# Patient Record
Sex: Male | Born: 1945 | Race: Black or African American | Hispanic: No | Marital: Married | Smoking: Former smoker
Health system: Southern US, Community
[De-identification: ages and names within clinical notes are randomized; demographics above are authoritative.]

## PROBLEM LIST (undated history)

## (undated) DIAGNOSIS — M25562 Pain in left knee: Secondary | ICD-10-CM

## (undated) DIAGNOSIS — G2581 Restless legs syndrome: Secondary | ICD-10-CM

## (undated) DIAGNOSIS — M25512 Pain in left shoulder: Secondary | ICD-10-CM

## (undated) DIAGNOSIS — G629 Polyneuropathy, unspecified: Secondary | ICD-10-CM

## (undated) DIAGNOSIS — I4891 Unspecified atrial fibrillation: Secondary | ICD-10-CM

## (undated) DIAGNOSIS — R5383 Other fatigue: Secondary | ICD-10-CM

## (undated) DIAGNOSIS — E119 Type 2 diabetes mellitus without complications: Secondary | ICD-10-CM

## (undated) DIAGNOSIS — K219 Gastro-esophageal reflux disease without esophagitis: Secondary | ICD-10-CM

## (undated) DIAGNOSIS — I2510 Atherosclerotic heart disease of native coronary artery without angina pectoris: Secondary | ICD-10-CM

## (undated) DIAGNOSIS — I714 Abdominal aortic aneurysm, without rupture, unspecified: Secondary | ICD-10-CM

## (undated) DIAGNOSIS — F4310 Post-traumatic stress disorder, unspecified: Secondary | ICD-10-CM

## (undated) DIAGNOSIS — I739 Peripheral vascular disease, unspecified: Secondary | ICD-10-CM

## (undated) DIAGNOSIS — M1990 Unspecified osteoarthritis, unspecified site: Secondary | ICD-10-CM

## (undated) DIAGNOSIS — D491 Neoplasm of unspecified behavior of respiratory system: Secondary | ICD-10-CM

## (undated) DIAGNOSIS — G4733 Obstructive sleep apnea (adult) (pediatric): Secondary | ICD-10-CM

## (undated) DIAGNOSIS — E7800 Pure hypercholesterolemia, unspecified: Secondary | ICD-10-CM

## (undated) DIAGNOSIS — Z9911 Dependence on respirator [ventilator] status: Secondary | ICD-10-CM

## (undated) DIAGNOSIS — Z87442 Personal history of urinary calculi: Secondary | ICD-10-CM

## (undated) DIAGNOSIS — I771 Stricture of artery: Secondary | ICD-10-CM

## (undated) DIAGNOSIS — I10 Essential (primary) hypertension: Secondary | ICD-10-CM

## (undated) DIAGNOSIS — J45909 Unspecified asthma, uncomplicated: Secondary | ICD-10-CM

## (undated) DIAGNOSIS — J449 Chronic obstructive pulmonary disease, unspecified: Secondary | ICD-10-CM

## (undated) DIAGNOSIS — F17200 Nicotine dependence, unspecified, uncomplicated: Secondary | ICD-10-CM

## (undated) DIAGNOSIS — K08409 Partial loss of teeth, unspecified cause, unspecified class: Secondary | ICD-10-CM

## (undated) HISTORY — DX: Obstructive sleep apnea (adult) (pediatric): G47.33

## (undated) HISTORY — DX: Stricture of artery: I77.1

## (undated) HISTORY — DX: Chronic obstructive pulmonary disease, unspecified: J44.9

## (undated) HISTORY — DX: Unspecified atrial fibrillation: I48.91

## (undated) HISTORY — DX: Abdominal aortic aneurysm, without rupture: I71.4

## (undated) HISTORY — DX: Nicotine dependence, unspecified, uncomplicated: F17.200

## (undated) HISTORY — DX: Neoplasm of unspecified behavior of respiratory system: D49.1

## (undated) HISTORY — DX: Pain in left shoulder: M25.512

## (undated) HISTORY — DX: Type 2 diabetes mellitus without complications: E11.9

## (undated) HISTORY — DX: Abdominal aortic aneurysm, without rupture, unspecified: I71.40

## (undated) HISTORY — DX: Other fatigue: R53.83

## (undated) HISTORY — DX: Pain in left knee: M25.562

## (undated) HISTORY — DX: Unspecified osteoarthritis, unspecified site: M19.90

## (undated) HISTORY — DX: Dependence on respirator (ventilator) status: Z99.11

## (undated) HISTORY — DX: Post-traumatic stress disorder, unspecified: F43.10

## (undated) HISTORY — DX: Polyneuropathy, unspecified: G62.9

## (undated) HISTORY — DX: Atherosclerotic heart disease of native coronary artery without angina pectoris: I25.10

## (undated) HISTORY — DX: Gastro-esophageal reflux disease without esophagitis: K21.9

## (undated) HISTORY — PX: CORONARY ANGIOPLASTY: SHX604

## (undated) HISTORY — DX: Pure hypercholesterolemia, unspecified: E78.00

## (undated) HISTORY — DX: Partial loss of teeth, unspecified cause, unspecified class: K08.409

## (undated) HISTORY — DX: Restless legs syndrome: G25.81

---

## 1998-09-02 ENCOUNTER — Emergency Department (HOSPITAL_COMMUNITY): Admission: EM | Admit: 1998-09-02 | Discharge: 1998-09-02 | Payer: Self-pay | Admitting: Emergency Medicine

## 2000-11-24 ENCOUNTER — Ambulatory Visit (HOSPITAL_COMMUNITY): Admission: RE | Admit: 2000-11-24 | Discharge: 2000-11-24 | Payer: Self-pay | Admitting: Chiropractic Medicine

## 2000-11-24 ENCOUNTER — Encounter: Payer: Self-pay | Admitting: Chiropractic Medicine

## 2001-04-27 ENCOUNTER — Encounter: Payer: Self-pay | Admitting: *Deleted

## 2001-04-27 ENCOUNTER — Ambulatory Visit (HOSPITAL_COMMUNITY): Admission: RE | Admit: 2001-04-27 | Discharge: 2001-04-27 | Payer: Self-pay | Admitting: *Deleted

## 2004-04-24 ENCOUNTER — Emergency Department (HOSPITAL_COMMUNITY): Admission: EM | Admit: 2004-04-24 | Discharge: 2004-04-24 | Payer: Self-pay | Admitting: Emergency Medicine

## 2004-05-11 ENCOUNTER — Emergency Department (HOSPITAL_COMMUNITY): Admission: EM | Admit: 2004-05-11 | Discharge: 2004-05-11 | Payer: Self-pay | Admitting: Emergency Medicine

## 2004-08-23 HISTORY — PX: OTHER SURGICAL HISTORY: SHX169

## 2005-03-29 ENCOUNTER — Emergency Department (HOSPITAL_COMMUNITY): Admission: EM | Admit: 2005-03-29 | Discharge: 2005-03-29 | Payer: Self-pay | Admitting: Family Medicine

## 2005-07-15 ENCOUNTER — Observation Stay (HOSPITAL_COMMUNITY): Admission: EM | Admit: 2005-07-15 | Discharge: 2005-07-17 | Payer: Self-pay | Admitting: Family Medicine

## 2005-07-16 ENCOUNTER — Encounter (INDEPENDENT_AMBULATORY_CARE_PROVIDER_SITE_OTHER): Payer: Self-pay | Admitting: Interventional Cardiology

## 2005-07-19 ENCOUNTER — Observation Stay (HOSPITAL_COMMUNITY): Admission: EM | Admit: 2005-07-19 | Discharge: 2005-07-20 | Payer: Self-pay | Admitting: Emergency Medicine

## 2005-08-03 ENCOUNTER — Emergency Department (HOSPITAL_COMMUNITY): Admission: EM | Admit: 2005-08-03 | Discharge: 2005-08-03 | Payer: Self-pay | Admitting: Emergency Medicine

## 2005-12-14 ENCOUNTER — Encounter: Admission: RE | Admit: 2005-12-14 | Discharge: 2005-12-14 | Payer: Self-pay | Admitting: Family Medicine

## 2006-04-06 ENCOUNTER — Observation Stay (HOSPITAL_COMMUNITY): Admission: EM | Admit: 2006-04-06 | Discharge: 2006-04-07 | Payer: Self-pay | Admitting: Emergency Medicine

## 2006-07-11 ENCOUNTER — Inpatient Hospital Stay (HOSPITAL_BASED_OUTPATIENT_CLINIC_OR_DEPARTMENT_OTHER): Admission: RE | Admit: 2006-07-11 | Discharge: 2006-07-11 | Payer: Self-pay | Admitting: Interventional Cardiology

## 2007-01-26 ENCOUNTER — Emergency Department (HOSPITAL_COMMUNITY): Admission: EM | Admit: 2007-01-26 | Discharge: 2007-01-27 | Payer: Self-pay | Admitting: Emergency Medicine

## 2007-07-14 ENCOUNTER — Inpatient Hospital Stay (HOSPITAL_COMMUNITY): Admission: EM | Admit: 2007-07-14 | Discharge: 2007-07-19 | Payer: Self-pay | Admitting: Emergency Medicine

## 2007-11-04 ENCOUNTER — Emergency Department (HOSPITAL_COMMUNITY): Admission: EM | Admit: 2007-11-04 | Discharge: 2007-11-04 | Payer: Self-pay | Admitting: Emergency Medicine

## 2008-01-21 ENCOUNTER — Ambulatory Visit: Payer: Self-pay | Admitting: *Deleted

## 2008-01-22 ENCOUNTER — Inpatient Hospital Stay (HOSPITAL_COMMUNITY): Admission: EM | Admit: 2008-01-22 | Discharge: 2008-01-22 | Payer: Self-pay | Admitting: Emergency Medicine

## 2008-12-03 ENCOUNTER — Emergency Department (HOSPITAL_COMMUNITY): Admission: EM | Admit: 2008-12-03 | Discharge: 2008-12-03 | Payer: Self-pay | Admitting: Family Medicine

## 2009-03-06 ENCOUNTER — Ambulatory Visit: Payer: Self-pay | Admitting: Cardiology

## 2009-03-06 ENCOUNTER — Inpatient Hospital Stay (HOSPITAL_COMMUNITY): Admission: EM | Admit: 2009-03-06 | Discharge: 2009-03-07 | Payer: Self-pay | Admitting: Emergency Medicine

## 2009-03-11 ENCOUNTER — Emergency Department (HOSPITAL_COMMUNITY): Admission: EM | Admit: 2009-03-11 | Discharge: 2009-03-11 | Payer: Self-pay | Admitting: Emergency Medicine

## 2010-11-29 LAB — DIFFERENTIAL
Basophils Absolute: 0 10*3/uL (ref 0.0–0.1)
Basophils Absolute: 0 10*3/uL (ref 0.0–0.1)
Basophils Absolute: 0 10*3/uL (ref 0.0–0.1)
Basophils Relative: 0 % (ref 0–1)
Eosinophils Absolute: 0 10*3/uL (ref 0.0–0.7)
Eosinophils Absolute: 0.1 10*3/uL (ref 0.0–0.7)
Eosinophils Relative: 0 % (ref 0–5)
Eosinophils Relative: 1 % (ref 0–5)
Lymphocytes Relative: 15 % (ref 12–46)
Lymphocytes Relative: 42 % (ref 12–46)
Monocytes Absolute: 0.1 10*3/uL (ref 0.1–1.0)
Monocytes Absolute: 0.4 10*3/uL (ref 0.1–1.0)
Monocytes Absolute: 0.5 10*3/uL (ref 0.1–1.0)
Monocytes Relative: 1 % — ABNORMAL LOW (ref 3–12)
Monocytes Relative: 7 % (ref 3–12)
Monocytes Relative: 9 % (ref 3–12)
Neutro Abs: 3.2 10*3/uL (ref 1.7–7.7)
Neutro Abs: 5.3 10*3/uL (ref 1.7–7.7)
Neutro Abs: 2.6 10*3/uL (ref 1.7–7.7)
Neutrophils Relative %: 50 % (ref 43–77)

## 2010-11-29 LAB — COMPREHENSIVE METABOLIC PANEL
ALT: 25 U/L (ref 0–53)
Albumin: 3.7 g/dL (ref 3.5–5.2)
Alkaline Phosphatase: 63 U/L (ref 39–117)
Calcium: 9.1 mg/dL (ref 8.4–10.5)
Chloride: 108 mEq/L (ref 96–112)
Creatinine, Ser: 0.99 mg/dL (ref 0.4–1.5)
GFR calc non Af Amer: >60 mL/min (ref >60)
Glucose, Bld: 198 mg/dL — ABNORMAL HIGH (ref 70–99)
Sodium: 138 mEq/L (ref 135–145)
Total Bilirubin: 0.6 mg/dL (ref 0.3–1.2)

## 2010-11-29 LAB — BASIC METABOLIC PANEL
CO2: 27 mEq/L (ref 19–32)
Calcium: 8.7 mg/dL (ref 8.4–10.5)
Calcium: 9.2 mg/dL (ref 8.4–10.5)
Chloride: 108 mEq/L (ref 96–112)
Creatinine, Ser: 0.97 mg/dL (ref 0.4–1.5)
GFR calc Af Amer: >60 mL/min (ref >60)
Glucose, Bld: 161 mg/dL — ABNORMAL HIGH (ref 70–99)
Glucose, Bld: 111 mg/dL — ABNORMAL HIGH (ref 70–99)
Potassium: 3.3 mEq/L — ABNORMAL LOW (ref 3.5–5.1)
Sodium: 141 mEq/L (ref 135–145)

## 2010-11-29 LAB — CBC
HCT: 41 % (ref 39.0–52.0)
HCT: 40.9 % (ref 39.0–52.0)
Hemoglobin: 13.8 g/dL (ref 13.0–17.0)
MCHC: 33.6 g/dL (ref 30.0–36.0)
MCHC: 33.6 g/dL (ref 30.0–36.0)
MCV: 90.1 fL (ref 78.0–100.0)
RBC: 4.57 MIL/uL (ref 4.22–5.81)
RBC: 4.54 MIL/uL (ref 4.22–5.81)
RDW: 14.6 % (ref 11.5–15.5)
RDW: 14.4 % (ref 11.5–15.5)
WBC: 6.3 10*3/uL (ref 4.0–10.5)

## 2010-11-29 LAB — D-DIMER, QUANTITATIVE: D-Dimer, Quant: <0.22 ug/mL-FEU (ref 0.00–0.48)

## 2010-11-29 LAB — PROTIME-INR
INR: 1 (ref 0.00–1.49)
Prothrombin Time: 13.7 seconds (ref 11.6–15.2)

## 2010-11-29 LAB — CK TOTAL AND CKMB (NOT AT ARMC)
CK, MB: 1.4 ng/mL (ref 0.3–4.0)
CK, MB: 1.9 ng/mL (ref 0.3–4.0)
Relative Index: 0.9 (ref 0.0–2.5)
Relative Index: 1.2 (ref 0.0–2.5)
Relative Index: 1 (ref 0.0–2.5)
Total CK: 162 U/L (ref 7–232)
Total CK: 199 U/L (ref 7–232)

## 2010-11-29 LAB — TROPONIN I
Troponin I: 0.01 ng/mL (ref 0.00–0.06)
Troponin I: 0.01 ng/mL (ref 0.00–0.06)

## 2010-11-29 LAB — POCT CARDIAC MARKERS
CKMB, poc: <1 ng/mL — ABNORMAL LOW (ref 1.0–8.0)
CKMB, poc: <1 ng/mL — ABNORMAL LOW (ref 1.0–8.0)
Myoglobin, poc: 37.3 ng/mL (ref 12–200)
Myoglobin, poc: 39.4 ng/mL (ref 12–200)
Troponin i, poc: <0.05 ng/mL (ref 0.00–0.09)
Troponin i, poc: <0.05 ng/mL (ref 0.00–0.09)

## 2010-11-29 LAB — MAGNESIUM: Magnesium: 2 mg/dL (ref 1.5–2.5)
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
## 2011-01-05 NOTE — Discharge Summary (Signed)
[name], [name]               ACCOUNT NO.:  [id number]   MEDICAL RECORD NO.:  [id number]          PATIENT TYPE:  INP   LOCATION:  3735                         FACILITY:  [facility]   PHYSICIAN:  Jake Bathe, MD      DATE OF BIRTH:  17-May-1946   DATE OF ADMISSION:  03/06/2009  DATE OF DISCHARGE:  03/07/2009                               DISCHARGE SUMMARY   FINAL DIAGNOSES:  1. Chest pain - coronary artery disease with previously placed right      coronary artery stents widely patent demonstrated by cardiac      catheterization.  He did have ostial left main stenosis which was      demonstrated on prior cardiac catheterization which was relieved      moderately by intravenous nitroglycerin.  These findings were      discussed with Dr. Verdis Prime.  Intracoronary vascular ultrasound      was not felt to be necessary at this time due to adequate images      demonstrating patent ostium.  2. Diabetes mellitus - resume metformin 48 hours.  3. Gastroesophageal reflux disease.  4. Asthma.  5. Posttraumatic stress disorder.   BRIEF HOSPITAL COURSE:  A 65 year old male with hyperlipidemia, coronary  artery disease status post right coronary artery drug-eluting stent  Cypher 3.0 x 8 as well as 2.5 x 28 in 2006 by Dr. Lance Muss with  subsequent cardiac catheterization in 2007 demonstrating patent stents  who has had 2 nuclear stress tests then, one at the [facility], one at Dr.  Hoyle Barr office, both reassuring per the patient, who is here with  epigastric substernal chest pain which has been occurring over the past  few weeks with occasional radiation to his neck.  Please see history and  physical for further details.  He has had no change with nitroglycerin.  His cardiac biomarkers were normal overnight.  His EKG showed T-wave  inversion in lead III and flattening in aVF, otherwise unremarkable.  Hemoglobin 13.  Potassium was 3.3 on morning of discharge and potassium  supplementation was  administered.  Creatinine 0.9.  BNP less than 30.  D-  dimer less than 0.22.  Chest x-ray was within normal limits personally  viewed.   PHYSICAL EXAMINATION:  VITAL SIGNS:  Blood pressure 126/88, sating well  on room air.  CARDIOVASCULAR:  Regular rate and rhythm with no murmurs, rubs, or  gallops.  LUNGS:  Clear to auscultation bilaterally.  ABDOMEN:  Nontender.  Positive bowel sounds.  Extremities:  No clubbing, cyanosis, or edema.   DISCHARGE MEDICATIONS:  1. Aspirin 81 mg a day.  2. Plavix 75 mg a day.  3. Simvastatin 80 mg a day.  4. Omeprazole 20 mg a day.  5. Claritin 10 mg once a day.  6. Multivitamin once a day.  7. Piroxicam 20 mg once a day.  8. Albuterol MDI puffs as needed.  9. Mirtazapine 15 mg at bedtime.  10.Nitroglycerin 0.4 mg sublingual as needed.  11.Clotrimazole to feet as needed.  12.Metformin 1000 mg twice a day, resume in 2 days.  13.New  medication is Imdur 30 mg once a day.  Prescription has been      given.   FOLLOWUP:  He has followup with Dr. Lance Muss on March 21, 2009,  at 10:00 a.m.  Postcatheterization instructions have been administered  with no heavy lifting for 5 days and no driving for 3 days.  He knows to  increase his activity slowly.  If he does experience any fevers, chills,  or other worrisome symptoms such as bleeding, he knows to contact us  immediately or call 911.  Once ambulatory postcatheterization and  stable, he should be able to be discharged home safely.      Jake Bathe, MD  Electronically Signed     MCS/MEDQ  D:  03/07/2009  T:  03/07/2009  Job:  [id number]   cc:   Corky Crafts, MD

## 2011-01-05 NOTE — Cardiovascular Report (Signed)
NAMECOLTEN, [name]               ACCOUNT NO.:  [id number]   MEDICAL RECORD NO.:  [id number]          PATIENT TYPE:  INP   LOCATION:  3735                         FACILITY:  [facility]   PHYSICIAN:  Jake Bathe, MD      DATE OF BIRTH:  1946-04-24   DATE OF PROCEDURE:  03/06/2009  DATE OF DISCHARGE:  03/07/2009                            CARDIAC CATHETERIZATION   PROCEDURES:  1. Left heart catheterization.  2. Selective coronary angiography.  3. Left ventriculogram.   INDICATIONS:  A 65 year old male with coronary artery disease status  post Cypher stent in 2006 to the right coronary artery here with  symptoms consistent with unstable angina, progressive chest pain, and  shortness of breath.  However, he does have some atypical features to  his history, please see HPI for further details.  He has had 2 prior  nuclear stress tests postcatheterization, the last he believes in  September which showed no evidence of ischemia.  He is ruled out by  cardiac enzymes overnight.  ECG shows T-wave inversion in lead III and  flattening in aVF, otherwise unremarkable.   Prior cardiac catheterization demonstrated tapering of the left main  ostium, but otherwise good blowback without any ventricularization.   PROCEDURE DETAILS:  Details informed consent was obtained.  Risk of  stroke, heart attack, death, renal impairment, and arterial damage were  explained to the patient at length.  Visualization of the femoral head  was obtained via fluoroscopy.  Lidocaine 1% was used for local  anesthetic.  Using the modified Seldinger technique, a 6-French sheath  was placed into the right femoral artery.  Judkins left #4 catheter was  used to selectively cannulate the left main artery.  A Judkins right #4  catheter was used to selectively cannulate the right coronary artery.  Angled pigtail was used to selectively enter the left ventricle.  Left  ventriculogram in the RAO position utilizing 30 mL of contrast  was  obtained.  After the initial shot of the left main artery showing  tapering, 200 mcg of IC nitroglycerin was administered.  Following  procedure, ACT was drawn, sheaths were pulled, manual compression held.  The patient tolerated the procedure well without any difficulty.   FINDINGS:  1. Left main artery - the initial shot showed significant tapering of      the ostium of the left main artery with very minimal blowback of      contrast and also mild ventricularization as well as dampening of      the catheter.  At that point, intracoronary nitroglycerin was      administered.  The subsequent shots did show improvement of the      left main ostium.  There is likely stenosis up to 30% in the left      main ostium.  Otherwise, it branches into the left anterior      descending artery and circumflex artery.  2. Left anterior descending artery - there are minor irregularities      throughout this division with 1 large diagonal branch.  Otherwise,      no  flow-limiting coronary artery disease present.  3. Circumflex artery - minor irregularities throughout this vessel, 2      large obtuse marginal branches.  No angiographically significant      coronary artery disease.  4. Right coronary artery - previously placed Cypher drug-eluting      stents are widely patent.  Dominant vessel giving rise to PDA.  No      acute abnormalities.  In the mid section of the stents, the acute      marginal branch in that territory is jailed.  No significant change      from prior cardiac catheterization.   Left ventriculogram:  Ejection fraction is 55%.  No wall motion  abnormalities noted.  No mitral regurgitation.   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  I did not investigate that on this exam.   IMPRESSION:  1. Ostial left main stenosis up to 30%.  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  Improved with intracoronary       nitroglycerin.  This was seen on prior cardiac catheterization.      May be slightly worse than prior cardiac catheterization in 2007.      These findings were discussed with Dr. Katrinka Blazing and after visualizing      several different views the left main ostium did appear to be      patent with adequate contrast blowback into the aorta.  No      intracoronary vascular ultrasound was performed.  Prior nuclear      stress tests showed no evidence of any significant ischemia.  2. Previously placed Cypher stents are widely patent in the right      coronary artery.  3. Normal left ventricular ejection fraction of 55% with no wall      motion abnormalities.  No mitral regurgitation.  No aortic      stenosis.  4. 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  One could consider abdominal ultrasound for      further monitoring.   PLAN:  With his symptoms and catheter-induced vasospasm demonstrated, I  will start isosorbide mononitrate 30 mg once a day will be new to his  drug regimen.  Continue with current medications including simvastatin  80 mg for hyperlipidemia.  In regards to his diabetes, please hold  metformin for 48 hours, then resume.  I have scheduled him a followup  with Dr. Lance Muss.      Jake Bathe, MD  Electronically Signed     MCS/MEDQ  D:  03/07/2009  T:  03/07/2009  Job:  [id number]   cc:   [facility]

## 2011-01-05 NOTE — Discharge Summary (Signed)
[name], [name]               ACCOUNT NO.:  [id number]   MEDICAL RECORD NO.:  [id number]          PATIENT TYPE:  INP   LOCATION:  3735                         FACILITY:  [facility]   PHYSICIAN:  Corky Crafts, MDDATE OF BIRTH:  1945-09-28   DATE OF ADMISSION:  07/14/2007  DATE OF DISCHARGE:  07/19/2007                               DISCHARGE SUMMARY   DISCHARGE DIAGNOSES:  1. Upper respiratory infection, probable pneumonia.  2. Diabetes mellitus.  3. Known coronary artery disease.  4. History of asthma.  5. Allergy to ERYTHROMYCIN.  6. Long-term medication use.   HOSPITAL COURSE:  [name] is a 65 year old male patient with a known  history of coronary artery disease who received stenting in the past to  the right coronary artery.  He came in on the day of admission  complaining of chest tightness and shortness of breath as well as  wheezing.  Initially he says that the pain was similar to his episodes  of angina in the past.  However, later during his hospitalization he did  not feel that it was similar at all.  He had subsequent chest x-rays  during this time period that did not show any specific infiltrate.  Because of his history of asthma and smoking he required hospitalization  for acute management.  He was treated with Avelox, steroid taper, and  nebulizers.  By July 19, 2007, the patient was felt to be ready for  discharge to home.   DISCHARGE INSTRUCTIONS:  1. No smoking.  2. Activity as tolerated.  3. Follow up with Tillman Sers, nurse practitioner for Dr. Eldridge Dace,      August 03, 2007 at 9:20 a.m. for lung recheck.   MEDICATIONS:  1. Metformin 2000 mg daily.  2. Ventolin p.r.n.  3. Advair discus daily.  4. Albuterol p.r.n.  5. Plavix 75 mg a day.  6. Baby aspirin daily.  7. Claritin 10 mg daily.  8. Omeprazole 20 mg daily.  9. Zoloft 100 mg a day.  10.Simvastatin 80 mg a day.  11.Vardenafil 20 mg daily p.r.n.  12.Neurontin 300 mg q.h.s.   His new medications are:  1. Avelox 400 mg one tablet daily for five additional days.  2. He is also on a prednisone taper and for the rest of his taper once      he goes home, he is to take two 10-mg tablets for 2 days, then one      tablet a day for 2 days and then stop.   Of note, the patient's wife has also been hospitalized for the same  reason and will be going home today as well.      Guy Franco, P.A.      Corky Crafts, MD  Electronically Signed    LB/MEDQ  D:  07/19/2007  T:  07/19/2007  Job:  [id number]

## 2011-01-05 NOTE — H&P (Signed)
[name], [name]               ACCOUNT NO.:  [id number]   MEDICAL RECORD NO.:  [id number]          PATIENT TYPE:  INP   LOCATION:  1825                         FACILITY:  [facility]   PHYSICIAN:  Unice Cobble, MD     DATE OF BIRTH:  1946-06-11   DATE OF ADMISSION:  01/21/2008  DATE OF DISCHARGE:                              HISTORY & PHYSICAL   PRIMARY CARDIOLOGIST:  Corky Crafts, M.D.   CHIEF COMPLAINT:  Chest pain.   HISTORY OF PRESENT ILLNESS:  This is a 65 year old African American male  with a history of asthma, diabetes, hyperlipidemia, and coronary artery  disease, status post PCI to the RCA who presents with chest pain.  The  patient has had chest pain off and on since Friday.  He tells me that  the pain lasts for 1-2 hours and then goes away.  It is 7/10 and  substernal in nature, radiating to his right shoulder and neck,  associated with shortness of breath.  He denies palpitations,  presyncope, diaphoresis, nausea, and vomiting.  It is not similar to the  asthma symptoms which brought him in earlier this year, but is similar  to chest pain pre-PCI with the exception of a lack of diaphoresis.  He  has not tried anything for it.  He also incidentally notes that he has  had a lot of burping over the weekend.   PAST MEDICAL HISTORY:  1. Coronary artery disease status, post PCI to RCA.  Last      catheterization was November 2007 which showed widely patent stent      with no significant coronary artery disease.  Ejection fraction was      55%.  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  2. Asthma.  3. Diabetes mellitus.  4. Hyperlipidemia.  5. PTSD   ALLERGIES:  ERYTHROMYCIN.   MEDICATIONS:  1. Mirtazapine 15 mg at bedtime.  2. Nitroglycerin sublingual p.r.n.  3. Metformin 1000 mg b.i.d.  4. Albuterol p.r.n.  5. Plavix 75 mg daily.  6. Loratadine at 10 mg daily.  7. Omeprazole 20 mg daily.  8. Sertraline 100 mg daily.  9. Simvastatin 80 mg daily.  10.Ammonium Lactate for feet.   SOCIAL HISTORY:  He lives in [place] with his wife.  He is disabled  from his diabetes and PTSD.  He smokes 5 to 6 cigars a week.  He drinks  gin 2 to 3 times per week.  Occasional marijuana.   FAMILY HISTORY:  His mother had coronary artery disease.   REVIEW OF SYSTEMS:  All other systems reviewed and found to be negative  except as mentioned in the HPI.   PHYSICAL EXAMINATION:  VITAL SIGNS:  Temperature is 98.4 with a pulse of  52, respiratory rate is 16, blood pressure 132/83, oxygen saturation 98%  on 2 liters.  GENERAL:  This is a thin African American male in no acute distress.  HEENT:  Normocephalic atraumatic, PERRLA, EOMI, MMM, good dentition.  Oropharynx without erythema or exudates.  NECK:  Supple without lymphadenopathy, thyromegaly, bruits or jugular  venous distention.  HEART:  Regular rate and rhythm with a normal S1 and S2.  No murmurs,  gallops or rubs.  Normal PMI.  Pulses 2+ and equal bilaterally without  bruits.  LUNGS:  Some mild wheezes throughout, but otherwise clear.  SKIN:  No rashes or lesions.  ABDOMEN:  Soft and nontender with normal bowel sounds.  No rebound,  guarding.  No hepatosplenomegaly.  EXTREMITIES:  No cyanosis, clubbing or edema.  MUSCULOSKELETAL:  No joint deformity, effusions or spine or CVA  tenderness.  NEUROLOGICALLY:  He is alert and oriented x3 with cranial nerves II-XII  grossly intact and strength is 5 out of 5 in all extremities and axial  groups.  Normal sensation throughout.  Normal cerebellar function.   LABORATORY DATA:  X-ray of the chest shows no acute cardiopulmonary  disease.  EKG shows sinus bradycardia with rate of 49.  He has  nonspecific T-wave changes.  His labs are unremarkable except for a  glucose of 155.  His CK-MB and troponin are nondetectable.   ASSESSMENT/PLAN:  This is 65 year old African American male with a  history of coronary artery disease and asthma who presents with  atypical  chest pain.  The pain is in the setting of diabetes, however, and could  be an anginal variant.  He will need admission to rule out myocardial  infarction and either stress or catheterization tomorrow at the  attending's discretion.  I will also try a GI cocktail tonight as  nitroglycerin has had no effect in the emergency department.  Of note,  the patient was told to stop his aspirin by [facility].  This has  been reimplemented and explained to the patient.  Smoking cessation has  been encouraged.      Unice Cobble, MD  Electronically Signed     ACJ/MEDQ  D:  01/21/2008  T:  01/22/2008  Job:  [id number]

## 2011-01-05 NOTE — H&P (Signed)
[name], [name]               ACCOUNT NO.:  [id number]   MEDICAL RECORD NO.:  [id number]          PATIENT TYPE:  INP   LOCATION:  3735                         FACILITY:  [facility]   PHYSICIAN:  Vesta Mixer, M.D. DATE OF BIRTH:  05-Feb-1946   DATE OF ADMISSION:  07/14/2007  DATE OF DISCHARGE:                              HISTORY & PHYSICAL   [name] is a 65 year old black gentleman with a history of asthma,  coronary artery disease and diabetes mellitus.  He is admitted to the  hospital with worsening chest tightness and dyspnea.   The patient has a long history of asthma.  He has had a bad cold over  the past week or so.  He has had lots of coughing.  Early this morning  he noted marked worsening of his chest tightness and presented to the  emergency room.  He was noted to have significant dyspnea as well as  wheezing.  He states that these pains are somewhat similar to his  episodes of angina before he had a stent.  The angina pain was somewhat  more severe but he does think it is similar.   CURRENT MEDICATIONS:  1. Metformin 1000 mg p.o. b.i.d.  2. Plavix 75 mg a day.  3. Omeprazole 20 mg q.h.s.  4. Simvastatin 80 mg a day.  5. Sertraline 100 mg a day.  6. Aspirin 81 mg a day.  7. Claritin 10 mg a day.   ALLERGIES:  He is allergic to ERYTHROMYCIN.   PAST MEDICAL HISTORY:  1. History of coronary artery disease.  He is status post right      coronary artery stenting.  2. History of asthma.  3. Diabetes mellitus.   SOCIAL HISTORY:  The patient smokes 4-5 cigars in a week.  He drinks  alcohol occasionally.   FAMILY HISTORY:  His mother had coronary artery disease.   REVIEW OF SYSTEMS:  He has had a recent cold and cough symptoms.   EXAM:  He is a middle-aged black gentleman in mild respiratory distress.  His blood pressure is 117/73 with a heart rate of 76.  HEENT:  2+ carotids he has no bruits, no JVD, and no thyromegaly.  LUNGS:  Tight bilateral wheezing.  HEART:  Regular rate, S1-S2.  ABDOMEN:  Good bowel sounds, it is nontender.  EXTREMITIES:  No clubbing, cyanosis or edema.  NEUROLOGIC:  Nonfocal.   EKG reveals normal sinus rhythm.  It has no ST or T-wave changes.   LABORATORY DATA:  Unremarkable.  His cardiac enzymes are negative.  His  hemoglobin is 15.6 with a hematocrit of 47.5%.   His chest x-ray reveals clear lungs.   IMPRESSION AND PLAN:  1. Dyspnea.  This is most likely due to asthma and bronchitis.  We      will give him nebulizers.  We will start him on some antibiotics      and give him Tussionex.  I suspect that this will get better in      several days.  We may need to add prednisone.  At this point I do  not really think that he has pneumonia.  2. History of coronary artery disease.  It is very unlikely that this      represents an acute coronary syndrome.  His cardiac enzymes are      negative despite having shortness of breath and chest tightness for      a week.  We will get cardiac enzymes and check his EKG in the      morning.  3. Diabetes mellitus.  These appear to be fairly stable.  We will CBGs      and place him on a sliding scale as needed.           ______________________________  Vesta Mixer, M.D.     PJN/MEDQ  D:  07/14/2007  T:  07/15/2007  Job:  [id number]   cc:   Corky Crafts, MD  Jethro Bastos, M.D.  [facility]

## 2011-01-08 NOTE — Cardiovascular Report (Signed)
[name], [name]               ACCOUNT NO.:  [id number]   MEDICAL RECORD NO.:  [id number]          PATIENT TYPE:  INP   LOCATION:  6525                         FACILITY:  [facility]   PHYSICIAN:  Corky Crafts, MDDATE OF BIRTH:  July 22, 1946   DATE OF PROCEDURE:  07/16/2005  DATE OF DISCHARGE:                              CARDIAC CATHETERIZATION   REFERRING PHYSICIAN:  Theone Stanley, M.D.   PRIMARY CARDIOLOGIST:  Armanda Magic, M.D.   PROCEDURES PERFORMED:  1.  Left heart catheterization.  2.  Coronary angiogram.  3.  Percutaneous coronary intervention of right coronary artery.   OPERATOR:  Corky Crafts, M.D.   INDICATIONS:  Unstable angina.   DESCRIPTION OF PROCEDURE:  1.  Right femoral artery access:  The skin was infiltrated with 1%      lidocaine.  Arterial access was obtained using the modified Seldinger      technique.  2.  Left coronary artery angiography:  A catheter was advanced to the      ascending aorta and positioned in the vessel origin under fluoroscopic      guidance.  Digital angiography was performed in multiple projections      using hand injection of contrast.  3.  Right coronary artery angiography:  The catheter was advanced to the      ascending aorta and positioned in the vessel origin under fluoroscopic      guidance.  Digital angiography was performed in multiple projections      using hand injection of contrast.  4.  PCI of right coronary artery:  See details below.  5.  Left heart catheterization:  A pigtail catheter was advanced to the      ascending aorta.  The catheter was advanced across the aortic valve.      Pressure was recorded in the aorta and left ventricle.  Ventriculography      was not performed because of the amount of contrast administered during      the diagnostic procedure and intervention.  The catheter was gradually      withdrawn into the aorta under continuous pressure monitoring, and the      aortic pressure was  recorded.  6.  Arterial hemostasis was obtained by deployment of Angio-Seal.   FINDINGS:  1.  The left main coronary was widely patent.  There were minor luminal      irregularities but no hemodynamically significant stenosis.  2.  The circumflex is a large, codominant vessel with luminal      irregularities.  There is a large branching obtuse marginal #1 with      luminal irregularities.  3.  The left anterior descending artery was a large vessel which showed      minor luminal irregularities in the proximal and midportion.  More      distally there was diffuse mild to moderate atherosclerosis noted.      There is a small first diagonal, which arose from the left anterior      descending, with luminal irregularities.  There is a medium-sized second      diagonal  with luminal irregularities.  There is a small third diagonal      as well with luminal irregularities.  4.  The right coronary artery was a large, codominant vessel.  There was a      long area of atherosclerosis with narrowing up to 95%.  This lesion      began at the origin of a large RV marginal branch.   PERCUTANEOUS INTERVENTION:  The right coronary artery was engaged with a JR4  guide.  An Office manager was used to cross the lesion.  The distal  portion of the lesion was predilated with a 2.0 x 20 Maverick balloon  inflated to 10 atmospheres for 23 seconds.  A 2.5 x 28 mm Cypher stent was  then deployed at 14 atmospheres for 25 seconds.  After multiple projections  were taken, a stenosis at the proximal edge of the stent was noted in the  mid-right coronary artery.  This did not resolve with intracoronary  nitroglycerin.  A 3.0 x 8 mm Cypher stent was then deployed in overlapping  fashion at 14 atmospheres for 22 seconds with a good angiographic result.  The overlapping stent area was post-dilated using the stent balloon at 12  atmospheres for eight seconds.  There was an excellent angiographic  appearance.   TIMI-3 flow was present in both the right coronary artery as  well as the large RV marginal branch.  There was a 0% residual stenosis in  the mid-right coronary artery.  No significant stenosis was noted at the  origin of the large RV marginal branch despite the 3.0 x 8 mm Cypher being  deployed across the origin of this vessel.   HEMODYNAMICS:  1.  The left ventricular end-diastolic pressure was 17 mmHg.  2.  The left ventricular pressure was 159/10.  3.  The aortic pressure on pullback was 159/92 with a mean aortic pressure      of 118.   IMPRESSION:  1.  Significant coronary artery disease of the right coronary artery.  Mild      luminal irregularities in the left system.  2.  Successful drug-eluting stent placement in the mid-right coronary artery      proximally.  A 3.0 x 8 mm Cypher stent was placed, deployed at 14      atmospheres.  Overlapping the distal edge of this stent, a 2.5 x 28 mm      Cypher stent was deployed at 14 atmospheres with TIMI-3 flow and no      residual stenosis.  3.  An Angio-Seal was deployed in his right groin because of significant      back pain during the procedure.  4.  No evidence of an aortic valve gradient.  5.  No apparent complications.   RECOMMENDATIONS:  1.  The patient should continue aspirin 325 mg p.o. daily and Plavix 75 mg      p.o. daily for at least a year.  Other secondary prevention with lipid-      lowering therapy and smoking cessation is also recommended.  The patient      will not tolerate a beta blocker because of his slow heart rates at      baseline.  We will also try to start an ACE inhibitor.  2.  The patient will follow up with Dr. Mayford Knife in the cardiology office.           ______________________________  Corky Crafts, MD     JSV/MEDQ  D:  07/16/2005  T:  07/16/2005  Job:  [id number]

## 2011-01-08 NOTE — Consult Note (Signed)
[name], [name]               ACCOUNT NO.:  [id number]   MEDICAL RECORD NO.:  [id number]          PATIENT TYPE:  EMS   LOCATION:  MAJO                         FACILITY:  [facility]   PHYSICIAN:  Corky Crafts, MDDATE OF BIRTH:  [birth year]   DATE OF CONSULTATION:  08/03/2005  DATE OF DISCHARGE:  08/03/2005                                   CONSULTATION   REFERRING PHYSICIAN:  Trudi Ida. Denton Lank, M.D.   REASON FOR CONSULTATION:  Chest pain.   HISTORY OF PRESENT ILLNESS:  Mr. [name] is a 65 year old gentleman who was  admitted in late November with unstable angina. At that time, he underwent  cardiac catheterization and had 2 stents placed in his right coronary  artery. He had an uneventful 2 day hospital stay and was discharged the next  day. He returned to the hospital 5 days later with complaints of sharp left-  sided chest pain. The pain was worse with inspiration. He was observed  overnight and ruled out for myocardial infarction. His EKG was normal at  that time. Since that time, I have seen him in the office on 1 occasion. He  continued to have a nagging left-sided chest pain, related mostly to  inspiration. He has been exerting himself normally. He has walked a fair  bit, going up and down hills without any problems.   Yesterday, he had more of the nagging left-sided chest pain. He states that  he also had some indigestion and took some Pepcid. After taking the Pepcid,  he began to belch and the belching relieved his pain somewhat. He did feel  some sharp pains in his left arm as well, which lasted for only a few  seconds. Currently, he is pain free. His only other complaint is that he has  been quite constipated since being discharged from the hospital. He was  going to try magnesium citrate laxative. He feels that his problems are more  related to constipation than any type of heart pain. He also notes that this  pain that he has had over the last few days does not feel  like what he had  prior to his stent implantation, nor does it feel like the pain he had  during the cath procedure while the angioplasty was being done.   PAST MEDICAL HISTORY:  1.  Coronary artery disease status post stent implantation to the RCA.  2.  Diabetes.  3.  Hyperlipidemia.  4.  Posttraumatic stress disorder. He is being followed at the [facility].   ALLERGIES:  ERYTHROMYCIN.   CURRENT MEDICATIONS:  Include:  1.  Enteric coated aspirin 325 mg p.o. daily.  2.  Metformin 1 gram b.i.d.  3.  Plavix 75 mg p.o. daily.  4.  Zetia 10 mg p.o. daily.  5.  Claritin 10 mg q.h.s.  6.  Lisinopril 5 mg once a day.  7.  Elavil.  No beta blocker was used because of bradycardia.   SOCIAL HISTORY:  The patient lives with his wife. He occasionally smokes  cigars. He does not drink alcohol heavily, only occasionally. He does not  use any illegal drugs. He is retired from [employer]. He works as a Designer, multimedia and works Engineering geologist as well.   FAMILY HISTORY:  Father died of an MI at age 64. Mother had an MI at an  older age.   REVIEW OF SYSTEMS:  No recent fevers or chills. No weight loss. Chest pain  as described above. No associated shortness of breath. No associated nausea.  No diaphoresis.  No focal weakness. All other systems negative.   PHYSICAL EXAMINATION:  VITAL SIGNS:  Blood pressure 131/85, pulse 58.  GENERAL:  The patient is awake and alert. No acute distress.  HEENT:  Head normocephalic and atraumatic.  Eyes:  Extraocular muscles  intact. Mouth, oropharynx clear.  NECK:  Supple. No JVD. No carotid bruits.  CARDIOVASCULAR:  Bradycardiac, S1 and S2.  LUNGS:  Clear to auscultation bilaterally.  ABDOMEN:  Soft, nontender, and nondistended.  EXTREMITIES:  No edema. Palpable peripheral pulses.  NEUROLOGIC:  No focal deficits.  CHEST WALL:  Mild pain to palpation of the left side of the chest.   LABORATORY DATA:  Hematocrit of 49, potassium 4, creatinine of 1.2.  Troponin  less than 0.05, myoglobin was 54.9. CK-MB less than 1. The second  troponin was also less than 0.05.   ECG shows sinus bradycardia, no pathologic Q waves, early repolarization  noted, no pathologic ST-T wave changes.   MEDICAL DECISION MAKING:  1.  CARDIAC:  I had a long discussion with the patient and his wife. His      symptoms are atypical, however, I did explain to them that this could      represent cardiac pain, although I think it is fairly unlikely. I      offered to observe him overnight and perform a cardiac catheterization      in the morning to make sure that the recently placed stent is okay. He      decided not to stay in the hospital. He also feels that this is not his      heart and is more likely gastrointestinal-related pain. He is going to      go home and try a laxative along with Pepcid to hopefully relieve these      symptoms.  2.  Would continue metformin for his diabetes.  3.  Continue his Zetia and Zocor for hyperlipidemia.  4.  If he has any further pain that will not go away, he will call the      office or return to the emergency room, depending on the time of day.  5.  He also assures me that he is taking his aspirin and Plavix properly. I      did explain to him that that is what will keep his stent from clotting      off.  6.  I will see the patient back in the office. If he continues to have pain,      I would plan for a diagnostic catheterization in the future.           ______________________________  Corky Crafts, MD     JSV/MEDQ  D:  08/03/2005  T:  08/04/2005  Job:  [id number]

## 2011-01-08 NOTE — Discharge Summary (Signed)
[name], [name]               ACCOUNT NO.:  [id number]   MEDICAL RECORD NO.:  [id number]          PATIENT TYPE:  INP   LOCATION:  3729                         FACILITY:  [facility]   PHYSICIAN:  Corky Crafts, MDDATE OF BIRTH:  10-19-1945   DATE OF ADMISSION:  01/22/2008  DATE OF DISCHARGE:  01/22/2008                               DISCHARGE SUMMARY   DISCHARGE DIAGNOSES:  1. Chest pain.  2. Known coronary artery disease.  3. History of percutaneous coronary intervention to the right coronary      artery.  4. Diabetes mellitus.  5. Hyperlipidemia.  6. Asthma.   HOSPITAL COURSE:  [name] is a 65 year old male patient who came  into the hospital early morning on January 22, 2008.  He has known coronary  artery disease in the past.  He has had a percutaneous intervention to  the right coronary artery cath in November 2007 showed widely patent  stent with no other significant disease.  EF 55%.  There was a  documented abdominal aortic aneurysm at 3.8 cm.  The patient remained in  the hospital over the next several hours.   LABORATORY DATA:  Laboratory studies showed that he ruled out for  myocardial infarction.  Total cholesterol was 102, HDL 42, LDL of 60,  triglycerides 49, sodium 140, potassium 3.5, BUN 12, and creatinine  1.05.  Hemoglobin 14.1, hematocrit 41.5, white count 5.7, and platelets  150.   Dr. Eldridge Dace feels it is possible that the patient can have Cardiolite  performed as an outpatient.  We deal with the patient go home.  He is to  follow up on February 12, 2008 at 2:45 p.m. with Dr. Eldridge Dace.  He is to  remain on low-sodium, heart-healthy diabetic diet.  Increase activity  slowly.   DISCHARGE MEDICATIONS:  He is to maintain the following medications.  1. Remeron 15 mg q.h.s. p.r.n.  2. Metformin 1000 mg twice daily.  3. Albuterol p.r.n.  4. Plavix 75 mg daily.  5. Claritin 10 mg daily.  6. Omeprazole 20 mg daily.  7. Zoloft 100 mg daily.  8. Simvastatin  80 mg daily.  9. Sublingual nitroglycerin p.r.n. chest pain.  10.Baby aspirin 81 mg daily.   The office will call for the abdominal ultrasound appointment to repeat  this to make sure his abdominal aortic aneurysm has not grown in size.      Guy Franco, P.A.       Corky Crafts, MD  Electronically Signed    LB/MEDQ  D:  02/14/2008  T:  02/15/2008  Job:  [id number]

## 2011-01-08 NOTE — H&P (Signed)
[name], [name]               ACCOUNT NO.:  [id number]   MEDICAL RECORD NO.:  [id number]          PATIENT TYPE:  INP   LOCATION:  1824                         FACILITY:  [facility]   PHYSICIAN:  Corky Crafts, MDDATE OF BIRTH:  28-Sep-1945   DATE OF ADMISSION:  07/19/2005  DATE OF DISCHARGE:                                HISTORY & PHYSICAL   CHIEF COMPLAINT:  Chest pain.   HISTORY OF PRESENT ILLNESS:  [name] is a 65 year old male patient  admitted July 14, 2005, by [facility] with complaint of 2-week  history of chest pain.  Subsequent cardiac enzymes during that admission  were negative.  He underwent a left heart catheterization with subsequent  percutaneous coronary intervention and Cypher stent implantation to the RCA.  There was noted to be diffuse residual disease described as moderate in the  distal LAD, otherwise luminal irregularities throughout the rest of the  coronary system.  His LVEF was documented as normal.   Today the patient called Dr. Hoyle Barr office, spoke with his nurse,  complaining of prolonged waxing and waning chest pain that began Sunday  evening, has not really resolved.  There were no associated symptoms, no  radiation of the pain.  On exam, Dr. Eldridge Dace was able to reproduce the pain  with palpation of the left anterior chest wall.  Point-of-care enzymes are  pending.  His initial EKG is negative without acute changes.   REVIEW OF SYSTEMS:  The patient has had no nausea or vomiting, diarrhea,  cough, fevers, chills, no abdominal pain, no dark or bloody stools, no lower  extremity swelling, no palpitations, no dizziness, no syncope or near  syncope.   SOCIAL HISTORY:  The patient is married.  His wife accompanies him on  today's visit.  He smokes cigars occasionally and has used marijuana in the  past, not recently.  Has social alcohol.  Does not use illegal drugs.  He is  retired from [employer], works as a Scientist, physiological and  works Engineering geologist as  well.   FAMILY MEDICAL HISTORY:  Father died at age of 36 from MI.  Mother has a  history of MI, age unknown.  He has a brother with hypertension and  diabetes.   PAST MEDICAL HISTORY:  1.  Chest pain as described with recent percutaneous coronary intervention      and Cypher stent implantation to the RCA.  2.  Diabetes mellitus on metformin.  3.  Dyslipidemia with a recent addition of Zetia to Zocor with most recent      HDL cholesterol 43 and LDL 110.  4.  Posttraumatic stress disorder followed by the [facility].   ALLERGIES:  ERYTHROMYCIN.   CURRENT MEDICATIONS:  1.  Enteric-coated aspirin 325 mg daily.  The patient inadvertently has been      taking 81 mg daily since discharge.  2.  loratadine mg at bedtime p.r.n.  3.  Metformin 1000 mg twice daily.  The patient was instructed not to take      until July 18, 2005, post catheterization.  4.  Mirtazapine 15 mg  at hour of sleep.  5.  Vitorin 10/80.  6.  Plavix 75 mg daily.  7.  Lisinopril 5 mg daily which is a new medication as of last admission.   PHYSICAL EXAMINATION:  GENERAL:  Pleasant, well-developed male, currently  complaining of atypical left anterior chest wall pain.  The patient is  concerned this may be coronary etiology.  VITAL SIGNS:  Temperature 98, blood pressure 121/79, pulse bradycardic at  53, respirations 20.  HEENT:  Head is normocephalic.  Sclerae are not injected.  NECK:  Supple without adenopathy.  NEUROLOGIC:  Alert and oriented x3,  moving all extremities x4.  No focal  neurological deficits.  CHEST: Bilateral lung sounds clear to auscultation.  Effort is not labored.  He is currently on 2 liters nasal cannula, saturating 98%.  Left anterior  chest wall is slightly tender to palpation.  CARDIAC:  Heart sounds S1 and S2.  No rubs, murmurs, or gallops. No JVD,  carotids 2+ bilaterally without bruits.  Maintaining sinus rhythm on the  bedside telemetry.  ABDOMEN:  Soft,  nontender, nondistended without hepatosplenomegaly, masses,  or bruits.  EXTREMITIES: Symmetrical.  No clubbing, cyanosis, or edema.   LABORATORY DATA:  Pending at this time.  Point-of-care markers I-STAT with  creatinine, PT, PTT, D-dimer, diagnostics.   Chest x-ray is pending.   EKG has been done and shows sinus rhythm with nonspecific ST changes.  No  acute ischemic changes.   IMPRESSION:  1.  Chest pain in patient with recent percutaneous coronary intervention and      stent to the right coronary artery.  Rule out stent occlusion.  This is      atypical type pain at the present time.  2.  Adult-onset diabetes mellitus.  3.  Dyslipidemia.  4.  Posttraumatic stress disorder.  5.  Sinus bradycardia.   PLAN:  1.  The patient will be admitted to the telemetry unit.  2.  We will follow his point-of-care enzymes as well as monitor serial      cardiac panel.  3.  If enzymes are positive, the patient will require cardiac      catheterization to clarify if there is stent occlusion.  If enzymes      become positive, I have discussed with Dr. Eldridge Dace, and we will start      Lovenox subcutaneously q.12 h. as well as IV nitroglycerin.  Otherwise,      will continue Plavix, aspirin, and ACE inhibitor as previously and      use p.r.n. sublingual nitroglycerin since her index shows suspicion of      this chest pain as cardiac etiology is low.  4.  Continue home medications otherwise including metformin.  Check      Glucometer before meals or food and hour of sleep.      Allison L. Rennis Harding, N.P.    ______________________________  Corky Crafts, MD    ALE/MEDQ  D:  07/19/2005  T:  07/19/2005  Job:  [id number]

## 2011-01-08 NOTE — Discharge Summary (Signed)
[name], [name]               ACCOUNT NO.:  [id number]   MEDICAL RECORD NO.:  [id number]          PATIENT TYPE:  INP   LOCATION:  3704                         FACILITY:  [facility]   PHYSICIAN:  Theone Stanley, MD   DATE OF BIRTH:  July 18, 1946   DATE OF ADMISSION:  07/14/2005  DATE OF DISCHARGE:  07/17/2005                                 DISCHARGE SUMMARY   ADMISSION DIAGNOSES:  1.  Atypical chest pain.  2.  Diabetes.  3.  Hyperlipidemia.  4.  Question post traumatic stress disorder.   DISCHARGE DIAGNOSES:  1.  Atypical chest pain.  2.  Coronary disease on catheterization, status post PTCA.  3.  Diabetes.  4.  Hyperlipidemia.  5.  Question post traumatic stress disorder.   CONSULTATIONS:  1.  Dr. Reyes Ivan for [facility].  2.  Dr. Eldridge Dace performed a cardiac catheterization.   PROCEDURES/DIAGNOSTIC TESTS:  1.  The patient had a cardiac catheterization performed on July 16, 2005.  Findings:  Left main coronary was slightly patent.  Circumflex      large with some luminal irregularities.  Left anterior descending artery      with large vessel showed minor luminal irregularities.  RCA large      codominant vessel.  Long area of atherosclerosis with an area of 95%.  2.  The patient had a stent placed in the right coronary artery.   LABORATORY DATA:  White count of 5.2, hemoglobin 13.8, hematocrit at 40,  platelets at 142.  Sodium 139, potassium 3.9, chloride 110, CO2 25, glucose  at 96, BUN at 8, creatinine 1.1.  LDL 110.   HOSPITAL COURSE:  [name] is a very pleasant 65 year old gentleman who  presented to the hospital with intermittent mild to moderate left  parasternal chest pain.  It did not appear related to activity; however,  because he had persistent pain for the last three days, he felt it was  necessary to come to the hospital.  On arrival to the hospital his cardiac  enzymes were negative and his EKG did not show any acute changes; however,  because of his multiple risk factors, it was felt that it would be best to  admit the patient to rule out MI.  Again because of his multiple risk  factors, it was felt that cardiology should be consulted to determine  whether a stress test should be done versus cardiac catheterization.  After  consultation cardiology took the patient to the cath lab on November 24.  Please see results above.  Post cath the patient did quite well.  He was  eating.  He was mobile and it was felt he could be discharged the next day.   DISCHARGE MEDICATIONS:  1.  Enteric-coated aspirin 325 one p.o. daily.  2.  Metformin 1000 mg twice a day.  He is instructed not to take it until      November 26.  3.  __________ 50 mg q.h.s.  4.  Simvastatin 20 mg daily.  5.  Zetia 10 mg daily.  6.  Plavix 75  mg daily.  7.  Amitriptyline 25 mg q.h.s.  8.  Lisinopril 5 mg daily.  9.  Loratadine 10 mg daily.   FOLLOW UP:  The patient was to follow up with Dr. Eldridge Dace on December 8 at  10 a.m., phone [phone].  The patient is to follow up with his primary care  physician in 2-3 weeks.      Theone Stanley, MD  Electronically Signed     AEJ/MEDQ  D:  07/17/2005  T:  07/17/2005  Job:  [id number]   cc:   Armanda Magic, M.D.  Fax: [phone]   Garen Grams, Dr.  [facility]
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
## 2011-01-08 NOTE — H&P (Signed)
[name], [name]               ACCOUNT NO.:  [id number]   MEDICAL RECORD NO.:  [id number]          PATIENT TYPE:  EMS   LOCATION:  MAJO                         FACILITY:  [facility]   PHYSICIAN:  Cassell Clement, M.D. DATE OF BIRTH:  1946-03-22   DATE OF ADMISSION:  04/06/2006  DATE OF DISCHARGE:                                HISTORY & PHYSICAL   CHIEF COMPLAINT:  Chest pain.   HISTORY:  This is a 65 year old African American male admitted with left  chest pain.  The patient does have known coronary artery disease.  In  November 2006, he presented with chest pain and had two stents placed in his  right coronary artery. He was seen in December 2006 in the emergency room  with recurrent pain and was hospitalized overnight for observation and ruled  out and declined cardiac catheterization at that time.  He has not having  post stent catheterization.  Recently, he has had chest pain off and on for  the past 4-5 days, left lateral pain, it is nonexertional, it does radiate  to the left arm.  There has been no relief with sublingual nitroglycerin in  the emergency room.  There has been no nausea or vomiting, no diaphoresis or  dyspnea.   FAMILY HISTORY:  Reveals his parents both died of heart problems.   SOCIAL HISTORY:  Reveals that he lives with his wife.  He does not use  alcohol or illegal drugs.  He is retired from [employer].  He does have a  history of post traumatic stress syndrome.   PAST MEDICAL HISTORY:  Reveals he does have diabetes and is on metformin.  This was prescribed to be taken two a day but he only takes one a day, 500  mg.  He does have a history of asthma and is on a Ventolin inhaler.   REVIEW OF SYSTEMS:  Reveals that he has not been having any abdominal pain,  change in bowel habits, hematochezia or melena.  He has had no dysuria.  The  remainder of his review of systems is negative except as noted in the  present illness.   PHYSICAL EXAMINATION:  VITAL  SIGNS:  Blood pressure is 110/80, pulse 47 regular, respirations are  normal.  SKIN:  Warm and dry.  NECK:  Jugular venous pressure normal.  Carotids normal.  CHEST: Clear.  HEART:  Reveals no murmur, gallop, rub, or click.  ABDOMEN:  Soft and nontender.  EXTREMITIES:  Show no phlebitis or edema.   PRESENT MEDICATIONS:  Include generic Remeron 15 mg h.s., Vytorin 10/80 one  at bedtime, nitroglycerin p.r.n., metformin 500 mg daily, Ventolin inhaler  p.r.n., Plavix 75 mg daily, Ecotrin 325 mg daily, and Advair Discus p.r.n.   His electrocardiogram in the emergency room shows sinus bradycardia, slight  ST elevation in leads 1, 2, AVF, V2 through V6 suggestive of benign early  repolarization.  There is no significant change since the previous EKG of  December 2006. His chest x-ray was normal.  His cardiac enzymes are pending.  CMET is normal. CBC normal.   IMPRESSION:  1. Chest pain, rule out MI.  2. Past history of coronary artery disease with a history of having had      two stents to his right coronary artery in November 2006.  3. History of post-traumatic stress syndrome.  4. Diabetes.  5. Hypercholesterolemia.  6. Asthma.   DISPOSITION:  We are admitting to Dr. Eldridge Dace, telemetry, he will get  serial enzymes.  We will place him on IV nitroglycerin and IV heparin.  We  will hold his Glucophage.  Will hold his breakfast Thursday until seen by  Dr. Eldridge Dace who may want to consider stress Cardiolite versus cath versus  outpatient observation.           ______________________________  Cassell Clement, M.D.     TB/MEDQ  D:  04/06/2006  T:  04/06/2006  Job:  [id number]   cc:   Corky Crafts, MD

## 2011-01-08 NOTE — Cardiovascular Report (Signed)
NAMEJOVONTA, [name]               ACCOUNT NO.:  [id number]   MEDICAL RECORD NO.:  [id number]          PATIENT TYPE:  OIB   LOCATION:  NA                           FACILITY:  [facility]   PHYSICIAN:  Corky Crafts, MDDATE OF BIRTH:  [birth year]   DATE OF PROCEDURE:  07/11/2006  DATE OF DISCHARGE:                            CARDIAC CATHETERIZATION   PROCEDURES PERFORMED:  1. Coronary angiogram.  2. Left ventriculogram.  3. Abdominal aortogram.  4. Left heart catheterization.   OPERATOR:  Corky Crafts, MD   INDICATIONS:  Coronary artery disease and chest pain.   PROCEDURE:  After the risks and benefits of cardiac catheterization were  explained to the patient and informed consent was obtained, the patient  was brought to the catheterization lab.  He was prepped and draped in  the usual sterile fashion.  His right groin was infiltrated with 1%  lidocaine.  A 4-French arterial sheath was placed into the right femoral  artery using the modified Seldinger technique.  Left coronary artery  angiography was performed using a JL-4.0 catheter.  The catheter was  advanced to the vessel ostium under fluoroscopic guidance.  Digital  angiography was performed in multiple projections using hand injection  of contrast.  Right coronary artery angiography was then performed using  a JR-4.0 catheter.  The catheter was advanced to the vessel ostium under  fluoroscopic guidance.  Digital angiography was performed in multiple  projections using hand injection of contrast.  Left ventriculogram was  then performed; a pigtail catheter was advanced to the ascending aorta  and across the aortic valve under fluoroscopic guidance.  The third  degree RAO projection was used with a power injection of contrast in the  left ventricle.  Under continuous hemodynamic pressure monitoring, a  pullback was performed across the aortic valve.  Hemodynamic pressures  were recorded.  Catheter was then withdrawn to  the level of the  abdominal aorta.  An AP power injection of contrast was performed to  evaluate the renal arteries and infrarenal aorta.  The sheath was  removed using manual compression.   FINDINGS:  The left main has a bend in the proximal portion.  In some  views there appears to be a mild ostial left main stenosis.  There was  no pressure damping with the catheter.  There was good blow-back of  contrast.  The left main was widely patent and was unchanged from last  year's study.  The left anterior descending was widely patent.  There  were minor luminal irregularities.  The first diagonal was a medium-  sized vessel with minor irregularities.  The second diagonal was a small  vessel.  The circumflex was codominant with minor irregularities.  There  was a large branching OM-1 vessel.  The right coronary artery was a  medium sized, codominant vessel.  The stents  in the mid RCA are widely  patent.  The left ventricular function appears normal.  The ejection  fraction was  55%.  The abdominal aorta shows no renal artery stenosis.  There was a small infrarenal abdominal aortic aneurysm measuring  3.8 cm.  There are mild irregularities in the common iliac artery with mild  ectasia.   HEMODYNAMIC RESULTS:  Aortic pressure of 130/79, with a mean aortic  pressure of 101 mmHg.  The left ventricular pressure 120/5, with an  LVEDP of 13 mmHg.   IMPRESSIONS:  1. No hemodynamically significant coronary artery disease; widely      patent right coronary artery stent.  2. Normal left ventricular function, with an estimated ejection      fraction of 55%.  3. Small abdominal aortic aneurysm measuring 3.8 cm.   RECOMMENDATIONS:  Continue aspirin and Plavix, along with other  aggressive medical therapy.  The patient should continue on lipid  lowering therapy and continue to abstain from smoking.      Corky Crafts, MD  Electronically Signed     JSV/MEDQ  D:  07/11/2006  T:   07/11/2006  Job:  [id number]

## 2011-05-19 LAB — POCT CARDIAC MARKERS
CKMB, poc: <1 — ABNORMAL LOW
Myoglobin, poc: 36.9
Operator id: 277751
Troponin i, poc: <0.05

## 2011-05-19 LAB — POCT I-STAT, CHEM 8
BUN: 14
Calcium, Ion: 1.11 — ABNORMAL LOW
Chloride: 106
Creatinine, Ser: 1.1
Glucose, Bld: 155 — ABNORMAL HIGH
HCT: 45
Hemoglobin: 15.3
Potassium: 3.8
Sodium: 140
TCO2: 23

## 2011-05-19 LAB — DIFFERENTIAL
Eosinophils Absolute: 0.1
Eosinophils Relative: 2
Lymphocytes Relative: 45
Lymphs Abs: 2.8
Monocytes Relative: 8

## 2011-05-19 LAB — PROTIME-INR
INR: 0.9
Prothrombin Time: 12.6

## 2011-05-19 LAB — CBC
HCT: 42.7
MCV: 89.1
Platelets: 161
RBC: 4.8
WBC: 6.2

## 2011-05-20 LAB — CBC
HCT: 41.5
Hemoglobin: 14.1
MCHC: 34
MCV: 89
Platelets: 150
RDW: 14.1

## 2011-05-20 LAB — LIPID PANEL
Cholesterol: 102
HDL: 32 — ABNORMAL LOW
LDL Cholesterol: 60
Total CHOL/HDL Ratio: 3.2

## 2011-05-20 LAB — CARDIAC PANEL(CRET KIN+CKTOT+MB+TROPI)
CK, MB: 1.3
Total CK: 112
Troponin I: <0.01

## 2011-05-20 LAB — BASIC METABOLIC PANEL
BUN: 12
Calcium: 8.6
Creatinine, Ser: 1.05
GFR calc non Af Amer: >60
Glucose, Bld: 89

## 2011-05-20 LAB — APTT: aPTT: 26

## 2011-06-01 LAB — BASIC METABOLIC PANEL
BUN: 11
BUN: 11
CO2: 28
CO2: 28
CO2: 30
Calcium: 9.1
Chloride: 104
Chloride: 105
Creatinine, Ser: 0.95
Creatinine, Ser: 1.06
GFR calc Af Amer: >60
Glucose, Bld: 111 — ABNORMAL HIGH
Potassium: 3.5

## 2011-06-01 LAB — I-STAT 8, (EC8 V) (CONVERTED LAB)
Acid-base deficit: 1
Bicarbonate: 23
Chloride: 107
HCT: 52
Operator id: 272551
TCO2: 24
pCO2, Ven: 35 — ABNORMAL LOW
pH, Ven: 7.426 — ABNORMAL HIGH

## 2011-06-01 LAB — DIFFERENTIAL
Basophils Absolute: 0
Basophils Absolute: 0
Basophils Absolute: 0
Basophils Relative: 0
Basophils Relative: 0
Basophils Relative: 0
Basophils Relative: 0
Eosinophils Absolute: 0 — ABNORMAL LOW
Eosinophils Absolute: 0 — ABNORMAL LOW
Eosinophils Absolute: 0 — ABNORMAL LOW
Eosinophils Relative: 1
Eosinophils Relative: 1
Lymphs Abs: 1.2
Lymphs Abs: 2.7
Monocytes Relative: 11
Monocytes Relative: 8
Monocytes Relative: 8
Neutro Abs: 3.4
Neutrophils Relative %: 43
Neutrophils Relative %: 52
Neutrophils Relative %: 72

## 2011-06-01 LAB — POCT I-STAT CREATININE
Creatinine, Ser: 1
Operator id: 272551

## 2011-06-01 LAB — CBC
HCT: 42.2
HCT: 47.5
MCHC: 32.7
MCHC: 32.8
MCHC: 32.9
MCHC: 33
MCV: 89.2
MCV: 89.5
MCV: 89.9
Platelets: 150
Platelets: 153
Platelets: 169
RBC: 4.7
RBC: 4.86
RDW: 14.5
RDW: 15.1
WBC: 6.2
WBC: 7.2

## 2011-06-01 LAB — CARDIAC PANEL(CRET KIN+CKTOT+MB+TROPI)
Relative Index: 0.9
Relative Index: 1.1
Total CK: 275 — ABNORMAL HIGH
Troponin I: 0.01
Troponin I: 0.01

## 2011-06-01 LAB — POCT CARDIAC MARKERS
Myoglobin, poc: 61.3
Operator id: 272551

## 2011-06-01 LAB — TROPONIN I: Troponin I: 0.03

## 2011-06-01 LAB — B-NATRIURETIC PEPTIDE (CONVERTED LAB): Pro B Natriuretic peptide (BNP): <30

## 2011-06-01 LAB — CK TOTAL AND CKMB (NOT AT ARMC): Total CK: 116

## 2011-06-10 LAB — I-STAT 8, (EC8 V) (CONVERTED LAB)
Acid-base deficit: 1
Chloride: 112
HCT: 49
Potassium: 4.2
pH, Ven: 7.412 — ABNORMAL HIGH

## 2011-06-10 LAB — DIFFERENTIAL
Basophils Absolute: 0
Eosinophils Relative: 2
Lymphocytes Relative: 43
Monocytes Absolute: 0.6

## 2011-06-10 LAB — POCT CARDIAC MARKERS
Myoglobin, poc: 48.6
Troponin i, poc: <0.05

## 2011-06-10 LAB — POCT I-STAT CREATININE: Operator id: 272551

## 2011-06-10 LAB — CBC
MCV: 89.9
RBC: 5.07
WBC: 6.6

## 2014-06-30 ENCOUNTER — Encounter: Payer: Self-pay | Admitting: *Deleted

## 2015-01-22 ENCOUNTER — Encounter: Payer: Self-pay | Admitting: Podiatry

## 2015-01-22 ENCOUNTER — Ambulatory Visit (INDEPENDENT_AMBULATORY_CARE_PROVIDER_SITE_OTHER): Payer: Medicare Other | Admitting: Podiatry

## 2015-01-22 VITALS — BP 152/98 | HR 57 | Temp 97.6°F | Resp 14

## 2015-01-22 DIAGNOSIS — L608 Other nail disorders: Secondary | ICD-10-CM

## 2015-01-22 DIAGNOSIS — E119 Type 2 diabetes mellitus without complications: Secondary | ICD-10-CM | POA: Diagnosis not present

## 2015-01-22 DIAGNOSIS — L609 Nail disorder, unspecified: Secondary | ICD-10-CM | POA: Diagnosis not present

## 2015-01-22 NOTE — Progress Notes (Signed)
   Subjective:    Patient ID: [name], male    DOB: [birth year], 69 y.o.   MRN: [id number]  HPI N-none L- B/L toenails D-3 mos. O-gradual C- none A- putting on socks T-tried to cut but ended up cutting toe , (diabetic now)  Patient presents today for a B/L toenail trim toe.    Review of Systems  HENT:       Ringing in ears   Patient denies any history of skin ulceration or claudication    Objective:   Physical Exam  Orientated 3  Vascular: DP and PT pulses 2/4 bilaterally Capillary reflex immediate bilaterally  Neurological: Ankle reflex equal and reactive bilaterally Vibratory sensation intact bilaterally Sensation to 10 g monofilament wire intact 5/5 bilaterally  Dermatological: Texture and turgor within normal limits bilaterally No open skin lesions bilaterally The toenails are incurvated, elongated and normal trophic Patient noticed mole on lateral right foot increasing in size over time. A well-defined molelike lesion on the lateral right heel noted sharply circumscribed with regular borders. Multiple pigmented lesions right and left feet noted  Musculoskeletal: HAV deformities bilaterally     Assessment & Plan:   Assessment: Satisfactory neurovascular status Diabetic without complications Incurvated toenails 6-10 Pigmented lesion right heel that needs further evaluation by dermatologist  Plan: Reviewed the results of patient's examination today I advised patient to contact dermatologist and recommended [facility] dermatology to evaluate pigmented lesion on right heel Debrided toenails 6-10 without any bleeding  I recommended that patient have nail debridement at pedicurist without soaking in the whirlpool or manipulation the cuticles. Reappoint when necessary or at 1 year intervals

## 2015-01-22 NOTE — Patient Instructions (Addendum)
Have dermatologist evaluate mole on right heel area immediately as he said is increasing in size  Diabetes and Foot Care Diabetes may cause you to have problems because of poor blood supply (circulation) to your feet and legs. This may cause the skin on your feet to become thinner, break easier, and heal more slowly. Your skin may become dry, and the skin may peel and crack. You may also have nerve damage in your legs and feet causing decreased feeling in them. You may not notice minor injuries to your feet that could lead to infections or more serious problems. Taking care of your feet is one of the most important things you can do for yourself.  HOME CARE INSTRUCTIONS  Wear shoes at all times, even in the house. Do not go barefoot. Bare feet are easily injured.  Check your feet daily for blisters, cuts, and redness. If you cannot see the bottom of your feet, use a mirror or ask someone for help.  Wash your feet with warm water (do not use hot water) and mild soap. Then pat your feet and the areas between your toes until they are completely dry. Do not soak your feet as this can dry your skin.  Apply a moisturizing lotion or petroleum jelly (that does not contain alcohol and is unscented) to the skin on your feet and to dry, brittle toenails. Do not apply lotion between your toes.  Trim your toenails straight across. Do not dig under them or around the cuticle. File the edges of your nails with an emery board or nail file.  Do not cut corns or calluses or try to remove them with medicine.  Wear clean socks or stockings every day. Make sure they are not too tight. Do not wear knee-high stockings since they may decrease blood flow to your legs.  Wear shoes that fit properly and have enough cushioning. To break in new shoes, wear them for just a few hours a day. This prevents you from injuring your feet. Always look in your shoes before you put them on to be sure there are no objects inside.  Do  not cross your legs. This may decrease the blood flow to your feet.  If you find a minor scrape, cut, or break in the skin on your feet, keep it and the skin around it clean and dry. These areas may be cleansed with mild soap and water. Do not cleanse the area with peroxide, alcohol, or iodine.  When you remove an adhesive bandage, be sure not to damage the skin around it.  If you have a wound, look at it several times a day to make sure it is healing.  Do not use heating pads or hot water bottles. They may burn your skin. If you have lost feeling in your feet or legs, you may not know it is happening until it is too late.  Make sure your health care provider performs a complete foot exam at least annually or more often if you have foot problems. Report any cuts, sores, or bruises to your health care provider immediately. SEEK MEDICAL CARE IF:   You have an injury that is not healing.  You have cuts or breaks in the skin.  You have an ingrown nail.  You notice redness on your legs or feet.  You feel burning or tingling in your legs or feet.  You have pain or cramps in your legs and feet.  Your legs or feet are numb.  Your feet always feel cold. SEEK IMMEDIATE MEDICAL CARE IF:   There is increasing redness, swelling, or pain in or around a wound.  There is a red line that goes up your leg.  Pus is coming from a wound.  You develop a fever or as directed by your health care provider.  You notice a bad smell coming from an ulcer or wound. Document Released: 08/06/2000 Document Revised: 04/11/2013 Document Reviewed: 01/16/2013 [facility] Patient Information 2015 [place], [place]. This information is not intended to replace advice given to you by your health care provider. Make sure you discuss any questions you have with your health care provider.

## 2016-07-02 ENCOUNTER — Emergency Department (HOSPITAL_COMMUNITY): Payer: Non-veteran care

## 2016-07-02 ENCOUNTER — Encounter (HOSPITAL_COMMUNITY): Payer: Self-pay | Admitting: *Deleted

## 2016-07-02 ENCOUNTER — Inpatient Hospital Stay (HOSPITAL_COMMUNITY)
Admission: EM | Admit: 2016-07-02 | Discharge: 2016-07-06 | DRG: 287 | Disposition: A | Discharge status: Home / Self Care | Payer: Non-veteran care | Attending: Internal Medicine | Admitting: Internal Medicine

## 2016-07-02 DIAGNOSIS — Z888 Allergy status to other drugs, medicaments and biological substances status: Secondary | ICD-10-CM

## 2016-07-02 DIAGNOSIS — R079 Chest pain, unspecified: Secondary | ICD-10-CM | POA: Diagnosis present

## 2016-07-02 DIAGNOSIS — R001 Bradycardia, unspecified: Secondary | ICD-10-CM | POA: Diagnosis present

## 2016-07-02 DIAGNOSIS — J45909 Unspecified asthma, uncomplicated: Secondary | ICD-10-CM | POA: Diagnosis present

## 2016-07-02 DIAGNOSIS — R310 Gross hematuria: Secondary | ICD-10-CM | POA: Diagnosis not present

## 2016-07-02 DIAGNOSIS — E119 Type 2 diabetes mellitus without complications: Secondary | ICD-10-CM | POA: Diagnosis present

## 2016-07-02 DIAGNOSIS — Z87442 Personal history of urinary calculi: Secondary | ICD-10-CM

## 2016-07-02 DIAGNOSIS — I10 Essential (primary) hypertension: Secondary | ICD-10-CM | POA: Diagnosis present

## 2016-07-02 DIAGNOSIS — Z955 Presence of coronary angioplasty implant and graft: Secondary | ICD-10-CM

## 2016-07-02 DIAGNOSIS — IMO0002 Reserved for concepts with insufficient information to code with codable children: Secondary | ICD-10-CM | POA: Diagnosis present

## 2016-07-02 DIAGNOSIS — E876 Hypokalemia: Secondary | ICD-10-CM | POA: Diagnosis present

## 2016-07-02 DIAGNOSIS — E1122 Type 2 diabetes mellitus with diabetic chronic kidney disease: Secondary | ICD-10-CM | POA: Diagnosis present

## 2016-07-02 DIAGNOSIS — Z79899 Other long term (current) drug therapy: Secondary | ICD-10-CM

## 2016-07-02 DIAGNOSIS — Z7982 Long term (current) use of aspirin: Secondary | ICD-10-CM

## 2016-07-02 DIAGNOSIS — I25118 Atherosclerotic heart disease of native coronary artery with other forms of angina pectoris: Secondary | ICD-10-CM | POA: Diagnosis not present

## 2016-07-02 DIAGNOSIS — I129 Hypertensive chronic kidney disease with stage 1 through stage 4 chronic kidney disease, or unspecified chronic kidney disease: Secondary | ICD-10-CM | POA: Diagnosis present

## 2016-07-02 DIAGNOSIS — I200 Unstable angina: Secondary | ICD-10-CM | POA: Diagnosis present

## 2016-07-02 DIAGNOSIS — E1151 Type 2 diabetes mellitus with diabetic peripheral angiopathy without gangrene: Secondary | ICD-10-CM | POA: Diagnosis present

## 2016-07-02 DIAGNOSIS — Z7984 Long term (current) use of oral hypoglycemic drugs: Secondary | ICD-10-CM

## 2016-07-02 DIAGNOSIS — Z7902 Long term (current) use of antithrombotics/antiplatelets: Secondary | ICD-10-CM

## 2016-07-02 DIAGNOSIS — F17200 Nicotine dependence, unspecified, uncomplicated: Secondary | ICD-10-CM | POA: Diagnosis present

## 2016-07-02 DIAGNOSIS — S301XXA Contusion of abdominal wall, initial encounter: Secondary | ICD-10-CM

## 2016-07-02 HISTORY — DX: Personal history of urinary calculi: Z87.442

## 2016-07-02 HISTORY — DX: Unspecified asthma, uncomplicated: J45.909

## 2016-07-02 HISTORY — DX: Atherosclerotic heart disease of native coronary artery without angina pectoris: I25.10

## 2016-07-02 HISTORY — DX: Peripheral vascular disease, unspecified: I73.9

## 2016-07-02 HISTORY — DX: Essential (primary) hypertension: I10

## 2016-07-02 LAB — URINALYSIS, ROUTINE W REFLEX MICROSCOPIC
BILIRUBIN URINE: NEGATIVE
Glucose, UA: NEGATIVE mg/dL
Ketones, ur: NEGATIVE mg/dL
Nitrite: NEGATIVE
Protein, ur: NEGATIVE mg/dL
Specific Gravity, Urine: 1.006 (ref 1.005–1.030)
pH: 5.5 (ref 5.0–8.0)

## 2016-07-02 LAB — BASIC METABOLIC PANEL
ANION GAP: 10 (ref 5–15)
BUN: 9 mg/dL (ref 6–20)
CALCIUM: 9.2 mg/dL (ref 8.9–10.3)
CO2: 22 mmol/L (ref 22–32)
CREATININE: 1.11 mg/dL (ref 0.61–1.24)
Chloride: 108 mmol/L (ref 101–111)
Glucose, Bld: 157 mg/dL — ABNORMAL HIGH (ref 65–99)
Potassium: 3.4 mmol/L — ABNORMAL LOW (ref 3.5–5.1)
Sodium: 140 mmol/L (ref 135–145)

## 2016-07-02 LAB — CBC
HCT: 44.8 % (ref 39.0–52.0)
HEMOGLOBIN: 15 g/dL (ref 13.0–17.0)
MCH: 30.2 pg (ref 26.0–34.0)
MCHC: 33.5 g/dL (ref 30.0–36.0)
MCV: 90.3 fL (ref 78.0–100.0)
PLATELETS: 167 10*3/uL (ref 150–400)
RBC: 4.96 MIL/uL (ref 4.22–5.81)
RDW: 14.1 % (ref 11.5–15.5)
WBC: 7.1 10*3/uL (ref 4.0–10.5)

## 2016-07-02 LAB — URINE MICROSCOPIC-ADD ON

## 2016-07-02 LAB — TROPONIN I

## 2016-07-02 MED ORDER — NITROGLYCERIN 0.4 MG SL SUBL
0.4000 mg | SUBLINGUAL_TABLET | SUBLINGUAL | Status: DC | PRN
  Administered 2016-07-03 (×2): 0.4 mg via SUBLINGUAL
  Filled 2016-07-02: qty 1

## 2016-07-02 NOTE — ED Triage Notes (Signed)
The pt is c/o lt chest pain with lt shoulder and arm pain  He is also c/o blood in his urine  He is scheduled for a c-t tomorrow at the [facility] hospital

## 2016-07-02 NOTE — ED Provider Notes (Signed)
[facility] DEPT Provider Note   CSN: [id number] Arrival date & time: 07/02/16  2055  By signing my name below, I, Irene Pap, attest that this documentation has been prepared under the direction and in the presence of Leo Grosser, MD. Electronically Signed: Irene Pap, ED Scribe. 07/02/16. 11:22 PM.  History   Chief Complaint Chief Complaint  Patient presents with  . Chest Pain  . Hematuria   The history is provided by the patient. No language interpreter was used.   HPI Comments: Beni Turrell is a 70 y.o. male with a hx of DM and heart stent placement who presents to the Emergency Department complaining of gradually worsening, intermittent, sharp, central chest pain that radiates to the left shoulder and left arm onset 4 days ago. He notes that his chest pain "feels like I have to burp." Pt says that his symptoms first began with the shoulder pain. He notes associated nausea, SOB, lightheadedness, and fatigue. Pt says that he has been having the chest pain symptoms intermittently throughout the years since his stent placement in 2006, but the arm pain is new to this week. Pt reports that he is also having hematuria. He had 2-3 episodes of bilateral flank pain in the past 3 days that has since resolved. He is scheduled to have a CT scan performed tomorrow at the [facility] for an abdominal aortic aneurysm. Pt says that he does not know the exact size of the aneurysm, but says that it has not changed much in the past 3-4 years. He also notes that there is a clot present. Pt is on Plavix. He denies leg swelling or abdominal pain. Cardiologist: Dr. Lilia Argue  Past Medical History:  Diagnosis Date  . Diabetes mellitus without complication ([place])     There are no active problems to display for this patient.  Past Surgical History:  Procedure Laterality Date  . heart stents  2006    Home Medications    Prior to Admission medications   Medication Sig Start Date End Date  Taking? Authorizing Provider  albuterol (PROVENTIL HFA;VENTOLIN HFA) 108 (90 BASE) MCG/ACT inhaler Inhale into the lungs every 6 (six) hours as needed for wheezing or shortness of breath. Inhale 2 puffs every 6 hours as needed for breathing    Historical Provider, MD  aspirin 81 MG chewable tablet Chew by mouth daily.    Historical Provider, MD  buPROPion (ZYBAN) 150 MG 12 hr tablet Take 150 mg by mouth daily. 12hr    Historical Provider, MD  clopidogrel (PLAVIX) 75 MG tablet Take 75 mg by mouth daily.    Historical Provider, MD  doxazosin (CARDURA) 1 MG tablet Take 1 mg by mouth daily.    Historical Provider, MD  gabapentin (NEURONTIN) 300 MG capsule Take 300 mg by mouth 2 (two) times daily.    Historical Provider, MD  isosorbide mononitrate (IMDUR) 30 MG 24 hr tablet Take 30 mg by mouth daily.    Historical Provider, MD  LANCETS ULTRA FINE MISC by Does not apply route. Use one lancet ,monolet on the skin as directed    Historical Provider, MD  meclizine (ANTIVERT) 25 MG tablet Take 25 mg by mouth daily. One tablet by mouth every 8 hrs. As needed    Historical Provider, MD  metFORMIN (GLUCOPHAGE) 850 MG tablet Take 850 mg by mouth daily with breakfast.    Historical Provider, MD  nitroGLYCERIN (NITROSTAT) 0.4 MG SL tablet Place 0.4 mg under the tongue every 5 (five) minutes as needed  for chest pain.    Historical Provider, MD  pravastatin (PRAVACHOL) 80 MG tablet Take 80 mg by mouth daily. Take one half tablet every day at bedtime    Historical Provider, MD  rOPINIRole (REQUIP) 1 MG tablet Take 1 mg by mouth daily.    Historical Provider, MD  sildenafil (VIAGRA) 100 MG tablet Take 100 mg by mouth daily as needed for erectile dysfunction.    Historical Provider, MD   Family History No family history on file.  Social History Social History  Substance Use Topics  . Smoking status: Former Research scientist (life sciences)  . Smokeless tobacco: Never Used  . Alcohol use No   Allergies   Erythromycin and  Simvastatin  Review of Systems Review of Systems  Constitutional: Positive for fatigue.  Respiratory: Positive for shortness of breath.   Cardiovascular: Positive for chest pain. Negative for leg swelling.  Gastrointestinal: Positive for nausea. Negative for abdominal pain.  Genitourinary: Positive for hematuria.  Neurological: Positive for light-headedness.  All other systems reviewed and are negative.  Physical Exam Updated Vital Signs BP 105/75   Pulse (!) 58   Temp 97.7 F (36.5 C)   Resp 18   Ht '6\' 2"'$  (1.88 m)   Wt 229 lb 4 oz (104 kg)   SpO2 98%   BMI 29.43 kg/m   Physical Exam  Constitutional: He is oriented to person, place, and time. He appears well-developed and well-nourished. No distress.  HENT:  Head: Normocephalic and atraumatic.  Nose: Nose normal.  Eyes: Conjunctivae are normal.  Neck: Neck supple. No tracheal deviation present.  Cardiovascular: Normal rate, regular rhythm, S1 normal, S2 normal and normal heart sounds.  Exam reveals no gallop and no friction rub.   No murmur heard. Pulmonary/Chest: Effort normal and breath sounds normal. No respiratory distress.  Abdominal: Soft. He exhibits no distension and no mass. There is no tenderness.  Neurological: He is alert and oriented to person, place, and time.  Skin: Skin is warm and dry.  Psychiatric: He has a normal mood and affect.  Vitals reviewed.  ED Treatments / Results  DIAGNOSTIC STUDIES: Oxygen Saturation is 98% on RA, normal by my interpretation.    COORDINATION OF CARE: 11:45 PM-Discussed treatment plan which includes labs, EKG, and x-ray with pt at bedside and pt agreed to plan.   Labs (all labs ordered are listed, but only abnormal results are displayed) Labs Reviewed  BASIC METABOLIC PANEL - Abnormal; Notable for the following:       Result Value   Potassium 3.4 (*)    Glucose, Bld 157 (*)    All other components within normal limits  URINALYSIS, ROUTINE W REFLEX MICROSCOPIC (NOT AT  [facility]) - Abnormal; Notable for the following:    Hgb urine dipstick LARGE (*)    Leukocytes, UA TRACE (*)    All other components within normal limits  URINE MICROSCOPIC-ADD ON - Abnormal; Notable for the following:    Squamous Epithelial / LPF 0-5 (*)    Bacteria, UA RARE (*)    All other components within normal limits  URINE CULTURE  CBC  TROPONIN I    EKG  EKG Interpretation  Date/Time:  Friday July 02 2016 21:06:33 EST Ventricular Rate:  59 PR Interval:  148 QRS Duration: 94 QT Interval:  406 QTC Calculation: 401 R Axis:   71 Text Interpretation:  Sinus bradycardia Nonspecific T wave abnormality Abnormal ECG No significant change since last tracing from 03/11/2009 Confirmed by Maleki Hippe MD, Jaryn Rosko [phone]) on  07/02/2016 11:12:41 PM       Radiology Dg Chest 2 View  Result Date: 07/02/2016 CLINICAL DATA:  Left-sided chest pain, dyspnea and cough EXAM: CHEST  2 VIEW COMPARISON:  Studies dating back through 11/04/2007 FINDINGS: Lungs are slightly hyperinflated without pneumonic consolidation. Heart is top-normal in size. The thoracic aorta is not aneurysmal. Pulmonary vasculature is normal. There is no effusion. Tiny 3 mm density overlying the right anterior second rib is unchanged relative to 2009. Findings are consistent with a benign finding. IMPRESSION: Mild pulmonary hyperinflation without pneumonic consolidation, pneumothorax or CHF. Electronically Signed   By: Ashley Royalty M.D.   On: 07/02/2016 21:42   Ct Angio Abd/pel W And/or Wo Contrast  Result Date: 07/03/2016 CLINICAL DATA:  Patient with known aortic aneurysm complaining of mid abdominal pain radiating to both flanks and upper chest. Diabetic. EXAM: CTA ABDOMEN AND PELVIS WITHOUT AND WITH CONTRAST TECHNIQUE: Multidetector CT imaging of the abdomen and pelvis was performed using the standard protocol during bolus administration of intravenous contrast. Multiplanar reconstructed images and MIPs were obtained and reviewed  to evaluate the vascular anatomy. CONTRAST:  100 cc of Isovue 370 IV COMPARISON:  MRI of the lumbar spine from 04/27/2001 report which indicated an abdominal aortic aneurysm at L3 and L4 approximately 2.8 cm transverse. FINDINGS: VASCULAR Aorta: There is an infrarenal fusiform 4.3 cm abdominal aortic aneurysm starting approximately 4.5 cm from the right renal artery. There is soft intraluminal plaque along the posterior right lateral wall of the aneurysm narrowing the lumen up to 60%. The aneurysm terminates at the aortic bifurcation. There is no evidence of dissection or rupture. Celiac: Patent without evidence of aneurysm, dissection, vasculitis or significant stenosis. There is some minimal irregular soft and calcific plaque near its origin. SMA: Patent without evidence of aneurysm, dissection, vasculitis or significant stenosis. Minimal atherosclerosis near its origin without significant stenosis. Renals: Both renal arteries are patent without evidence of aneurysm, dissection, vasculitis, fibromuscular dysplasia or significant stenosis. IMA: Patent without evidence of aneurysm, dissection, vasculitis or significant stenosis. Inflow: Patent without evidence of aneurysm, dissection, vasculitis or significant stenosis. Proximal Outflow: The left external iliac artery demonstrates focal soft tissue plaque, series 5 image 126 narrowing the lumen by 70-80%. There is atherosclerosis along both common iliac arteries and bifurcations with ectasia of the left common iliac artery up to 1.8 cm. Both common femoral arteries appear patent. Veins: No obvious venous abnormality within the limitations of this arterial phase study. Review of the MIP images confirms the above findings. NON-VASCULAR Lower chest: Cardiac chambers are normal in size. Small hiatal hernia is seen. Hepatobiliary: No space-occupying mass of the liver. No biliary dilatation. Normal appearing gallbladder. Pancreas: Normal Spleen: Normal Adrenals/Urinary  Tract: Normal Stomach/Bowel: Scattered colonic diverticulosis. No bowel obstruction or acute diverticulitis. Normal appendix. Lymphatic: No lymphadenopathy. Reproductive: Top-normal size prostate without focal mass. Other: No ascites.  No abdominal wall hernia. Musculoskeletal: No acute osseous appearing abnormalities. IMPRESSION: VASCULAR Fusiform 4.3 cm in caliber infrarenal abdominal aortic aneurysm with plaque noted along the posterior and right lateral wall narrowing the lumen by approximately 50%. No evidence of rupture or dissection. 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 NON-VASCULAR Nonacute. Electronically Signed   By: Ashley Royalty M.D.   On: 07/03/2016 01:49    Procedures Procedures (including critical care time)  Medications Ordered in ED Medications - No data to display   Initial Impression / Assessment and Plan / ED Course  I have reviewed the triage vital signs and the nursing notes.  Pertinent labs & imaging results that were available during my care of the patient were reviewed by me and considered in my medical decision making (see chart for details).  Clinical Course     71 y.o. male presents with Multiple complaints. He has had ongoing intermittent chest pain for the last few weeks but in the last 2 days he has noticed a left shoulder pain that goes into his neck and down his arm. He has waxing and waning substernal chest pressure at rest. He has a history of cardiac stenting and multiple risk factors for ACS. Unable to rule out from the emergency department so will recommend observation stay despite unchanged EKG and negative troponin from here.  Patient has secondary complaint of hematuria that started today that is accompanied by bilateral low back pain. He has a known history of abdominal aortic aneurysm prompting emergent evaluation for rupture with CT angiography. CT returns with right posterior wall plaque and no  evidence of rupture according to radiology. I consult to Dr. Donzetta Matters of vascular surgery to review the CT given the patient's new onset symptoms today. The patient will likely not require any emergent intervention currently.  Hospitalist was consulted for admission for chest pain rule out and will see the patient in the emergency department.   Final Clinical Impressions(s) / ED Diagnoses   Final diagnoses:  Chest pain with high risk for cardiac etiology  Gross hematuria   I personally performed the services described in this documentation, which was scribed in my presence. The recorded information has been reviewed and is accurate.   New Prescriptions New Prescriptions   No medications on file     Leo Grosser, MD 07/03/16 [phone]

## 2016-07-03 ENCOUNTER — Inpatient Hospital Stay (HOSPITAL_COMMUNITY): Payer: Non-veteran care

## 2016-07-03 ENCOUNTER — Emergency Department (HOSPITAL_COMMUNITY): Payer: Non-veteran care

## 2016-07-03 ENCOUNTER — Encounter (HOSPITAL_COMMUNITY): Payer: Self-pay | Admitting: Family Medicine

## 2016-07-03 DIAGNOSIS — I10 Essential (primary) hypertension: Secondary | ICD-10-CM | POA: Diagnosis not present

## 2016-07-03 DIAGNOSIS — R310 Gross hematuria: Secondary | ICD-10-CM | POA: Diagnosis present

## 2016-07-03 DIAGNOSIS — Z888 Allergy status to other drugs, medicaments and biological substances status: Secondary | ICD-10-CM | POA: Diagnosis not present

## 2016-07-03 DIAGNOSIS — IMO0002 Reserved for concepts with insufficient information to code with codable children: Secondary | ICD-10-CM | POA: Diagnosis present

## 2016-07-03 DIAGNOSIS — Z7984 Long term (current) use of oral hypoglycemic drugs: Secondary | ICD-10-CM | POA: Diagnosis not present

## 2016-07-03 DIAGNOSIS — F17200 Nicotine dependence, unspecified, uncomplicated: Secondary | ICD-10-CM | POA: Diagnosis present

## 2016-07-03 DIAGNOSIS — Z955 Presence of coronary angioplasty implant and graft: Secondary | ICD-10-CM | POA: Diagnosis not present

## 2016-07-03 DIAGNOSIS — Z7982 Long term (current) use of aspirin: Secondary | ICD-10-CM | POA: Diagnosis not present

## 2016-07-03 DIAGNOSIS — I129 Hypertensive chronic kidney disease with stage 1 through stage 4 chronic kidney disease, or unspecified chronic kidney disease: Secondary | ICD-10-CM | POA: Diagnosis present

## 2016-07-03 DIAGNOSIS — R001 Bradycardia, unspecified: Secondary | ICD-10-CM | POA: Diagnosis present

## 2016-07-03 DIAGNOSIS — I200 Unstable angina: Secondary | ICD-10-CM | POA: Diagnosis present

## 2016-07-03 DIAGNOSIS — E1159 Type 2 diabetes mellitus with other circulatory complications: Secondary | ICD-10-CM | POA: Diagnosis not present

## 2016-07-03 DIAGNOSIS — I2510 Atherosclerotic heart disease of native coronary artery without angina pectoris: Secondary | ICD-10-CM | POA: Diagnosis not present

## 2016-07-03 DIAGNOSIS — R079 Chest pain, unspecified: Secondary | ICD-10-CM | POA: Diagnosis present

## 2016-07-03 DIAGNOSIS — I208 Other forms of angina pectoris: Secondary | ICD-10-CM

## 2016-07-03 DIAGNOSIS — E1151 Type 2 diabetes mellitus with diabetic peripheral angiopathy without gangrene: Secondary | ICD-10-CM | POA: Diagnosis present

## 2016-07-03 DIAGNOSIS — E876 Hypokalemia: Secondary | ICD-10-CM | POA: Diagnosis present

## 2016-07-03 DIAGNOSIS — Z79899 Other long term (current) drug therapy: Secondary | ICD-10-CM | POA: Diagnosis not present

## 2016-07-03 DIAGNOSIS — Z7902 Long term (current) use of antithrombotics/antiplatelets: Secondary | ICD-10-CM | POA: Diagnosis not present

## 2016-07-03 DIAGNOSIS — J45909 Unspecified asthma, uncomplicated: Secondary | ICD-10-CM | POA: Diagnosis present

## 2016-07-03 DIAGNOSIS — E1122 Type 2 diabetes mellitus with diabetic chronic kidney disease: Secondary | ICD-10-CM | POA: Diagnosis present

## 2016-07-03 DIAGNOSIS — I25118 Atherosclerotic heart disease of native coronary artery with other forms of angina pectoris: Secondary | ICD-10-CM | POA: Diagnosis present

## 2016-07-03 DIAGNOSIS — E119 Type 2 diabetes mellitus without complications: Secondary | ICD-10-CM | POA: Diagnosis present

## 2016-07-03 DIAGNOSIS — Z87442 Personal history of urinary calculi: Secondary | ICD-10-CM | POA: Diagnosis not present

## 2016-07-03 LAB — TROPONIN I: Troponin I: <0.03 ng/mL (ref <0.03)

## 2016-07-03 LAB — MAGNESIUM: Magnesium: 1.9 mg/dL (ref 1.7–2.4)

## 2016-07-03 LAB — MRSA PCR SCREENING: MRSA by PCR: NEGATIVE

## 2016-07-03 LAB — GLUCOSE, CAPILLARY
GLUCOSE-CAPILLARY: 111 mg/dL — AB (ref 65–99)
GLUCOSE-CAPILLARY: 246 mg/dL — AB (ref 65–99)
GLUCOSE-CAPILLARY: 229 mg/dL — AB (ref 65–99)

## 2016-07-03 MED ORDER — ACETAMINOPHEN 325 MG PO TABS: 650.0000 mg | ORAL_TABLET | ORAL | Status: DC | PRN

## 2016-07-03 MED ORDER — CLOPIDOGREL BISULFATE 75 MG PO TABS
75.0000 mg | ORAL_TABLET | Freq: Every day | ORAL | Status: DC
  Administered 2016-07-03 – 2016-07-06 (×4): 75 mg via ORAL
  Filled 2016-07-03 (×4): qty 1

## 2016-07-03 MED ORDER — ASPIRIN EC 325 MG PO TBEC
325.0000 mg | DELAYED_RELEASE_TABLET | Freq: Once | ORAL | Status: AC
  Administered 2016-07-03: 325 mg via ORAL
  Filled 2016-07-03: qty 1

## 2016-07-03 MED ORDER — POTASSIUM CHLORIDE CRYS ER 20 MEQ PO TBCR
40.0000 meq | EXTENDED_RELEASE_TABLET | Freq: Once | ORAL | Status: AC
  Administered 2016-07-03: 40 meq via ORAL
  Filled 2016-07-03: qty 2

## 2016-07-03 MED ORDER — CEPHALEXIN 500 MG PO CAPS
500.0000 mg | ORAL_CAPSULE | Freq: Two times a day (BID) | ORAL | Status: DC
  Administered 2016-07-03 – 2016-07-06 (×7): 500 mg via ORAL
  Filled 2016-07-03 (×7): qty 1

## 2016-07-03 MED ORDER — ONDANSETRON HCL 4 MG/2ML IJ SOLN: 4.0000 mg | Freq: Four times a day (QID) | INTRAMUSCULAR | Status: DC | PRN

## 2016-07-03 MED ORDER — DOXAZOSIN MESYLATE 1 MG PO TABS
1.0000 mg | ORAL_TABLET | Freq: Every day | ORAL | Status: DC
  Administered 2016-07-03 – 2016-07-06 (×4): 1 mg via ORAL
  Filled 2016-07-03 (×5): qty 1

## 2016-07-03 MED ORDER — GABAPENTIN 300 MG PO CAPS
300.0000 mg | ORAL_CAPSULE | Freq: Two times a day (BID) | ORAL | Status: DC
  Administered 2016-07-03 – 2016-07-06 (×7): 300 mg via ORAL
  Filled 2016-07-03 (×7): qty 1

## 2016-07-03 MED ORDER — GI COCKTAIL ~~LOC~~
30.0000 mL | Freq: Four times a day (QID) | ORAL | Status: DC | PRN
  Administered 2016-07-03: 30 mL via ORAL
  Filled 2016-07-03: qty 30

## 2016-07-03 MED ORDER — IOPAMIDOL (ISOVUE-370) INJECTION 76%
INTRAVENOUS | Status: AC
  Administered 2016-07-03: 100 mL
  Filled 2016-07-03: qty 100

## 2016-07-03 MED ORDER — INFLUENZA VAC SPLIT QUAD 0.5 ML IM SUSY
0.5000 mL | PREFILLED_SYRINGE | INTRAMUSCULAR | Status: DC
  Filled 2016-07-03: qty 0.5

## 2016-07-03 MED ORDER — ENOXAPARIN SODIUM 40 MG/0.4ML ~~LOC~~ SOLN
40.0000 mg | SUBCUTANEOUS | Status: DC
  Administered 2016-07-04: 40 mg via SUBCUTANEOUS
  Filled 2016-07-03 (×2): qty 0.4

## 2016-07-03 MED ORDER — ISOSORBIDE MONONITRATE ER 30 MG PO TB24
30.0000 mg | ORAL_TABLET | Freq: Every day | ORAL | Status: DC
  Administered 2016-07-03 – 2016-07-06 (×4): 30 mg via ORAL
  Filled 2016-07-03 (×4): qty 1

## 2016-07-03 MED ORDER — SODIUM CHLORIDE 0.9 % IV SOLN
INTRAVENOUS | Status: DC
  Administered 2016-07-03 – 2016-07-04 (×2): via INTRAVENOUS

## 2016-07-03 MED ORDER — ASPIRIN 81 MG PO CHEW
81.0000 mg | CHEWABLE_TABLET | Freq: Every day | ORAL | Status: DC
  Administered 2016-07-04 – 2016-07-06 (×2): 81 mg via ORAL
  Filled 2016-07-03 (×3): qty 1

## 2016-07-03 MED ORDER — INSULIN ASPART 100 UNIT/ML ~~LOC~~ SOLN
0.0000 [IU] | Freq: Three times a day (TID) | SUBCUTANEOUS | Status: DC
  Administered 2016-07-03: 3 [IU] via SUBCUTANEOUS
  Administered 2016-07-04: 1 [IU] via SUBCUTANEOUS
  Administered 2016-07-04: 2 [IU] via SUBCUTANEOUS
  Administered 2016-07-04 – 2016-07-05 (×2): 3 [IU] via SUBCUTANEOUS

## 2016-07-03 NOTE — H&P (Signed)
History and Physical  Patient Name: [name]     LKG:[id number]    DOB: Feb 06, 1946    DOA: 07/02/2016 [facility] [place]   Patient coming from: Home     Chief Complaint: Chest pain and hematuria  HPI: [name] is a 70 y.o. male with a past medical history significant for CAD s/p PCI years ago, HTN, NIDDM, and smoking who presents with chest pain.  The patient was in his usual state of health until about the last 48 hours when he developed a new left-sided chest discomfort, radiating to the left shoulder neck and arm. This was worsened with exertion, relieved with rest, and associated with nausea.  Today, he had an episode of gross hematuria and so he came to the ER for the comminution of these 2 things.  ED course: -Afebrile, heart rate 50s, respirations and pulse oximetry normal, blood pressure 140/90 -Initial ECG showed sinus bradycardia with nonspecific repolarization abnormality and troponin was negative. -Na 140, K 3.4, Cr 1.11, WBC 7.1, Hgb 15 -CT angiogram of the abdomen was obtained because of concern for hematuria as a symptom of aortic dissection or erosion, but this showed a stable 4.3 cm ascending aortic aneurysm, without involvement of the ureters -TRH was asked to admit for observation, serial troponins and risk stratification.    The patient has had new voiding symptoms of dysuria and frequency in the last day, wtihout flank pain, fever, chills, vomiting.  He has maybe had nephroliths in the past, he is not sure.  Never had a UTI.  He smokes, has HTN, and NIDDM.  He had a stent years ago in his heart.  He is not sure of a family history of CAD.  He is followed by his PCP for his known aortic aneurysm.    He was in fact intended to have a scan today for his aorta, and referral to vascular surgery after that.      Review of Systems:  Review of Systems  Constitutional: Negative for chills, diaphoresis and fever.  Respiratory: Positive for shortness of  breath (chronic, unchanged, from asthma and smoking). Negative for cough, hemoptysis, sputum production and wheezing.   Cardiovascular: Positive for chest pain. Negative for claudication.  Gastrointestinal: Negative for abdominal pain, nausea and vomiting.  Genitourinary: Positive for dysuria, frequency and hematuria. Negative for flank pain.  Musculoskeletal: Positive for back pain.  All other systems reviewed and are negative.    Past Medical History:  Diagnosis Date  . Asthma   . Coronary artery disease   . Diabetes mellitus without complication ([place])   . History of kidney stones   . Hypertension   . Peripheral vascular disease [facility])     Past Surgical History:  Procedure Laterality Date  . heart stents  2006    Social History: Patient lives with his wife.  Patient walks unassisted.  He smokes.  He lives in [place].    Allergies  Allergen Reactions  . Erythromycin Shortness Of Breath and Swelling  . Simvastatin     CAUSE MUSCLE PAIN,.    Family history: No family history of CV disease.  Parents deceased, no medical problems.  Prior to Admission medications   Medication Sig Start Date End Date Taking? Authorizing Provider  albuterol (PROVENTIL HFA;VENTOLIN HFA) 108 (90 BASE) MCG/ACT inhaler Inhale into the lungs every 6 (six) hours as needed for wheezing or shortness of breath. Inhale 2 puffs every 6 hours as needed for breathing   Yes Historical Provider, MD  aspirin 81 MG chewable tablet Chew 81 mg by mouth daily.    Yes Historical Provider, MD  buPROPion (ZYBAN) 150 MG 12 hr tablet Take 150 mg by mouth daily. 12hr   Yes Historical Provider, MD  clopidogrel (PLAVIX) 75 MG tablet Take 75 mg by mouth daily.   Yes Historical Provider, MD  doxazosin (CARDURA) 1 MG tablet Take 1 mg by mouth daily.   Yes Historical Provider, MD  gabapentin (NEURONTIN) 300 MG capsule Take 300 mg by mouth 2 (two) times daily.   Yes Historical Provider, MD  hydroxypropyl methylcellulose /  hypromellose (ISOPTO TEARS / GONIOVISC) 2.5 % ophthalmic solution Place 1-2 drops into both eyes 3 (three) times daily as needed for dry eyes.   Yes Historical Provider, MD  isosorbide mononitrate (IMDUR) 30 MG 24 hr tablet Take 30 mg by mouth daily.   Yes Historical Provider, MD  loratadine (CLARITIN) 10 MG tablet Take 10 mg by mouth daily.   Yes Historical Provider, MD  meclizine (ANTIVERT) 25 MG tablet Take 25 mg by mouth 3 (three) times daily as needed for dizziness or nausea.    Yes Historical Provider, MD  metFORMIN (GLUCOPHAGE) 500 MG tablet Take 1,000 mg by mouth daily with breakfast.   Yes Historical Provider, MD  nitroGLYCERIN (NITROSTAT) 0.4 MG SL tablet Place 0.4 mg under the tongue every 5 (five) minutes as needed for chest pain.   Yes Historical Provider, MD  sildenafil (VIAGRA) 100 MG tablet Take 100 mg by mouth daily as needed for erectile dysfunction.   Yes Historical Provider, MD       Physical Exam: BP 174/95   Pulse (!) 51   Temp 97.7 F (36.5 C)   Resp 15   Ht '6\' 2"'$  (1.88 m)   Wt 104 kg (229 lb 4 oz)   SpO2 99%   BMI 29.43 kg/m  General appearance: Well-developed, adult male, alert and in no acute distress.   Eyes: Anicteric, conjunctiva pink, lids and lashes normal.     ENT: No nasal deformity, discharge, or epistaxis.  OP moist without lesions.   Skin: Warm and dry.   Cardiac: RRR, nl S1-S2, no murmurs appreciated.  Capillary refill is brisk.  JVP normal.  No LE edema.  Radial and DP pulses 2+ and symmetric.  No carotid bruits. Respiratory: Normal respiratory rate and rhythm.  CTAB without rales.  Scattered wheezes expiratory. GI: Abdomen soft without rigidity.  No TTP. No ascites, distension.   MSK: No deformities or effusions.   Pain not reproduced with palpation of precordium.  No pain with arm movement. Neuro: Sensorium intact and responding to questions, attention normal.  Speech is fluent.  Moves all extremities equally and with normal coordination.      Psych: Behavior appropriate.  Affect normal.  No evidence of aural or visual hallucinations or delusions.       Labs on Admission:  The metabolic panel shomild hypokalemia, otherwise normal electrolytes and renal function. The complete blood count shows no leukocytosis, anemia, thrombocytopenia. The initial troponin is negative. UA shows hematuria     Radiological Exams on Admission: Personally reviewed CXR shows no focal infiltrate.  CT abdomen report reviewed and discussed with vascular surgery: Dg Chest 2 View  Result Date: 07/02/2016 CLINICAL DATA:  Left-sided chest pain, dyspnea and cough EXAM: CHEST  2 VIEW COMPARISON:  Studies dating back through 11/04/2007 FINDINGS: Lungs are slightly hyperinflated without pneumonic consolidation. Heart is top-normal in size. The thoracic aorta is not aneurysmal. Pulmonary vasculature  is normal. There is no effusion. Tiny 3 mm density overlying the right anterior second rib is unchanged relative to 2009. Findings are consistent with a benign finding. IMPRESSION: Mild pulmonary hyperinflation without pneumonic consolidation, pneumothorax or CHF. Electronically Signed   By: Ashley Royalty M.D.   On: 07/02/2016 21:42   Ct Angio Abd/pel W And/or Wo Contrast  Result Date: 07/03/2016 CLINICAL DATA:  Patient with known aortic aneurysm complaining of mid abdominal pain radiating to both flanks and upper chest. Diabetic. EXAM: CTA ABDOMEN AND PELVIS WITHOUT AND WITH CONTRAST TECHNIQUE: Multidetector CT imaging of the abdomen and pelvis was performed using the standard protocol during bolus administration of intravenous contrast. Multiplanar reconstructed images and MIPs were obtained and reviewed to evaluate the vascular anatomy. CONTRAST:  100 cc of Isovue 370 IV COMPARISON:  MRI of the lumbar spine from 04/27/2001 report which indicated an abdominal aortic aneurysm at L3 and L4 approximately 2.8 cm transverse. FINDINGS: VASCULAR Aorta: There is an infrarenal  fusiform 4.3 cm abdominal aortic aneurysm starting approximately 4.5 cm from the right renal artery. There is soft intraluminal plaque along the posterior right lateral wall of the aneurysm narrowing the lumen up to 60%. The aneurysm terminates at the aortic bifurcation. There is no evidence of dissection or rupture. Celiac: Patent without evidence of aneurysm, dissection, vasculitis or significant stenosis. There is some minimal irregular soft and calcific plaque near its origin. SMA: Patent without evidence of aneurysm, dissection, vasculitis or significant stenosis. Minimal atherosclerosis near its origin without significant stenosis. Renals: Both renal arteries are patent without evidence of aneurysm, dissection, vasculitis, fibromuscular dysplasia or significant stenosis. IMA: Patent without evidence of aneurysm, dissection, vasculitis or significant stenosis. Inflow: Patent without evidence of aneurysm, dissection, vasculitis or significant stenosis. Proximal Outflow: The left external iliac artery demonstrates focal soft tissue plaque, series 5 image 126 narrowing the lumen by 70-80%. There is atherosclerosis along both common iliac arteries and bifurcations with ectasia of the left common iliac artery up to 1.8 cm. Both common femoral arteries appear patent. Veins: No obvious venous abnormality within the limitations of this arterial phase study. Review of the MIP images confirms the above findings. NON-VASCULAR Lower chest: Cardiac chambers are normal in size. Small hiatal hernia is seen. Hepatobiliary: No space-occupying mass of the liver. No biliary dilatation. Normal appearing gallbladder. Pancreas: Normal Spleen: Normal Adrenals/Urinary Tract: Normal Stomach/Bowel: Scattered colonic diverticulosis. No bowel obstruction or acute diverticulitis. Normal appendix. Lymphatic: No lymphadenopathy. Reproductive: Top-normal size prostate without focal mass. Other: No ascites.  No abdominal wall hernia.  Musculoskeletal: No acute osseous appearing abnormalities. IMPRESSION: VASCULAR Fusiform 4.3 cm in caliber infrarenal abdominal aortic aneurysm with plaque noted along the posterior and right lateral wall narrowing the lumen by approximately 50%. No evidence of rupture or dissection. 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 NON-VASCULAR Nonacute. Electronically Signed   By: Ashley Royalty M.D.   On: 07/03/2016 01:49    EKG: Independently reviewed. Rate 59, QTc 401, sinus rhythm, no St changes, T wave flattening in V5-6 and inferior leads, no previous for comparison.    Assessment/Plan  1. Chest pain: This is new.  The patient has HEART score of 6. Angina is typical.  Other potential causes of chest pain (PE, dissection, pancreatitis, pneumonia/effusion, pericarditis) are doubted.  We have been asked to admit the patient for observation and etiology consultation with Cardiology tomorrow.  -Aspirin 325 mg once -Serial troponins are ordered -Telemetry -Consult to  cardiology, appreciate recommendations -Smoking cessation was recommended, nursing teaching ordered   2. Hematuria:  Ddx includes infection, nephrolith, malignancy.  Given voiding symptoms, favor UTI. -Cephalexin 500 BID for 7 days -Follow culture -If negative, stop antibiotics -Repeat UA in 6 weeks, if hematuria, CT urogram recommended  3. HTN and CAD secondary prevention:  -Continue Imdur and doxazosin -Continue Plavix and baby aspirin  4. NIDDM:  -Hold metformin -SSI while in hospital -Continue gabapentin               DVT prophylaxis: Lovenox Diet: NPO after 4am for anticipated stress testing Code Status: FULL  Family Communication: Wife at bedside  Disposition Plan: Anticipate overnight observation for arrhythmia on telemetry, serial troponins and subsequent risk stratification by Cardiology.  If testing negative, home after. Consults called: Vascular  surgery Admission status: Telemetry, OBS   Medical decision making: Patient seen at 3:19 AM on 07/03/2016.  The patient was discussed with Dr. Donzetta Matters and Dr. Laneta Simmers. What exists of the patient's chart was reviewed in depth.  Clinical condition: stable, the patient will be monitored in a telemetry bed for low risk chest pain, do not recommend stepdown status, although this has been ordered per nursing protocol.      Edwin Dada [facility] Pager [phone]

## 2016-07-03 NOTE — Consult Note (Signed)
CARDIOLOGY CONSULT NOTE       Patient ID: [name] MRN: [id number] DOB/AGE: 30-Mar-1946 70 y.o.  Admit date: 07/02/2016 Referring Physician: Hartford Poli Primary Physician: No PCP Per Patient Primary Cardiologist: Irish Lack Reason for Consultation: Chest Pain  Principal Problem:   Chest pain Active Problems:   Hypokalemia   Type 2 diabetes mellitus with other circulatory complications (CODE) ([place])   Essential hypertension   Gross hematuria   HPI:  70 y.o. diabetic still smoking. On disability with PTSD served in [place] exposed to agent orange. Had PCI/stents In 2006 with Dr Irish Lack. Last seen 2009 Tends to get medical care at [facility] but not followed closely by cards. Last year Has had typical sounding angina. Exertional Especially going up hill to male box. No rest pain. Does not have nitro Still Smoking. Also has hematuria last 24 hrs. Some burning on urination Denies history of prostate issues. 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 Asthma   . Coronary artery disease   . Diabetes mellitus without complication ([place])   . History of kidney stones   . Hypertension   . Peripheral vascular disease ([place])     History reviewed. No pertinent family history.  Social History   Social History  . Marital status: Married    Spouse name: N/A  . Number of children: N/A  . Years of education: N/A   Occupational History  . Not on file.   Social History Main Topics  . Smoking status: Former Research scientist (life sciences)  . Smokeless tobacco: Never Used  . Alcohol use No  . Drug use: No  . Sexual activity: Not on file   Other Topics Concern  . Not on file   Social History Narrative  . No narrative on file    Past Surgical History:  Procedure Laterality Date  . heart stents  2006     . [START ON 07/04/2016]  aspirin  81 mg Oral Daily  . cephALEXin  500 mg Oral Q12H  . clopidogrel  75 mg Oral Daily  . doxazosin  1 mg Oral Daily  . enoxaparin (LOVENOX) injection  40 mg Subcutaneous Q24H  . gabapentin  300 mg Oral BID  . [START ON 07/04/2016] Influenza vac split quadrivalent PF  0.5 mL Intramuscular Tomorrow-1000  . insulin aspart  0-9 Units Subcutaneous Q8H  . isosorbide mononitrate  30 mg Oral Daily     Physical Exam: Blood pressure 136/74, pulse (!) 45, temperature 98.4 F (36.9 C), temperature source Oral, resp. rate 16, height '6\' 2"'$  (1.88 m), weight 101.6 kg (223 lb 14.4 oz), SpO2 97 %.    Affect appropriate Black male no distress  HEENT: normal Neck supple with no adenopathy JVP normal no bruits no thyromegaly Lungs exp wheezing and good diaphragmatic motion Heart:  S1/S2 no murmur, no rub, gallop or click PMI normal Abdomen: benighn, BS positve, no tenderness,  no bruit.  No HSM or HJR Decreased pedal pulses left foot  No edema Neuro non-focal Skin warm and dry No muscular weakness   Labs:   Lab Results  Component Value Date   WBC 7.1 07/02/2016   HGB 15.0 07/02/2016   HCT 44.8 07/02/2016   MCV 90.3 07/02/2016   PLT 167 07/02/2016    Recent Labs Lab 07/02/16 2111  NA 140  K 3.4*  CL 108  CO2 22  BUN 9  CREATININE 1.11  CALCIUM 9.2  GLUCOSE 157*   Lab Results  Component Value Date   CKTOTAL 147 03/07/2009   CKMB 1.8 03/07/2009   TROPONINI <0.03 07/03/2016    Lab Results  Component Value Date   CHOL  01/22/2008    102        ATP III CLASSIFICATION:  <200     mg/dL   Desirable  200-239  mg/dL   Borderline High  >=240    mg/dL   High   Lab Results  Component Value Date   HDL 32 (L) 01/22/2008   Lab Results  Component Value Date   LDLCALC  01/22/2008    60        Total Cholesterol/HDL:CHD Risk Coronary Heart Disease Risk Table                     Men   Women  1/2 Average Risk   3.4   3.3   Lab Results  Component Value Date   TRIG 49  01/22/2008   Lab Results  Component Value Date   CHOLHDL 3.2 01/22/2008   No results found for: LDLDIRECT    Radiology: Dg Chest 2 View  Result Date: 07/02/2016 CLINICAL DATA:  Left-sided chest pain, dyspnea and cough EXAM: CHEST  2 VIEW COMPARISON:  Studies dating back through 11/04/2007 FINDINGS: Lungs are slightly hyperinflated without pneumonic consolidation. Heart is top-normal in size. The thoracic aorta is not aneurysmal. Pulmonary vasculature is normal. There is no effusion. Tiny 3 mm density overlying the right anterior second rib is unchanged relative to 2009. Findings are consistent with a benign finding. IMPRESSION: Mild pulmonary hyperinflation without pneumonic consolidation, pneumothorax or CHF. Electronically Signed   By: Ashley Royalty M.D.   On: 07/02/2016 21:42   Ct Angio Abd/pel W And/or Wo Contrast  Result Date: 07/03/2016 CLINICAL DATA:  Patient with known aortic aneurysm complaining of mid abdominal pain radiating to both flanks and upper chest. Diabetic. EXAM: CTA ABDOMEN AND PELVIS WITHOUT AND WITH CONTRAST TECHNIQUE: Multidetector CT imaging of the abdomen and pelvis was performed using the standard protocol during bolus administration of intravenous contrast. Multiplanar reconstructed images and MIPs were obtained and reviewed to evaluate the vascular anatomy. CONTRAST:  100 cc of Isovue 370 IV COMPARISON:  MRI of the lumbar spine from 04/27/2001 report which indicated an abdominal aortic aneurysm at L3 and L4 approximately 2.8 cm transverse. FINDINGS: VASCULAR Aorta: There is an infrarenal fusiform 4.3 cm abdominal aortic aneurysm starting approximately 4.5 cm from the right renal artery. There is soft intraluminal plaque along the posterior right lateral wall of the aneurysm narrowing the lumen up to 60%. The aneurysm terminates at the aortic bifurcation. There is no evidence of dissection or rupture. Celiac: Patent without evidence of aneurysm, dissection, vasculitis or  significant stenosis. There is some minimal irregular soft and calcific plaque near its origin. SMA: Patent without evidence of aneurysm, dissection, vasculitis or significant stenosis. Minimal atherosclerosis near its origin without significant stenosis. Renals: Both renal arteries are patent without evidence of aneurysm, dissection, vasculitis, fibromuscular dysplasia or significant stenosis. IMA: Patent without evidence of aneurysm, dissection, vasculitis or significant stenosis. Inflow: Patent without evidence of aneurysm, dissection, vasculitis or significant stenosis. Proximal Outflow: The left external iliac artery demonstrates focal soft tissue plaque, series 5 image 126 narrowing the lumen by 70-80%. There is atherosclerosis along both common iliac arteries and bifurcations with ectasia  of the left common iliac artery up to 1.8 cm. Both common femoral arteries appear patent. Veins: No obvious venous abnormality within the limitations of this arterial phase study. Review of the MIP images confirms the above findings. NON-VASCULAR Lower chest: Cardiac chambers are normal in size. Small hiatal hernia is seen. Hepatobiliary: No space-occupying mass of the liver. No biliary dilatation. Normal appearing gallbladder. Pancreas: Normal Spleen: Normal Adrenals/Urinary Tract: Normal Stomach/Bowel: Scattered colonic diverticulosis. No bowel obstruction or acute diverticulitis. Normal appendix. Lymphatic: No lymphadenopathy. Reproductive: Top-normal size prostate without focal mass. Other: No ascites.  No abdominal wall hernia. Musculoskeletal: No acute osseous appearing abnormalities. IMPRESSION: VASCULAR Fusiform 4.3 cm in caliber infrarenal abdominal aortic aneurysm with plaque noted along the posterior and right lateral wall narrowing the lumen by approximately 50%. No evidence of rupture or dissection. 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 NON-VASCULAR Nonacute.  Electronically Signed   By: Ashley Royalty M.D.   On: 07/03/2016 01:49    EKG: SB rate 59 nonspecific ST/T wave changes    ASSESSMENT AND PLAN:   Chest Pain: Distant PCI/Stents 2006 will try to access cath reports not in Epic.  Fairly classic sounding angina Discussed diagnostic heart cath with patient will proceed on Monday no heparin for now given hematuria Continue ASA/plavix no need for beta blocker as HR low telemetry with high 40's to low 50's already   Hematuria. Please obtain urology consult UA PSA plan per primary service  DM: Discussed low carb diet.  Target hemoglobin A1c is 6.5 or less.  Continue current medications.  Smoking:  Counseled on cessation   Signed: Jenkins Rouge 07/03/2016, 8:22 AM

## 2016-07-03 NOTE — Consult Note (Signed)
Hospital Consult    Reason for Consult:  AAA Referring Physician:  ED MRN #:  [id number]  History of Present Illness: This is a 70 y.o. male presented to ER with chest pain with onset a few days ago. He does have a history of diabetes as well as coronary stents. He has a known abdominal aortic aneurysm for which he is followed at the [place]. He is also complaining of low back pain today and is having hematuria. CT angio was obtained and vascular consulted. Patient denies any lower extremity symptoms at this time.  Past Medical History:  Diagnosis Date  . Diabetes mellitus without complication [facility])     Past Surgical History:  Procedure Laterality Date  . heart stents  2006    Allergies  Allergen Reactions  . Erythromycin Shortness Of Breath and Swelling  . Simvastatin     CAUSE MUSCLE PAIN,.    Prior to Admission medications   Medication Sig Start Date End Date Taking? Authorizing Provider  albuterol (PROVENTIL HFA;VENTOLIN HFA) 108 (90 BASE) MCG/ACT inhaler Inhale into the lungs every 6 (six) hours as needed for wheezing or shortness of breath. Inhale 2 puffs every 6 hours as needed for breathing   Yes Historical Provider, MD  aspirin 81 MG chewable tablet Chew 81 mg by mouth daily.    Yes Historical Provider, MD  buPROPion (ZYBAN) 150 MG 12 hr tablet Take 150 mg by mouth daily. 12hr   Yes Historical Provider, MD  clopidogrel (PLAVIX) 75 MG tablet Take 75 mg by mouth daily.   Yes Historical Provider, MD  doxazosin (CARDURA) 1 MG tablet Take 1 mg by mouth daily.   Yes Historical Provider, MD  gabapentin (NEURONTIN) 300 MG capsule Take 300 mg by mouth 2 (two) times daily.   Yes Historical Provider, MD  hydroxypropyl methylcellulose / hypromellose (ISOPTO TEARS / GONIOVISC) 2.5 % ophthalmic solution Place 1-2 drops into both eyes 3 (three) times daily as needed for dry eyes.   Yes Historical Provider, MD  isosorbide mononitrate (IMDUR) 30 MG 24 hr tablet Take 30 mg by mouth daily.    Yes Historical Provider, MD  loratadine (CLARITIN) 10 MG tablet Take 10 mg by mouth daily.   Yes Historical Provider, MD  meclizine (ANTIVERT) 25 MG tablet Take 25 mg by mouth 3 (three) times daily as needed for dizziness or nausea.    Yes Historical Provider, MD  metFORMIN (GLUCOPHAGE) 500 MG tablet Take 1,000 mg by mouth daily with breakfast.   Yes Historical Provider, MD  nitroGLYCERIN (NITROSTAT) 0.4 MG SL tablet Place 0.4 mg under the tongue every 5 (five) minutes as needed for chest pain.   Yes Historical Provider, MD  sildenafil (VIAGRA) 100 MG tablet Take 100 mg by mouth daily as needed for erectile dysfunction.   Yes Historical Provider, MD    Social History   Social History  . Marital status: Married    Spouse name: N/A  . Number of children: N/A  . Years of education: N/A   Occupational History  . Not on file.   Social History Main Topics  . Smoking status: Former Research scientist (life sciences)  . Smokeless tobacco: Never Used  . Alcohol use No  . Drug use: No  . Sexual activity: Not on file   Other Topics Concern  . Not on file   Social History Narrative  . No narrative on file     No family history on file.  ROS: '[x]'$  Positive   '[ ]'$  Negative   '[ ]'$   All sytems reviewed and are negative  Cardiovascular: '[x]'$  chest pain/pressure '[]'$  palpitations '[]'$  SOB lying flat '[]'$  DOE '[]'$  pain in legs while walking '[]'$  pain in legs at rest '[]'$  pain in legs at night '[]'$  non-healing ulcers '[]'$  hx of DVT '[x]'$  swelling in legs  Pulmonary: '[]'$  productive cough '[]'$  asthma/wheezing '[]'$  home O2  Neurologic: '[]'$  weakness in '[]'$  arms '[]'$  legs '[]'$  numbness in '[]'$  arms '[]'$  legs '[]'$  hx of CVA '[]'$  mini stroke '[]'$ difficulty speaking or slurred speech '[]'$  temporary loss of vision in one eye '[]'$  dizziness  Hematologic: '[]'$  hx of cancer '[]'$  bleeding problems '[]'$  problems with blood clotting easily  Endocrine:   '[]'$  diabetes '[]'$  thyroid disease  GI '[]'$  vomiting blood '[]'$  blood in stool  GU: '[]'$  CKD/renal failure '[]'$   HD--'[]'$  M/W/F or '[]'$  T/T/S '[]'$  burning with urination '[x]'$  blood in urine  Psychiatric: '[]'$  anxiety '[]'$  depression  Musculoskeletal: '[]'$  arthritis '[x]'$  joint pain  Integumentary: '[]'$  rashes '[]'$  ulcers  Constitutional: '[]'$  fever '[]'$  chills   Physical Examination  Vitals:   07/03/16 0200 07/03/16 0215  BP: 141/90 137/86  Pulse: (!) 54 61  Resp: 17 17  Temp:     Body mass index is 29.43 kg/m.  General:  WDWN in NAD Gait: Not observed HENT: WNL, normocephalic Pulmonary: normal non-labored breathing, without Rales, rhonchi,  wheezing Cardiac: palpable femoral pulse Abdomen: soft, NT/ND, cannot palpate aaa Skin: without rashes Extremities: without ischemic changes, without Gangrene , without cellulitis; without open wounds;  Musculoskeletal: no muscle wasting or atrophy  Neurologic: A&O X 3; Appropriate Affect ; SENSATION: normal; MOTOR FUNCTION:  moving all extremities equally. Speech is fluent/normal   CBC    Component Value Date/Time   WBC 7.1 07/02/2016 2111   RBC 4.96 07/02/2016 2111   HGB 15.0 07/02/2016 2111   HCT 44.8 07/02/2016 2111   PLT 167 07/02/2016 2111   MCV 90.3 07/02/2016 2111   MCH 30.2 07/02/2016 2111   MCHC 33.5 07/02/2016 2111   RDW 14.1 07/02/2016 2111   LYMPHSABS 2.7 03/11/2009 1820   MONOABS 0.4 03/11/2009 1820   EOSABS 0.1 03/11/2009 1820   BASOSABS 0.0 03/11/2009 1820    BMET    Component Value Date/Time   NA 140 07/02/2016 2111   K 3.4 (L) 07/02/2016 2111   CL 108 07/02/2016 2111   CO2 22 07/02/2016 2111   GLUCOSE 157 (H) 07/02/2016 2111   BUN 9 07/02/2016 2111   CREATININE 1.11 07/02/2016 2111   CALCIUM 9.2 07/02/2016 2111   GFRNONAA >60 07/02/2016 2111   GFRAA >60 07/02/2016 2111    COAGS: Lab Results  Component Value Date   INR 1.0 03/06/2009   INR 1.0 01/22/2008   INR 0.9 01/21/2008     Non-Invasive Vascular Imaging:   IMPRESSION: VASCULAR  Fusiform 4.3 cm in caliber infrarenal abdominal aortic aneurysm  with plaque noted along the posterior and right lateral wall narrowing the lumen by approximately 50%. No evidence of rupture or dissection.  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   ASSESSMENT/PLAN: This is a 70 y.o. male is here with chest pain and hematuria. The AAA on CTA looks like a stable small aneurysm. No intervention at this time. [facility]  Neyla Gauntt C. Donzetta Matters, MD [facility] Office: [phone] Pager: [phone]

## 2016-07-03 NOTE — Progress Notes (Signed)
PROGRESS NOTE  [name]  IRC:[id number] DOB: October 01, 1945 DOA: 07/02/2016 PCP: No PCP Per Patient Outpatient Specialists:  Subjective: Patient denies any chest pain this morning.  Brief Narrative:  [name] is a 70 y.o. male with a past medical history significant for CAD s/p PCI years ago, HTN, NIDDM, and smoking who presents with chest pain. The patient was in his usual state of health until about the last 48 hours when he developed a new left-sided chest discomfort, radiating to the left shoulder neck and arm. This was worsened with exertion, relieved with rest, and associated with nausea. Today, he had an episode of gross hematuria and so he came to the ER for the comminution of these 2 things.  Assessment & Plan:   Principal Problem:   Chest pain Active Problems:   Hypokalemia   Type 2 diabetes mellitus with other circulatory complications (CODE) (HCC)   Essential hypertension   Gross hematuria   This is a no charge note, patient seen and examined, and data base reviewed. Patient seen earlier today by my colleague Dr. Loleta Books. Presented with what appears to be stable angina, remote stent, patient is on aspirin and Plavix. Gross hematuria, initially reported pain, but currently painless. Obtain CT stone study, based on the results we'll see if he can benefit from neurological consultation or not.   DVT prophylaxis:  Code Status: Full Code Family Communication:  Disposition Plan:  Diet: Diet NPO time specified Diet heart healthy/carb modified Room service appropriate? Yes; Fluid consistency: Thin  Consultants:   Cardiology  Procedures:   None  Antimicrobials:   None   Objective: Vitals:   07/03/16 0400 07/03/16 0405 07/03/16 0744 07/03/16 0837  BP:  (!) 179/88 136/74 (!) 154/81  Pulse:  (!) 57 (!) 45 (!) 48  Resp:  '16 16 18  '$ Temp:  97.7 F (36.5 C) 98.4 F (36.9 C) 98.3 F (36.8 C)  TempSrc:  Oral Oral Oral  SpO2:  100% 97% 97%  Weight: 101.6  kg (223 lb 14.4 oz)     Height: '6\' 2"'$  (1.88 m)       Intake/Output Summary (Last 24 hours) at 07/03/16 1056 Last data filed at 07/03/16 0800  Gross per 24 hour  Intake                0 ml  Output              300 ml  Net             -300 ml   Filed Weights   07/02/16 2108 07/03/16 0400  Weight: 104 kg (229 lb 4 oz) 101.6 kg (223 lb 14.4 oz)    Examination: General exam: Appears calm and comfortable  Respiratory system: Clear to auscultation. Respiratory effort normal. Cardiovascular system: S1 & S2 heard, RRR. No JVD, murmurs, rubs, gallops or clicks. No pedal edema. Gastrointestinal system: Abdomen is nondistended, soft and nontender. No organomegaly or masses felt. Normal bowel sounds heard. Central nervous system: Alert and oriented. No focal neurological deficits. Extremities: Symmetric 5 x 5 power. Skin: No rashes, lesions or ulcers Psychiatry: Judgement and insight appear normal. Mood & affect appropriate.   Data Reviewed: I have personally reviewed following labs and imaging studies  CBC:  Recent Labs Lab 07/02/16 2111  WBC 7.1  HGB 15.0  HCT 44.8  MCV 90.3  PLT 510   Basic Metabolic Panel:  Recent Labs Lab 07/02/16 2111 07/03/16 0448  NA 140  --   K  3.4*  --   CL 108  --   CO2 22  --   GLUCOSE 157*  --   BUN 9  --   CREATININE 1.11  --   CALCIUM 9.2  --   MG  --  1.9   GFR: Estimated Creatinine Clearance: 80 mL/min (by C-G formula based on SCr of 1.11 mg/dL). Liver Function Tests: No results for input(s): AST, ALT, ALKPHOS, BILITOT, PROT, ALBUMIN in the last 168 hours. No results for input(s): LIPASE, AMYLASE in the last 168 hours. No results for input(s): AMMONIA in the last 168 hours. Coagulation Profile: No results for input(s): INR, PROTIME in the last 168 hours. Cardiac Enzymes:  Recent Labs Lab 07/02/16 2111 07/03/16 0448 07/03/16 0710  TROPONINI <0.03 <0.03 <0.03   BNP (last 3 results) No results for input(s): PROBNP in the last  8760 hours. HbA1C: No results for input(s): HGBA1C in the last 72 hours. CBG:  Recent Labs Lab 07/03/16 0828  GLUCAP 111*   Lipid Profile: No results for input(s): CHOL, HDL, LDLCALC, TRIG, CHOLHDL, LDLDIRECT in the last 72 hours. Thyroid Function Tests: No results for input(s): TSH, T4TOTAL, FREET4, T3FREE, THYROIDAB in the last 72 hours. Anemia Panel: No results for input(s): VITAMINB12, FOLATE, FERRITIN, TIBC, IRON, RETICCTPCT in the last 72 hours. Urine analysis:    Component Value Date/Time   COLORURINE YELLOW 07/02/2016 2112   APPEARANCEUR CLEAR 07/02/2016 2112   LABSPEC 1.006 07/02/2016 2112   PHURINE 5.5 07/02/2016 2112   GLUCOSEU NEGATIVE 07/02/2016 2112   HGBUR LARGE (A) 07/02/2016 2112   BILIRUBINUR NEGATIVE 07/02/2016 2112   KETONESUR NEGATIVE 07/02/2016 2112   PROTEINUR NEGATIVE 07/02/2016 2112   NITRITE NEGATIVE 07/02/2016 2112   LEUKOCYTESUR TRACE (A) 07/02/2016 2112   Sepsis Labs: '@LABRCNTIP'$ (procalcitonin:4,lacticidven:4)  ) Recent Results (from the past 240 hour(s))  MRSA PCR Screening     Status: None   Collection Time: 07/03/16  4:15 AM  Result Value Ref Range Status   MRSA by PCR NEGATIVE NEGATIVE Final    Comment:        The GeneXpert MRSA Assay (FDA approved for NASAL specimens only), is one component of a comprehensive MRSA colonization surveillance program. It is not intended to diagnose MRSA infection nor to guide or monitor treatment for MRSA infections.      Invalid input(s): PROCALCITONIN, LACTICACIDVEN   Radiology Studies: Dg Chest 2 View  Result Date: 07/02/2016 CLINICAL DATA:  Left-sided chest pain, dyspnea and cough EXAM: CHEST  2 VIEW COMPARISON:  Studies dating back through 11/04/2007 FINDINGS: Lungs are slightly hyperinflated without pneumonic consolidation. Heart is top-normal in size. The thoracic aorta is not aneurysmal. Pulmonary vasculature is normal. There is no effusion. Tiny 3 mm density overlying the right  anterior second rib is unchanged relative to 2009. Findings are consistent with a benign finding. IMPRESSION: Mild pulmonary hyperinflation without pneumonic consolidation, pneumothorax or CHF. Electronically Signed   By: Ashley Royalty M.D.   On: 07/02/2016 21:42   Ct Angio Abd/pel W And/or Wo Contrast  Result Date: 07/03/2016 CLINICAL DATA:  Patient with known aortic aneurysm complaining of mid abdominal pain radiating to both flanks and upper chest. Diabetic. EXAM: CTA ABDOMEN AND PELVIS WITHOUT AND WITH CONTRAST TECHNIQUE: Multidetector CT imaging of the abdomen and pelvis was performed using the standard protocol during bolus administration of intravenous contrast. Multiplanar reconstructed images and MIPs were obtained and reviewed to evaluate the vascular anatomy. CONTRAST:  100 cc of Isovue 370 IV COMPARISON:  MRI of the  lumbar spine from 04/27/2001 report which indicated an abdominal aortic aneurysm at L3 and L4 approximately 2.8 cm transverse. FINDINGS: VASCULAR Aorta: There is an infrarenal fusiform 4.3 cm abdominal aortic aneurysm starting approximately 4.5 cm from the right renal artery. There is soft intraluminal plaque along the posterior right lateral wall of the aneurysm narrowing the lumen up to 60%. The aneurysm terminates at the aortic bifurcation. There is no evidence of dissection or rupture. Celiac: Patent without evidence of aneurysm, dissection, vasculitis or significant stenosis. There is some minimal irregular soft and calcific plaque near its origin. SMA: Patent without evidence of aneurysm, dissection, vasculitis or significant stenosis. Minimal atherosclerosis near its origin without significant stenosis. Renals: Both renal arteries are patent without evidence of aneurysm, dissection, vasculitis, fibromuscular dysplasia or significant stenosis. IMA: Patent without evidence of aneurysm, dissection, vasculitis or significant stenosis. Inflow: Patent without evidence of aneurysm,  dissection, vasculitis or significant stenosis. Proximal Outflow: The left external iliac artery demonstrates focal soft tissue plaque, series 5 image 126 narrowing the lumen by 70-80%. There is atherosclerosis along both common iliac arteries and bifurcations with ectasia of the left common iliac artery up to 1.8 cm. Both common femoral arteries appear patent. Veins: No obvious venous abnormality within the limitations of this arterial phase study. Review of the MIP images confirms the above findings. NON-VASCULAR Lower chest: Cardiac chambers are normal in size. Small hiatal hernia is seen. Hepatobiliary: No space-occupying mass of the liver. No biliary dilatation. Normal appearing gallbladder. Pancreas: Normal Spleen: Normal Adrenals/Urinary Tract: Normal Stomach/Bowel: Scattered colonic diverticulosis. No bowel obstruction or acute diverticulitis. Normal appendix. Lymphatic: No lymphadenopathy. Reproductive: Top-normal size prostate without focal mass. Other: No ascites.  No abdominal wall hernia. Musculoskeletal: No acute osseous appearing abnormalities. IMPRESSION: VASCULAR Fusiform 4.3 cm in caliber infrarenal abdominal aortic aneurysm with plaque noted along the posterior and right lateral wall narrowing the lumen by approximately 50%. No evidence of rupture or dissection. 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 NON-VASCULAR Nonacute. Electronically Signed   By: Ashley Royalty M.D.   On: 07/03/2016 01:49        Scheduled Meds: . [START ON 07/04/2016] aspirin  81 mg Oral Daily  . cephALEXin  500 mg Oral Q12H  . clopidogrel  75 mg Oral Daily  . doxazosin  1 mg Oral Daily  . enoxaparin (LOVENOX) injection  40 mg Subcutaneous Q24H  . gabapentin  300 mg Oral BID  . [START ON 07/04/2016] Influenza vac split quadrivalent PF  0.5 mL Intramuscular Tomorrow-1000  . insulin aspart  0-9 Units Subcutaneous Q8H  . isosorbide mononitrate  30 mg Oral Daily    Continuous Infusions:   LOS: 0 days    Time spent: 35 minutes    Starr Urias A, MD [facility] Pager [phone]  If 7PM-7AM, please contact night-coverage [email or web address] Password [email or web address] 07/03/2016, 10:56 AM

## 2016-07-03 NOTE — ED Notes (Signed)
Attempted to call report

## 2016-07-04 DIAGNOSIS — R310 Gross hematuria: Secondary | ICD-10-CM

## 2016-07-04 DIAGNOSIS — E876 Hypokalemia: Secondary | ICD-10-CM

## 2016-07-04 DIAGNOSIS — E1159 Type 2 diabetes mellitus with other circulatory complications: Secondary | ICD-10-CM

## 2016-07-04 DIAGNOSIS — I10 Essential (primary) hypertension: Secondary | ICD-10-CM

## 2016-07-04 LAB — GLUCOSE, CAPILLARY
GLUCOSE-CAPILLARY: 151 mg/dL — AB (ref 65–99)
Glucose-Capillary: 114 mg/dL — ABNORMAL HIGH (ref 65–99)
Glucose-Capillary: 133 mg/dL — ABNORMAL HIGH (ref 65–99)
Glucose-Capillary: 214 mg/dL — ABNORMAL HIGH (ref 65–99)

## 2016-07-04 LAB — CBC
HEMATOCRIT: 40.6 % (ref 39.0–52.0)
Hemoglobin: 13.1 g/dL (ref 13.0–17.0)
MCH: 29.2 pg (ref 26.0–34.0)
MCHC: 32.3 g/dL (ref 30.0–36.0)
MCV: 90.4 fL (ref 78.0–100.0)
PLATELETS: 150 10*3/uL (ref 150–400)
RBC: 4.49 MIL/uL (ref 4.22–5.81)
RDW: 14.4 % (ref 11.5–15.5)
WBC: 5.1 10*3/uL (ref 4.0–10.5)

## 2016-07-04 LAB — URINE CULTURE: CULTURE: NO GROWTH

## 2016-07-04 LAB — BASIC METABOLIC PANEL
Anion gap: 6 (ref 5–15)
BUN: 9 mg/dL (ref 6–20)
CALCIUM: 8.7 mg/dL — AB (ref 8.9–10.3)
CO2: 24 mmol/L (ref 22–32)
CREATININE: 1.01 mg/dL (ref 0.61–1.24)
Chloride: 111 mmol/L (ref 101–111)
Glucose, Bld: 127 mg/dL — ABNORMAL HIGH (ref 65–99)
Potassium: 3.6 mmol/L (ref 3.5–5.1)
Sodium: 141 mmol/L (ref 135–145)

## 2016-07-04 MED ORDER — SODIUM CHLORIDE 0.9% FLUSH
3.0000 mL | Freq: Two times a day (BID) | INTRAVENOUS | Status: DC
Start: 2016-07-04 — End: 2016-07-05
  Administered 2016-07-04: 3 mL via INTRAVENOUS

## 2016-07-04 MED ORDER — ASPIRIN 81 MG PO CHEW
81.0000 mg | CHEWABLE_TABLET | ORAL | Status: AC
  Administered 2016-07-05: 81 mg via ORAL
  Filled 2016-07-04: qty 1

## 2016-07-04 MED ORDER — SODIUM CHLORIDE 0.9 % IV SOLN: 250.0000 mL | INTRAVENOUS | Status: DC | PRN

## 2016-07-04 MED ORDER — SODIUM CHLORIDE 0.9 % WEIGHT BASED INFUSION
1.0000 mL/kg/h | INTRAVENOUS | Status: DC
Start: 2016-07-05 — End: 2016-07-05

## 2016-07-04 MED ORDER — SODIUM CHLORIDE 0.9 % WEIGHT BASED INFUSION: 3.0000 mL/kg/h | INTRAVENOUS | Status: AC

## 2016-07-04 MED ORDER — SODIUM CHLORIDE 0.9% FLUSH: 3.0000 mL | INTRAVENOUS | Status: DC | PRN

## 2016-07-04 MED ORDER — POTASSIUM CHLORIDE CRYS ER 20 MEQ PO TBCR
40.0000 meq | EXTENDED_RELEASE_TABLET | Freq: Once | ORAL | Status: AC
  Administered 2016-07-04: 40 meq via ORAL
  Filled 2016-07-04: qty 2

## 2016-07-04 NOTE — Progress Notes (Signed)
PROGRESS NOTE  [name]  EHU:[id number] DOB: 1946-06-28 DOA: 07/02/2016 PCP: No PCP Per Patient Outpatient Specialists:  Subjective: Patient denies any chest pain this morning.  Brief Narrative:  [name] is a 70 y.o. male with a past medical history significant for CAD s/p PCI years ago, HTN, NIDDM, and smoking who presents with chest pain. The patient was in his usual state of health until about the last 48 hours when he developed a new left-sided chest discomfort, radiating to the left shoulder neck and arm. This was worsened with exertion, relieved with rest, and associated with nausea. Today, he had an episode of gross hematuria and so he came to the ER for the comminution of these 2 things.  Assessment & Plan:   Principal Problem:   Chest pain Active Problems:   Hypokalemia   Type 2 diabetes mellitus with other circulatory complications (CODE) (HCC)   Essential hypertension   Gross hematuria   Chest pain: -Typical chest pain, substernal, pressure-like and resolves with rest. -His pain comes with exertion and resolve with rest, classic stable angina symptoms -The patient has HEART score of 6. -Seen by cardiology recommended heart catheter to be done on Monday.  Hematuria:  -Ddx includes infection, nephrolith, malignancy.  Given voiding symptoms, favor UTI. -Cephalexin 500 BID for 7 days -CT urogram done and showed no evidence of nephroliths or obstruction. -Patient reported that hematuria is clearing up. This is likely secondary to aspirin and Plavix.  HTN and CAD secondary prevention:  -Continue Imdur and doxazosin -Continue Plavix and baby aspirin  NIDDM:  -Hold metformin -SSI while in hospital -Continue gabapentin   DVT prophylaxis: Lovenox Code Status: Full Code Family Communication: Plan discussed with the patient in the presence of his wife at bedside Disposition Plan:  Diet: Diet NPO time specified Diet heart healthy/carb modified Room  service appropriate? Yes; Fluid consistency: Thin  Consultants:   Cardiology  Procedures:   None  Antimicrobials:   None   Objective: Vitals:   07/03/16 1624 07/03/16 2033 07/04/16 0616 07/04/16 0819  BP: 127/73 139/80 (!) 147/84   Pulse: (!) 55 (!) 44 (!) 49   Resp: (!) 24 (!) 23 (!) 24   Temp: 97.7 F (36.5 C) 97.8 F (36.6 C) 98.3 F (36.8 C) 98.2 F (36.8 C)  TempSrc: Oral Oral Oral Oral  SpO2: 97% 99% 94%   Weight:   102.2 kg (225 lb 4.8 oz)   Height:        Intake/Output Summary (Last 24 hours) at 07/04/16 0955 Last data filed at 07/04/16 0616  Gross per 24 hour  Intake           2781.5 ml  Output             2200 ml  Net            581.5 ml   Filed Weights   07/02/16 2108 07/03/16 0400 07/04/16 0616  Weight: 104 kg (229 lb 4 oz) 101.6 kg (223 lb 14.4 oz) 102.2 kg (225 lb 4.8 oz)    Examination: General exam: Appears calm and comfortable  Respiratory system: Clear to auscultation. Respiratory effort normal. Cardiovascular system: S1 & S2 heard, RRR. No JVD, murmurs, rubs, gallops or clicks. No pedal edema. Gastrointestinal system: Abdomen is nondistended, soft and nontender. No organomegaly or masses felt. Normal bowel sounds heard. Central nervous system: Alert and oriented. No focal neurological deficits. Extremities: Symmetric 5 x 5 power. Skin: No rashes, lesions or ulcers Psychiatry: Judgement and  insight appear normal. Mood & affect appropriate.   Data Reviewed: I have personally reviewed following labs and imaging studies  CBC:  Recent Labs Lab 07/02/16 2111 07/04/16 0349  WBC 7.1 5.1  HGB 15.0 13.1  HCT 44.8 40.6  MCV 90.3 90.4  PLT 167 161   Basic Metabolic Panel:  Recent Labs Lab 07/02/16 2111 07/03/16 0448 07/04/16 0349  NA 140  --  141  K 3.4*  --  3.6  CL 108  --  111  CO2 22  --  24  GLUCOSE 157*  --  127*  BUN 9  --  9  CREATININE 1.11  --  1.01  CALCIUM 9.2  --  8.7*  MG  --  1.9  --    GFR: Estimated  Creatinine Clearance: 88.1 mL/min (by C-G formula based on SCr of 1.01 mg/dL). Liver Function Tests: No results for input(s): AST, ALT, ALKPHOS, BILITOT, PROT, ALBUMIN in the last 168 hours. No results for input(s): LIPASE, AMYLASE in the last 168 hours. No results for input(s): AMMONIA in the last 168 hours. Coagulation Profile: No results for input(s): INR, PROTIME in the last 168 hours. Cardiac Enzymes:  Recent Labs Lab 07/02/16 2111 07/03/16 0448 07/03/16 0710  TROPONINI <0.03 <0.03 <0.03   BNP (last 3 results) No results for input(s): PROBNP in the last 8760 hours. HbA1C: No results for input(s): HGBA1C in the last 72 hours. CBG:  Recent Labs Lab 07/03/16 0828 07/03/16 1109 07/03/16 1631 07/04/16 0013 07/04/16 0731  GLUCAP 111* 246* 229* 114* 214*   Lipid Profile: No results for input(s): CHOL, HDL, LDLCALC, TRIG, CHOLHDL, LDLDIRECT in the last 72 hours. Thyroid Function Tests: No results for input(s): TSH, T4TOTAL, FREET4, T3FREE, THYROIDAB in the last 72 hours. Anemia Panel: No results for input(s): VITAMINB12, FOLATE, FERRITIN, TIBC, IRON, RETICCTPCT in the last 72 hours. Urine analysis:    Component Value Date/Time   COLORURINE YELLOW 07/02/2016 2112   APPEARANCEUR CLEAR 07/02/2016 2112   LABSPEC 1.006 07/02/2016 2112   PHURINE 5.5 07/02/2016 2112   GLUCOSEU NEGATIVE 07/02/2016 2112   HGBUR LARGE (A) 07/02/2016 2112   BILIRUBINUR NEGATIVE 07/02/2016 2112   KETONESUR NEGATIVE 07/02/2016 2112   PROTEINUR NEGATIVE 07/02/2016 2112   NITRITE NEGATIVE 07/02/2016 2112   LEUKOCYTESUR TRACE (A) 07/02/2016 2112   Sepsis Labs: '@LABRCNTIP'$ (procalcitonin:4,lacticidven:4)  ) Recent Results (from the past 240 hour(s))  Urine culture     Status: None   Collection Time: 07/02/16  9:12 PM  Result Value Ref Range Status   Specimen Description URINE, RANDOM  Final   Special Requests NONE  Final   Culture NO GROWTH  Final   Report Status 07/04/2016 FINAL  Final    MRSA PCR Screening     Status: None   Collection Time: 07/03/16  4:15 AM  Result Value Ref Range Status   MRSA by PCR NEGATIVE NEGATIVE Final    Comment:        The GeneXpert MRSA Assay (FDA approved for NASAL specimens only), is one component of a comprehensive MRSA colonization surveillance program. It is not intended to diagnose MRSA infection nor to guide or monitor treatment for MRSA infections.      Invalid input(s): PROCALCITONIN, LACTICACIDVEN   Radiology Studies: Dg Chest 2 View  Result Date: 07/02/2016 CLINICAL DATA:  Left-sided chest pain, dyspnea and cough EXAM: CHEST  2 VIEW COMPARISON:  Studies dating back through 11/04/2007 FINDINGS: Lungs are slightly hyperinflated without pneumonic consolidation. Heart is top-normal in size. The  thoracic aorta is not aneurysmal. Pulmonary vasculature is normal. There is no effusion. Tiny 3 mm density overlying the right anterior second rib is unchanged relative to 2009. Findings are consistent with a benign finding. IMPRESSION: Mild pulmonary hyperinflation without pneumonic consolidation, pneumothorax or CHF. Electronically Signed   By: Ashley Royalty M.D.   On: 07/02/2016 21:42   Ct Renal Stone Study  Result Date: 07/04/2016 CLINICAL DATA:  Chest and shoulder pain. History of renal stones. Hematuria. EXAM: CT ABDOMEN AND PELVIS WITHOUT CONTRAST TECHNIQUE: Multidetector CT imaging of the abdomen and pelvis was performed following the standard protocol without IV contrast. COMPARISON:  07/03/2016 CT FINDINGS: Lower chest: Normal visualized cardiac chamber size. No pericardial effusion. There is coronary arteriosclerosis. Small hiatal hernia is present. Left lower lobe pulmonary scarring. Atelectasis in the right lower lobe. Hepatobiliary: The unenhanced liver, gallbladder and biliary tree are unremarkable. No space-occupying mass is noted. Pancreas: Normal Spleen: Normal Adrenals/Urinary Tract: The adrenal glands are unremarkable. No  nephrolithiasis nor obstructive uropathy. No hydroureteronephrosis. The urinary bladder is free of stones. Stomach/Bowel: There is normal bowel rotation. No acute inflammation or bowel obstruction. Vascular/Lymphatic: There is aortic atherosclerosis. Fusiform slightly tortuous infrarenal abdominal aortic aneurysm is unchanged measuring 4.3 cm transverse. No leak. No adenopathy. Reproductive: Top-normal size prostate Other: No ascites or abdominal wall hernia. Musculoskeletal: No acute osseous abnormality. IMPRESSION: No nephrolithiasis nor obstructive uropathy. No acute bowel inflammation. 4.3 cm transverse infrarenal abdominal aortic aneurysm. Electronically Signed   By: Ashley Royalty M.D.   On: 07/04/2016 00:23   Ct Angio Abd/pel W And/or Wo Contrast  Result Date: 07/03/2016 CLINICAL DATA:  Patient with known aortic aneurysm complaining of mid abdominal pain radiating to both flanks and upper chest. Diabetic. EXAM: CTA ABDOMEN AND PELVIS WITHOUT AND WITH CONTRAST TECHNIQUE: Multidetector CT imaging of the abdomen and pelvis was performed using the standard protocol during bolus administration of intravenous contrast. Multiplanar reconstructed images and MIPs were obtained and reviewed to evaluate the vascular anatomy. CONTRAST:  100 cc of Isovue 370 IV COMPARISON:  MRI of the lumbar spine from 04/27/2001 report which indicated an abdominal aortic aneurysm at L3 and L4 approximately 2.8 cm transverse. FINDINGS: VASCULAR Aorta: There is an infrarenal fusiform 4.3 cm abdominal aortic aneurysm starting approximately 4.5 cm from the right renal artery. There is soft intraluminal plaque along the posterior right lateral wall of the aneurysm narrowing the lumen up to 60%. The aneurysm terminates at the aortic bifurcation. There is no evidence of dissection or rupture. Celiac: Patent without evidence of aneurysm, dissection, vasculitis or significant stenosis. There is some minimal irregular soft and calcific plaque  near its origin. SMA: Patent without evidence of aneurysm, dissection, vasculitis or significant stenosis. Minimal atherosclerosis near its origin without significant stenosis. Renals: Both renal arteries are patent without evidence of aneurysm, dissection, vasculitis, fibromuscular dysplasia or significant stenosis. IMA: Patent without evidence of aneurysm, dissection, vasculitis or significant stenosis. Inflow: Patent without evidence of aneurysm, dissection, vasculitis or significant stenosis. Proximal Outflow: The left external iliac artery demonstrates focal soft tissue plaque, series 5 image 126 narrowing the lumen by 70-80%. There is atherosclerosis along both common iliac arteries and bifurcations with ectasia of the left common iliac artery up to 1.8 cm. Both common femoral arteries appear patent. Veins: No obvious venous abnormality within the limitations of this arterial phase study. Review of the MIP images confirms the above findings. NON-VASCULAR Lower chest: Cardiac chambers are normal in size. Small hiatal hernia is seen. Hepatobiliary: No space-occupying  mass of the liver. No biliary dilatation. Normal appearing gallbladder. Pancreas: Normal Spleen: Normal Adrenals/Urinary Tract: Normal Stomach/Bowel: Scattered colonic diverticulosis. No bowel obstruction or acute diverticulitis. Normal appendix. Lymphatic: No lymphadenopathy. Reproductive: Top-normal size prostate without focal mass. Other: No ascites.  No abdominal wall hernia. Musculoskeletal: No acute osseous appearing abnormalities. IMPRESSION: VASCULAR Fusiform 4.3 cm in caliber infrarenal abdominal aortic aneurysm with plaque noted along the posterior and right lateral wall narrowing the lumen by approximately 50%. No evidence of rupture or dissection. 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 NON-VASCULAR Nonacute. Electronically Signed   By: Ashley Royalty M.D.   On: 07/03/2016 01:49         Scheduled Meds: . aspirin  81 mg Oral Daily  . cephALEXin  500 mg Oral Q12H  . clopidogrel  75 mg Oral Daily  . doxazosin  1 mg Oral Daily  . enoxaparin (LOVENOX) injection  40 mg Subcutaneous Q24H  . gabapentin  300 mg Oral BID  . Influenza vac split quadrivalent PF  0.5 mL Intramuscular Tomorrow-1000  . insulin aspart  0-9 Units Subcutaneous Q8H  . isosorbide mononitrate  30 mg Oral Daily   Continuous Infusions: . sodium chloride 75 mL/hr at 07/04/16 0148     LOS: 1 day    Time spent: 35 minutes    Itzayanna Kaster A, MD [facility] Pager [phone]  If 7PM-7AM, please contact night-coverage [email or web address] Password [email or web address] 07/04/2016, 9:55 AM

## 2016-07-04 NOTE — Progress Notes (Signed)
Patient ID: Tayt Moyers, male   DOB: 03-Jul-1946, 70 y.o.   MRN: [id number]   Patient Name: Dorean Daniello Date of Encounter: 07/04/2016  Primary Cardiologist: [facility] Problem List     Principal Problem:   Chest pain Active Problems:   Hypokalemia   Type 2 diabetes mellitus with other circulatory complications (CODE) (HCC)   Essential hypertension   Gross hematuria     Subjective   No chest pain no hematuria  Inpatient Medications    Scheduled Meds: . aspirin  81 mg Oral Daily  . cephALEXin  500 mg Oral Q12H  . clopidogrel  75 mg Oral Daily  . doxazosin  1 mg Oral Daily  . enoxaparin (LOVENOX) injection  40 mg Subcutaneous Q24H  . gabapentin  300 mg Oral BID  . Influenza vac split quadrivalent PF  0.5 mL Intramuscular Tomorrow-1000  . insulin aspart  0-9 Units Subcutaneous Q8H  . isosorbide mononitrate  30 mg Oral Daily   Continuous Infusions: . sodium chloride 75 mL/hr at 07/04/16 0148   PRN Meds: acetaminophen, gi cocktail, nitroGLYCERIN, ondansetron (ZOFRAN) IV   Vital Signs    Vitals:   07/03/16 1624 07/03/16 2033 07/04/16 0616 07/04/16 0819  BP: 127/73 139/80 (!) 147/84   Pulse: (!) 55 (!) 44 (!) 49   Resp: (!) 24 (!) 23 (!) 24   Temp: 97.7 F (36.5 C) 97.8 F (36.6 C) 98.3 F (36.8 C) 98.2 F (36.8 C)  TempSrc: Oral Oral Oral Oral  SpO2: 97% 99% 94%   Weight:   102.2 kg (225 lb 4.8 oz)   Height:        Intake/Output Summary (Last 24 hours) at 07/04/16 0841 Last data filed at 07/04/16 0616  Gross per 24 hour  Intake           2781.5 ml  Output             2200 ml  Net            581.5 ml   Filed Weights   07/02/16 2108 07/03/16 0400 07/04/16 0616  Weight: 104 kg (229 lb 4 oz) 101.6 kg (223 lb 14.4 oz) 102.2 kg (225 lb 4.8 oz)    Physical Exam    GEN: Well nourished, well developed, in no acute distress.  HEENT: Grossly normal.  Neck: Supple, no JVD, carotid bruits, or masses. Cardiac: RRR, no murmurs, rubs, or gallops. No  clubbing, cyanosis, edema.  Radials/DP/PT 2+ and equal bilaterally.  Respiratory:  Respirations regular and unlabored, clear to auscultation bilaterally. GI: Soft, nontender, nondistended, BS + x 4. MS: no deformity or atrophy. Skin: warm and dry, no rash. Neuro:  Strength and sensation are intact. Psych: AAOx3.  Normal affect.  Labs    CBC  Recent Labs  07/02/16 2111 07/04/16 0349  WBC 7.1 5.1  HGB 15.0 13.1  HCT 44.8 40.6  MCV 90.3 90.4  PLT 167 175   Basic Metabolic Panel  Recent Labs  07/02/16 2111 07/03/16 0448 07/04/16 0349  NA 140  --  141  K 3.4*  --  3.6  CL 108  --  111  CO2 22  --  24  GLUCOSE 157*  --  127*  BUN 9  --  9  CREATININE 1.11  --  1.01  CALCIUM 9.2  --  8.7*  MG  --  1.9  --    Liver Function Tests No results for input(s): AST, ALT, ALKPHOS, BILITOT, PROT, ALBUMIN in the  last 72 hours. No results for input(s): LIPASE, AMYLASE in the last 72 hours. Cardiac Enzymes  Recent Labs  07/02/16 2111 07/03/16 0448 07/03/16 0710  TROPONINI <0.03 <0.03 <0.03   BNP Invalid input(s): POCBNP D-Dimer No results for input(s): DDIMER in the last 72 hours. Hemoglobin A1C No results for input(s): HGBA1C in the last 72 hours. Fasting Lipid Panel No results for input(s): CHOL, HDL, LDLCALC, TRIG, CHOLHDL, LDLDIRECT in the last 72 hours. Thyroid Function Tests No results for input(s): TSH, T4TOTAL, T3FREE, THYROIDAB in the last 72 hours.  Invalid input(s): FREET3  Telemetry    NSR  - Personally Reviewed  ECG    NSR no acute ST changes  - Personally Reviewed  Radiology    Dg Chest 2 View  Result Date: 07/02/2016 CLINICAL DATA:  Left-sided chest pain, dyspnea and cough EXAM: CHEST  2 VIEW COMPARISON:  Studies dating back through 11/04/2007 FINDINGS: Lungs are slightly hyperinflated without pneumonic consolidation. Heart is top-normal in size. The thoracic aorta is not aneurysmal. Pulmonary vasculature is normal. There is no effusion. Tiny 3  mm density overlying the right anterior second rib is unchanged relative to 2009. Findings are consistent with a benign finding. IMPRESSION: Mild pulmonary hyperinflation without pneumonic consolidation, pneumothorax or CHF. Electronically Signed   By: Ashley Royalty M.D.   On: 07/02/2016 21:42   Ct Renal Stone Study  Result Date: 07/04/2016 CLINICAL DATA:  Chest and shoulder pain. History of renal stones. Hematuria. EXAM: CT ABDOMEN AND PELVIS WITHOUT CONTRAST TECHNIQUE: Multidetector CT imaging of the abdomen and pelvis was performed following the standard protocol without IV contrast. COMPARISON:  07/03/2016 CT FINDINGS: Lower chest: Normal visualized cardiac chamber size. No pericardial effusion. There is coronary arteriosclerosis. Small hiatal hernia is present. Left lower lobe pulmonary scarring. Atelectasis in the right lower lobe. Hepatobiliary: The unenhanced liver, gallbladder and biliary tree are unremarkable. No space-occupying mass is noted. Pancreas: Normal Spleen: Normal Adrenals/Urinary Tract: The adrenal glands are unremarkable. No nephrolithiasis nor obstructive uropathy. No hydroureteronephrosis. The urinary bladder is free of stones. Stomach/Bowel: There is normal bowel rotation. No acute inflammation or bowel obstruction. Vascular/Lymphatic: There is aortic atherosclerosis. Fusiform slightly tortuous infrarenal abdominal aortic aneurysm is unchanged measuring 4.3 cm transverse. No leak. No adenopathy. Reproductive: Top-normal size prostate Other: No ascites or abdominal wall hernia. Musculoskeletal: No acute osseous abnormality. IMPRESSION: No nephrolithiasis nor obstructive uropathy. No acute bowel inflammation. 4.3 cm transverse infrarenal abdominal aortic aneurysm. Electronically Signed   By: Ashley Royalty M.D.   On: 07/04/2016 00:23   Ct Angio Abd/pel W And/or Wo Contrast  Result Date: 07/03/2016 CLINICAL DATA:  Patient with known aortic aneurysm complaining of mid abdominal pain  radiating to both flanks and upper chest. Diabetic. EXAM: CTA ABDOMEN AND PELVIS WITHOUT AND WITH CONTRAST TECHNIQUE: Multidetector CT imaging of the abdomen and pelvis was performed using the standard protocol during bolus administration of intravenous contrast. Multiplanar reconstructed images and MIPs were obtained and reviewed to evaluate the vascular anatomy. CONTRAST:  100 cc of Isovue 370 IV COMPARISON:  MRI of the lumbar spine from 04/27/2001 report which indicated an abdominal aortic aneurysm at L3 and L4 approximately 2.8 cm transverse. FINDINGS: VASCULAR Aorta: There is an infrarenal fusiform 4.3 cm abdominal aortic aneurysm starting approximately 4.5 cm from the right renal artery. There is soft intraluminal plaque along the posterior right lateral wall of the aneurysm narrowing the lumen up to 60%. The aneurysm terminates at the aortic bifurcation. There is no evidence of dissection or  rupture. Celiac: Patent without evidence of aneurysm, dissection, vasculitis or significant stenosis. There is some minimal irregular soft and calcific plaque near its origin. SMA: Patent without evidence of aneurysm, dissection, vasculitis or significant stenosis. Minimal atherosclerosis near its origin without significant stenosis. Renals: Both renal arteries are patent without evidence of aneurysm, dissection, vasculitis, fibromuscular dysplasia or significant stenosis. IMA: Patent without evidence of aneurysm, dissection, vasculitis or significant stenosis. Inflow: Patent without evidence of aneurysm, dissection, vasculitis or significant stenosis. Proximal Outflow: The left external iliac artery demonstrates focal soft tissue plaque, series 5 image 126 narrowing the lumen by 70-80%. There is atherosclerosis along both common iliac arteries and bifurcations with ectasia of the left common iliac artery up to 1.8 cm. Both common femoral arteries appear patent. Veins: No obvious venous abnormality within the limitations  of this arterial phase study. Review of the MIP images confirms the above findings. NON-VASCULAR Lower chest: Cardiac chambers are normal in size. Small hiatal hernia is seen. Hepatobiliary: No space-occupying mass of the liver. No biliary dilatation. Normal appearing gallbladder. Pancreas: Normal Spleen: Normal Adrenals/Urinary Tract: Normal Stomach/Bowel: Scattered colonic diverticulosis. No bowel obstruction or acute diverticulitis. Normal appendix. Lymphatic: No lymphadenopathy. Reproductive: Top-normal size prostate without focal mass. Other: No ascites.  No abdominal wall hernia. Musculoskeletal: No acute osseous appearing abnormalities. IMPRESSION: VASCULAR Fusiform 4.3 cm in caliber infrarenal abdominal aortic aneurysm with plaque noted along the posterior and right lateral wall narrowing the lumen by approximately 50%. No evidence of rupture or dissection. 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 NON-VASCULAR Nonacute. Electronically Signed   By: Ashley Royalty M.D.   On: 07/03/2016 01:49   Cardiac Studies     Patient Profile     70 y.o. DM PTSD stents in 2006 Dr Irish Lack admited with typical sounding angina For cath in am  Assessment & Plan    Chest Pain: typical sounding angina r/o no acute ECG changes In am would see if Genene Churn can pull up old cath films Conitnue ASA/Plavix and nitrates No beta Blocker as he is bradycardic. Orders done on board in cath lab  Hematuria: resolved CT no stone consider outpatient urology f/u Likely ok to  Proceed with intervention / heparin if needed  Signed, Jenkins Rouge, MD  07/04/2016, 8:41 AM

## 2016-07-05 ENCOUNTER — Encounter (HOSPITAL_COMMUNITY)
Admission: EM | Disposition: A | Discharge status: Home / Self Care | Payer: Self-pay | Source: Home / Self Care | Attending: Internal Medicine

## 2016-07-05 ENCOUNTER — Encounter (HOSPITAL_COMMUNITY): Payer: Self-pay | Admitting: Cardiovascular Disease

## 2016-07-05 DIAGNOSIS — R079 Chest pain, unspecified: Secondary | ICD-10-CM

## 2016-07-05 HISTORY — PX: CARDIAC CATHETERIZATION: SHX172

## 2016-07-05 LAB — BASIC METABOLIC PANEL
ANION GAP: 2 — AB (ref 5–15)
BUN: 6 mg/dL (ref 6–20)
CALCIUM: 8.8 mg/dL — AB (ref 8.9–10.3)
CHLORIDE: 113 mmol/L — AB (ref 101–111)
CO2: 27 mmol/L (ref 22–32)
CREATININE: 1.02 mg/dL (ref 0.61–1.24)
GFR calc non Af Amer: >60 mL/min (ref >60)
GLUCOSE: 151 mg/dL — AB (ref 65–99)
Potassium: 3.9 mmol/L (ref 3.5–5.1)
Sodium: 142 mmol/L (ref 135–145)

## 2016-07-05 LAB — GLUCOSE, CAPILLARY
GLUCOSE-CAPILLARY: 131 mg/dL — AB (ref 65–99)
GLUCOSE-CAPILLARY: 115 mg/dL — AB (ref 65–99)
Glucose-Capillary: 159 mg/dL — ABNORMAL HIGH (ref 65–99)

## 2016-07-05 LAB — PROTIME-INR
INR: 0.98
PROTHROMBIN TIME: 12.9 s (ref 11.4–15.2)

## 2016-07-05 SURGERY — LEFT HEART CATH AND CORONARY ANGIOGRAPHY
Anesthesia: LOCAL

## 2016-07-05 MED ORDER — HEPARIN (PORCINE) IN NACL 2-0.9 UNIT/ML-% IJ SOLN
INTRAMUSCULAR | Status: DC | PRN
  Administered 2016-07-05: 1000 mL

## 2016-07-05 MED ORDER — FENTANYL CITRATE (PF) 100 MCG/2ML IJ SOLN
INTRAMUSCULAR | Status: DC | PRN
  Administered 2016-07-05: 25 ug via INTRAVENOUS

## 2016-07-05 MED ORDER — HEPARIN SODIUM (PORCINE) 1000 UNIT/ML IJ SOLN
INTRAMUSCULAR | Status: DC | PRN
  Administered 2016-07-05: 5000 [IU] via INTRAVENOUS

## 2016-07-05 MED ORDER — SODIUM CHLORIDE 0.9% FLUSH: 3.0000 mL | INTRAVENOUS | Status: DC | PRN

## 2016-07-05 MED ORDER — IOPAMIDOL (ISOVUE-370) INJECTION 76%
INTRAVENOUS | Status: DC | PRN
  Administered 2016-07-05: 75 mL via INTRA_ARTERIAL

## 2016-07-05 MED ORDER — VERAPAMIL HCL 2.5 MG/ML IV SOLN
INTRAVENOUS | Status: AC
  Filled 2016-07-05: qty 2

## 2016-07-05 MED ORDER — FENTANYL CITRATE (PF) 100 MCG/2ML IJ SOLN
INTRAMUSCULAR | Status: AC
  Filled 2016-07-05: qty 2

## 2016-07-05 MED ORDER — LIDOCAINE HCL (PF) 1 % IJ SOLN
INTRAMUSCULAR | Status: AC
  Filled 2016-07-05: qty 30

## 2016-07-05 MED ORDER — HEPARIN (PORCINE) IN NACL 2-0.9 UNIT/ML-% IJ SOLN
INTRAMUSCULAR | Status: DC | PRN
  Administered 2016-07-05: 10 mL via INTRA_ARTERIAL

## 2016-07-05 MED ORDER — SODIUM CHLORIDE 0.9 % WEIGHT BASED INFUSION
3.0000 mL/kg/h | INTRAVENOUS | Status: AC
  Administered 2016-07-05: 3 mL/kg/h via INTRAVENOUS

## 2016-07-05 MED ORDER — HEPARIN (PORCINE) IN NACL 2-0.9 UNIT/ML-% IJ SOLN
INTRAMUSCULAR | Status: AC
Start: 2016-07-05 — End: 2016-07-05
  Filled 2016-07-05: qty 1000

## 2016-07-05 MED ORDER — HEPARIN SODIUM (PORCINE) 1000 UNIT/ML IJ SOLN
INTRAMUSCULAR | Status: AC
  Filled 2016-07-05: qty 1

## 2016-07-05 MED ORDER — SODIUM CHLORIDE 0.9 % IV SOLN: 250.0000 mL | INTRAVENOUS | Status: DC | PRN

## 2016-07-05 MED ORDER — MIDAZOLAM HCL 2 MG/2ML IJ SOLN
INTRAMUSCULAR | Status: DC | PRN
  Administered 2016-07-05: 2 mg via INTRAVENOUS

## 2016-07-05 MED ORDER — IOPAMIDOL (ISOVUE-370) INJECTION 76%
INTRAVENOUS | Status: AC
  Filled 2016-07-05: qty 100

## 2016-07-05 MED ORDER — SODIUM CHLORIDE 0.9% FLUSH: 3.0000 mL | Freq: Two times a day (BID) | INTRAVENOUS | Status: DC

## 2016-07-05 MED ORDER — LIDOCAINE HCL (PF) 1 % IJ SOLN
INTRAMUSCULAR | Status: DC | PRN
  Administered 2016-07-05: 2 mL via SUBCUTANEOUS

## 2016-07-05 MED ORDER — MIDAZOLAM HCL 2 MG/2ML IJ SOLN
INTRAMUSCULAR | Status: AC
  Filled 2016-07-05: qty 2

## 2016-07-05 SURGICAL SUPPLY — 10 items

## 2016-07-05 NOTE — Progress Notes (Signed)
PROGRESS NOTE  [name]  JFH:[id number] DOB: [birth year] DOA: 07/02/2016 PCP: No PCP Per Patient Outpatient Specialists:  Subjective: Denies any chest pain this morning, In the afternoon. Patient also wants to eat his breakfast before he goes to the Cath Lab, "they messed up my breakfast for the past 2 days"  Brief Narrative:  [name] is a 70 y.o. male with a past medical history significant for CAD s/p PCI years ago, HTN, NIDDM, and smoking who presents with chest pain. The patient was in his usual state of health until about the last 48 hours when he developed a new left-sided chest discomfort, radiating to the left shoulder neck and arm. This was worsened with exertion, relieved with rest, and associated with nausea. Today, he had an episode of gross hematuria and so he came to the ER for the comminution of these 2 things.  Assessment & Plan:   Principal Problem:   Chest pain with high risk for cardiac etiology Active Problems:   Hypokalemia   Type 2 diabetes mellitus with other circulatory complications (CODE) (HCC)   Essential hypertension   Gross hematuria   Chest pain: -Typical chest pain, substernal, pressure-like and resolves with rest. -His pain comes with exertion and resolve with rest, classic stable angina symptoms -The patient has HEART score of 6. -Cardiac cath later today.  Hematuria:  -Ddx includes infection, nephrolith, malignancy. Given voiding symptoms, favor UTI. -Cephalexin 500 BID for 7 days -CT urogram done and showed no evidence of nephroliths or obstruction. -Hematuria resolved, continue aspirin and Plavix.  HTN and CAD secondary prevention:  -Continue Imdur and doxazosin -Continue Plavix and baby aspirin  NIDDM:  -Hold metformin -SSI while in hospital -Continue gabapentin   DVT prophylaxis: Lovenox Code Status: Full Code Family Communication: Plan discussed with the patient in the presence of his wife at bedside Disposition  Plan:  Diet: Diet NPO time specified  Consultants:   Cardiology  Procedures:   None  Antimicrobials:   None   Objective: Vitals:   07/04/16 1626 07/04/16 2050 07/05/16 0606 07/05/16 0951  BP:  133/81 (!) 148/85 (!) 139/97  Pulse:  (!) 49 (!) 42 (!) 57  Resp:  '19 19 14  '$ Temp: 98.1 F (36.7 C) 97.6 F (36.4 C) 97.8 F (36.6 C)   TempSrc: Oral Oral Oral   SpO2:  99% 98% 97%  Weight:   102.6 kg (226 lb 3.2 oz)   Height:        Intake/Output Summary (Last 24 hours) at 07/05/16 1122 Last data filed at 07/05/16 0930  Gross per 24 hour  Intake          2710.77 ml  Output             2750 ml  Net           -39.23 ml   Filed Weights   07/04/16 0616 07/04/16 1151 07/05/16 0606  Weight: 102.2 kg (225 lb 4.8 oz) 102.2 kg (225 lb 5 oz) 102.6 kg (226 lb 3.2 oz)    Examination: General exam: Appears calm and comfortable  Respiratory system: Clear to auscultation. Respiratory effort normal. Cardiovascular system: S1 & S2 heard, RRR. No JVD, murmurs, rubs, gallops or clicks. No pedal edema. Gastrointestinal system: Abdomen is nondistended, soft and nontender. No organomegaly or masses felt. Normal bowel sounds heard. Central nervous system: Alert and oriented. No focal neurological deficits. Extremities: Symmetric 5 x 5 power. Skin: No rashes, lesions or ulcers Psychiatry: Judgement and insight appear normal.  Mood & affect appropriate.   Data Reviewed: I have personally reviewed following labs and imaging studies  CBC:  Recent Labs Lab 07/02/16 2111 07/04/16 0349  WBC 7.1 5.1  HGB 15.0 13.1  HCT 44.8 40.6  MCV 90.3 90.4  PLT 167 767   Basic Metabolic Panel:  Recent Labs Lab 07/02/16 2111 07/03/16 0448 07/04/16 0349 07/05/16 0450  NA 140  --  141 142  K 3.4*  --  3.6 3.9  CL 108  --  111 113*  CO2 22  --  24 27  GLUCOSE 157*  --  127* 151*  BUN 9  --  9 6  CREATININE 1.11  --  1.01 1.02  CALCIUM 9.2  --  8.7* 8.8*  MG  --  1.9  --   --     GFR: Estimated Creatinine Clearance: 87.4 mL/min (by C-G formula based on SCr of 1.02 mg/dL). Liver Function Tests: No results for input(s): AST, ALT, ALKPHOS, BILITOT, PROT, ALBUMIN in the last 168 hours. No results for input(s): LIPASE, AMYLASE in the last 168 hours. No results for input(s): AMMONIA in the last 168 hours. Coagulation Profile:  Recent Labs Lab 07/05/16 0450  INR 0.98   Cardiac Enzymes:  Recent Labs Lab 07/02/16 2111 07/03/16 0448 07/03/16 0710  TROPONINI <0.03 <0.03 <0.03   BNP (last 3 results) No results for input(s): PROBNP in the last 8760 hours. HbA1C: No results for input(s): HGBA1C in the last 72 hours. CBG:  Recent Labs Lab 07/04/16 0013 07/04/16 0731 07/04/16 1621 07/04/16 2334 07/05/16 0735  GLUCAP 114* 214* 151* 133* 131*   Lipid Profile: No results for input(s): CHOL, HDL, LDLCALC, TRIG, CHOLHDL, LDLDIRECT in the last 72 hours. Thyroid Function Tests: No results for input(s): TSH, T4TOTAL, FREET4, T3FREE, THYROIDAB in the last 72 hours. Anemia Panel: No results for input(s): VITAMINB12, FOLATE, FERRITIN, TIBC, IRON, RETICCTPCT in the last 72 hours. Urine analysis:    Component Value Date/Time   COLORURINE YELLOW 07/02/2016 2112   APPEARANCEUR CLEAR 07/02/2016 2112   LABSPEC 1.006 07/02/2016 2112   PHURINE 5.5 07/02/2016 2112   GLUCOSEU NEGATIVE 07/02/2016 2112   HGBUR LARGE (A) 07/02/2016 2112   BILIRUBINUR NEGATIVE 07/02/2016 2112   KETONESUR NEGATIVE 07/02/2016 2112   PROTEINUR NEGATIVE 07/02/2016 2112   NITRITE NEGATIVE 07/02/2016 2112   LEUKOCYTESUR TRACE (A) 07/02/2016 2112   Sepsis Labs: '@LABRCNTIP'$ (procalcitonin:4,lacticidven:4)  ) Recent Results (from the past 240 hour(s))  Urine culture     Status: None   Collection Time: 07/02/16  9:12 PM  Result Value Ref Range Status   Specimen Description URINE, RANDOM  Final   Special Requests NONE  Final   Culture NO GROWTH  Final   Report Status 07/04/2016 FINAL   Final  MRSA PCR Screening     Status: None   Collection Time: 07/03/16  4:15 AM  Result Value Ref Range Status   MRSA by PCR NEGATIVE NEGATIVE Final    Comment:        The GeneXpert MRSA Assay (FDA approved for NASAL specimens only), is one component of a comprehensive MRSA colonization surveillance program. It is not intended to diagnose MRSA infection nor to guide or monitor treatment for MRSA infections.      Invalid input(s): PROCALCITONIN, LACTICACIDVEN   Radiology Studies: Ct Renal Stone Study  Result Date: 07/04/2016 CLINICAL DATA:  Chest and shoulder pain. History of renal stones. Hematuria. EXAM: CT ABDOMEN AND PELVIS WITHOUT CONTRAST TECHNIQUE: Multidetector CT imaging of the abdomen  and pelvis was performed following the standard protocol without IV contrast. COMPARISON:  07/03/2016 CT FINDINGS: Lower chest: Normal visualized cardiac chamber size. No pericardial effusion. There is coronary arteriosclerosis. Small hiatal hernia is present. Left lower lobe pulmonary scarring. Atelectasis in the right lower lobe. Hepatobiliary: The unenhanced liver, gallbladder and biliary tree are unremarkable. No space-occupying mass is noted. Pancreas: Normal Spleen: Normal Adrenals/Urinary Tract: The adrenal glands are unremarkable. No nephrolithiasis nor obstructive uropathy. No hydroureteronephrosis. The urinary bladder is free of stones. Stomach/Bowel: There is normal bowel rotation. No acute inflammation or bowel obstruction. Vascular/Lymphatic: There is aortic atherosclerosis. Fusiform slightly tortuous infrarenal abdominal aortic aneurysm is unchanged measuring 4.3 cm transverse. No leak. No adenopathy. Reproductive: Top-normal size prostate Other: No ascites or abdominal wall hernia. Musculoskeletal: No acute osseous abnormality. IMPRESSION: No nephrolithiasis nor obstructive uropathy. No acute bowel inflammation. 4.3 cm transverse infrarenal abdominal aortic aneurysm. Electronically  Signed   By: Ashley Royalty M.D.   On: 07/04/2016 00:23        Scheduled Meds: . aspirin  81 mg Oral Daily  . cephALEXin  500 mg Oral Q12H  . clopidogrel  75 mg Oral Daily  . doxazosin  1 mg Oral Daily  . enoxaparin (LOVENOX) injection  40 mg Subcutaneous Q24H  . gabapentin  300 mg Oral BID  . Influenza vac split quadrivalent PF  0.5 mL Intramuscular Tomorrow-1000  . insulin aspart  0-9 Units Subcutaneous Q8H  . isosorbide mononitrate  30 mg Oral Daily  . sodium chloride flush  3 mL Intravenous Q12H   Continuous Infusions: . sodium chloride Stopped (07/05/16 0553)  . sodium chloride 1 mL/kg/hr (07/05/16 0700)     LOS: 2 days    Time spent: 35 minutes    Shella Lahman A, MD [facility] Pager [phone]  If 7PM-7AM, please contact night-coverage [email or web address] Password [email or web address] 07/05/2016, 11:22 AM

## 2016-07-05 NOTE — H&P (View-Only) (Signed)
     SUBJECTIVE: No chest pain or dyspnea.   Tele: sinus brady, rate 45 bpm  BP (!) 148/85 (BP Location: Right Arm)   Pulse (!) 42   Temp 97.8 F (36.6 C) (Oral)   Resp 19   Ht '6\' 2"'$  (1.88 m)   Wt 226 lb 3.2 oz (102.6 kg)   SpO2 98%   BMI 29.04 kg/m   Intake/Output Summary (Last 24 hours) at 07/05/16 4193 Last data filed at 07/05/16 0600  Gross per 24 hour  Intake          2590.77 ml  Output             2550 ml  Net            40.77 ml    PHYSICAL EXAM General: Well developed, well nourished, in no acute distress. Alert and oriented x 3.  Psych:  Good affect, responds appropriately Neck: No JVD. No masses noted.  Lungs: Clear bilaterally with no wheezes or rhonci noted.  Heart: Loletha Grayer, regular rhythm with no murmurs noted. Abdomen: Bowel sounds are present. Soft, non-tender.  Extremities: No lower extremity edema.   LABS: Basic Metabolic Panel:  Recent Labs  07/03/16 0448 07/04/16 0349 07/05/16 0450  NA  --  141 142  K  --  3.6 3.9  CL  --  111 113*  CO2  --  24 27  GLUCOSE  --  127* 151*  BUN  --  9 6  CREATININE  --  1.01 1.02  CALCIUM  --  8.7* 8.8*  MG 1.9  --   --    CBC:  Recent Labs  07/02/16 2111 07/04/16 0349  WBC 7.1 5.1  HGB 15.0 13.1  HCT 44.8 40.6  MCV 90.3 90.4  PLT 167 150   Cardiac Enzymes:  Recent Labs  07/02/16 2111 07/03/16 0448 07/03/16 0710  TROPONINI <0.03 <0.03 <0.03    Current Meds: . aspirin  81 mg Oral Daily  . cephALEXin  500 mg Oral Q12H  . clopidogrel  75 mg Oral Daily  . doxazosin  1 mg Oral Daily  . enoxaparin (LOVENOX) injection  40 mg Subcutaneous Q24H  . gabapentin  300 mg Oral BID  . Influenza vac split quadrivalent PF  0.5 mL Intramuscular Tomorrow-1000  . insulin aspart  0-9 Units Subcutaneous Q8H  . isosorbide mononitrate  30 mg Oral Daily  . sodium chloride flush  3 mL Intravenous Q12H     ASSESSMENT AND PLAN: 70 yo male with history of DM, HTN, tobacco abuse and CAD admitted with unstable  angina.   1. CAD/unstable angina: Troponin negative. Classic symptoms in pt with known CAD and prior stenting in 2006. Cardiac cath with possible PCI later today. Cath this afternoon as pt demanded to have breakfast this am.   2. Hematuria: Resolved. CT scan with no evidence of stones. He has a history of kidney stones and had flank pain and dysuria last week before he had hematuria. This has all resolved. Urine is clear today.     Lauree Chandler  11/13/20178:23 AM

## 2016-07-05 NOTE — Care Management Note (Addendum)
Case Management Note  Patient Details  Name: [name] MRN: [id number] Date of Birth: [birth year]  Subjective/Objective:  Pt presented for Chest Pain. Plan for cardiac cath with possible PCI later today 07-05-16. Pt is from home with wife. Plan to return home once stable. Pt listed as having VA Benefits. Pt has Medicare as well. Pt did not have Medicare Card at the time of visit. Pt's wife iw aware to bring if pt has to stay post PCI. If pt to d/c post cath today plan will be to contact Billing with Insurance Coverage.                   Action/Plan: CM did call the [facility] and left voicemail with April Alexander to make her aware that pt is hospitalized. Pt's PCP is Dr. Ivor Costa @ the [facility]. CM will continue to monitor for additional needs.   Expected Discharge Date:                  Expected Discharge Plan:  Home/Self Care  In-House Referral:  NA  Discharge planning Services  CM Consult  Post Acute Care Choice:    Choice offered to:     DME Arranged:    DME Agency:     HH Arranged:    HH Agency:     Status of Service:  In process, will continue to follow  If discussed at Long Length of Stay Meetings, dates discussed:    Additional Comments: 1046 07-06-16 Jacqlyn Krauss, RN,BSN [phone]  Plan will be to d/c home. New Rx will be for Keflex. CM did call the [place] and for 10 days medication will be $9.00. Pt is aware. No further needs from CM at this time.  Bethena Roys, RN 07/05/2016, 12:19 PM

## 2016-07-05 NOTE — Progress Notes (Signed)
Pt back from cath lab resting in bed with 14cc of air in TR band. Removed 3cc of air and his right radial site started bleeding. 3 cc of air added back to band. Pt's site stopped bleeding. Np on call made aware. No new orders received. V/S stable. Will cont to monitor pt.

## 2016-07-05 NOTE — Progress Notes (Signed)
     SUBJECTIVE: No chest pain or dyspnea.   Tele: sinus brady, rate 45 bpm  BP (!) 148/85 (BP Location: Right Arm)   Pulse (!) 42   Temp 97.8 F (36.6 C) (Oral)   Resp 19   Ht '6\' 2"'$  (1.88 m)   Wt 226 lb 3.2 oz (102.6 kg)   SpO2 98%   BMI 29.04 kg/m   Intake/Output Summary (Last 24 hours) at 07/05/16 5284 Last data filed at 07/05/16 0600  Gross per 24 hour  Intake          2590.77 ml  Output             2550 ml  Net            40.77 ml    PHYSICAL EXAM General: Well developed, well nourished, in no acute distress. Alert and oriented x 3.  Psych:  Good affect, responds appropriately Neck: No JVD. No masses noted.  Lungs: Clear bilaterally with no wheezes or rhonci noted.  Heart: Loletha Grayer, regular rhythm with no murmurs noted. Abdomen: Bowel sounds are present. Soft, non-tender.  Extremities: No lower extremity edema.   LABS: Basic Metabolic Panel:  Recent Labs  07/03/16 0448 07/04/16 0349 07/05/16 0450  NA  --  141 142  K  --  3.6 3.9  CL  --  111 113*  CO2  --  24 27  GLUCOSE  --  127* 151*  BUN  --  9 6  CREATININE  --  1.01 1.02  CALCIUM  --  8.7* 8.8*  MG 1.9  --   --    CBC:  Recent Labs  07/02/16 2111 07/04/16 0349  WBC 7.1 5.1  HGB 15.0 13.1  HCT 44.8 40.6  MCV 90.3 90.4  PLT 167 150   Cardiac Enzymes:  Recent Labs  07/02/16 2111 07/03/16 0448 07/03/16 0710  TROPONINI <0.03 <0.03 <0.03    Current Meds: . aspirin  81 mg Oral Daily  . cephALEXin  500 mg Oral Q12H  . clopidogrel  75 mg Oral Daily  . doxazosin  1 mg Oral Daily  . enoxaparin (LOVENOX) injection  40 mg Subcutaneous Q24H  . gabapentin  300 mg Oral BID  . Influenza vac split quadrivalent PF  0.5 mL Intramuscular Tomorrow-1000  . insulin aspart  0-9 Units Subcutaneous Q8H  . isosorbide mononitrate  30 mg Oral Daily  . sodium chloride flush  3 mL Intravenous Q12H     ASSESSMENT AND PLAN: 70 yo male with history of DM, HTN, tobacco abuse and CAD admitted with unstable  angina.   1. CAD/unstable angina: Troponin negative. Classic symptoms in pt with known CAD and prior stenting in 2006. Cardiac cath with possible PCI later today. Cath this afternoon as pt demanded to have breakfast this am.   2. Hematuria: Resolved. CT scan with no evidence of stones. He has a history of kidney stones and had flank pain and dysuria last week before he had hematuria. This has all resolved. Urine is clear today.     [name]  11/13/20178:23 AM

## 2016-07-05 NOTE — Interval H&P Note (Signed)
Cath Lab Visit (complete for each Cath Lab visit)  Clinical Evaluation Leading to the Procedure:   ACS: Yes.    Non-ACS:    Anginal Classification: CCS IV  Anti-ischemic medical therapy: Minimal Therapy (1 class of medications)  Non-Invasive Test Results: No non-invasive testing performed  Prior CABG: No previous CABG      History and Physical Interval Note:  07/05/2016 1:41 PM  [name]  has presented today for surgery, with the diagnosis of unstable angina  The various methods of treatment have been discussed with the patient and family. After consideration of risks, benefits and other options for treatment, the patient has consented to  Procedure(s): Left Heart Cath and Coronary Angiography (N/A) as a surgical intervention .  The patient's history has been reviewed, patient examined, no change in status, stable for surgery.  I have reviewed the patient's chart and labs.  Questions were answered to the patient's satisfaction.     Sherren Mocha

## 2016-07-06 DIAGNOSIS — I2510 Atherosclerotic heart disease of native coronary artery without angina pectoris: Secondary | ICD-10-CM

## 2016-07-06 DIAGNOSIS — R001 Bradycardia, unspecified: Secondary | ICD-10-CM

## 2016-07-06 LAB — GLUCOSE, CAPILLARY
GLUCOSE-CAPILLARY: 204 mg/dL — AB (ref 65–99)
GLUCOSE-CAPILLARY: 124 mg/dL — AB (ref 65–99)

## 2016-07-06 LAB — TSH: TSH: 1.786 u[IU]/mL (ref 0.350–4.500)

## 2016-07-06 MED ORDER — CEPHALEXIN 500 MG PO CAPS: 500.0000 mg | ORAL_CAPSULE | Freq: Two times a day (BID) | ORAL | 0 refills | Status: DC

## 2016-07-06 MED FILL — CEPHALEXIN 500 MG CAPSULE: 500 | 5 days supply | Qty: 10 | Fill #0

## 2016-07-06 NOTE — Progress Notes (Signed)
     SUBJECTIVE: No chest pain or dyspnea.   Tele: sinus brady  BP 133/80 (BP Location: Left Arm)   Pulse (!) 43   Temp 97.9 F (36.6 C) (Oral)   Resp 18   Ht '6\' 2"'$  (1.88 m)   Wt 224 lb 8 oz (101.8 kg)   SpO2 97%   BMI 28.82 kg/m   Intake/Output Summary (Last 24 hours) at 07/06/16 7408 Last data filed at 07/06/16 1448  Gross per 24 hour  Intake              960 ml  Output             2700 ml  Net            -1740 ml    PHYSICAL EXAM General: Well developed, well nourished, in no acute distress. Alert and oriented x 3.  Psych:  Good affect, responds appropriately Neck: No JVD. No masses noted.  Lungs: Clear bilaterally with no wheezes or rhonci noted.  Heart: Regular, brady with no murmurs noted. Abdomen: Bowel sounds are present. Soft, non-tender.  Extremities: No lower extremity edema.   LABS: Basic Metabolic Panel:  Recent Labs  07/04/16 0349 07/05/16 0450  NA 141 142  K 3.6 3.9  CL 111 113*  CO2 24 27  GLUCOSE 127* 151*  BUN 9 6  CREATININE 1.01 1.02  CALCIUM 8.7* 8.8*   CBC:  Recent Labs  07/04/16 0349  WBC 5.1  HGB 13.1  HCT 40.6  MCV 90.4  PLT 150   Current Meds: . aspirin  81 mg Oral Daily  . cephALEXin  500 mg Oral Q12H  . clopidogrel  75 mg Oral Daily  . doxazosin  1 mg Oral Daily  . enoxaparin (LOVENOX) injection  40 mg Subcutaneous Q24H  . gabapentin  300 mg Oral BID  . Influenza vac split quadrivalent PF  0.5 mL Intramuscular Tomorrow-1000  . insulin aspart  0-9 Units Subcutaneous Q8H  . isosorbide mononitrate  30 mg Oral Daily  . sodium chloride flush  3 mL Intravenous Q12H    ASSESSMENT AND PLAN: 70 yo male with history of DM, HTN, tobacco abuse and CAD admitted with chest pain.   1. CAD: Cardiac cath 07/05/16 with stable CAD. Patent RCA stent with mild restenosis. Mild plaque in the LAD and Circumflex. Plan to continue medical management of CAD. Will continue ASA, Plavix. He should not use Viagra if using Imdur.  No beta  blocker given bradycardia. He has not tolerated statins.  OK to d/c from cardiac perspective. I will arrange f/u in our office with Dr. Irish Lack.   2. Hematuria: Resolved. CT scan with no evidence of stones. He has a history of kidney stones and had flank pain and dysuria last week before he had hematuria. This has all resolved. Urine is clear today.      Lauree Chandler  11/14/20178:33 AM

## 2016-07-06 NOTE — Discharge Summary (Signed)
Physician Discharge Summary  [name] GQQ:[id number] DOB: 1946/04/13 DOA: 07/02/2016  PCP: No PCP Per Patient  Admit date: 07/02/2016 Discharge date: 07/06/2016  Admitted From: Home Disposition: Home  Recommendations for Outpatient Follow-up:  1. Follow up with PCP in 1-2 weeks 2. Please obtain BMP/CBC in one week 3. Please follow up on the following pending results:  Home Health: NA Equipment/Devices:NA  Discharge Condition: Stable CODE STATUS: Full Code Diet recommendation: Diet Heart Room service appropriate? Yes; Fluid consistency: Thin Diet - low sodium heart healthy  Brief/Interim Summary: Grant Stubbsis a 70 y.o.malewith a past medical history significant for CAD s/p PCI years ago, HTN, NIDDM, and smokingwho presents with chest pain. The patient was in his usual state of health until about the last 48 hours when he developed a new left-sided chest discomfort, radiating to the left shoulder neck and arm. This was worsened with exertion, relieved with rest, and associated with nausea. Today, he had an episode of gross hematuria and so he came to the ER for the comminution of these 2 things.  Discharge Diagnoses:  Principal Problem:   Chest pain with high risk for cardiac etiology Active Problems:   Hypokalemia   Type 2 diabetes mellitus with other circulatory complications (CODE) (HCC)   Essential hypertension   Gross hematuria   Bradycardia   Chest pain: -Typical chest pain, substernal, pressure-like and resolves with rest. -His pain comes with exertion and resolve with rest, classic stable angina symptoms -The patient has HEART score of 6. -Cardiac cath done 07/05/16 and showed non-obstructive coronaries.  -Patient counseled, not use Viagra while he is on Imdur.  Bradycardia, sinus -Heart rate is in the 50s to 60s when he is awake, went down to 38 at night. -After walking his heart rate was around 60, I wonder contributing to his SOB and chest  pain -He is not on any AV nodal blocking medications, follow-up with primary cardiologist.  Hematuria: -Ddx includes infection, nephrolith, malignancy.Given voiding symptoms, favor UTI. -Cephalexin 500 BID for 7 days -CT urogram done and showed no evidence of nephroliths or obstruction. -Hematuria resolved, continue aspirin and Plavix.  HTN and CAD secondary prevention: -Continue Imdur and doxazosin -Continue Plavix and baby aspirin  NIDDM: -Hold metformin -SSI while in hospital -Continue gabapentin   Discharge Instructions  Discharge Instructions    Diet - low sodium heart healthy    Complete by:  As directed    Increase activity slowly    Complete by:  As directed        Medication List    TAKE these medications   albuterol 108 (90 Base) MCG/ACT inhaler Commonly known as:  PROVENTIL HFA;VENTOLIN HFA Inhale into the lungs every 6 (six) hours as needed for wheezing or shortness of breath. Inhale 2 puffs every 6 hours as needed for breathing   aspirin 81 MG chewable tablet Chew 81 mg by mouth daily.   buPROPion 150 MG 12 hr tablet Commonly known as:  ZYBAN Take 150 mg by mouth daily. 12hr   cephALEXin 500 MG capsule Commonly known as:  KEFLEX Take 1 capsule (500 mg total) by mouth every 12 (twelve) hours.   clopidogrel 75 MG tablet Commonly known as:  PLAVIX Take 75 mg by mouth daily.   doxazosin 1 MG tablet Commonly known as:  CARDURA Take 1 mg by mouth daily.   gabapentin 300 MG capsule Commonly known as:  NEURONTIN Take 300 mg by mouth 2 (two) times daily.   hydroxypropyl methylcellulose / hypromellose 2.5 %  ophthalmic solution Commonly known as:  ISOPTO TEARS / GONIOVISC Place 1-2 drops into both eyes 3 (three) times daily as needed for dry eyes.   isosorbide mononitrate 30 MG 24 hr tablet Commonly known as:  IMDUR Take 30 mg by mouth daily.   loratadine 10 MG tablet Commonly known as:  CLARITIN Take 10 mg by mouth daily.   meclizine  25 MG tablet Commonly known as:  ANTIVERT Take 25 mg by mouth 3 (three) times daily as needed for dizziness or nausea.   metFORMIN 500 MG tablet Commonly known as:  GLUCOPHAGE Take 1,000 mg by mouth daily with breakfast.   nitroGLYCERIN 0.4 MG SL tablet Commonly known as:  NITROSTAT Place 0.4 mg under the tongue every 5 (five) minutes as needed for chest pain.   sildenafil 100 MG tablet Commonly known as:  VIAGRA Take 100 mg by mouth daily as needed for erectile dysfunction.       Allergies  Allergen Reactions  . Erythromycin Shortness Of Breath and Swelling  . Simvastatin     CAUSE MUSCLE PAIN,.    Consultations: Treatment Team:  Rounding Lbcardiology, MD   Procedures (Echo, Carotid, EGD, Colonoscopy, ERCP)   Left Heart Cath and Coronary Angiography 07/05/2016 done by [facility]  Conclusion   1. Mild nonobstructive coronary artery disease affecting the left main, LAD, and left circumflex 2. Continued patency of the stented segment in the mid right coronary artery with mild to moderate diffuse narrowing in a relatively small RCA. 3. Preserved LV systolic function with normal LVEDP  Recommendations: Would continue with medical therapy     Radiological studies: Dg Chest 2 View  Result Date: 07/02/2016 CLINICAL DATA:  Left-sided chest pain, dyspnea and cough EXAM: CHEST  2 VIEW COMPARISON:  Studies dating back through 11/04/2007 FINDINGS: Lungs are slightly hyperinflated without pneumonic consolidation. Heart is top-normal in size. The thoracic aorta is not aneurysmal. Pulmonary vasculature is normal. There is no effusion. Tiny 3 mm density overlying the right anterior second rib is unchanged relative to 2009. Findings are consistent with a benign finding. IMPRESSION: Mild pulmonary hyperinflation without pneumonic consolidation, pneumothorax or CHF. Electronically Signed   By: Ashley Royalty M.D.   On: 07/02/2016 21:42   Ct Renal Stone Study  Result Date:  07/04/2016 CLINICAL DATA:  Chest and shoulder pain. History of renal stones. Hematuria. EXAM: CT ABDOMEN AND PELVIS WITHOUT CONTRAST TECHNIQUE: Multidetector CT imaging of the abdomen and pelvis was performed following the standard protocol without IV contrast. COMPARISON:  07/03/2016 CT FINDINGS: Lower chest: Normal visualized cardiac chamber size. No pericardial effusion. There is coronary arteriosclerosis. Small hiatal hernia is present. Left lower lobe pulmonary scarring. Atelectasis in the right lower lobe. Hepatobiliary: The unenhanced liver, gallbladder and biliary tree are unremarkable. No space-occupying mass is noted. Pancreas: Normal Spleen: Normal Adrenals/Urinary Tract: The adrenal glands are unremarkable. No nephrolithiasis nor obstructive uropathy. No hydroureteronephrosis. The urinary bladder is free of stones. Stomach/Bowel: There is normal bowel rotation. No acute inflammation or bowel obstruction. Vascular/Lymphatic: There is aortic atherosclerosis. Fusiform slightly tortuous infrarenal abdominal aortic aneurysm is unchanged measuring 4.3 cm transverse. No leak. No adenopathy. Reproductive: Top-normal size prostate Other: No ascites or abdominal wall hernia. Musculoskeletal: No acute osseous abnormality. IMPRESSION: No nephrolithiasis nor obstructive uropathy. No acute bowel inflammation. 4.3 cm transverse infrarenal abdominal aortic aneurysm. Electronically Signed   By: Ashley Royalty M.D.   On: 07/04/2016 00:23   Ct Angio Abd/pel W And/or Wo Contrast  Result Date: 07/03/2016 CLINICAL DATA:  Patient with known aortic aneurysm complaining of mid abdominal pain radiating to both flanks and upper chest. Diabetic. EXAM: CTA ABDOMEN AND PELVIS WITHOUT AND WITH CONTRAST TECHNIQUE: Multidetector CT imaging of the abdomen and pelvis was performed using the standard protocol during bolus administration of intravenous contrast. Multiplanar reconstructed images and MIPs were obtained and reviewed to  evaluate the vascular anatomy. CONTRAST:  100 cc of Isovue 370 IV COMPARISON:  MRI of the lumbar spine from 04/27/2001 report which indicated an abdominal aortic aneurysm at L3 and L4 approximately 2.8 cm transverse. FINDINGS: VASCULAR Aorta: There is an infrarenal fusiform 4.3 cm abdominal aortic aneurysm starting approximately 4.5 cm from the right renal artery. There is soft intraluminal plaque along the posterior right lateral wall of the aneurysm narrowing the lumen up to 60%. The aneurysm terminates at the aortic bifurcation. There is no evidence of dissection or rupture. Celiac: Patent without evidence of aneurysm, dissection, vasculitis or significant stenosis. There is some minimal irregular soft and calcific plaque near its origin. SMA: Patent without evidence of aneurysm, dissection, vasculitis or significant stenosis. Minimal atherosclerosis near its origin without significant stenosis. Renals: Both renal arteries are patent without evidence of aneurysm, dissection, vasculitis, fibromuscular dysplasia or significant stenosis. IMA: Patent without evidence of aneurysm, dissection, vasculitis or significant stenosis. Inflow: Patent without evidence of aneurysm, dissection, vasculitis or significant stenosis. Proximal Outflow: The left external iliac artery demonstrates focal soft tissue plaque, series 5 image 126 narrowing the lumen by 70-80%. There is atherosclerosis along both common iliac arteries and bifurcations with ectasia of the left common iliac artery up to 1.8 cm. Both common femoral arteries appear patent. Veins: No obvious venous abnormality within the limitations of this arterial phase study. Review of the MIP images confirms the above findings. NON-VASCULAR Lower chest: Cardiac chambers are normal in size. Small hiatal hernia is seen. Hepatobiliary: No space-occupying mass of the liver. No biliary dilatation. Normal appearing gallbladder. Pancreas: Normal Spleen: Normal Adrenals/Urinary  Tract: Normal Stomach/Bowel: Scattered colonic diverticulosis. No bowel obstruction or acute diverticulitis. Normal appendix. Lymphatic: No lymphadenopathy. Reproductive: Top-normal size prostate without focal mass. Other: No ascites.  No abdominal wall hernia. Musculoskeletal: No acute osseous appearing abnormalities. IMPRESSION: VASCULAR Fusiform 4.3 cm in caliber infrarenal abdominal aortic aneurysm with plaque noted along the posterior and right lateral wall narrowing the lumen by approximately 50%. No evidence of rupture or dissection. 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 NON-VASCULAR Nonacute. Electronically Signed   By: Ashley Royalty M.D.   On: 07/03/2016 01:49    Subjective:  Discharge Exam: Vitals:   07/05/16 2115 07/05/16 2133 07/05/16 2329 07/06/16 0500  BP: (!) 160/93 (!) 163/90 133/85 133/80  Pulse: (!) 45 (!) 43  (!) 43  Resp: '18 17  18  '$ Temp: 97.5 F (36.4 C)   97.9 F (36.6 C)  TempSrc: Oral   Oral  SpO2: 99% 100%  97%  Weight:    101.8 kg (224 lb 8 oz)  Height:       General: Pt is alert, awake, not in acute distress Cardiovascular: RRR, S1/S2 +, no rubs, no gallops Respiratory: CTA bilaterally, no wheezing, no rhonchi Abdominal: Soft, NT, ND, bowel sounds + Extremities: no edema, no cyanosis   The results of significant diagnostics from this hospitalization (including imaging, microbiology, ancillary and laboratory) are listed below for reference.    Microbiology: Recent Results (from the past 240 hour(s))  Urine culture     Status:  None   Collection Time: 07/02/16  9:12 PM  Result Value Ref Range Status   Specimen Description URINE, RANDOM  Final   Special Requests NONE  Final   Culture NO GROWTH  Final   Report Status 07/04/2016 FINAL  Final  MRSA PCR Screening     Status: None   Collection Time: 07/03/16  4:15 AM  Result Value Ref Range Status   MRSA by PCR NEGATIVE NEGATIVE Final    Comment:         The GeneXpert MRSA Assay (FDA approved for NASAL specimens only), is one component of a comprehensive MRSA colonization surveillance program. It is not intended to diagnose MRSA infection nor to guide or monitor treatment for MRSA infections.      Labs: BNP (last 3 results) No results for input(s): BNP in the last 8760 hours. Basic Metabolic Panel:  Recent Labs Lab 07/02/16 2111 07/03/16 0448 07/04/16 0349 07/05/16 0450  NA 140  --  141 142  K 3.4*  --  3.6 3.9  CL 108  --  111 113*  CO2 22  --  24 27  GLUCOSE 157*  --  127* 151*  BUN 9  --  9 6  CREATININE 1.11  --  1.01 1.02  CALCIUM 9.2  --  8.7* 8.8*  MG  --  1.9  --   --    Liver Function Tests: No results for input(s): AST, ALT, ALKPHOS, BILITOT, PROT, ALBUMIN in the last 168 hours. No results for input(s): LIPASE, AMYLASE in the last 168 hours. No results for input(s): AMMONIA in the last 168 hours. CBC:  Recent Labs Lab 07/02/16 2111 07/04/16 0349  WBC 7.1 5.1  HGB 15.0 13.1  HCT 44.8 40.6  MCV 90.3 90.4  PLT 167 150   Cardiac Enzymes:  Recent Labs Lab 07/02/16 2111 07/03/16 0448 07/03/16 0710  TROPONINI <0.03 <0.03 <0.03   BNP: Invalid input(s): POCBNP CBG:  Recent Labs Lab 07/05/16 0735 07/05/16 1504 07/05/16 1629 07/05/16 2327 07/06/16 0741  GLUCAP 131* 115* 159* 204* 124*   D-Dimer No results for input(s): DDIMER in the last 72 hours. Hgb A1c No results for input(s): HGBA1C in the last 72 hours. Lipid Profile No results for input(s): CHOL, HDL, LDLCALC, TRIG, CHOLHDL, LDLDIRECT in the last 72 hours. Thyroid function studies No results for input(s): TSH, T4TOTAL, T3FREE, THYROIDAB in the last 72 hours.  Invalid input(s): FREET3 Anemia work up No results for input(s): VITAMINB12, FOLATE, FERRITIN, TIBC, IRON, RETICCTPCT in the last 72 hours. Urinalysis    Component Value Date/Time   COLORURINE YELLOW 07/02/2016 2112   APPEARANCEUR CLEAR 07/02/2016 2112   LABSPEC 1.006  07/02/2016 2112   PHURINE 5.5 07/02/2016 2112   GLUCOSEU NEGATIVE 07/02/2016 2112   HGBUR LARGE (A) 07/02/2016 2112   BILIRUBINUR NEGATIVE 07/02/2016 2112   KETONESUR NEGATIVE 07/02/2016 2112   PROTEINUR NEGATIVE 07/02/2016 2112   NITRITE NEGATIVE 07/02/2016 2112   LEUKOCYTESUR TRACE (A) 07/02/2016 2112   Sepsis Labs Invalid input(s): PROCALCITONIN,  WBC,  LACTICIDVEN Microbiology Recent Results (from the past 240 hour(s))  Urine culture     Status: None   Collection Time: 07/02/16  9:12 PM  Result Value Ref Range Status   Specimen Description URINE, RANDOM  Final   Special Requests NONE  Final   Culture NO GROWTH  Final   Report Status 07/04/2016 FINAL  Final  MRSA PCR Screening     Status: None   Collection Time: 07/03/16  4:15 AM  Result Value Ref Range Status   MRSA by PCR NEGATIVE NEGATIVE Final    Comment:        The GeneXpert MRSA Assay (FDA approved for NASAL specimens only), is one component of a comprehensive MRSA colonization surveillance program. It is not intended to diagnose MRSA infection nor to guide or monitor treatment for MRSA infections.      Time coordinating discharge: Over 30 minutes  SIGNED:   Birdie Hopes, MD  [facility] 07/06/2016, 9:32 AM Pager   If 7PM-7AM, please contact night-coverage [email or web address] Password [email or web address]

## 2016-07-09 ENCOUNTER — Encounter: Payer: Self-pay | Admitting: Interventional Cardiology

## 2016-07-11 NOTE — Progress Notes (Signed)
Cardiology Office Note   Date:  07/12/2016   ID:  Worth Kober, DOB 31-Dec-1945, MRN [id number]  PCP:  No PCP Per Patient    Chief Complaint  Patient presents with  . Hospitalization Follow-up     Wt Readings from Last 3 Encounters:  07/12/16 105.1 kg (231 lb 12.8 oz)  07/06/16 101.8 kg (224 lb 8 oz)       History of Present Illness: [name] is a 70 y.o. male  with a past medical history significant for CAD s/p PCI to the RCA in 2006, HTN, NIDDM, and smokingwho presents with chest pain. In 11/17, he developed a new left-sided chest discomfort, radiating to the left shoulder neck and arm. This was worsened with exertion, relieved with rest, and associated with nausea.  He had an episode of gross hematuria and came to the hospital.  He had a cath showing a patent stent and no further obstructive CAD.   He was treated for UTI with ABx, which was thought to be the cause of the hematuria. Aspirin and Pavix were resumed after negative CT urogram.  No  Further chest pain since being discharged form the hospital.  He has sensations at time but does not think they are related to his heart.   Knee pain limits his walking.  Left ankle swelling since starting a BP medicine, but this resolved.     Past Medical History:  Diagnosis Date  . Asthma   . Coronary artery disease   . Diabetes mellitus without complication ([place])   . History of kidney stones   . Hypertension   . Peripheral vascular disease [facility])     Past Surgical History:  Procedure Laterality Date  . CARDIAC CATHETERIZATION N/A 07/05/2016   Procedure: Left Heart Cath and Coronary Angiography;  Surgeon: Sherren Mocha, MD;  Location: [facility];  Service: Cardiovascular;  Laterality: N/A;  . heart stents  2006     Current Outpatient Prescriptions  Medication Sig Dispense Refill  . albuterol (PROVENTIL HFA;VENTOLIN HFA) 108 (90 BASE) MCG/ACT inhaler Inhale into the lungs every 6 (six) hours as needed  for wheezing or shortness of breath. Inhale 2 puffs every 6 hours as needed for breathing    . aspirin 81 MG chewable tablet Chew 81 mg by mouth daily.     Marland Kitchen buPROPion (ZYBAN) 150 MG 12 hr tablet Take 150 mg by mouth daily. 12hr    . clopidogrel (PLAVIX) 75 MG tablet Take 75 mg by mouth daily.    Marland Kitchen doxazosin (CARDURA) 1 MG tablet Take 1 mg by mouth daily.    Marland Kitchen gabapentin (NEURONTIN) 300 MG capsule Take 300 mg by mouth 2 (two) times daily.    . hydroxypropyl methylcellulose / hypromellose (ISOPTO TEARS / GONIOVISC) 2.5 % ophthalmic solution Place 1-2 drops into both eyes 3 (three) times daily as needed for dry eyes.    . isosorbide mononitrate (IMDUR) 30 MG 24 hr tablet Take 30 mg by mouth daily.    Marland Kitchen loratadine (CLARITIN) 10 MG tablet Take 10 mg by mouth daily.    . metFORMIN (GLUCOPHAGE) 500 MG tablet Take 1,000 mg by mouth daily with breakfast.    . meclizine (ANTIVERT) 25 MG tablet Take 25 mg by mouth 3 (three) times daily as needed for dizziness or nausea.     . nitroGLYCERIN (NITROSTAT) 0.4 MG SL tablet Place 0.4 mg under the tongue every 5 (five) minutes as needed for chest pain.    . sildenafil (  VIAGRA) 100 MG tablet Take 100 mg by mouth daily as needed for erectile dysfunction.     No current facility-administered medications for this visit.     Allergies:   Erythromycin and Simvastatin    Social History:  The patient  reports that he has quit smoking. He has never used smokeless tobacco. He reports that he does not drink alcohol or use drugs.   Family History:  The patient's family history is not on file.    ROS:  Please see the history of present illness.   Otherwise, review of systems are positive for knee pain.   All other systems are reviewed and negative.    PHYSICAL EXAM: VS:  BP 130/78 (BP Location: Right Arm, Patient Position: Sitting, Cuff Size: Large)   Pulse (!) 55   Ht '6\' 2"'$  (1.88 m)   Wt 105.1 kg (231 lb 12.8 oz)   SpO2 97%   BMI 29.76 kg/m  , BMI Body mass  index is 29.76 kg/m. GEN: Well nourished, well developed, in no acute distress  HEENT: normal  Neck: no JVD, carotid bruits, or masses Cardiac: RRR; no murmurs, rubs, or gallops,no edema  Respiratory:  clear to auscultation bilaterally, normal work of breathing GI: soft, nontender, nondistended, + BS MS: no deformity or atrophy ; right wrist scar - 2+ right radial pulse Skin: warm and dry, no rash Neuro:  Strength and sensation are intact Psych: euthymic mood, full affect    Recent Labs: 07/03/2016: Magnesium 1.9 07/04/2016: Hemoglobin 13.1; Platelets 150 07/05/2016: BUN 6; Creatinine, Ser 1.02; Potassium 3.9; Sodium 142 07/06/2016: TSH 1.786   Lipid Panel    Component Value Date/Time   CHOL  01/22/2008 0410    102        ATP III CLASSIFICATION:  <200     mg/dL   Desirable  200-239  mg/dL   Borderline High  >=240    mg/dL   High   TRIG 49 01/22/2008 0410   HDL 32 (L) 01/22/2008 0410   CHOLHDL 3.2 01/22/2008 0410   VLDL 10 01/22/2008 0410   LDLCALC  01/22/2008 0410    60        Total Cholesterol/HDL:CHD Risk Coronary Heart Disease Risk Table                     Men   Women  1/2 Average Risk   3.4   3.3     Other studies Reviewed: Additional studies/ records that were reviewed today with results demonstrating: Cath results reviewed.   ASSESSMENT AND PLAN:  1. CAD: Stop aspirin at this point. RCA cypher stent in 2006.  Recent cath showed patent stent and only other mild to moderate disease. Eats fish, no other meat for past 34 years.  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  Continue aggressive secondary prevention.  Increase exercise. 2. HTN: Controlled.   3. Diabetes: No checking often. Compliant with meds.  Followed by PMD.    4. We stressed the importance of increased exercise.   Current medicines are reviewed at length with the patient today.  The patient concerns regarding his medicines were addressed.  The following changes have been made:  No change  Labs/  tests ordered today include:  No orders of the defined types were placed in this encounter.   Recommend 150 minutes/week of aerobic exercise Low fat, low carb, high fiber diet recommended  Disposition:   FU in October 2018   Signed, Larae Grooms, MD  07/12/2016  [facility] [address], [place], [place]  [address] Phone: [phone]; Fax: [phone]

## 2016-07-12 ENCOUNTER — Encounter: Payer: Self-pay | Admitting: Interventional Cardiology

## 2016-07-12 ENCOUNTER — Ambulatory Visit (INDEPENDENT_AMBULATORY_CARE_PROVIDER_SITE_OTHER): Payer: Medicare Other | Admitting: Interventional Cardiology

## 2016-07-12 VITALS — BP 130/78 | HR 55 | Ht 74.0 in | Wt 231.8 lb

## 2016-07-12 DIAGNOSIS — I2510 Atherosclerotic heart disease of native coronary artery without angina pectoris: Secondary | ICD-10-CM | POA: Diagnosis not present

## 2016-07-12 DIAGNOSIS — E1159 Type 2 diabetes mellitus with other circulatory complications: Secondary | ICD-10-CM

## 2016-07-12 DIAGNOSIS — I10 Essential (primary) hypertension: Secondary | ICD-10-CM

## 2016-07-12 NOTE — Patient Instructions (Addendum)
Medication Instructions:  Stop taking Aspirin. All other medications remain the same.  Labwork: None  Testing/Procedures: None  Follow-Up: Your physician wants you to follow-up in: 9 months. You will receive a reminder letter in the mail two months in advance. If you don't receive a letter, please call our office to schedule the follow-up appointment.     If you need a refill on your cardiac medications before your next appointment, please call your pharmacy.

## 2017-04-04 ENCOUNTER — Ambulatory Visit (INDEPENDENT_AMBULATORY_CARE_PROVIDER_SITE_OTHER): Payer: Medicare Other | Admitting: Podiatry

## 2017-04-04 DIAGNOSIS — E0842 Diabetes mellitus due to underlying condition with diabetic polyneuropathy: Secondary | ICD-10-CM

## 2017-04-04 DIAGNOSIS — B351 Tinea unguium: Secondary | ICD-10-CM | POA: Diagnosis not present

## 2017-04-04 DIAGNOSIS — M79676 Pain in unspecified toe(s): Secondary | ICD-10-CM

## 2017-04-04 NOTE — Progress Notes (Signed)
   SUBJECTIVE Patient with a history of diabetes mellitus presents to office today complaining of elongated, thickened nails. Pain while ambulating in shoes. Patient is unable to trim their own nails.   OBJECTIVE General Patient is awake, alert, and oriented x 3 and in no acute distress. Derm Skin is dry and supple bilateral. Negative open lesions or macerations. Remaining integument unremarkable. Nails are tender, long, thickened and dystrophic with subungual debris, consistent with onychomycosis, 1-5 bilateral. No signs of infection noted. Vasc  DP and PT pedal pulses palpable bilaterally. Temperature gradient within normal limits.  Neuro Epicritic and protective threshold sensation diminished bilaterally.  Musculoskeletal Exam No symptomatic pedal deformities noted bilateral. Muscular strength within normal limits.  ASSESSMENT 1. Diabetes Mellitus w/ peripheral neuropathy 2. Onychomycosis of nail due to dermatophyte bilateral 3. Pain in foot bilateral  PLAN OF CARE 1. Patient evaluated today. 2. Instructed to maintain good pedal hygiene and foot care. Stressed importance of controlling blood sugar.  3. Mechanical debridement of nails 1-5 bilaterally performed using a nail nipper. Filed with dremel without incident.  4. Return to clinic in 3 mos.     Clayten Allcock M. Cammie Faulstich, DPM [facility]  Dr. Bart Ashford M. Trentyn Boisclair, DPM    [address]                                        [place], [place] [address]                Office [phone]  Fax [phone]       

## 2017-06-02 ENCOUNTER — Encounter: Payer: Self-pay | Admitting: Interventional Cardiology

## 2017-06-02 ENCOUNTER — Ambulatory Visit (INDEPENDENT_AMBULATORY_CARE_PROVIDER_SITE_OTHER): Payer: Medicare Other | Admitting: Interventional Cardiology

## 2017-06-02 VITALS — BP 112/82 | HR 58 | Ht 74.0 in

## 2017-06-02 DIAGNOSIS — I10 Essential (primary) hypertension: Secondary | ICD-10-CM | POA: Diagnosis not present

## 2017-06-02 DIAGNOSIS — E1159 Type 2 diabetes mellitus with other circulatory complications: Secondary | ICD-10-CM

## 2017-06-02 DIAGNOSIS — Z720 Tobacco use: Secondary | ICD-10-CM

## 2017-06-02 DIAGNOSIS — I2510 Atherosclerotic heart disease of native coronary artery without angina pectoris: Secondary | ICD-10-CM

## 2017-06-02 NOTE — Patient Instructions (Signed)

## 2017-06-02 NOTE — Progress Notes (Addendum)
Cardiology Office Note   Date:  06/02/2017   ID:  [name], DOB Aug 25, 1945, MRN [id number]  PCP:  Patient, No Pcp Per    No chief complaint on file.  CAD  Wt Readings from Last 3 Encounters:  07/12/16 231 lb 12.8 oz (105.1 kg)  07/06/16 224 lb 8 oz (101.8 kg)       History of Present Illness: [name] is a 71 y.o. male  with a past medical history significant for CAD s/p PCI to the RCA in 2006, HTN, NIDDM, and smokingwho presents with chest pain. In 11/17, he developed a new left-sided chest discomfort, radiating to the left shoulder neck and arm. This was worsened with exertion, relieved with rest, and associated with nausea.  He had an episode of gross hematuria and came to the hospital.  He had a cath showing a patent stent and no further obstructive CAD.   He was treated for UTI with ABx, which was thought to be the cause of the hematuria. Aspirin and Pavix were resumed after negative CT urogram.  Since last visit, he has done well.  He felt sluggish at times when checking the mail in the heat.  He has not been doing any regular exercise.   Denies : Chest pain. Dizziness. Leg edema. Nitroglycerin use. Orthopnea. Palpitations. Paroxysmal nocturnal dyspnea. Shortness of breath. Syncope.   Occasional constipation.    Walking limited by knee pain.       Past Medical History:  Diagnosis Date  . Asthma   . Coronary artery disease   . Diabetes mellitus without complication ([place])   . History of kidney stones   . Hypertension   . Peripheral vascular disease [facility])     Past Surgical History:  Procedure Laterality Date  . CARDIAC CATHETERIZATION N/A 07/05/2016   Procedure: Left Heart Cath and Coronary Angiography;  Surgeon: Sherren Mocha, MD;  Location: [place] [facility];  Service: Cardiovascular;  Laterality: N/A;  . CORONARY ANGIOPLASTY    . heart stents  2006     Current Outpatient Prescriptions  Medication Sig Dispense Refill  . albuterol  (PROVENTIL HFA;VENTOLIN HFA) 108 (90 BASE) MCG/ACT inhaler Inhale into the lungs every 6 (six) hours as needed for wheezing or shortness of breath. Inhale 2 puffs every 6 hours as needed for breathing    . buPROPion (ZYBAN) 150 MG 12 hr tablet Take 150 mg by mouth daily. 12hr    . clopidogrel (PLAVIX) 75 MG tablet Take 75 mg by mouth daily.    Marland Kitchen doxazosin (CARDURA) 1 MG tablet Take 1 mg by mouth daily.    . finasteride (PROSCAR) 5 MG tablet Take 5 mg by mouth daily.    Marland Kitchen gabapentin (NEURONTIN) 300 MG capsule Take 300 mg by mouth 2 (two) times daily.    . hydroxypropyl methylcellulose / hypromellose (ISOPTO TEARS / GONIOVISC) 2.5 % ophthalmic solution Place 1-2 drops into both eyes 3 (three) times daily as needed for dry eyes.    . isosorbide mononitrate (IMDUR) 30 MG 24 hr tablet Take 30 mg by mouth daily.    Marland Kitchen loratadine (CLARITIN) 10 MG tablet Take 10 mg by mouth daily.    . meclizine (ANTIVERT) 25 MG tablet Take 25 mg by mouth 3 (three) times daily as needed for dizziness or nausea.     . metFORMIN (GLUCOPHAGE) 500 MG tablet Take 1,000 mg by mouth daily with breakfast.    . nitroGLYCERIN (NITROSTAT) 0.4 MG SL tablet Place 0.4 mg under  the tongue every 5 (five) minutes as needed for chest pain.    . sildenafil (VIAGRA) 100 MG tablet Take 100 mg by mouth daily as needed for erectile dysfunction.     No current facility-administered medications for this visit.     Allergies:   Erythromycin and Simvastatin    Social History:  The patient  reports that he has quit smoking. He has never used smokeless tobacco. He reports that he does not drink alcohol or use drugs.   Family History:  The patient's family history includes CAD in his brother and mother.    ROS:  Please see the history of present illness.   Otherwise, review of systems are positive for *knee pain.   All other systems are reviewed and negative.    PHYSICAL EXAM: VS:  BP 112/82   Pulse (!) 58   Ht 6\' 2"  (1.88 m)   SpO2 96%  ,  BMI There is no height or weight on file to calculate BMI. GEN: Well nourished, well developed, in no acute distress  HEENT: normal  Neck: no JVD, carotid bruits, or masses Cardiac: RRR; no murmurs, rubs, or gallops,no edema  Respiratory:  clear to auscultation bilaterally, normal work of breathing GI: soft, nontender, nondistended, + BS MS: no deformity or atrophy  Skin: warm and dry, no rash Neuro:  Strength and sensation are intact Psych: euthymic mood, full affect   EKG:   The ekg ordered today demonstrates normal ECG, no ST changes   Recent Labs: 07/03/2016: Magnesium 1.9 07/04/2016: Hemoglobin 13.1; Platelets 150 07/05/2016: BUN 6; Creatinine, Ser 1.02; Potassium 3.9; Sodium 142 07/06/2016: TSH 1.786   Lipid Panel    Component Value Date/Time   CHOL  01/22/2008 0410    102        ATP III CLASSIFICATION:  <200     mg/dL   Desirable  200-239  mg/dL   Borderline High  >=240    mg/dL   High   TRIG 49 01/22/2008 0410   HDL 32 (L) 01/22/2008 0410   CHOLHDL 3.2 01/22/2008 0410   VLDL 10 01/22/2008 0410   LDLCALC  01/22/2008 0410    60        Total Cholesterol/HDL:CHD Risk Coronary Heart Disease Risk Table                     Men   Women  1/2 Average Risk   3.4   3.3     Other studies Reviewed: Additional studies/ records that were reviewed today with results demonstrating: 2017 cath showed patent stent, and mild disease elswhere.   ASSESSMENT AND PLAN:  1. CAD:  No angina.  COntinue aggressive secondary prevention.  Lipids checked at the [place]. 2. Tobacco abuse: He has cut back.  He tried gum and the patch but did not tolerate.  He does not want to try Chantix.  Stil using buproprion. 3. DM: Continue aggressive control.  Managed by PMD.  Well controlled per the patient.   4. HTN: Well controlled.  COntinue current meds.    Current medicines are reviewed at length with the patient today.  The patient concerns regarding his medicines were addressed.  The following  changes have been made:  No change  Labs/ tests ordered today include:  No orders of the defined types were placed in this encounter.   Recommend 150 minutes/week of aerobic exercise Low fat, low carb, high fiber diet recommended  Disposition:   FU in 1 year  Signed, Larae Grooms, MD  06/02/2017 3:21 PM    [facility] [place], [place], [place]  [address] Phone: [phone]; Fax: [phone]

## 2017-07-04 ENCOUNTER — Ambulatory Visit (INDEPENDENT_AMBULATORY_CARE_PROVIDER_SITE_OTHER): Payer: Medicare Other | Admitting: Podiatry

## 2017-07-04 ENCOUNTER — Encounter: Payer: Self-pay | Admitting: Podiatry

## 2017-07-04 DIAGNOSIS — M79676 Pain in unspecified toe(s): Secondary | ICD-10-CM | POA: Diagnosis not present

## 2017-07-04 DIAGNOSIS — B351 Tinea unguium: Secondary | ICD-10-CM | POA: Diagnosis not present

## 2017-07-04 DIAGNOSIS — E1142 Type 2 diabetes mellitus with diabetic polyneuropathy: Secondary | ICD-10-CM

## 2017-07-04 DIAGNOSIS — D689 Coagulation defect, unspecified: Secondary | ICD-10-CM

## 2017-07-04 NOTE — Progress Notes (Signed)
Patient ID: [name], male   DOB: Jul 13, 1946, 71 y.o.   MRN: [id number]   Subjective: Patient presents today complaining of elongated thickened toenails for cough walking wearing shoes and request toenail debridement. Patient for the past several years has been going to [place] for podiatric care, however, at this time podiatrist not available at [place] in [facility]  Patient is diabetic with a history of burning, stinging numbness in his feet on and off weightbearing Patient continues to smoke  Patient on anticoagulant therapy, Plavix  Objective: Orientated 3 DP pulses 0/4 bilaterally Pulses 2/4 bilaterally Capillary reflex within normal limits bilaterally Sensation to 10 g monofilament wire intact 8/8 bilaterally Vibratory sensation reactive bilaterally Ankle reflexes reactive bilaterally No skin lesions bilaterally Toenails elongated, incurvated, discolored and tender direct palpation 6-10 Atrophic skin with absent hair growth bilaterally HAV bilaterally Hammertoe second bilaterally Thickening within plantar fascial band left Manual motor testing dorsi flexion, plantar flexion 5/5 bilaterally  Assessment: 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

## 2017-07-04 NOTE — Patient Instructions (Signed)

## 2017-10-03 ENCOUNTER — Encounter: Payer: Self-pay | Admitting: Podiatry

## 2017-10-03 ENCOUNTER — Ambulatory Visit (INDEPENDENT_AMBULATORY_CARE_PROVIDER_SITE_OTHER): Payer: Medicare Other | Admitting: Podiatry

## 2017-10-03 DIAGNOSIS — B351 Tinea unguium: Secondary | ICD-10-CM

## 2017-10-03 DIAGNOSIS — M79676 Pain in unspecified toe(s): Secondary | ICD-10-CM

## 2017-10-03 DIAGNOSIS — E0842 Diabetes mellitus due to underlying condition with diabetic polyneuropathy: Secondary | ICD-10-CM

## 2017-10-04 NOTE — Progress Notes (Signed)
   SUBJECTIVE Patient with a history of diabetes mellitus presents to office today complaining of elongated, thickened nails. Pain while ambulating in shoes. Patient is unable to trim their own nails.   Past Medical History:  Diagnosis Date  . Asthma   . Coronary artery disease   . Diabetes mellitus without complication ([place])   . History of kidney stones   . Hypertension   . Peripheral vascular disease ([place])     OBJECTIVE General Patient is awake, alert, and oriented x 3 and in no acute distress. Derm Skin is dry and supple bilateral. Negative open lesions or macerations. Remaining integument unremarkable. Nails are tender, long, thickened and dystrophic with subungual debris, consistent with onychomycosis, 1-5 bilateral. No signs of infection noted. Vasc  DP and PT pedal pulses palpable bilaterally. Temperature gradient within normal limits.  Neuro Epicritic and protective threshold sensation diminished bilaterally.  Musculoskeletal Exam No symptomatic pedal deformities noted bilateral. Muscular strength within normal limits.  ASSESSMENT 1. Diabetes Mellitus w/ peripheral neuropathy 2. Onychomycosis of nail due to dermatophyte bilateral 3. Pain in foot bilateral  PLAN OF CARE 1. Patient evaluated today. 2. Instructed to maintain good pedal hygiene and foot care. Stressed importance of controlling blood sugar.  3. Mechanical debridement of nails 1-5 bilaterally performed using a nail nipper. Filed with dremel without incident.  4. Return to clinic in 3 mos.     Edrick Kins, DPM [facility]  Dr. Edrick Kins, [address]                                        [place], [place] [address]                Office [phone]  Fax [phone]

## 2018-01-02 ENCOUNTER — Ambulatory Visit (INDEPENDENT_AMBULATORY_CARE_PROVIDER_SITE_OTHER): Payer: Medicare Other | Admitting: Podiatry

## 2018-01-02 DIAGNOSIS — B351 Tinea unguium: Secondary | ICD-10-CM

## 2018-01-02 DIAGNOSIS — M79676 Pain in unspecified toe(s): Secondary | ICD-10-CM | POA: Diagnosis not present

## 2018-01-02 DIAGNOSIS — E0842 Diabetes mellitus due to underlying condition with diabetic polyneuropathy: Secondary | ICD-10-CM

## 2018-01-04 NOTE — Progress Notes (Signed)
   SUBJECTIVE Patient with a history of diabetes mellitus presents to office today complaining of elongated, thickened nails that cause pain while ambulating in shoes. He is unable to trim his own nails. Patient is here for further evaluation and treatment.   Past Medical History:  Diagnosis Date  . Asthma   . Coronary artery disease   . Diabetes mellitus without complication ([place])   . History of kidney stones   . Hypertension   . Peripheral vascular disease ([place])     OBJECTIVE General Patient is awake, alert, and oriented x 3 and in no acute distress. Derm Skin is dry and supple bilateral. Negative open lesions or macerations. Remaining integument unremarkable. Nails are tender, long, thickened and dystrophic with subungual debris, consistent with onychomycosis, 1-5 bilateral. No signs of infection noted. Vasc  DP and PT pedal pulses palpable bilaterally. Temperature gradient within normal limits.  Neuro Epicritic and protective threshold sensation diminished bilaterally.  Musculoskeletal Exam No symptomatic pedal deformities noted bilateral. Muscular strength within normal limits.  ASSESSMENT 1. Diabetes Mellitus w/ peripheral neuropathy 2. Onychomycosis of nail due to dermatophyte bilateral 3. Pain in foot bilateral  PLAN OF CARE 1. Patient evaluated today. 2. Instructed to maintain good pedal hygiene and foot care. Stressed importance of controlling blood sugar.  3. Mechanical debridement of nails 1-5 bilaterally performed using a nail nipper. Filed with dremel without incident.  4. Return to clinic in 3 mos.     Edrick Kins, DPM [facility]  Dr. Edrick Kins, [address]                                        [place], [place] [address]                Office [phone]  Fax [phone]

## 2018-04-03 ENCOUNTER — Encounter: Payer: Medicare Other | Admitting: Podiatry

## 2018-04-10 NOTE — Progress Notes (Signed)
This encounter was created in error - please disregard.

## 2018-05-01 ENCOUNTER — Ambulatory Visit (INDEPENDENT_AMBULATORY_CARE_PROVIDER_SITE_OTHER): Payer: Medicare Other | Admitting: Podiatry

## 2018-05-01 DIAGNOSIS — E0842 Diabetes mellitus due to underlying condition with diabetic polyneuropathy: Secondary | ICD-10-CM

## 2018-05-01 DIAGNOSIS — M79676 Pain in unspecified toe(s): Secondary | ICD-10-CM

## 2018-05-01 DIAGNOSIS — B351 Tinea unguium: Secondary | ICD-10-CM

## 2018-05-03 NOTE — Progress Notes (Signed)
   SUBJECTIVE Patient with a history of diabetes mellitus presents to office today complaining of elongated, thickened nails that cause pain while ambulating in shoes. He is unable to trim his own nails. Patient is here for further evaluation and treatment.   Past Medical History:  Diagnosis Date  . Asthma   . Coronary artery disease   . Diabetes mellitus without complication ([place])   . History of kidney stones   . Hypertension   . Peripheral vascular disease ([place])     OBJECTIVE General Patient is awake, alert, and oriented x 3 and in no acute distress. Derm Skin is dry and supple bilateral. Negative open lesions or macerations. Remaining integument unremarkable. Nails are tender, long, thickened and dystrophic with subungual debris, consistent with onychomycosis, 1-5 bilateral. No signs of infection noted. Vasc  DP and PT pedal pulses palpable bilaterally. Temperature gradient within normal limits.  Neuro Epicritic and protective threshold sensation diminished bilaterally.  Musculoskeletal Exam No symptomatic pedal deformities noted bilateral. Muscular strength within normal limits.  ASSESSMENT 1. Diabetes Mellitus w/ peripheral neuropathy 2. Onychomycosis of nail due to dermatophyte bilateral 3. Pain in foot bilateral  PLAN OF CARE 1. Patient evaluated today. 2. Instructed to maintain good pedal hygiene and foot care. Stressed importance of controlling blood sugar.  3. Mechanical debridement of nails 1-5 bilaterally performed using a nail nipper. Filed with dremel without incident.  4. Return to clinic in 3 mos.     Edrick Kins, DPM [facility]  Dr. Edrick Kins, [address]                                        [place], [place] [address]                Office [phone]  Fax [phone]

## 2018-07-31 ENCOUNTER — Ambulatory Visit (INDEPENDENT_AMBULATORY_CARE_PROVIDER_SITE_OTHER): Payer: Medicare Other | Admitting: Podiatry

## 2018-07-31 ENCOUNTER — Encounter: Payer: Self-pay | Admitting: Podiatry

## 2018-07-31 DIAGNOSIS — M79676 Pain in unspecified toe(s): Secondary | ICD-10-CM

## 2018-07-31 DIAGNOSIS — B351 Tinea unguium: Secondary | ICD-10-CM

## 2018-07-31 DIAGNOSIS — E0842 Diabetes mellitus due to underlying condition with diabetic polyneuropathy: Secondary | ICD-10-CM

## 2018-08-01 NOTE — Progress Notes (Signed)
   SUBJECTIVE Patient with a history of diabetes mellitus presents to office today complaining of elongated, thickened nails that cause pain while ambulating in shoes. He is unable to trim his own nails. Patient is here for further evaluation and treatment.   Past Medical History:  Diagnosis Date  . Asthma   . Coronary artery disease   . Diabetes mellitus without complication ([place])   . History of kidney stones   . Hypertension   . Peripheral vascular disease ([place])     OBJECTIVE General Patient is awake, alert, and oriented x 3 and in no acute distress. Derm Skin is dry and supple bilateral. Negative open lesions or macerations. Remaining integument unremarkable. Nails are tender, long, thickened and dystrophic with subungual debris, consistent with onychomycosis, 1-5 bilateral. No signs of infection noted. Vasc  DP and PT pedal pulses palpable bilaterally. Temperature gradient within normal limits.  Neuro Epicritic and protective threshold sensation diminished bilaterally.  Musculoskeletal Exam No symptomatic pedal deformities noted bilateral. Muscular strength within normal limits.  ASSESSMENT 1. Diabetes Mellitus w/ peripheral neuropathy 2. Onychomycosis of nail due to dermatophyte bilateral 3. Pain in foot bilateral  PLAN OF CARE 1. Patient evaluated today. 2. Instructed to maintain good pedal hygiene and foot care. Stressed importance of controlling blood sugar.  3. Mechanical debridement of nails 1-5 bilaterally performed using a nail nipper. Filed with dremel without incident.  4. Return to clinic in 3 mos.     Edrick Kins, DPM [facility]  Dr. Edrick Kins, [address]                                        [place], [place] [address]                Office [phone]  Fax [phone]

## 2018-10-26 ENCOUNTER — Ambulatory Visit (INDEPENDENT_AMBULATORY_CARE_PROVIDER_SITE_OTHER): Payer: Medicare Other | Admitting: Podiatry

## 2018-10-26 DIAGNOSIS — M79676 Pain in unspecified toe(s): Secondary | ICD-10-CM | POA: Diagnosis not present

## 2018-10-26 DIAGNOSIS — E1142 Type 2 diabetes mellitus with diabetic polyneuropathy: Secondary | ICD-10-CM

## 2018-10-26 DIAGNOSIS — B351 Tinea unguium: Secondary | ICD-10-CM

## 2018-10-26 NOTE — Patient Instructions (Signed)
Diabetes Mellitus and Foot Care Foot care is an important part of your health, especially when you have diabetes. Diabetes may cause you to have problems because of poor blood flow (circulation) to your feet and legs, which can cause your skin to:  Become thinner and drier.  Break more easily.  Heal more slowly.  Peel and crack. You may also have nerve damage (neuropathy) in your legs and feet, causing decreased feeling in them. This means that you may not notice minor injuries to your feet that could lead to more serious problems. Noticing and addressing any potential problems early is the best way to prevent future foot problems. How to care for your feet Foot hygiene  Wash your feet daily with warm water and mild soap. Do not use hot water. Then, pat your feet and the areas between your toes until they are completely dry. Do not soak your feet as this can dry your skin.  Trim your toenails straight across. Do not dig under them or around the cuticle. File the edges of your nails with an emery board or nail file.  Apply a moisturizing lotion or petroleum jelly to the skin on your feet and to dry, brittle toenails. Use lotion that does not contain alcohol and is unscented. Do not apply lotion between your toes. Shoes and socks  Wear clean socks or stockings every day. Make sure they are not too tight. Do not wear knee-high stockings since they may decrease blood flow to your legs.  Wear shoes that fit properly and have enough cushioning. Always look in your shoes before you put them on to be sure there are no objects inside.  To break in new shoes, wear them for just a few hours a day. This prevents injuries on your feet. Wounds, scrapes, corns, and calluses  Check your feet daily for blisters, cuts, bruises, sores, and redness. If you cannot see the bottom of your feet, use a mirror or ask someone for help.  Do not cut corns or calluses or try to remove them with medicine.  If you  find a minor scrape, cut, or break in the skin on your feet, keep it and the skin around it clean and dry. You may clean these areas with mild soap and water. Do not clean the area with peroxide, alcohol, or iodine.  If you have a wound, scrape, corn, or callus on your foot, look at it several times a day to make sure it is healing and not infected. Check for: ? Redness, swelling, or pain. ? Fluid or blood. ? Warmth. ? Pus or a bad smell. General instructions  Do not cross your legs. This may decrease blood flow to your feet.  Do not use heating pads or hot water bottles on your feet. They may burn your skin. If you have lost feeling in your feet or legs, you may not know this is happening until it is too late.  Protect your feet from hot and cold by wearing shoes, such as at the beach or on hot pavement.  Schedule a complete foot exam at least once a year (annually) or more often if you have foot problems. If you have foot problems, report any cuts, sores, or bruises to your health care provider immediately. Contact a health care provider if:  You have a medical condition that increases your risk of infection and you have any cuts, sores, or bruises on your feet.  You have an injury that is not   healing.  You have redness on your legs or feet.  You feel burning or tingling in your legs or feet.  You have pain or cramps in your legs and feet.  Your legs or feet are numb.  Your feet always feel cold.  You have pain around a toenail. Get help right away if:  You have a wound, scrape, corn, or callus on your foot and: ? You have pain, swelling, or redness that gets worse. ? You have fluid or blood coming from the wound, scrape, corn, or callus. ? Your wound, scrape, corn, or callus feels warm to the touch. ? You have pus or a bad smell coming from the wound, scrape, corn, or callus. ? You have a fever. ? You have a red line going up your leg. Summary  Check your feet every day  for cuts, sores, red spots, swelling, and blisters.  Moisturize feet and legs daily.  Wear shoes that fit properly and have enough cushioning.  If you have foot problems, report any cuts, sores, or bruises to your health care provider immediately.  Schedule a complete foot exam at least once a year (annually) or more often if you have foot problems. This information is not intended to replace advice given to you by your health care provider. Make sure you discuss any questions you have with your health care provider. Document Released: 08/06/2000 Document Revised: 09/21/2017 Document Reviewed: 09/10/2016 Elsevier Interactive Patient Education  2019 [employer]  Onychomycosis/Fungal Toenails  WHAT IS IT? An infection that lies within the keratin of your nail plate that is caused by a fungus.  WHY ME? Fungal infections affect all ages, sexes, races, and creeds.  There may be many factors that predispose you to a fungal infection such as age, coexisting medical conditions such as diabetes, or an autoimmune disease; stress, medications, fatigue, genetics, etc.  Bottom line: fungus thrives in a warm, moist environment and your shoes offer such a location.  IS IT CONTAGIOUS? Theoretically, yes.  You do not want to share shoes, nail clippers or files with someone who has fungal toenails.  Walking around barefoot in the same room or sleeping in the same bed is unlikely to transfer the organism.  It is important to realize, however, that fungus can spread easily from one nail to the next on the same foot.  HOW DO WE TREAT THIS?  There are several ways to treat this condition.  Treatment may depend on many factors such as age, medications, pregnancy, liver and kidney conditions, etc.  It is best to ask your doctor which options are available to you.  1. No treatment.   Unlike many other medical concerns, you can live with this condition.  However for many people this can be a painful condition and  may lead to ingrown toenails or a bacterial infection.  It is recommended that you keep the nails cut short to help reduce the amount of fungal nail. 2. Topical treatment.  These range from herbal remedies to prescription strength nail lacquers.  About 40-50% effective, topicals require twice daily application for approximately 9 to 12 months or until an entirely new nail has grown out.  The most effective topicals are medical grade medications available through physicians offices. 3. Oral antifungal medications.  With an 80-90% cure rate, the most common oral medication requires 3 to 4 months of therapy and stays in your system for a year as the new nail grows out.  Oral antifungal medications do require   blood work to make sure it is a safe drug for you.  A liver function panel will be performed prior to starting the medication and after the first month of treatment.  It is important to have the blood work performed to avoid any harmful side effects.  In general, this medication safe but blood work is required. 4. Laser Therapy.  This treatment is performed by applying a specialized laser to the affected nail plate.  This therapy is noninvasive, fast, and non-painful.  It is not covered by insurance and is therefore, out of pocket.  The results have been very good with a 80-95% cure rate.  The [facility] is the only practice in the area to offer this therapy. 5. Permanent Nail Avulsion.  Removing the entire nail so that a new nail will not grow back. 

## 2018-11-05 ENCOUNTER — Encounter: Payer: Self-pay | Admitting: Podiatry

## 2018-11-05 NOTE — Progress Notes (Signed)
Subjective: [name] presents today with history of neuropathy with cc of painful, mycotic toenails.  Pain is aggravated when wearing enclosed shoe gear and relieved with periodic professional debridement.  Patient has peripheral neuropathy managed with gabapentin.  He is also on blood thinner, Plavix.  [place] is where his PCP is located.   Current Outpatient Medications:  .  acetaminophen (TYLENOL) 325 MG tablet, Take 650 mg by mouth every 6 (six) hours as needed., Disp: , Rfl:  .  albuterol (PROVENTIL HFA;VENTOLIN HFA) 108 (90 BASE) MCG/ACT inhaler, Inhale into the lungs every 6 (six) hours as needed for wheezing or shortness of breath. Inhale 2 puffs every 6 hours as needed for breathing, Disp: , Rfl:  .  buPROPion (ZYBAN) 150 MG 12 hr tablet, Take 150 mg by mouth daily. 12hr, Disp: , Rfl:  .  cholecalciferol (VITAMIN D) 1000 units tablet, Take 2,000 Units by mouth daily., Disp: , Rfl:  .  clopidogrel (PLAVIX) 75 MG tablet, Take 75 mg by mouth daily., Disp: , Rfl:  .  diclofenac sodium (VOLTAREN) 1 % GEL, Apply 4 g topically 3 (three) times daily as needed., Disp: , Rfl:  .  doxazosin (CARDURA) 1 MG tablet, Take 1 mg by mouth daily., Disp: , Rfl:  .  finasteride (PROSCAR) 5 MG tablet, Take 5 mg by mouth daily., Disp: , Rfl:  .  gabapentin (NEURONTIN) 300 MG capsule, Take 300 mg by mouth 2 (two) times daily., Disp: , Rfl:  .  hydroxypropyl methylcellulose / hypromellose (ISOPTO TEARS / GONIOVISC) 2.5 % ophthalmic solution, Place 1-2 drops into both eyes 3 (three) times daily as needed for dry eyes., Disp: , Rfl:  .  isosorbide mononitrate (IMDUR) 30 MG 24 hr tablet, Take 30 mg by mouth daily., Disp: , Rfl:  .  lisinopril (PRINIVIL,ZESTRIL) 10 MG tablet, , Disp: , Rfl:  .  loratadine (CLARITIN) 10 MG tablet, Take 10 mg by mouth daily., Disp: , Rfl:  .  Lysine 1000 MG TABS, Take 1,000 mg by mouth 2 (two) times daily., Disp: , Rfl:  .  meclizine (ANTIVERT) 25 MG tablet, Take  25 mg by mouth 3 (three) times daily as needed for dizziness or nausea. , Disp: , Rfl:  .  metFORMIN (GLUCOPHAGE-XR) 500 MG 24 hr tablet, , Disp: , Rfl:  .  nitroGLYCERIN (NITROSTAT) 0.4 MG SL tablet, Place 0.4 mg under the tongue every 5 (five) minutes as needed for chest pain., Disp: , Rfl:  .  Olodaterol HCl 2.5 MCG/ACT AERS, Inhale 2 puffs into the lungs daily., Disp: , Rfl:  .  OVER THE COUNTER MEDICATION, , Disp: , Rfl:  .  petrolatum-hydrophilic-aloe vera (ALOE VESTA) ointment, Apply 1 application topically 3 (three) times daily., Disp: , Rfl:  .  rosuvastatin (CRESTOR) 20 MG tablet, Take 20 mg by mouth daily., Disp: , Rfl:  .  sildenafil (VIAGRA) 100 MG tablet, Take 100 mg by mouth daily as needed for erectile dysfunction., Disp: , Rfl:  .  tamsulosin (FLOMAX) 0.4 MG CAPS capsule, Take 0.4 mg by mouth daily., Disp: , Rfl:   Allergies  Allergen Reactions  . Erythromycin Shortness Of Breath and Swelling  . Simvastatin     CAUSE MUSCLE PAIN,.    Objective:  Vascular Examination: Capillary refill time immediate x 10 digits.  Dorsalis pedis and Posterior tibial pulses palpable b/l.  Digital hair x 10 digits was sparse.  Skin temperature gradient WNL b/l.  Dermatological Examination: Skin with normal turgor, texture and  tone b/l.  Toenails 1-5 b/l discolored, thick, dystrophic with subungual debris and pain with palpation to nailbeds due to thickness of nails.  Musculoskeletal: Muscle strength 5/5 to all muscle groups b/l.  Neurological: Sensation with 10 gram monofilament is absent b/l.  Vibratory sensation absent b/l.  Assessment: 1. Painful onychomycosis toenails 1-5 b/l 2. NIDDM with neuropathy  Plan: 1. Toenails 1-5 b/l were debrided in length and girth without iatrogenic bleeding. 2. Patient to continue soft, supportive shoe gear 3. Patient to report any pedal injuries to medical professional  4. Follow up 3 months.  5. Patient/POA to call should there be a  concern in the interim.

## 2019-02-01 ENCOUNTER — Ambulatory Visit: Payer: Medicare Other | Admitting: Podiatry

## 2019-02-14 ENCOUNTER — Encounter: Payer: Self-pay | Admitting: Podiatry

## 2019-02-14 ENCOUNTER — Ambulatory Visit: Payer: Medicare Other | Admitting: Podiatry

## 2019-02-14 ENCOUNTER — Other Ambulatory Visit: Payer: Self-pay

## 2019-02-14 ENCOUNTER — Ambulatory Visit (INDEPENDENT_AMBULATORY_CARE_PROVIDER_SITE_OTHER): Payer: Medicare Other | Admitting: Podiatry

## 2019-02-14 DIAGNOSIS — M79676 Pain in unspecified toe(s): Secondary | ICD-10-CM

## 2019-02-14 DIAGNOSIS — B351 Tinea unguium: Secondary | ICD-10-CM | POA: Diagnosis not present

## 2019-02-14 NOTE — Patient Instructions (Signed)
Diabetes Mellitus and Foot Care  Foot care is an important part of your health, especially when you have diabetes. Diabetes may cause you to have problems because of poor blood flow (circulation) to your feet and legs, which can cause your skin to:   Become thinner and drier.   Break more easily.   Heal more slowly.   Peel and crack.  You may also have nerve damage (neuropathy) in your legs and feet, causing decreased feeling in them. This means that you may not notice minor injuries to your feet that could lead to more serious problems. Noticing and addressing any potential problems early is the best way to prevent future foot problems.  How to care for your feet  Foot hygiene   Wash your feet daily with warm water and mild soap. Do not use hot water. Then, pat your feet and the areas between your toes until they are completely dry. Do not soak your feet as this can dry your skin.   Trim your toenails straight across. Do not dig under them or around the cuticle. File the edges of your nails with an emery board or nail file.   Apply a moisturizing lotion or petroleum jelly to the skin on your feet and to dry, brittle toenails. Use lotion that does not contain alcohol and is unscented. Do not apply lotion between your toes.  Shoes and socks   Wear clean socks or stockings every day. Make sure they are not too tight. Do not wear knee-high stockings since they may decrease blood flow to your legs.   Wear shoes that fit properly and have enough cushioning. Always look in your shoes before you put them on to be sure there are no objects inside.   To break in new shoes, wear them for just a few hours a day. This prevents injuries on your feet.  Wounds, scrapes, corns, and calluses   Check your feet daily for blisters, cuts, bruises, sores, and redness. If you cannot see the bottom of your feet, use a mirror or ask someone for help.   Do not cut corns or calluses or try to remove them with medicine.   If you  find a minor scrape, cut, or break in the skin on your feet, keep it and the skin around it clean and dry. You may clean these areas with mild soap and water. Do not clean the area with peroxide, alcohol, or iodine.   If you have a wound, scrape, corn, or callus on your foot, look at it several times a day to make sure it is healing and not infected. Check for:  ? Redness, swelling, or pain.  ? Fluid or blood.  ? Warmth.  ? Pus or a bad smell.  General instructions   Do not cross your legs. This may decrease blood flow to your feet.   Do not use heating pads or hot water bottles on your feet. They may burn your skin. If you have lost feeling in your feet or legs, you may not know this is happening until it is too late.   Protect your feet from hot and cold by wearing shoes, such as at the beach or on hot pavement.   Schedule a complete foot exam at least once a year (annually) or more often if you have foot problems. If you have foot problems, report any cuts, sores, or bruises to your health care provider immediately.  Contact a health care provider if:     You have a medical condition that increases your risk of infection and you have any cuts, sores, or bruises on your feet.   You have an injury that is not healing.   You have redness on your legs or feet.   You feel burning or tingling in your legs or feet.   You have pain or cramps in your legs and feet.   Your legs or feet are numb.   Your feet always feel cold.   You have pain around a toenail.  Get help right away if:   You have a wound, scrape, corn, or callus on your foot and:  ? You have pain, swelling, or redness that gets worse.  ? You have fluid or blood coming from the wound, scrape, corn, or callus.  ? Your wound, scrape, corn, or callus feels warm to the touch.  ? You have pus or a bad smell coming from the wound, scrape, corn, or callus.  ? You have a fever.  ? You have a red line going up your leg.  Summary   Check your feet every day  for cuts, sores, red spots, swelling, and blisters.   Moisturize feet and legs daily.   Wear shoes that fit properly and have enough cushioning.   If you have foot problems, report any cuts, sores, or bruises to your health care provider immediately.   Schedule a complete foot exam at least once a year (annually) or more often if you have foot problems.  This information is not intended to replace advice given to you by your health care provider. Make sure you discuss any questions you have with your health care provider.  Document Released: 08/06/2000 Document Revised: 09/21/2017 Document Reviewed: 09/10/2016  Elsevier Interactive Patient Education  2019 [employer]

## 2019-02-22 NOTE — Progress Notes (Signed)
Subjective:  [name] presents to clinic today with cc of  painful, thick, discolored, elongated toenails 1-5 b/l that become tender and cannot cut because of thickness.  Pain is aggravated when wearing enclosed shoe gear.  [place] is where his PCP is located. He relates PCP's name is L. Tegen.    Current Outpatient Medications:  .  acetaminophen (TYLENOL) 325 MG tablet, Take 650 mg by mouth every 6 (six) hours as needed., Disp: , Rfl:  .  albuterol (PROVENTIL HFA;VENTOLIN HFA) 108 (90 BASE) MCG/ACT inhaler, Inhale into the lungs every 6 (six) hours as needed for wheezing or shortness of breath. Inhale 2 puffs every 6 hours as needed for breathing, Disp: , Rfl:  .  buPROPion (ZYBAN) 150 MG 12 hr tablet, Take 150 mg by mouth daily. 12hr, Disp: , Rfl:  .  cholecalciferol (VITAMIN D) 1000 units tablet, Take 2,000 Units by mouth daily., Disp: , Rfl:  .  clopidogrel (PLAVIX) 75 MG tablet, Take 75 mg by mouth daily., Disp: , Rfl:  .  diclofenac sodium (VOLTAREN) 1 % GEL, Apply 4 g topically 3 (three) times daily as needed., Disp: , Rfl:  .  doxazosin (CARDURA) 1 MG tablet, Take 1 mg by mouth daily., Disp: , Rfl:  .  finasteride (PROSCAR) 5 MG tablet, Take 5 mg by mouth daily., Disp: , Rfl:  .  gabapentin (NEURONTIN) 300 MG capsule, Take 300 mg by mouth 2 (two) times daily., Disp: , Rfl:  .  hydroxypropyl methylcellulose / hypromellose (ISOPTO TEARS / GONIOVISC) 2.5 % ophthalmic solution, Place 1-2 drops into both eyes 3 (three) times daily as needed for dry eyes., Disp: , Rfl:  .  isosorbide mononitrate (IMDUR) 30 MG 24 hr tablet, Take 30 mg by mouth daily., Disp: , Rfl:  .  lisinopril (PRINIVIL,ZESTRIL) 10 MG tablet, , Disp: , Rfl:  .  loratadine (CLARITIN) 10 MG tablet, Take 10 mg by mouth daily., Disp: , Rfl:  .  Lysine 1000 MG TABS, Take 1,000 mg by mouth 2 (two) times daily., Disp: , Rfl:  .  meclizine (ANTIVERT) 25 MG tablet, Take 25 mg by mouth 3 (three) times daily as needed  for dizziness or nausea. , Disp: , Rfl:  .  metFORMIN (GLUCOPHAGE-XR) 500 MG 24 hr tablet, , Disp: , Rfl:  .  nitroGLYCERIN (NITROSTAT) 0.4 MG SL tablet, Place 0.4 mg under the tongue every 5 (five) minutes as needed for chest pain., Disp: , Rfl:  .  Olodaterol HCl 2.5 MCG/ACT AERS, Inhale 2 puffs into the lungs daily., Disp: , Rfl:  .  OVER THE COUNTER MEDICATION, , Disp: , Rfl:  .  petrolatum-hydrophilic-aloe vera (ALOE VESTA) ointment, Apply 1 application topically 3 (three) times daily., Disp: , Rfl:  .  rosuvastatin (CRESTOR) 20 MG tablet, Take 20 mg by mouth daily., Disp: , Rfl:  .  sildenafil (VIAGRA) 100 MG tablet, Take 100 mg by mouth daily as needed for erectile dysfunction., Disp: , Rfl:  .  tamsulosin (FLOMAX) 0.4 MG CAPS capsule, Take 0.4 mg by mouth daily., Disp: , Rfl:    Allergies  Allergen Reactions  . Erythromycin Shortness Of Breath and Swelling  . Simvastatin     CAUSE MUSCLE PAIN,.     Objective: Physical Examination:  Vascular Examination: Capillary refill time immediate x 10 digits.  Palpable DP/PT pulses b/l.  Digital hair sparse b/l.  No edema noted b/l.  Skin temperature gradient WNL b/l.  Dermatological Examination: Skin with normal turgor, texture  and tone b/l.  No open wounds b/l.  No interdigital macerations noted b/l.  Elongated, thick, discolored brittle toenails with subungual debris and pain on dorsal palpation of nailbeds 1-5 b/l.  Musculoskeletal Examination: Muscle strength 5/5 to all muscle groups b/l  No pain, crepitus or joint discomfort with active/passive ROM.  Neurological Examination: Sensation absent b/l with 10 gram monofilament.  Vibratory sensation absent b/l.  Assessment: Mycotic nail infection with pain 1-5 b/l NIDDM with neuropathy  Plan: 1. Toenails 1-5 b/l were debrided in length and girth without iatrogenic laceration. 2.  Continue soft, supportive shoe gear daily. 3.  Report any pedal injuries to medical  professional. 4.  Follow up 3 months. 5.  Patient/POA to call should there be a question/concern in the interim.

## 2019-05-16 ENCOUNTER — Other Ambulatory Visit: Payer: Self-pay

## 2019-05-16 ENCOUNTER — Encounter: Payer: Self-pay | Admitting: Podiatry

## 2019-05-16 ENCOUNTER — Ambulatory Visit (INDEPENDENT_AMBULATORY_CARE_PROVIDER_SITE_OTHER): Payer: Medicare Other | Admitting: Podiatry

## 2019-05-16 DIAGNOSIS — M79676 Pain in unspecified toe(s): Secondary | ICD-10-CM

## 2019-05-16 DIAGNOSIS — B351 Tinea unguium: Secondary | ICD-10-CM | POA: Diagnosis not present

## 2019-05-16 DIAGNOSIS — E1142 Type 2 diabetes mellitus with diabetic polyneuropathy: Secondary | ICD-10-CM

## 2019-05-16 NOTE — Patient Instructions (Signed)

## 2019-05-22 NOTE — Progress Notes (Signed)
Subjective: [name] is seen today for follow up painful, elongated, thickened toenails 1-5 b/l feet that he cannot cut. Pain interferes with daily activities. Aggravating factor includes wearing enclosed shoe gear and relieved with periodic debridement.  He voices no new pedal concerns on today's visit.  Current Outpatient Medications on File Prior to Visit  Medication Sig  . acetaminophen (TYLENOL) 325 MG tablet Take 650 mg by mouth every 6 (six) hours as needed.  Marland Kitchen albuterol (PROVENTIL HFA;VENTOLIN HFA) 108 (90 BASE) MCG/ACT inhaler Inhale into the lungs every 6 (six) hours as needed for wheezing or shortness of breath. Inhale 2 puffs every 6 hours as needed for breathing  . buPROPion (ZYBAN) 150 MG 12 hr tablet Take 150 mg by mouth daily. 12hr  . cholecalciferol (VITAMIN D) 1000 units tablet Take 2,000 Units by mouth daily.  . clopidogrel (PLAVIX) 75 MG tablet Take 75 mg by mouth daily.  . diclofenac sodium (VOLTAREN) 1 % GEL Apply 4 g topically 3 (three) times daily as needed.  . doxazosin (CARDURA) 1 MG tablet Take 1 mg by mouth daily.  . finasteride (PROSCAR) 5 MG tablet Take 5 mg by mouth daily.  Marland Kitchen gabapentin (NEURONTIN) 300 MG capsule Take 300 mg by mouth 2 (two) times daily.  . hydroxypropyl methylcellulose / hypromellose (ISOPTO TEARS / GONIOVISC) 2.5 % ophthalmic solution Place 1-2 drops into both eyes 3 (three) times daily as needed for dry eyes.  . isosorbide mononitrate (IMDUR) 30 MG 24 hr tablet Take 30 mg by mouth daily.  Marland Kitchen lisinopril (PRINIVIL,ZESTRIL) 10 MG tablet   . loratadine (CLARITIN) 10 MG tablet Take 10 mg by mouth daily.  Marland Kitchen Lysine 1000 MG TABS Take 1,000 mg by mouth 2 (two) times daily.  . meclizine (ANTIVERT) 25 MG tablet Take 25 mg by mouth 3 (three) times daily as needed for dizziness or nausea.   . metFORMIN (GLUCOPHAGE-XR) 500 MG 24 hr tablet   . nitroGLYCERIN (NITROSTAT) 0.4 MG SL tablet Place 0.4 mg under the tongue every 5 (five) minutes as needed for  chest pain.  . Olodaterol HCl 2.5 MCG/ACT AERS Inhale 2 puffs into the lungs daily.  Marland Kitchen OVER THE COUNTER MEDICATION   . petrolatum-hydrophilic-aloe vera (ALOE VESTA) ointment Apply 1 application topically 3 (three) times daily.  . rosuvastatin (CRESTOR) 20 MG tablet Take 20 mg by mouth daily.  . sildenafil (VIAGRA) 100 MG tablet Take 100 mg by mouth daily as needed for erectile dysfunction.  . tamsulosin (FLOMAX) 0.4 MG CAPS capsule Take 0.4 mg by mouth daily.   No current facility-administered medications on file prior to visit.      Allergies  Allergen Reactions  . Erythromycin Shortness Of Breath and Swelling  . Simvastatin     CAUSE MUSCLE PAIN,.    Objective:  Vascular Examination: Capillary refill time immediate x 10 digits.  Dorsalis pedis present b/l.  Posterior tibial pulses present b/l.  Digital hair sparse b/l.  Skin temperature gradient WNL b/l.   Dermatological Examination: Skin with normal turgor, texture and tone b/l  Toenails 1-5 b/l discolored, thick, dystrophic with subungual debris and pain with palpation to nailbeds due to thickness of nails.  Musculoskeletal: Muscle strength 5/5 to all LE muscle groups  No pain, crepitus or joint limitation noted with ROM.   Neurological Examination: Protective sensation diminished b/l with 10 gram monofilament bilaterally.  Vibratory sensation diminished bilaterally.   Assessment: Painful onychomycosis toenails 1-5 b/l  NIDDM with neuropathy  Plan: 1. Toenails 1-5 b/l were  debrided in length and girth without iatrogenic bleeding. 2. Patient to continue soft, supportive shoe gear 3. Patient to report any pedal injuries to medical professional immediately. 4. Follow up 3 months.  5. Patient/POA to call should there be a concern in the interim.

## 2019-08-15 ENCOUNTER — Ambulatory Visit (INDEPENDENT_AMBULATORY_CARE_PROVIDER_SITE_OTHER): Payer: Medicare Other | Admitting: Podiatry

## 2019-08-15 ENCOUNTER — Other Ambulatory Visit: Payer: Self-pay

## 2019-08-15 ENCOUNTER — Encounter: Payer: Self-pay | Admitting: Podiatry

## 2019-08-15 DIAGNOSIS — E1142 Type 2 diabetes mellitus with diabetic polyneuropathy: Secondary | ICD-10-CM

## 2019-08-15 DIAGNOSIS — B351 Tinea unguium: Secondary | ICD-10-CM

## 2019-08-15 DIAGNOSIS — M79676 Pain in unspecified toe(s): Secondary | ICD-10-CM | POA: Diagnosis not present

## 2019-08-15 NOTE — Patient Instructions (Signed)

## 2019-08-20 NOTE — Progress Notes (Signed)
Subjective: [name] is a 73 y.o. y.o. male with h/o diabetes who presents today for preventative diabetic foot care. Patient has painful, elongated mycotic toenails which interfere with daily activities. Pain is aggravated when wearing enclosed shoe gear and relieved with periodic professional debridement.  [facility] is patient's PCP.   Medications reviewed in chart.  Allergies  Allergen Reactions  . Erythromycin Shortness Of Breath and Swelling  . Simvastatin     CAUSE MUSCLE PAIN,.    Objective: There were no vitals filed for this visit.  Vascular Examination: Capillary refill time immediate b/l.  Dorsalis pedis and posterior tibial pulses palpable b/l.  Digital hair sparse b/l.  Skin temperature gradient WNL b/l.  Dermatological Examination: Skin with normal turgor, texture and tone b/l.  Toenails 1-5 b/l discolored, thick, dystrophic with subungual debris and pain with palpation to nailbeds due to thickness of nails.  Musculoskeletal: Muscle strength 5/5 to all LE muscle groups b/l.  Neurological: Sensation diminished b/l with 10 gram monofilament.  Vibratory sensation diminished b/l.  Assessment: 1. Painful onychomycosis toenails 1-5 b/l 2    NIDDM with neuropathy  Plan: 1. Continue diabetic foot care principles. Literature dispensed on today. 2. Toenails 1-5 b/l were debrided in length and girth without iatrogenic bleeding. 3. Patient to continue soft, supportive shoe gear daily. 4. Patient to report any pedal injuries to medical professional immediately. 5. Follow up 3 months.  6. Patient/POA to call should there be a concern in the interim.

## 2019-11-14 ENCOUNTER — Ambulatory Visit (INDEPENDENT_AMBULATORY_CARE_PROVIDER_SITE_OTHER): Payer: Medicare PPO | Admitting: Podiatry

## 2019-11-14 ENCOUNTER — Encounter: Payer: Self-pay | Admitting: Podiatry

## 2019-11-14 ENCOUNTER — Other Ambulatory Visit: Payer: Self-pay

## 2019-11-14 DIAGNOSIS — M79676 Pain in unspecified toe(s): Secondary | ICD-10-CM

## 2019-11-14 DIAGNOSIS — M2011 Hallux valgus (acquired), right foot: Secondary | ICD-10-CM

## 2019-11-14 DIAGNOSIS — B351 Tinea unguium: Secondary | ICD-10-CM | POA: Diagnosis not present

## 2019-11-14 DIAGNOSIS — M2012 Hallux valgus (acquired), left foot: Secondary | ICD-10-CM

## 2019-11-14 DIAGNOSIS — E119 Type 2 diabetes mellitus without complications: Secondary | ICD-10-CM

## 2019-11-14 DIAGNOSIS — E1142 Type 2 diabetes mellitus with diabetic polyneuropathy: Secondary | ICD-10-CM | POA: Diagnosis not present

## 2019-11-14 NOTE — Patient Instructions (Signed)
Diabetes Mellitus and Foot Care Foot care is an important part of your health, especially when you have diabetes. Diabetes may cause you to have problems because of poor blood flow (circulation) to your feet and legs, which can cause your skin to:  Become thinner and drier.  Break more easily.  Heal more slowly.  Peel and crack. You may also have nerve damage (neuropathy) in your legs and feet, causing decreased feeling in them. This means that you may not notice minor injuries to your feet that could lead to more serious problems. Noticing and addressing any potential problems early is the best way to prevent future foot problems. How to care for your feet Foot hygiene  Wash your feet daily with warm water and mild soap. Do not use hot water. Then, pat your feet and the areas between your toes until they are completely dry. Do not soak your feet as this can dry your skin.  Trim your toenails straight across. Do not dig under them or around the cuticle. File the edges of your nails with an emery board or nail file.  Apply a moisturizing lotion or petroleum jelly to the skin on your feet and to dry, brittle toenails. Use lotion that does not contain alcohol and is unscented. Do not apply lotion between your toes. Shoes and socks  Wear clean socks or stockings every day. Make sure they are not too tight. Do not wear knee-high stockings since they may decrease blood flow to your legs.  Wear shoes that fit properly and have enough cushioning. Always look in your shoes before you put them on to be sure there are no objects inside.  To break in new shoes, wear them for just a few hours a day. This prevents injuries on your feet. Wounds, scrapes, corns, and calluses  Check your feet daily for blisters, cuts, bruises, sores, and redness. If you cannot see the bottom of your feet, use a mirror or ask someone for help.  Do not cut corns or calluses or try to remove them with medicine.  If you  find a minor scrape, cut, or break in the skin on your feet, keep it and the skin around it clean and dry. You may clean these areas with mild soap and water. Do not clean the area with peroxide, alcohol, or iodine.  If you have a wound, scrape, corn, or callus on your foot, look at it several times a day to make sure it is healing and not infected. Check for: ? Redness, swelling, or pain. ? Fluid or blood. ? Warmth. ? Pus or a bad smell. General instructions  Do not cross your legs. This may decrease blood flow to your feet.  Do not use heating pads or hot water bottles on your feet. They may burn your skin. If you have lost feeling in your feet or legs, you may not know this is happening until it is too late.  Protect your feet from hot and cold by wearing shoes, such as at the beach or on hot pavement.  Schedule a complete foot exam at least once a year (annually) or more often if you have foot problems. If you have foot problems, report any cuts, sores, or bruises to your health care provider immediately. Contact a health care provider if:  You have a medical condition that increases your risk of infection and you have any cuts, sores, or bruises on your feet.  You have an injury that is not   healing.  You have redness on your legs or feet.  You feel burning or tingling in your legs or feet.  You have pain or cramps in your legs and feet.  Your legs or feet are numb.  Your feet always feel cold.  You have pain around a toenail. Get help right away if:  You have a wound, scrape, corn, or callus on your foot and: ? You have pain, swelling, or redness that gets worse. ? You have fluid or blood coming from the wound, scrape, corn, or callus. ? Your wound, scrape, corn, or callus feels warm to the touch. ? You have pus or a bad smell coming from the wound, scrape, corn, or callus. ? You have a fever. ? You have a red line going up your leg. Summary  Check your feet every day  for cuts, sores, red spots, swelling, and blisters.  Moisturize feet and legs daily.  Wear shoes that fit properly and have enough cushioning.  If you have foot problems, report any cuts, sores, or bruises to your health care provider immediately.  Schedule a complete foot exam at least once a year (annually) or more often if you have foot problems. This information is not intended to replace advice given to you by your health care provider. Make sure you discuss any questions you have with your health care provider. Document Revised: 05/02/2019 Document Reviewed: 09/10/2016 Elsevier Patient Education  2020 [employer]  

## 2019-11-19 NOTE — Progress Notes (Signed)
Subjective: [name] presents today for preventative diabetic foot care, for diabetic foot evaluation and painful mycotic nails b/l that are difficult to trim. Pain interferes with ambulation. Aggravating factors include wearing enclosed shoe gear. Pain is relieved with periodic professional debridement.  Past Medical History:  Diagnosis Date  . Asthma   . Coronary artery disease   . Diabetes mellitus without complication ([place])   . History of kidney stones   . Hypertension   . Peripheral vascular disease [facility])     Patient Active Problem List   Diagnosis Date Noted  . Tobacco abuse 06/02/2017  . CAD (coronary artery disease) 07/12/2016  . Bradycardia 07/06/2016  . Chest pain with high risk for cardiac etiology 07/03/2016  . Hypokalemia 07/03/2016  . DM (diabetes mellitus) ([place]) 07/03/2016  . Essential hypertension 07/03/2016  . Gross hematuria 07/03/2016    Past Surgical History:  Procedure Laterality Date  . CARDIAC CATHETERIZATION N/A 07/05/2016   Procedure: Left Heart Cath and Coronary Angiography;  Surgeon: Sherren Mocha, MD;  Location: [facility];  Service: Cardiovascular;  Laterality: N/A;  . CORONARY ANGIOPLASTY    . heart stents  2006    Current Outpatient Medications on File Prior to Visit  Medication Sig Dispense Refill  . acetaminophen (TYLENOL) 325 MG tablet Take 650 mg by mouth every 6 (six) hours as needed.    Marland Kitchen albuterol (PROVENTIL HFA;VENTOLIN HFA) 108 (90 BASE) MCG/ACT inhaler Inhale into the lungs every 6 (six) hours as needed for wheezing or shortness of breath. Inhale 2 puffs every 6 hours as needed for breathing    . buPROPion (ZYBAN) 150 MG 12 hr tablet Take 150 mg by mouth daily. 12hr    . cholecalciferol (VITAMIN D) 1000 units tablet Take 2,000 Units by mouth daily.    . clopidogrel (PLAVIX) 75 MG tablet Take 75 mg by mouth daily.    . diclofenac sodium (VOLTAREN) 1 % GEL Apply 4 g topically 3 (three) times daily as needed.    . doxazosin  (CARDURA) 1 MG tablet Take 1 mg by mouth daily.    . finasteride (PROSCAR) 5 MG tablet Take 5 mg by mouth daily.    Marland Kitchen gabapentin (NEURONTIN) 300 MG capsule Take 300 mg by mouth 2 (two) times daily.    . hydroxypropyl methylcellulose / hypromellose (ISOPTO TEARS / GONIOVISC) 2.5 % ophthalmic solution Place 1-2 drops into both eyes 3 (three) times daily as needed for dry eyes.    . isosorbide mononitrate (IMDUR) 30 MG 24 hr tablet Take 30 mg by mouth daily.    Marland Kitchen lisinopril (PRINIVIL,ZESTRIL) 10 MG tablet     . loratadine (CLARITIN) 10 MG tablet Take 10 mg by mouth daily.    Marland Kitchen Lysine 1000 MG TABS Take 1,000 mg by mouth 2 (two) times daily.    . meclizine (ANTIVERT) 25 MG tablet Take 25 mg by mouth 3 (three) times daily as needed for dizziness or nausea.     . metFORMIN (GLUCOPHAGE-XR) 500 MG 24 hr tablet     . nitroGLYCERIN (NITROSTAT) 0.4 MG SL tablet Place 0.4 mg under the tongue every 5 (five) minutes as needed for chest pain.    . Olodaterol HCl 2.5 MCG/ACT AERS Inhale 2 puffs into the lungs daily.    Marland Kitchen OVER THE COUNTER MEDICATION     . petrolatum-hydrophilic-aloe vera (ALOE VESTA) ointment Apply 1 application topically 3 (three) times daily.    . rosuvastatin (CRESTOR) 20 MG tablet Take 20 mg by mouth daily.    Marland Kitchen  sildenafil (VIAGRA) 100 MG tablet Take 100 mg by mouth daily as needed for erectile dysfunction.    . tamsulosin (FLOMAX) 0.4 MG CAPS capsule Take 0.4 mg by mouth daily.     No current facility-administered medications on file prior to visit.     Allergies  Allergen Reactions  . Erythromycin Shortness Of Breath and Swelling  . Simvastatin     CAUSE MUSCLE PAIN,.    Social History   Occupational History  . Not on file  Tobacco Use  . Smoking status: Former Research scientist (life sciences)  . Smokeless tobacco: Never Used  Substance and Sexual Activity  . Alcohol use: No    Alcohol/week: 0.0 standard drinks  . Drug use: No  . Sexual activity: Not on file    Family History  Problem Relation  Age of Onset  . CAD Mother   . CAD Brother     There is no immunization history for the selected administration types on file for this patient.   Objective: There were no vitals filed for this visit.  [name] is a/an 74 y.o. male  in NAD. AAO X 3.  Vascular Examination: Capillary refill time to digits immediate b/l. Palpable DP pulses b/l. Palpable PT pulses b/l. Pedal hair sparse b/l. Skin temperature gradient within normal limits b/l.  Dermatological Examination: Pedal skin with normal turgor, texture and tone bilaterally. No open wounds bilaterally. No interdigital macerations bilaterally. Toenails 1-5 b/l elongated, dystrophic, thickened, crumbly with subungual debris and tenderness to dorsal palpation.  Musculoskeletal Examination: Normal muscle strength 5/5 to all lower extremity muscle groups bilaterally, no pain crepitus or joint limitation noted with ROM b/l and bunion deformity noted b/l  Neurological Examination: Protective sensation diminished with 10g monofilament b/l.  Assessment: 1. Pain due to onychomycosis of toenail   2. Hallux valgus, acquired, bilateral   3. Diabetic peripheral neuropathy associated with type 2 diabetes mellitus ([place])   4. Encounter for diabetic foot exam ([place])     Plan: -Diabetic foot examination performed on today's visit. -Continue diabetic foot care principles. Literature dispensed on today.  -Toenails 1-5 b/l were debrided in length and girth with sterile nail nippers and dremel without iatrogenic bleeding.  -Patient to continue soft, supportive shoe gear daily. -Patient to report any pedal injuries to medical professional immediately. -Patient/POA to call should there be question/concern in the interim.  Return in about 10 weeks (around 01/23/2020) for diabetic nail trim.

## 2019-12-12 NOTE — Progress Notes (Signed)
Cardiology Office Note   Date:  12/13/2019   ID:  [name], DOB 1945-10-25, MRN [id number]  PCP:  [place]    No chief complaint on file.  CAD  Wt Readings from Last 3 Encounters:  12/13/19 217 lb 6.4 oz (98.6 kg)  07/12/16 231 lb 12.8 oz (105.1 kg)  07/06/16 224 lb 8 oz (101.8 kg)       History of Present Illness: [name] is a 74 y.o. male  with a past medical history significant for CAD s/p PCI to the RCA in 2006, HTN, NIDDM, and smokingwho presents with chest pain. In 11/17,he developed a new left-sided chest discomfort, radiating to the left shoulder neck and arm. This was worsened with exertion, relieved with rest, and associated with nausea. He had an episode of gross hematuria and came to the hospital. He had a cath showing a patent stent and no further obstructive CAD.   He was treated for UTI with ABx, which was thought to be the cause of the hematuria. Aspirin and Pavix were resumed after negative CT urogram.    Denies : Chest pain. Dizziness. Leg edema. Nitroglycerin use. Orthopnea. Palpitations. Paroxysmal nocturnal dyspnea.  Syncope.   He had the first COVID vaccine.    He was diagnosed with left sided lung cancer in 09/2018.  He was offered an operation followed by chemo but he declined.    He has chronic SHOB from asthma, COPD and OSA- using CPAP.       Past Medical History:  Diagnosis Date  . Asthma   . Coronary artery disease   . Diabetes mellitus without complication ([place])   . History of kidney stones   . Hypertension   . Peripheral vascular disease [facility])     Past Surgical History:  Procedure Laterality Date  . CARDIAC CATHETERIZATION N/A 07/05/2016   Procedure: Left Heart Cath and Coronary Angiography;  Surgeon: Sherren Mocha, MD;  Location: [place] [facility];  Service: Cardiovascular;  Laterality: N/A;  . CORONARY ANGIOPLASTY    . heart stents  2006     Current Outpatient Medications  Medication Sig Dispense  Refill  . acetaminophen (TYLENOL) 325 MG tablet Take 650 mg by mouth every 6 (six) hours as needed.    Marland Kitchen albuterol (PROVENTIL HFA;VENTOLIN HFA) 108 (90 BASE) MCG/ACT inhaler Inhale into the lungs every 6 (six) hours as needed for wheezing or shortness of breath. Inhale 2 puffs every 6 hours as needed for breathing    . buPROPion (ZYBAN) 150 MG 12 hr tablet Take 150 mg by mouth daily. 12hr    . cholecalciferol (VITAMIN D) 1000 units tablet Take 2,000 Units by mouth daily.    . clopidogrel (PLAVIX) 75 MG tablet Take 75 mg by mouth daily.    . diclofenac sodium (VOLTAREN) 1 % GEL Apply 4 g topically 3 (three) times daily as needed.    . doxazosin (CARDURA) 1 MG tablet Take 1 mg by mouth daily.    . finasteride (PROSCAR) 5 MG tablet Take 5 mg by mouth daily.    Marland Kitchen gabapentin (NEURONTIN) 300 MG capsule Take 300 mg by mouth 2 (two) times daily.    . hydroxypropyl methylcellulose / hypromellose (ISOPTO TEARS / GONIOVISC) 2.5 % ophthalmic solution Place 1-2 drops into both eyes 3 (three) times daily as needed for dry eyes.    . isosorbide mononitrate (IMDUR) 30 MG 24 hr tablet Take 30 mg by mouth daily.    Marland Kitchen lisinopril (PRINIVIL,ZESTRIL) 10  MG tablet     . loratadine (CLARITIN) 10 MG tablet Take 10 mg by mouth daily.    Marland Kitchen Lysine 1000 MG TABS Take 1,000 mg by mouth 2 (two) times daily.    . meclizine (ANTIVERT) 25 MG tablet Take 25 mg by mouth 3 (three) times daily as needed for dizziness or nausea.     . metFORMIN (GLUCOPHAGE-XR) 500 MG 24 hr tablet     . nitroGLYCERIN (NITROSTAT) 0.4 MG SL tablet Place 0.4 mg under the tongue every 5 (five) minutes as needed for chest pain.    . Olodaterol HCl 2.5 MCG/ACT AERS Inhale 2 puffs into the lungs daily.    Marland Kitchen OVER THE COUNTER MEDICATION     . petrolatum-hydrophilic-aloe vera (ALOE VESTA) ointment Apply 1 application topically 3 (three) times daily.    . rosuvastatin (CRESTOR) 20 MG tablet Take 20 mg by mouth daily.    . sildenafil (VIAGRA) 100 MG tablet Take  100 mg by mouth daily as needed for erectile dysfunction.    . tamsulosin (FLOMAX) 0.4 MG CAPS capsule Take 0.4 mg by mouth daily.     No current facility-administered medications for this visit.    Allergies:   Erythromycin and Simvastatin    Social History:  The patient  reports that he has been smoking. He has never used smokeless tobacco. He reports that he does not drink alcohol or use drugs.   Family History:  The patient's family history includes CAD in his brother and mother.    ROS:  Please see the history of present illness.   Otherwise, review of systems are positive for chronic SHOB.   All other systems are reviewed and negative.    PHYSICAL EXAM: VS:  BP 108/78   Pulse 76   Ht 6\' 2"  (1.88 m)   Wt 217 lb 6.4 oz (98.6 kg)   SpO2 97%   BMI 27.91 kg/m  , BMI Body mass index is 27.91 kg/m. GEN: Well nourished, well developed, in no acute distress  HEENT: normal  Neck: no JVD, carotid bruits, or masses Cardiac: RRR; no murmurs, rubs, or gallops,no edema  Respiratory:  clear to auscultation bilaterally, normal work of breathing GI: soft, nontender, nondistended, + BS MS: no deformity or atrophy  Skin: warm and dry, no rash Neuro:  Strength and sensation are intact Psych: euthymic mood, full affect   EKG:   The ekg ordered today demonstrates NSR, no ST changes   Recent Labs: No results found for requested labs within last 8760 hours.   Lipid Panel    Component Value Date/Time   CHOL  01/22/2008 0410    102        ATP III CLASSIFICATION:  <200     mg/dL   Desirable  200-239  mg/dL   Borderline High  >=240    mg/dL   High   TRIG 49 01/22/2008 0410   HDL 32 (L) 01/22/2008 0410   CHOLHDL 3.2 01/22/2008 0410   VLDL 10 01/22/2008 0410   LDLCALC  01/22/2008 0410    60        Total Cholesterol/HDL:CHD Risk Coronary Heart Disease Risk Table                     Men   Women  1/2 Average Risk   3.4   3.3     Other studies Reviewed: Additional studies/  records that were reviewed today with results demonstrating: no old lab records available.  ASSESSMENT AND PLAN:  1. CAD: No angina.  COntinue aggressive secondary prevention.  He did not tolerate a statin, so now is off.  Lipids checked at the [place].  He does take the Plavix.   2. Tobacco abuse: Still smokes occasionally, 1/day approximately. 3. DM: per his report, A1C was 8.  4. HTN: The current medical regimen is effective;  continue present plan and medications. 5. Lung CA: He has declined treatment.  He is too concerned about the side effects.  I have no information about the malignancy that he has.  His wife wants him to take treatment.     Current medicines are reviewed at length with the patient today.  The patient concerns regarding his medicines were addressed.  The following changes have been made:  No change  Labs/ tests ordered today include:  No orders of the defined types were placed in this encounter.   Recommend 150 minutes/week of aerobic exercise Low fat, low carb, high fiber diet recommended  Disposition:   FU in 1 year [place] office number of Dr. Barbarann Ehlers   Signed, Larae Grooms, MD  12/13/2019 2:03 PM    [address], [place], [place]  [address] Phone: [phone]; Fax: [phone]

## 2019-12-13 ENCOUNTER — Ambulatory Visit: Payer: Medicare PPO | Admitting: Interventional Cardiology

## 2019-12-13 ENCOUNTER — Encounter: Payer: Self-pay | Admitting: Interventional Cardiology

## 2019-12-13 ENCOUNTER — Other Ambulatory Visit: Payer: Self-pay

## 2019-12-13 VITALS — BP 108/78 | HR 76 | Ht 74.0 in | Wt 217.4 lb

## 2019-12-13 DIAGNOSIS — Z720 Tobacco use: Secondary | ICD-10-CM | POA: Diagnosis not present

## 2019-12-13 DIAGNOSIS — E1159 Type 2 diabetes mellitus with other circulatory complications: Secondary | ICD-10-CM | POA: Diagnosis not present

## 2019-12-13 DIAGNOSIS — I25118 Atherosclerotic heart disease of native coronary artery with other forms of angina pectoris: Secondary | ICD-10-CM

## 2019-12-13 DIAGNOSIS — I10 Essential (primary) hypertension: Secondary | ICD-10-CM | POA: Diagnosis not present

## 2019-12-13 NOTE — Patient Instructions (Signed)
Medication Instructions:  Your physician recommends that you continue on your current medications as directed. Please refer to the Current Medication list given to you today.  *If you need a refill on your cardiac medications before your next appointment, please call your pharmacy*   Lab Work: None ordered  If you have labs (blood work) drawn today and your tests are completely normal, you will receive your results only by: Marland Kitchen MyChart Message (if you have MyChart) OR . A paper copy in the mail If you have any lab test that is abnormal or we need to change your treatment, we will call you to review the results.   Testing/Procedures: None ordered   Follow-Up: At [facility], you and your health needs are our priority.  As part of our continuing mission to provide you with exceptional heart care, we have created designated Provider Care Teams.  These Care Teams include your primary Cardiologist (physician) and Advanced Practice Providers (APPs -  Physician Assistants and Nurse Practitioners) who all work together to provide you with the care you need, when you need it.  We recommend signing up for the patient portal called "MyChart".  Sign up information is provided on this After Visit Summary.  MyChart is used to connect with patients for Virtual Visits (Telemedicine).  Patients are able to view lab/test results, encounter notes, upcoming appointments, etc.  Non-urgent messages can be sent to your provider as well.   To learn more about what you can do with MyChart, go to [email or web address].    Your next appointment:   12 month(s)  The format for your next appointment:   In Person  Provider:   You may see Larae Grooms, MD or one of the following Advanced Practice Providers on your designated Care Team:    Melina Copa, PA-C  Ermalinda Barrios, PA-C    Other Instructions  High-Fiber Diet Fiber, also called dietary fiber, is a type of carbohydrate that is found in fruits,  vegetables, whole grains, and beans. A high-fiber diet can have many health benefits. Your health care provider may recommend a high-fiber diet to help:  Prevent constipation. Fiber can make your bowel movements more regular.  Lower your cholesterol.  Relieve the following conditions: ? Swelling of veins in the anus (hemorrhoids). ? Swelling and irritation (inflammation) of specific areas of the digestive tract (uncomplicated diverticulosis). ? A problem of the large intestine (colon) that sometimes causes pain and diarrhea (irritable bowel syndrome, IBS).  Prevent overeating as part of a weight-loss plan.  Prevent heart disease, type 2 diabetes, and certain cancers. What is my plan? The recommended daily fiber intake in grams (g) includes:  38 g for men age 50 or younger.  30 g for men over age 23.  95 g for women age 68 or younger.  21 g for women over age 43. You can get the recommended daily intake of dietary fiber by:  Eating a variety of fruits, vegetables, grains, and beans.  Taking a fiber supplement, if it is not possible to get enough fiber through your diet. What do I need to know about a high-fiber diet?  It is better to get fiber through food sources rather than from fiber supplements. There is not a lot of research about how effective supplements are.  Always check the fiber content on the nutrition facts label of any prepackaged food. Look for foods that contain 5 g of fiber or more per serving.  Talk with a diet and nutrition specialist (  dietitian) if you have questions about specific foods that are recommended or not recommended for your medical condition, especially if those foods are not listed below.  Gradually increase how much fiber you consume. If you increase your intake of dietary fiber too quickly, you may have bloating, cramping, or gas.  Drink plenty of water. Water helps you to digest fiber. What are tips for following this plan?  Eat a wide  variety of high-fiber foods.  Make sure that half of the grains that you eat each day are whole grains.  Eat breads and cereals that are made with whole-grain flour instead of refined flour or white flour.  Eat brown rice, bulgur wheat, or millet instead of white rice.  Start the day with a breakfast that is high in fiber, such as a cereal that contains 5 g of fiber or more per serving.  Use beans in place of meat in soups, salads, and pasta dishes.  Eat high-fiber snacks, such as berries, raw vegetables, nuts, and popcorn.  Choose whole fruits and vegetables instead of processed forms like juice or sauce. What foods can I eat?  Fruits Berries. Pears. Apples. Oranges. Avocado. Prunes and raisins. Dried figs. Vegetables Sweet potatoes. Spinach. Kale. Artichokes. Cabbage. Broccoli. Cauliflower. Green peas. Carrots. Squash. Grains Whole-grain breads. Multigrain cereal. Oats and oatmeal. Brown rice. Barley. Bulgur wheat. [place]. Quinoa. Bran muffins. Popcorn. Rye wafer crackers. Meats and other proteins Navy, kidney, and pinto beans. Soybeans. Split peas. Lentils. Nuts and seeds. Dairy Fiber-fortified yogurt. Beverages Fiber-fortified soy milk. Fiber-fortified orange juice. Other foods Fiber bars. The items listed above may not be a complete list of recommended foods and beverages. Contact a dietitian for more options. What foods are not recommended? Fruits Fruit juice. Cooked, strained fruit. Vegetables Fried potatoes. Canned vegetables. Well-cooked vegetables. Grains White bread. Pasta made with refined flour. White rice. Meats and other proteins Fatty cuts of meat. Fried chicken or fried fish. Dairy Milk. Yogurt. Cream cheese. Sour cream. Fats and oils Butters. Beverages Soft drinks. Other foods Cakes and pastries. The items listed above may not be a complete list of foods and beverages to avoid. Contact a dietitian for more information. Summary  Fiber is a type of  carbohydrate. It is found in fruits, vegetables, whole grains, and beans.  There are many health benefits of eating a high-fiber diet, such as preventing constipation, lowering blood cholesterol, helping with weight loss, and reducing your risk of heart disease, diabetes, and certain cancers.  Gradually increase your intake of fiber. Increasing too fast can result in cramping, bloating, and gas. Drink plenty of water while you increase your fiber.  The best sources of fiber include whole fruits and vegetables, whole grains, nuts, seeds, and beans. This information is not intended to replace advice given to you by your health care provider. Make sure you discuss any questions you have with your health care provider. Document Revised: 06/13/2017 Document Reviewed: 06/13/2017 Elsevier Patient Education  2020 [employer].

## 2019-12-17 ENCOUNTER — Emergency Department (HOSPITAL_COMMUNITY)
Admission: EM | Admit: 2019-12-17 | Discharge: 2019-12-18 | Discharge status: Against Medical Advice | Payer: No Typology Code available for payment source | Attending: Emergency Medicine | Admitting: Emergency Medicine

## 2019-12-17 ENCOUNTER — Other Ambulatory Visit: Payer: Self-pay

## 2019-12-17 DIAGNOSIS — M1612 Unilateral primary osteoarthritis, left hip: Secondary | ICD-10-CM | POA: Diagnosis not present

## 2019-12-17 DIAGNOSIS — Z5321 Procedure and treatment not carried out due to patient leaving prior to being seen by health care provider: Secondary | ICD-10-CM | POA: Insufficient documentation

## 2019-12-17 DIAGNOSIS — R200 Anesthesia of skin: Secondary | ICD-10-CM | POA: Insufficient documentation

## 2019-12-17 DIAGNOSIS — M1712 Unilateral primary osteoarthritis, left knee: Secondary | ICD-10-CM | POA: Diagnosis not present

## 2019-12-17 LAB — COMPREHENSIVE METABOLIC PANEL
ALT: 12 U/L (ref 0–44)
AST: 19 U/L (ref 15–41)
Albumin: 3.7 g/dL (ref 3.5–5.0)
Alkaline Phosphatase: 55 U/L (ref 38–126)
Anion gap: 8 (ref 5–15)
BUN: 8 mg/dL (ref 8–23)
CO2: 24 mmol/L (ref 22–32)
Calcium: 9.3 mg/dL (ref 8.9–10.3)
Chloride: 108 mmol/L (ref 98–111)
Creatinine, Ser: 1 mg/dL (ref 0.61–1.24)
GFR calc Af Amer: >60 mL/min (ref >60)
GFR calc non Af Amer: >60 mL/min (ref >60)
Glucose, Bld: 128 mg/dL — ABNORMAL HIGH (ref 70–99)
Potassium: 4.2 mmol/L (ref 3.5–5.1)
Sodium: 140 mmol/L (ref 135–145)
Total Bilirubin: 0.8 mg/dL (ref 0.3–1.2)
Total Protein: 6.8 g/dL (ref 6.5–8.1)

## 2019-12-17 LAB — PROTIME-INR
INR: 1 (ref 0.8–1.2)
Prothrombin Time: 13 seconds (ref 11.4–15.2)

## 2019-12-17 LAB — I-STAT CHEM 8, ED
BUN: 10 mg/dL (ref 8–23)
Calcium, Ion: 1.2 mmol/L (ref 1.15–1.40)
Chloride: 108 mmol/L (ref 98–111)
Creatinine, Ser: 1 mg/dL (ref 0.61–1.24)
Glucose, Bld: 118 mg/dL — ABNORMAL HIGH (ref 70–99)
HCT: 44 % (ref 39.0–52.0)
Hemoglobin: 15 g/dL (ref 13.0–17.0)
Potassium: 4.2 mmol/L (ref 3.5–5.1)
Sodium: 141 mmol/L (ref 135–145)
TCO2: 29 mmol/L (ref 22–32)

## 2019-12-17 LAB — POCT I-STAT EG7
Acid-Base Excess: 1 mmol/L (ref 0.0–2.0)
Bicarbonate: 27.3 mmol/L (ref 20.0–28.0)
Calcium, Ion: 1.23 mmol/L (ref 1.15–1.40)
HCT: 44 % (ref 39.0–52.0)
Hemoglobin: 15 g/dL (ref 13.0–17.0)
O2 Saturation: 100 %
Potassium: 4.2 mmol/L (ref 3.5–5.1)
Sodium: 141 mmol/L (ref 135–145)
TCO2: 29 mmol/L (ref 22–32)
pCO2, Ven: 47.6 mmHg (ref 44.0–60.0)
pH, Ven: 7.366 (ref 7.250–7.430)
pO2, Ven: 182 mmHg — ABNORMAL HIGH (ref 32.0–45.0)

## 2019-12-17 LAB — DIFFERENTIAL
Abs Immature Granulocytes: 0.02 10*3/uL (ref 0.00–0.07)
Basophils Absolute: 0 10*3/uL (ref 0.0–0.1)
Basophils Relative: 0 %
Eosinophils Absolute: 0.1 10*3/uL (ref 0.0–0.5)
Eosinophils Relative: 1 %
Immature Granulocytes: 0 %
Lymphocytes Relative: 41 %
Lymphs Abs: 2.8 10*3/uL (ref 0.7–4.0)
Monocytes Absolute: 0.6 10*3/uL (ref 0.1–1.0)
Monocytes Relative: 9 %
Neutro Abs: 3.3 10*3/uL (ref 1.7–7.7)
Neutrophils Relative %: 49 %

## 2019-12-17 LAB — CBC
HCT: 44.7 % (ref 39.0–52.0)
Hemoglobin: 13.8 g/dL (ref 13.0–17.0)
MCH: 28.4 pg (ref 26.0–34.0)
MCHC: 30.9 g/dL (ref 30.0–36.0)
MCV: 92 fL (ref 80.0–100.0)
Platelets: 152 10*3/uL (ref 150–400)
RBC: 4.86 MIL/uL (ref 4.22–5.81)
RDW: 13.1 % (ref 11.5–15.5)
WBC: 6.8 10*3/uL (ref 4.0–10.5)
nRBC: 0 % (ref 0.0–0.2)

## 2019-12-17 LAB — CBG MONITORING, ED
Glucose-Capillary: 124 mg/dL — ABNORMAL HIGH (ref 70–99)
Glucose-Capillary: 110 mg/dL — ABNORMAL HIGH (ref 70–99)

## 2019-12-17 LAB — APTT: aPTT: 27 seconds (ref 24–36)

## 2019-12-17 MED ORDER — SODIUM CHLORIDE 0.9% FLUSH: 3.0000 mL | Freq: Once | INTRAVENOUS | Status: DC

## 2019-12-17 NOTE — ED Triage Notes (Addendum)
Pt here for evaluation of L leg numbness x 2 days. Pt sts he has chronic pain in his L knee and then it progressed to numbness. Denies new injury. No other neuro deficits in triage. C/o pain from L hip to L knee.

## 2019-12-18 ENCOUNTER — Encounter (HOSPITAL_COMMUNITY): Payer: Self-pay | Admitting: Emergency Medicine

## 2019-12-18 ENCOUNTER — Emergency Department (HOSPITAL_COMMUNITY)
Admission: EM | Admit: 2019-12-18 | Discharge: 2019-12-18 | Disposition: A | Discharge status: Home / Self Care | Payer: No Typology Code available for payment source | Source: Home / Self Care | Attending: Emergency Medicine | Admitting: Emergency Medicine

## 2019-12-18 ENCOUNTER — Emergency Department (HOSPITAL_COMMUNITY): Payer: No Typology Code available for payment source

## 2019-12-18 ENCOUNTER — Other Ambulatory Visit: Payer: Self-pay

## 2019-12-18 DIAGNOSIS — M1712 Unilateral primary osteoarthritis, left knee: Secondary | ICD-10-CM

## 2019-12-18 DIAGNOSIS — M160 Bilateral primary osteoarthritis of hip: Secondary | ICD-10-CM

## 2019-12-18 DIAGNOSIS — M1612 Unilateral primary osteoarthritis, left hip: Secondary | ICD-10-CM | POA: Diagnosis not present

## 2019-12-18 LAB — CBG MONITORING, ED: Glucose-Capillary: 123 mg/dL — ABNORMAL HIGH (ref 70–99)

## 2019-12-18 MED ORDER — HYDROCODONE-ACETAMINOPHEN 5-325 MG PO TABS
1.0000 | ORAL_TABLET | Freq: Once | ORAL | Status: AC
  Administered 2019-12-18: 1 via ORAL
  Filled 2019-12-18: qty 1

## 2019-12-18 MED ORDER — HYDROCODONE-ACETAMINOPHEN 5-325 MG PO TABS: 1.0000 | ORAL_TABLET | ORAL | 0 refills | Status: DC | PRN

## 2019-12-18 NOTE — ED Triage Notes (Signed)
Patient complaining of left leg pain. Patient states he did not injure it. Patient states that it started hurting two days ago.

## 2019-12-18 NOTE — ED Notes (Signed)
Pt advised against leaving. Pt left waiting room

## 2019-12-18 NOTE — Discharge Instructions (Addendum)
Use heat on the sore areas 3 or 4 times a day.  Try taking ibuprofen or acetaminophen for pain.  Use the narcotic pain reliever, if needed.  Call your [facility], for an appointment to be seen by an orthopedic doctor for ongoing care on the arthritis condition.

## 2019-12-18 NOTE — ED Provider Notes (Signed)
[facility] DEPT Provider Note   CSN: [id number] Arrival date & time: 12/18/19  1559     History Chief Complaint  Patient presents with  . Leg Pain    [name] is a 74 y.o. male.  HPI Patient has sensation of left leg numbness for 2 days and describes chronic left neck pain.  He also has pain in his left hip.  No recent trauma.  No ongoing back pain.  He denies fever, chills, cough, shortness of breath, weakness or dizziness.  There are no other known modifying factors.    Past Medical History:  Diagnosis Date  . Asthma   . Coronary artery disease   . Diabetes mellitus without complication ([place])   . History of kidney stones   . Hypertension   . Peripheral vascular disease [facility])     Patient Active Problem List   Diagnosis Date Noted  . Tobacco abuse 06/02/2017  . CAD (coronary artery disease) 07/12/2016  . Bradycardia 07/06/2016  . Chest pain with high risk for cardiac etiology 07/03/2016  . Hypokalemia 07/03/2016  . DM (diabetes mellitus) ([place]) 07/03/2016  . Essential hypertension 07/03/2016  . Gross hematuria 07/03/2016    Past Surgical History:  Procedure Laterality Date  . CARDIAC CATHETERIZATION N/A 07/05/2016   Procedure: Left Heart Cath and Coronary Angiography;  Surgeon: Sherren Mocha, MD;  Location: [facility];  Service: Cardiovascular;  Laterality: N/A;  . CORONARY ANGIOPLASTY    . heart stents  2006       Family History  Problem Relation Age of Onset  . CAD Mother   . CAD Brother     Social History   Tobacco Use  . Smoking status: Light Tobacco Smoker  . Smokeless tobacco: Never Used  Substance Use Topics  . Alcohol use: No    Alcohol/week: 0.0 standard drinks  . Drug use: No    Home Medications Prior to Admission medications   Medication Sig Start Date End Date Taking? Authorizing Provider  acetaminophen (TYLENOL) 325 MG tablet Take 650 mg by mouth every 6 (six) hours as needed.    [provider]  albuterol (PROVENTIL HFA;VENTOLIN HFA) 108 (90 BASE) MCG/ACT inhaler Inhale into the lungs every 6 (six) hours as needed for wheezing or shortness of breath. Inhale 2 puffs every 6 hours as needed for breathing    [provider]  buPROPion (ZYBAN) 150 MG 12 hr tablet Take 150 mg by mouth daily. 12hr    [provider]  cholecalciferol (VITAMIN D) 1000 units tablet Take 2,000 Units by mouth daily.    [provider]  clopidogrel (PLAVIX) 75 MG tablet Take 75 mg by mouth daily.    [provider]  diclofenac sodium (VOLTAREN) 1 % GEL Apply 4 g topically 3 (three) times daily as needed.    [provider]  doxazosin (CARDURA) 1 MG tablet Take 1 mg by mouth daily.    [provider]  finasteride (PROSCAR) 5 MG tablet Take 5 mg by mouth daily.    [provider]  gabapentin (NEURONTIN) 300 MG capsule Take 300 mg by mouth 2 (two) times daily.    [provider]  HYDROcodone-acetaminophen (NORCO/VICODIN) 5-325 MG tablet Take 1 tablet by mouth every 4 (four) hours as needed for moderate pain. 12/18/19   Daleen Bo, MD  hydroxypropyl methylcellulose / hypromellose (ISOPTO TEARS / GONIOVISC) 2.5 % ophthalmic solution Place 1-2 drops into both eyes 3 (three) times daily as needed  for dry eyes.    [provider]  isosorbide mononitrate (IMDUR) 30 MG 24 hr tablet Take 30 mg by mouth daily.    [provider]  lisinopril (PRINIVIL,ZESTRIL) 10 MG tablet  09/27/18   [provider]  loratadine (CLARITIN) 10 MG tablet Take 10 mg by mouth daily.    [provider]  Lysine 1000 MG TABS Take 1,000 mg by mouth 2 (two) times daily.    [provider]  meclizine (ANTIVERT) 25 MG tablet Take 25 mg by mouth 3 (three) times daily as needed for dizziness or nausea.     [provider]  metFORMIN (GLUCOPHAGE-XR) 500 MG 24 hr tablet  10/17/18   [provider]  nitroGLYCERIN  (NITROSTAT) 0.4 MG SL tablet Place 0.4 mg under the tongue every 5 (five) minutes as needed for chest pain.    [provider]  Olodaterol HCl 2.5 MCG/ACT AERS Inhale 2 puffs into the lungs daily.    [provider]  OVER THE COUNTER MEDICATION     [provider]  petrolatum-hydrophilic-aloe vera (ALOE VESTA) ointment Apply 1 application topically 3 (three) times daily.    [provider]  rosuvastatin (CRESTOR) 20 MG tablet Take 20 mg by mouth daily.    [provider]  sildenafil (VIAGRA) 100 MG tablet Take 100 mg by mouth daily as needed for erectile dysfunction.    [provider]  tamsulosin (FLOMAX) 0.4 MG CAPS capsule Take 0.4 mg by mouth daily.    [provider]    Allergies    Erythromycin and Simvastatin  Review of Systems   Review of Systems  All other systems reviewed and are negative.   Physical Exam Updated Vital Signs BP 119/76 (BP Location: Left Arm)   Pulse 65   Temp 97.8 F (36.6 C) (Oral)   Resp 16   Ht 6\' 2"  (1.88 m)   Wt 97.1 kg   SpO2 95%   BMI 27.48 kg/m   Physical Exam Vitals and nursing note reviewed.  Constitutional:      Appearance: He is well-developed.  HENT:     Head: Normocephalic and atraumatic.     Right Ear: External ear normal.     Left Ear: External ear normal.  Eyes:     Conjunctiva/sclera: Conjunctivae normal.     Pupils: Pupils are equal, round, and reactive to light.  Neck:     Trachea: Phonation normal.  Cardiovascular:     Rate and Rhythm: Normal rate.  Pulmonary:     Effort: Pulmonary effort is normal.  Abdominal:     General: There is no distension.  Musculoskeletal:     Cervical back: Normal range of motion and neck supple.     Comments: He guards against movement of the left hip secondary to pain.  There is mild tenderness of the upper left leg near the hip.  Left knee is tender without deformity or swelling.  Normal range of motion of the left knee.    Skin:    General: Skin is warm and dry.  Neurological:     Mental Status: He is alert and oriented to person, place, and time.     Cranial Nerves: No cranial nerve deficit.     Sensory: No sensory deficit.     Motor: No abnormal muscle tone.     Coordination: Coordination normal.  Psychiatric:        Mood and Affect: Mood normal.  Behavior: Behavior normal.        Thought Content: Thought content normal.        Judgment: Judgment normal.     ED Results / Procedures / Treatments   Labs (all labs ordered are listed, but only abnormal results are displayed) Labs Reviewed - No data to display  EKG None  Radiology DG Knee Complete 4 Views Left  Result Date: 12/18/2019 CLINICAL DATA:  Left hip and knee pain. Pain for years, worse over the last 3 days when walking up stairs. EXAM: LEFT KNEE - COMPLETE 4+ VIEW COMPARISON:  None. FINDINGS: No evidence of fracture, dislocation, or joint effusion. Mild tricompartmental peripheral spurring. Degenerative subchondral cystic change in the patella. No erosion, bony destruction, or evidence of focal lesion. Soft tissues are unremarkable. IMPRESSION: Mild tricompartmental osteoarthritis. Electronically Signed   By: Keith Rake M.D.   On: 12/18/2019 18:26   DG Hip Unilat With Pelvis 2-3 Views Left  Result Date: 12/18/2019 CLINICAL DATA:  Left hip and knee pain. Pain for years, worse over the last 3 days when walking up stairs. EXAM: DG HIP (WITH OR WITHOUT PELVIS) 2-3V LEFT COMPARISON:  None. FINDINGS: The cortical margins of the bony pelvis are intact. No fracture. Pubic symphysis and sacroiliac joints are congruent. Moderate bilateral sacroiliac joint degenerative change. Moderate bilateral hip osteoarthritis, left greater than right with joint space narrowing and peripheral spurring. No evidence of avascular necrosis, erosion, or focal bone lesion. Soft tissues are unremarkable. IMPRESSION: Osteoarthritis of both hips, left greater than  right. Degenerative change of both sacroiliac joints. Electronically Signed   By: Keith Rake M.D.   On: 12/18/2019 18:26    Procedures Procedures (including critical care time)  Medications Ordered in ED Medications - No data to display  ED Course  I have reviewed the triage vital signs and the nursing notes.  Pertinent labs & imaging results that were available during my care of the patient were reviewed by me and considered in my medical decision making (see chart for details).    MDM Rules/Calculators/A&P                       Patient Vitals for the past 24 hrs:  BP Temp Temp src Pulse Resp SpO2 Height Weight  12/18/19 1607 -- -- -- -- -- -- 6\' 2"  (1.88 m) 97.1 kg  12/18/19 1606 119/76 97.8 F (36.6 C) Oral 65 16 95 % -- --    8:06 PM Reevaluation with update and discussion. After initial assessment and treatment, an updated evaluation reveals no change in status, findings discussed and questions answered. Daleen Bo   Medical Decision Making:  This patient is presenting for evaluation of musculoskeletal pain, which Face-to-face evaluation   History:   Physical exam:  Medical screening examination/treatment/procedure(s) were conducted as a shared visit with non-physician practitioner(s) and myself.  I personally evaluated the patient during the encounter require a range of treatment options, and he is a complaint that involves a moderate risk of morbidity and mortality. The differential diagnoses include spinal myelopathy, lumbar radiculopathy, arthritis. I decided  to review old records, and in summary healthy elderly man with history of diabetes and hypertension.  He has been walking with a cane secondary to leg pain.. I did not additional historical information from anybody. Clinical Laboratory Tests Ordered, included none. Radiologic Tests Ordered, included pelvis, left hip, left knee. I independently Visualized: Radiographic images, which show mild  degenerative changes consistent with source of  pain  Critical Interventions-patient stable without change in status, after clinical observation and treatment  After These Interventions, the Patient was reevaluated and was found stable for discharge.  Suspect pain related to arthritis, without evidence for lumbar radiculopathy or myelopathy  CRITICAL CARE-no Performed by: Daleen Bo  Nursing Notes Reviewed/ Care Coordinated Applicable Imaging Reviewed Interpretation of Laboratory Data incorporated into ED treatment  The patient appears reasonably screened and/or stabilized for discharge and I doubt any other medical condition or other [facility] requiring further screening, evaluation, or treatment in the ED at this time prior to discharge.  Plan: Home Medications-continue usual medicatio; Home Treatments-heat to affected area; return here if the recommended treatment, does not improve the symptoms; Recommended follow up-PCP or orthopedic follow-up as needed   Final Clinical Impression(s) / ED Diagnoses Final diagnoses:  Osteoarthritis of both hips, unspecified osteoarthritis type  Osteoarthritis of left knee, unspecified osteoarthritis type    Rx / DC Orders ED Discharge Orders         Ordered    HYDROcodone-acetaminophen (NORCO/VICODIN) 5-325 MG tablet  Every 4 hours PRN     12/18/19 2005           Daleen Bo, MD 12/19/19 1319

## 2020-01-14 ENCOUNTER — Inpatient Hospital Stay (HOSPITAL_COMMUNITY)
Admission: EM | Admit: 2020-01-14 | Discharge: 2020-01-22 | DRG: 269 | Disposition: A | Discharge status: Skilled Nursing Facility | Payer: No Typology Code available for payment source | Attending: Vascular Surgery | Admitting: Vascular Surgery

## 2020-01-14 ENCOUNTER — Encounter (HOSPITAL_COMMUNITY): Payer: Self-pay

## 2020-01-14 ENCOUNTER — Other Ambulatory Visit (HOSPITAL_COMMUNITY): Payer: Self-pay | Admitting: Family Medicine

## 2020-01-14 ENCOUNTER — Emergency Department (HOSPITAL_COMMUNITY): Payer: No Typology Code available for payment source

## 2020-01-14 ENCOUNTER — Ambulatory Visit (HOSPITAL_COMMUNITY)
Admission: EM | Admit: 2020-01-14 | Discharge: 2020-01-14 | Disposition: A | Discharge status: Home / Self Care | Payer: Medicare PPO

## 2020-01-14 ENCOUNTER — Other Ambulatory Visit: Payer: Self-pay

## 2020-01-14 ENCOUNTER — Ambulatory Visit (HOSPITAL_COMMUNITY)
Admission: RE | Admit: 2020-01-14 | Discharge: 2020-01-14 | Disposition: A | Discharge status: Home / Self Care | Payer: Medicare PPO | Source: Ambulatory Visit | Attending: Family Medicine | Admitting: Family Medicine

## 2020-01-14 ENCOUNTER — Encounter (HOSPITAL_COMMUNITY): Payer: Self-pay | Admitting: *Deleted

## 2020-01-14 DIAGNOSIS — Z8679 Personal history of other diseases of the circulatory system: Secondary | ICD-10-CM

## 2020-01-14 DIAGNOSIS — I2510 Atherosclerotic heart disease of native coronary artery without angina pectoris: Secondary | ICD-10-CM | POA: Diagnosis present

## 2020-01-14 DIAGNOSIS — M79605 Pain in left leg: Secondary | ICD-10-CM | POA: Diagnosis not present

## 2020-01-14 DIAGNOSIS — I959 Hypotension, unspecified: Secondary | ICD-10-CM | POA: Diagnosis not present

## 2020-01-14 DIAGNOSIS — M7989 Other specified soft tissue disorders: Secondary | ICD-10-CM | POA: Diagnosis not present

## 2020-01-14 DIAGNOSIS — Z87442 Personal history of urinary calculi: Secondary | ICD-10-CM

## 2020-01-14 DIAGNOSIS — Z888 Allergy status to other drugs, medicaments and biological substances status: Secondary | ICD-10-CM

## 2020-01-14 DIAGNOSIS — F17200 Nicotine dependence, unspecified, uncomplicated: Secondary | ICD-10-CM | POA: Diagnosis present

## 2020-01-14 DIAGNOSIS — I745 Embolism and thrombosis of iliac artery: Secondary | ICD-10-CM | POA: Diagnosis present

## 2020-01-14 DIAGNOSIS — E1151 Type 2 diabetes mellitus with diabetic peripheral angiopathy without gangrene: Secondary | ICD-10-CM | POA: Diagnosis present

## 2020-01-14 DIAGNOSIS — M79672 Pain in left foot: Secondary | ICD-10-CM

## 2020-01-14 DIAGNOSIS — E119 Type 2 diabetes mellitus without complications: Secondary | ICD-10-CM

## 2020-01-14 DIAGNOSIS — I714 Abdominal aortic aneurysm, without rupture, unspecified: Secondary | ICD-10-CM | POA: Diagnosis present

## 2020-01-14 DIAGNOSIS — Z01810 Encounter for preprocedural cardiovascular examination: Secondary | ICD-10-CM

## 2020-01-14 DIAGNOSIS — Z8249 Family history of ischemic heart disease and other diseases of the circulatory system: Secondary | ICD-10-CM

## 2020-01-14 DIAGNOSIS — D649 Anemia, unspecified: Secondary | ICD-10-CM | POA: Diagnosis not present

## 2020-01-14 DIAGNOSIS — E876 Hypokalemia: Secondary | ICD-10-CM | POA: Diagnosis not present

## 2020-01-14 DIAGNOSIS — Z79899 Other long term (current) drug therapy: Secondary | ICD-10-CM

## 2020-01-14 DIAGNOSIS — Z881 Allergy status to other antibiotic agents status: Secondary | ICD-10-CM

## 2020-01-14 DIAGNOSIS — Z7902 Long term (current) use of antithrombotics/antiplatelets: Secondary | ICD-10-CM

## 2020-01-14 DIAGNOSIS — R1032 Left lower quadrant pain: Secondary | ICD-10-CM | POA: Diagnosis not present

## 2020-01-14 DIAGNOSIS — I998 Other disorder of circulatory system: Secondary | ICD-10-CM | POA: Diagnosis present

## 2020-01-14 DIAGNOSIS — J449 Chronic obstructive pulmonary disease, unspecified: Secondary | ICD-10-CM | POA: Diagnosis present

## 2020-01-14 DIAGNOSIS — Z955 Presence of coronary angioplasty implant and graft: Secondary | ICD-10-CM

## 2020-01-14 DIAGNOSIS — Z20822 Contact with and (suspected) exposure to covid-19: Secondary | ICD-10-CM | POA: Diagnosis present

## 2020-01-14 DIAGNOSIS — I739 Peripheral vascular disease, unspecified: Secondary | ICD-10-CM

## 2020-01-14 DIAGNOSIS — I480 Paroxysmal atrial fibrillation: Secondary | ICD-10-CM | POA: Diagnosis not present

## 2020-01-14 DIAGNOSIS — G4733 Obstructive sleep apnea (adult) (pediatric): Secondary | ICD-10-CM | POA: Diagnosis present

## 2020-01-14 DIAGNOSIS — Z7984 Long term (current) use of oral hypoglycemic drugs: Secondary | ICD-10-CM

## 2020-01-14 DIAGNOSIS — I10 Essential (primary) hypertension: Secondary | ICD-10-CM | POA: Diagnosis present

## 2020-01-14 DIAGNOSIS — M1990 Unspecified osteoarthritis, unspecified site: Secondary | ICD-10-CM | POA: Diagnosis present

## 2020-01-14 LAB — CBC
HCT: 45 % (ref 39.0–52.0)
Hemoglobin: 14.2 g/dL (ref 13.0–17.0)
MCH: 28.3 pg (ref 26.0–34.0)
MCHC: 31.6 g/dL (ref 30.0–36.0)
MCV: 89.8 fL (ref 80.0–100.0)
Platelets: 187 10*3/uL (ref 150–400)
RBC: 5.01 MIL/uL (ref 4.22–5.81)
RDW: 12.4 % (ref 11.5–15.5)
WBC: 7.8 10*3/uL (ref 4.0–10.5)
nRBC: 0 % (ref 0.0–0.2)

## 2020-01-14 LAB — I-STAT CHEM 8, ED
BUN: 18 mg/dL (ref 8–23)
Calcium, Ion: 1.29 mmol/L (ref 1.15–1.40)
Chloride: 101 mmol/L (ref 98–111)
Creatinine, Ser: 1.2 mg/dL (ref 0.61–1.24)
Glucose, Bld: 142 mg/dL — ABNORMAL HIGH (ref 70–99)
HCT: 45 % (ref 39.0–52.0)
Hemoglobin: 15.3 g/dL (ref 13.0–17.0)
Potassium: 3.9 mmol/L (ref 3.5–5.1)
Sodium: 137 mmol/L (ref 135–145)
TCO2: 29 mmol/L (ref 22–32)

## 2020-01-14 LAB — BASIC METABOLIC PANEL
Anion gap: 11 (ref 5–15)
BUN: 19 mg/dL (ref 8–23)
CO2: 25 mmol/L (ref 22–32)
Calcium: 9.9 mg/dL (ref 8.9–10.3)
Chloride: 100 mmol/L (ref 98–111)
Creatinine, Ser: 1.09 mg/dL (ref 0.61–1.24)
GFR calc Af Amer: >60 mL/min (ref >60)
GFR calc non Af Amer: >60 mL/min (ref >60)
Glucose, Bld: 145 mg/dL — ABNORMAL HIGH (ref 70–99)
Potassium: 4.3 mmol/L (ref 3.5–5.1)
Sodium: 136 mmol/L (ref 135–145)

## 2020-01-14 LAB — PROTIME-INR
INR: 1 (ref 0.8–1.2)
Prothrombin Time: 12.7 seconds (ref 11.4–15.2)

## 2020-01-14 MED ORDER — HEPARIN (PORCINE) 25000 UT/250ML-% IV SOLN
1600.0000 [IU]/h | INTRAVENOUS | Status: DC
  Administered 2020-01-14: 1600 [IU]/h via INTRAVENOUS
  Filled 2020-01-14: qty 250

## 2020-01-14 MED ORDER — HEPARIN BOLUS VIA INFUSION
5500.0000 [IU] | Freq: Once | INTRAVENOUS | Status: AC
  Administered 2020-01-14: 5500 [IU] via INTRAVENOUS
  Filled 2020-01-14: qty 5500

## 2020-01-14 MED ORDER — IOHEXOL 350 MG/ML SOLN
80.0000 mL | Freq: Once | INTRAVENOUS | Status: AC | PRN
  Administered 2020-01-14: 80 mL via INTRAVENOUS

## 2020-01-14 MED ORDER — MORPHINE SULFATE (PF) 4 MG/ML IV SOLN
4.0000 mg | Freq: Once | INTRAVENOUS | Status: AC
  Administered 2020-01-14: 4 mg via INTRAVENOUS
  Filled 2020-01-14: qty 1

## 2020-01-14 MED ORDER — ALBUTEROL SULFATE HFA 108 (90 BASE) MCG/ACT IN AERS
2.0000 | INHALATION_SPRAY | Freq: Once | RESPIRATORY_TRACT | Status: AC
  Administered 2020-01-15: 2 via RESPIRATORY_TRACT
  Filled 2020-01-14: qty 6.7

## 2020-01-14 NOTE — ED Provider Notes (Signed)
[place]   [id number] 01/14/20 Arrival Time: 1423  [id number] PAIN  SUBJECTIVE: History from: patient. [name] is a 74 y.o. male complains of  Left foot pain that began about 2 months ago, but has worsened since this morning.  Denies a precipitating event or specific injury.  Localizes the pain to the ankle of the L leg. Describes the pain as constant and achy in character. Has made no attempts to treat at home. Symptoms are made worse with walking. Noticed that his foot was cold earlier today, and is experiencing numbness and tingling in the toes of the L foot.  Denies fever, chills, erythema, ecchymosis, effusion, weakness, numbness and tingling, saddle paresthesias, loss of bowel or bladder function.      ROS: As per HPI.  All other pertinent ROS negative.     Past Medical History:  Diagnosis Date  . Asthma   . Coronary artery disease   . Diabetes mellitus without complication ([place])   . History of kidney stones   . Hypertension   . Peripheral vascular disease [facility])    Past Surgical History:  Procedure Laterality Date  . CARDIAC CATHETERIZATION N/A 07/05/2016   Procedure: Left Heart Cath and Coronary Angiography;  Surgeon: Sherren Mocha, MD;  Location: [facility];  Service: Cardiovascular;  Laterality: N/A;  . CORONARY ANGIOPLASTY    . heart stents  2006   Allergies  Allergen Reactions  . Erythromycin Shortness Of Breath and Swelling  . Simvastatin     CAUSE MUSCLE PAIN,.   No current facility-administered medications on file prior to encounter.   Current Outpatient Medications on File Prior to Encounter  Medication Sig Dispense Refill  . acetaminophen (TYLENOL) 325 MG tablet Take 650 mg by mouth every 6 (six) hours as needed.    Marland Kitchen albuterol (PROVENTIL HFA;VENTOLIN HFA) 108 (90 BASE) MCG/ACT inhaler Inhale into the lungs every 6 (six) hours as needed for wheezing or shortness of breath. Inhale 2 puffs every 6 hours as needed for breathing    .  buPROPion (ZYBAN) 150 MG 12 hr tablet Take 150 mg by mouth daily. 12hr    . clopidogrel (PLAVIX) 75 MG tablet Take 75 mg by mouth daily.    Marland Kitchen doxazosin (CARDURA) 1 MG tablet Take 1 mg by mouth daily.    . finasteride (PROSCAR) 5 MG tablet Take 5 mg by mouth daily.    Marland Kitchen gabapentin (NEURONTIN) 300 MG capsule Take 300 mg by mouth 2 (two) times daily.    . isosorbide mononitrate (IMDUR) 30 MG 24 hr tablet Take 30 mg by mouth daily.    Marland Kitchen lisinopril (PRINIVIL,ZESTRIL) 10 MG tablet     . metFORMIN (GLUCOPHAGE-XR) 500 MG 24 hr tablet     . cholecalciferol (VITAMIN D) 1000 units tablet Take 2,000 Units by mouth daily.    . diclofenac sodium (VOLTAREN) 1 % GEL Apply 4 g topically 3 (three) times daily as needed.    Marland Kitchen HYDROcodone-acetaminophen (NORCO/VICODIN) 5-325 MG tablet Take 1 tablet by mouth every 4 (four) hours as needed for moderate pain. 20 tablet 0  . hydroxypropyl methylcellulose / hypromellose (ISOPTO TEARS / GONIOVISC) 2.5 % ophthalmic solution Place 1-2 drops into both eyes 3 (three) times daily as needed for dry eyes.    Marland Kitchen loratadine (CLARITIN) 10 MG tablet Take 10 mg by mouth daily.    Marland Kitchen Lysine 1000 MG TABS Take 1,000 mg by mouth 2 (two) times daily.    . meclizine (ANTIVERT) 25 MG tablet  Take 25 mg by mouth 3 (three) times daily as needed for dizziness or nausea.     . nitroGLYCERIN (NITROSTAT) 0.4 MG SL tablet Place 0.4 mg under the tongue every 5 (five) minutes as needed for chest pain.    . Olodaterol HCl 2.5 MCG/ACT AERS Inhale 2 puffs into the lungs daily.    Marland Kitchen OVER THE COUNTER MEDICATION     . petrolatum-hydrophilic-aloe vera (ALOE VESTA) ointment Apply 1 application topically 3 (three) times daily.    . rosuvastatin (CRESTOR) 20 MG tablet Take 20 mg by mouth daily.    . sildenafil (VIAGRA) 100 MG tablet Take 100 mg by mouth daily as needed for erectile dysfunction.    . tamsulosin (FLOMAX) 0.4 MG CAPS capsule Take 0.4 mg by mouth daily.     Social History   Socioeconomic History   . Marital status: Married    Spouse name: Not on file  . Number of children: Not on file  . Years of education: Not on file  . Highest education level: Not on file  Occupational History  . Not on file  Tobacco Use  . Smoking status: Light Tobacco Smoker  . Smokeless tobacco: Never Used  Substance and Sexual Activity  . Alcohol use: No    Alcohol/week: 0.0 standard drinks  . Drug use: No  . Sexual activity: Not on file  Other Topics Concern  . Not on file  Social History Narrative  . Not on file   Social Determinants of Health   Financial Resource Strain:   . Difficulty of Paying Living Expenses:   Food Insecurity:   . Worried About Charity fundraiser in the Last Year:   . Arboriculturist in the Last Year:   Transportation Needs:   . Film/video editor (Medical):   Marland Kitchen Lack of Transportation (Non-Medical):   Physical Activity:   . Days of Exercise per Week:   . Minutes of Exercise per Session:   Stress:   . Feeling of Stress :   Social Connections:   . Frequency of Communication with Friends and Family:   . Frequency of Social Gatherings with Friends and Family:   . Attends Religious Services:   . Active Member of Clubs or Organizations:   . Attends Archivist Meetings:   Marland Kitchen Marital Status:   Intimate Partner Violence:   . Fear of Current or Ex-Partner:   . Emotionally Abused:   Marland Kitchen Physically Abused:   . Sexually Abused:    Family History  Problem Relation Age of Onset  . CAD Mother   . CAD Brother     OBJECTIVE:  Vitals:   01/14/20 1611  BP: 115/73  Pulse: 71  Resp: 20  Temp: (!) 97.4 F (36.3 C)  TempSrc: Oral  SpO2: 100%    General appearance: ALERT; in no acute distress.  Head: NCAT Lungs: Normal respiratory effort CV: Pedal pulse 2+ to the R, L pedal pulse not palpable Cap refill < 2 seconds to R foot, L foot cap refill >3 sec, L foot and toes are cold to touch Musculoskeletal:  Inspection: Skin warm, dry, clear and intact  without obvious erythema, effusion, or ecchymosis.  Palpation: Nontender to palpation ROM: FROM active and passive Skin: warm and dry Neurologic: Ambulates without difficulty; Sensation intact  Psychological: alert and cooperative; normal mood and affect  DIAGNOSTIC STUDIES:  VAS [place] LOWER EXTREMITY VENOUS (DVT)  Result Date: 01/14/2020  Lower Venous DVTStudy Indications: Pain.  Limitations:  Guarding secondary to pain. Comparison Study: No prior study Performing Technologist: Abram Sander RVS Supporting Technologist: Maudry Mayhew MHA, RDMS, RVT, RDCS  Examination Guidelines: A complete evaluation includes B-mode imaging, spectral Doppler, color Doppler, and power Doppler as needed of all accessible portions of each vessel. Bilateral testing is considered an integral part of a complete examination. Limited examinations for reoccurring indications may be performed as noted. The reflux portion of the exam is performed with the patient in reverse Trendelenburg.  +-----+---------------+---------+-----------+----------+--------------+ RIGHTCompressibilityPhasicitySpontaneityPropertiesThrombus Aging +-----+---------------+---------+-----------+----------+--------------+ CFV  Full           Yes      Yes                                 +-----+---------------+---------+-----------+----------+--------------+   +---------+---------------+---------+-----------+----------+--------------+ LEFT     CompressibilityPhasicitySpontaneityPropertiesThrombus Aging +---------+---------------+---------+-----------+----------+--------------+ CFV      Full           Yes      Yes                                 +---------+---------------+---------+-----------+----------+--------------+ SFJ      Full                                                        +---------+---------------+---------+-----------+----------+--------------+ FV Prox  Full                                                         +---------+---------------+---------+-----------+----------+--------------+ FV Mid   Full                                                        +---------+---------------+---------+-----------+----------+--------------+ FV DistalFull                                                        +---------+---------------+---------+-----------+----------+--------------+ PFV      Full                                                        +---------+---------------+---------+-----------+----------+--------------+ POP      Full           Yes      Yes                                 +---------+---------------+---------+-----------+----------+--------------+ PTV      Full                                                        +---------+---------------+---------+-----------+----------+--------------+  Left Technical Findings: Not visualized segments include Peroneal veins.   Summary: RIGHT: - No evidence of common femoral vein obstruction.  LEFT: - There is no evidence of deep vein thrombosis in the lower extremity. However, portions of this examination were limited- see technologist comments above.  - No cystic structure found in the popliteal fossa.  Incidental finding: the posterior tibial artery appears to be occluded. Given this finding, limited proximal interrogation was performed, and the left common femoral artery appears to have a dampened monophasic waveform. Right common femoral artery exhibits a triphasic waveform. 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  *See table(s) above for measurements and observations. Electronically signed by Ruta Hinds MD on 01/14/2020 at 6:30:18 PM.    Final      ASSESSMENT & PLAN:  1. Left foot pain   2. Pain and swelling of left lower extremity    No orders of the defined types were placed in this encounter.  Spoke with vascular after the studies were completed, they notified this NP with an emergent result  finding of severely impaired arterial flow to the L femoral artery as above. Instructed [place] tech that pt needs to be seen in the ER for further evaluation and treatment.  Reviewed expectations re: course of current medical issues. Questions answered. Outlined signs and symptoms indicating need for more acute intervention. Patient verbalized understanding. After Visit Summary given.       Faustino Congress, NP 01/14/20 1911

## 2020-01-14 NOTE — ED Triage Notes (Signed)
Left root pain 3-4 month which was worsened today and pt has been evaluated for this previously and dx with arthritis.  Pt states that the pain was markedly increased today and he went to [facility] where he had a venous doppler that showed occlusions (please see report in chart) and pt was brought here for further treatment. Pt reports that his left leg has been cold and the pain is severe.

## 2020-01-14 NOTE — Progress Notes (Signed)
ANTICOAGULATION CONSULT NOTE - Initial Consult  Pharmacy Consult for heparin Indication: arterial clot  Allergies  Allergen Reactions  . Erythromycin Shortness Of Breath and Swelling  . Simvastatin     CAUSE MUSCLE PAIN,.    Patient Measurements: Weight: 97.1 kg (214 lb) Heparin Dosing Weight: 97.1kg  Vital Signs: Temp: 97.9 F (36.6 C) (05/24 2118) Temp Source: Oral (05/24 2118) BP: 133/83 (05/24 2118) Pulse Rate: 85 (05/24 2118)  Labs: No results for input(s): HGB, HCT, PLT, APTT, LABPROT, INR, HEPARINUNFRC, HEPRLOWMOCWT, CREATININE, CKTOTAL, CKMB, TROPONINIHS in the last 72 hours.  CrCl cannot be calculated (Patient's most recent lab result is older than the maximum 21 days allowed.).   Medical History: Past Medical History:  Diagnosis Date  . Asthma   . Coronary artery disease   . Diabetes mellitus without complication ([place])   . History of kidney stones   . Hypertension   . Peripheral vascular disease ([facility])     Medications:  Infusions:  . heparin      Assessment: 89 yomo presented to the ED with foot pain and possible arterial clot. To start IV heparin. Baseline CBC is pending but has been normal in the recent past. He is not on anticoagulation PTA.   Goal of Therapy:  Heparin level 0.3-0.7 units/ml Monitor platelets by anticoagulation protocol: Yes   Plan:  Heparin bolus 5500 units IV x 1 Heparin gtt 1600 units/hr Check an 8 hr heparin level Daily heparin level and CBC  Teagen Bucio, Rande Lawman 01/14/2020,10:15 PM

## 2020-01-14 NOTE — ED Provider Notes (Signed)
I assumed care of this patient.  Please see previous provider note for further details of Hx, PE.  Briefly patient is a 74 y.o. male who presented left leg pain notable for decreased arterial flow on duplex. Vascular consulted and saw in the ED, request CT angio. Call Dr. Oneida Alar, Vasc Sx after CT.         Fatima Blank, MD 01/15/20 [phone]

## 2020-01-14 NOTE — ED Notes (Signed)
Patient going to vascular lab for ultrasound at 4:30 pm. Pt transported via wheelchair

## 2020-01-14 NOTE — ED Triage Notes (Signed)
Pt c/o ongoing left foot pain/swelling for approx 2 months. Also c/o radiating pain to medial aspect lower leg and lateral/posterior aspect upper leg. Also c/o 'throbbing to left toe"States that he had foot xrays completed.  Reports tylenol, gabapentin not helping pain.   Left ankle/foot edematous.

## 2020-01-14 NOTE — Discharge Instructions (Signed)
Go to the ER for further evaluation and treatment.

## 2020-01-14 NOTE — H&P (Signed)
Referring Physician: [place]  Patient name: Drakkar Medeiros MRN: [id number] DOB: 06/15/1946 Sex: male  REASON FOR CONSULT: Left leg ischemia  HPI: [name] is a 74 y.o. male, with a 31-month history of worsening pain in his left foot.  The pain had become bad enough that at nighttime he would wake up with aching in the foot.  Today the pain returned in his foot and did not go away so he came to the emergency room.  He has a several year history of a cramping and tight sensation in his left calf when he walks.  He was previously seen and evaluated by Dr. 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  The patient was scheduled for follow-up at the [facility] for this.  The patient does not know if he had any other further work-up or evaluation at the [place] since that time.  He has no abdominal or back pain today.  He does not know if he has any history of popliteal aneurysms.  He has had several family members who have had leg amputations.  He is able to move the foot but he is only able to bear weight minimally on the left foot secondary to pain.  He has been seen by several doctors over the last few months and told that this was probably arthritis.  Other medical problems include asthma, coronary artery disease with prior coronary stenting, diabetes, hypertension.  All of these have been stable.  He is a former smoker but only quit smoking a few weeks ago.  He is on Plavix and a statin.  Past Medical History:  Diagnosis Date  . Asthma   . Coronary artery disease   . Diabetes mellitus without complication ([place])   . History of kidney stones   . Hypertension   . Peripheral vascular disease [facility])    Past Surgical History:  Procedure Laterality Date  . CARDIAC CATHETERIZATION N/A 07/05/2016   Procedure: Left Heart Cath and Coronary Angiography;  Surgeon: Sherren Mocha, MD;  Location: [facility];  Service:  Cardiovascular;  Laterality: N/A;  . CORONARY ANGIOPLASTY    . heart stents  2006    Family History  Problem Relation Age of Onset  . CAD Mother   . CAD Brother     SOCIAL HISTORY: Social History   Socioeconomic History  . Marital status: Married    Spouse name: Not on file  . Number of children: Not on file  . Years of education: Not on file  . Highest education level: Not on file  Occupational History  . Not on file  Tobacco Use  . Smoking status: Light Tobacco Smoker  . Smokeless tobacco: Never Used  Substance and Sexual Activity  . Alcohol use: No    Alcohol/week: 0.0 standard drinks  . Drug use: No  . Sexual activity: Not on file  Other Topics Concern  . Not on file  Social History Narrative  . Not on file   Social Determinants of Health   Financial Resource Strain:   . Difficulty of Paying Living Expenses:   Food Insecurity:   . Worried About Charity fundraiser in the Last Year:   . Arboriculturist in the Last Year:   Transportation Needs:   . Film/video editor (Medical):   Marland Kitchen Lack of Transportation (Non-Medical):   Physical Activity:   .  Days of Exercise per Week:   . Minutes of Exercise per Session:   Stress:   . Feeling of Stress :   Social Connections:   . Frequency of Communication with Friends and Family:   . Frequency of Social Gatherings with Friends and Family:   . Attends Religious Services:   . Active Member of Clubs or Organizations:   . Attends Archivist Meetings:   Marland Kitchen Marital Status:   Intimate Partner Violence:   . Fear of Current or Ex-Partner:   . Emotionally Abused:   Marland Kitchen Physically Abused:   . Sexually Abused:     Allergies  Allergen Reactions  . Erythromycin Shortness Of Breath and Swelling  . Simvastatin     CAUSE MUSCLE PAIN,.    Current Facility-Administered Medications  Medication Dose Route Frequency Provider Last Rate Last Admin  . albuterol (VENTOLIN HFA) 108 (90 Base) MCG/ACT inhaler 2 puff  2 puff  Inhalation Once Drenda Freeze, MD      . heparin ADULT infusion 100 units/mL (25000 units/258mL sodium chloride 0.45%)  1,600 Units/hr Intravenous Continuous Rumbarger, Valeda Malm, RPH 16 mL/hr at 01/14/20 2238 1,600 Units/hr at 01/14/20 2238   Current Outpatient Medications  Medication Sig Dispense Refill  . acetaminophen (TYLENOL) 325 MG tablet Take 650 mg by mouth every 6 (six) hours as needed.    Marland Kitchen albuterol (PROVENTIL HFA;VENTOLIN HFA) 108 (90 BASE) MCG/ACT inhaler Inhale into the lungs every 6 (six) hours as needed for wheezing or shortness of breath. Inhale 2 puffs every 6 hours as needed for breathing    . buPROPion (ZYBAN) 150 MG 12 hr tablet Take 150 mg by mouth daily. 12hr    . cholecalciferol (VITAMIN D) 1000 units tablet Take 2,000 Units by mouth daily.    . clopidogrel (PLAVIX) 75 MG tablet Take 75 mg by mouth daily.    . diclofenac sodium (VOLTAREN) 1 % GEL Apply 4 g topically 3 (three) times daily as needed.    . doxazosin (CARDURA) 1 MG tablet Take 1 mg by mouth daily.    . finasteride (PROSCAR) 5 MG tablet Take 5 mg by mouth daily.    Marland Kitchen gabapentin (NEURONTIN) 300 MG capsule Take 300 mg by mouth 2 (two) times daily.    Marland Kitchen HYDROcodone-acetaminophen (NORCO/VICODIN) 5-325 MG tablet Take 1 tablet by mouth every 4 (four) hours as needed for moderate pain. 20 tablet 0  . hydroxypropyl methylcellulose / hypromellose (ISOPTO TEARS / GONIOVISC) 2.5 % ophthalmic solution Place 1-2 drops into both eyes 3 (three) times daily as needed for dry eyes.    . isosorbide mononitrate (IMDUR) 30 MG 24 hr tablet Take 30 mg by mouth daily.    Marland Kitchen lisinopril (PRINIVIL,ZESTRIL) 10 MG tablet     . loratadine (CLARITIN) 10 MG tablet Take 10 mg by mouth daily.    Marland Kitchen Lysine 1000 MG TABS Take 1,000 mg by mouth 2 (two) times daily.    . meclizine (ANTIVERT) 25 MG tablet Take 25 mg by mouth 3 (three) times daily as needed for dizziness or nausea.     . metFORMIN (GLUCOPHAGE-XR) 500 MG 24 hr tablet     .  nitroGLYCERIN (NITROSTAT) 0.4 MG SL tablet Place 0.4 mg under the tongue every 5 (five) minutes as needed for chest pain.    . Olodaterol HCl 2.5 MCG/ACT AERS Inhale 2 puffs into the lungs daily.    Marland Kitchen OVER THE COUNTER MEDICATION     . petrolatum-hydrophilic-aloe vera (ALOE VESTA) ointment Apply  1 application topically 3 (three) times daily.    . rosuvastatin (CRESTOR) 20 MG tablet Take 20 mg by mouth daily.    . sildenafil (VIAGRA) 100 MG tablet Take 100 mg by mouth daily as needed for erectile dysfunction.    . tamsulosin (FLOMAX) 0.4 MG CAPS capsule Take 0.4 mg by mouth daily.      ROS:   General:  No weight loss, Fever, chills  HEENT: No recent headaches, no nasal bleeding, no visual changes, no sore throat  Neurologic: No dizziness, blackouts, seizures. No recent symptoms of stroke or mini- stroke. No recent episodes of slurred speech, or temporary blindness.  Cardiac: No recent episodes of chest pain/pressure, no shortness of breath at rest.  No shortness of breath with exertion.  Denies history of atrial fibrillation or irregular heartbeat  Vascular: + history of rest pain in feet.  + history of claudication.  No history of non-healing ulcer, No history of DVT   Pulmonary: No home oxygen, no productive cough, no hemoptysis,  No asthma or wheezing  Musculoskeletal:  [ ]  Arthritis, [X]  Low back pain,  [X]  Joint pain  Hematologic:No history of hypercoagulable state.  No history of easy bleeding.  No history of anemia  Gastrointestinal: No hematochezia or melena,  No gastroesophageal reflux, no trouble swallowing   Urinary: [ ]  chronic Kidney disease, [ ]  on HD - [ ]  MWF or [ ]  TTHS, [ ]  Burning with urination, [ ]  Frequent urination, [ ]  Difficulty urinating;   Skin: No rashes  Psychological: No history of anxiety,  No history of depression   Physical Examination  Vitals:   01/14/20 2120 01/14/20 2215 01/14/20 2245 01/14/20 2315  BP:  (!) 152/92 133/81 (!) 130/94  Pulse:   74 71 69  Resp:  (!) 22 18 (!) 22  Temp:      TempSrc:      SpO2:  99% 98% 99%  Weight: 97.1 kg     Height: 6\' 2"  (1.88 m)       Body mass index is 27.48 kg/m.  General:  Alert and oriented, no acute distress HEENT: Normal Neck: No JVD Cardiac: Slightly irregular rhythm with PVCs noted on monitor no obvious atrial fibrillation Abdomen: Soft, non-tender, non-distended, pulsatile mass in the epigastrium consistent with 4.7 cm abdominal aortic aneurysm nontender Skin: No rash Extremity Pulses:  2+ radial, brachial, 2+ right absent left femoral pulse, 3+ right popliteal absent left popliteal pulse, absent dorsalis pedis, posterior tibial pulses bilaterally, patient does have brisk Doppler flow posterior tibial and dorsalis pedis in the right foot there is no Doppler flow in the left foot the left leg is cool from about the knee level into the foot Musculoskeletal: No deformity left ankle is edematous compared to the right edema  Neurologic: Upper and lower extremity motor 5/5 and symmetric.  He is able to move the left ankle and toes.  He states that there is some change in sensation in the left foot.  He says occasionally it feels like the foot starts to freeze up.  DATA:  CTA abdomen pelvis 2017 4.7 cm AAA with >80% left external iliac stenosis no iliac aneurysms.  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  Difficult to know if this is a thrombosed popliteal aneurysm or progression of disease with known left external iliac stenosis since 2017.   Also has history of 4.7 cm AAA since 20`7 which could act as embolic source in addition to PAD risk factors of smoking and diabetes.   PLAN:  CTA with runoff to further clarify above.  Pt is at very high risk of limb loss and was informed of this.  Continue heparin for now.   Ruta Hinds, MD [facility] Office: [phone] Pager: [phone]

## 2020-01-14 NOTE — Progress Notes (Signed)
Incidental finding of left lower extremity arterial occlusion found during lower extremity venous duplex exam. Patient reports increasing pain with ambulation and cool left lower extremity when compared to right for about a month, with it being worse since Friday. Unable to perform ABI due to significant LLE pain and movement. Called report to NP at Urgent Care, and given instructions to bring pt to ED for evaluation.  01/14/2020 5:24 PM Kelby Aline., MHA, RVT, RDCS, RDMS

## 2020-01-14 NOTE — ED Notes (Signed)
Pt transported to CT ?

## 2020-01-14 NOTE — ED Provider Notes (Signed)
[facility] EMERGENCY DEPARTMENT Provider Note   CSN: [id number] Arrival date & time: 01/14/20  1728     History Chief Complaint  Patient presents with  . Circulatory Problem    [name] is a 74 y.o. male history of CAD with stents on Plavix, hypertension, here presenting with left leg pain. Patient has been having left leg pain for the last several months He states that over the last week or so it got significantly worse.  He states that he is on gabapentin with no relief.  He states that his left foot feels cold now.  He went to urgent care and had a DVT study that showed no DVT.  However he was noted to have occluded left posterior tibial artery.  A limited vascular study was performed and he only has left monophasic femoral artery pulse and was concern for possible left iliac occlusion.  Patient was sent to the ER for vascular evaluation.  The history is provided by the patient.       Past Medical History:  Diagnosis Date  . Asthma   . Coronary artery disease   . Diabetes mellitus without complication ([place])   . History of kidney stones   . Hypertension   . Peripheral vascular disease [facility])     Patient Active Problem List   Diagnosis Date Noted  . Tobacco abuse 06/02/2017  . CAD (coronary artery disease) 07/12/2016  . Bradycardia 07/06/2016  . Chest pain with high risk for cardiac etiology 07/03/2016  . Hypokalemia 07/03/2016  . DM (diabetes mellitus) ([place]) 07/03/2016  . Essential hypertension 07/03/2016  . Gross hematuria 07/03/2016    Past Surgical History:  Procedure Laterality Date  . CARDIAC CATHETERIZATION N/A 07/05/2016   Procedure: Left Heart Cath and Coronary Angiography;  Surgeon: Sherren Mocha, MD;  Location: [facility];  Service: Cardiovascular;  Laterality: N/A;  . CORONARY ANGIOPLASTY    . heart stents  2006       Family History  Problem Relation Age of Onset  . CAD Mother   . CAD Brother     Social History    Tobacco Use  . Smoking status: Light Tobacco Smoker  . Smokeless tobacco: Never Used  Substance Use Topics  . Alcohol use: No    Alcohol/week: 0.0 standard drinks  . Drug use: No    Home Medications Prior to Admission medications   Medication Sig Start Date End Date Taking? Authorizing Provider  acetaminophen (TYLENOL) 325 MG tablet Take 650 mg by mouth every 6 (six) hours as needed.    [provider]  albuterol (PROVENTIL HFA;VENTOLIN HFA) 108 (90 BASE) MCG/ACT inhaler Inhale into the lungs every 6 (six) hours as needed for wheezing or shortness of breath. Inhale 2 puffs every 6 hours as needed for breathing    [provider]  buPROPion (ZYBAN) 150 MG 12 hr tablet Take 150 mg by mouth daily. 12hr    [provider]  cholecalciferol (VITAMIN D) 1000 units tablet Take 2,000 Units by mouth daily.    [provider]  clopidogrel (PLAVIX) 75 MG tablet Take 75 mg by mouth daily.    [provider]  diclofenac sodium (VOLTAREN) 1 % GEL Apply 4 g topically 3 (three) times daily as needed.    [provider]  doxazosin (CARDURA) 1 MG tablet Take 1 mg by mouth daily.    [provider]  finasteride (PROSCAR) 5 MG tablet Take 5 mg by mouth daily.  [provider]  gabapentin (NEURONTIN) 300 MG capsule Take 300 mg by mouth 2 (two) times daily.    [provider]  HYDROcodone-acetaminophen (NORCO/VICODIN) 5-325 MG tablet Take 1 tablet by mouth every 4 (four) hours as needed for moderate pain. 12/18/19   Daleen Bo, MD  hydroxypropyl methylcellulose / hypromellose (ISOPTO TEARS / GONIOVISC) 2.5 % ophthalmic solution Place 1-2 drops into both eyes 3 (three) times daily as needed for dry eyes.    [provider]  isosorbide mononitrate (IMDUR) 30 MG 24 hr tablet Take 30 mg by mouth daily.    [provider]  lisinopril (PRINIVIL,ZESTRIL) 10 MG tablet  09/27/18   [provider]  loratadine  (CLARITIN) 10 MG tablet Take 10 mg by mouth daily.    [provider]  Lysine 1000 MG TABS Take 1,000 mg by mouth 2 (two) times daily.    [provider]  meclizine (ANTIVERT) 25 MG tablet Take 25 mg by mouth 3 (three) times daily as needed for dizziness or nausea.     [provider]  metFORMIN (GLUCOPHAGE-XR) 500 MG 24 hr tablet  10/17/18   [provider]  nitroGLYCERIN (NITROSTAT) 0.4 MG SL tablet Place 0.4 mg under the tongue every 5 (five) minutes as needed for chest pain.    [provider]  Olodaterol HCl 2.5 MCG/ACT AERS Inhale 2 puffs into the lungs daily.    [provider]  OVER THE COUNTER MEDICATION     [provider]  petrolatum-hydrophilic-aloe vera (ALOE VESTA) ointment Apply 1 application topically 3 (three) times daily.    [provider]  rosuvastatin (CRESTOR) 20 MG tablet Take 20 mg by mouth daily.    [provider]  sildenafil (VIAGRA) 100 MG tablet Take 100 mg by mouth daily as needed for erectile dysfunction.    [provider]  tamsulosin (FLOMAX) 0.4 MG CAPS capsule Take 0.4 mg by mouth daily.    [provider]    Allergies    Erythromycin and Simvastatin  Review of Systems   Review of Systems  Musculoskeletal:       L leg pain   All other systems reviewed and are negative.   Physical Exam Updated Vital Signs BP (!) 152/92   Pulse 74   Temp 97.9 F (36.6 C) (Oral)   Resp (!) 22   Ht 6\' 2"  (1.88 m)   Wt 97.1 kg   SpO2 99%   BMI 27.48 kg/m   Physical Exam Vitals and nursing note reviewed.  HENT:     Head: Normocephalic.     Nose: Nose normal.     Mouth/Throat:     Mouth: Mucous membranes are moist.  Eyes:     Extraocular Movements: Extraocular movements intact.     Pupils: Pupils are equal, round, and reactive to light.  Cardiovascular:     Rate and Rhythm: Normal rate.     Pulses: Normal pulses.  Pulmonary:     Effort: Pulmonary effort is  normal.  Abdominal:     General: Abdomen is flat.  Musculoskeletal:     Cervical back: Normal range of motion.     Comments: Diminished left femoral pulse.  I was unable to palpate left posterior tibial pulse and the entire left calf is cold.  I was unable to Doppler the PT or DP pulses.  No capillary refill in the left foot.  Right leg has normal pulses and normal capillary refill.  Neurological:  General: No focal deficit present.     Mental Status: He is alert.  Psychiatric:        Mood and Affect: Mood normal.     ED Results / Procedures / Treatments   Labs (all labs ordered are listed, but only abnormal results are displayed) Labs Reviewed  CBC  BASIC METABOLIC PANEL  PROTIME-INR  HEPARIN LEVEL (UNFRACTIONATED)  CBC  I-STAT CHEM 8, ED    EKG None  Radiology VAS [place] LOWER EXTREMITY VENOUS (DVT)  Result Date: 01/14/2020  Lower Venous DVTStudy Indications: Pain.  Limitations: Guarding secondary to pain. Comparison Study: No prior study Performing Technologist: Abram Sander RVS Supporting Technologist: Maudry Mayhew MHA, RDMS, RVT, RDCS  Examination Guidelines: A complete evaluation includes B-mode imaging, spectral Doppler, color Doppler, and power Doppler as needed of all accessible portions of each vessel. Bilateral testing is considered an integral part of a complete examination. Limited examinations for reoccurring indications may be performed as noted. The reflux portion of the exam is performed with the patient in reverse Trendelenburg.  +-----+---------------+---------+-----------+----------+--------------+ RIGHTCompressibilityPhasicitySpontaneityPropertiesThrombus Aging +-----+---------------+---------+-----------+----------+--------------+ CFV  Full           Yes      Yes                                 +-----+---------------+---------+-----------+----------+--------------+   +---------+---------------+---------+-----------+----------+--------------+  LEFT     CompressibilityPhasicitySpontaneityPropertiesThrombus Aging +---------+---------------+---------+-----------+----------+--------------+ CFV      Full           Yes      Yes                                 +---------+---------------+---------+-----------+----------+--------------+ SFJ      Full                                                        +---------+---------------+---------+-----------+----------+--------------+ FV Prox  Full                                                        +---------+---------------+---------+-----------+----------+--------------+ FV Mid   Full                                                        +---------+---------------+---------+-----------+----------+--------------+ FV DistalFull                                                        +---------+---------------+---------+-----------+----------+--------------+ PFV      Full                                                        +---------+---------------+---------+-----------+----------+--------------+  POP      Full           Yes      Yes                                 +---------+---------------+---------+-----------+----------+--------------+ PTV      Full                                                        +---------+---------------+---------+-----------+----------+--------------+   Left Technical Findings: Not visualized segments include Peroneal veins.   Summary: RIGHT: - No evidence of common femoral vein obstruction.  LEFT: - There is no evidence of deep vein thrombosis in the lower extremity. However, portions of this examination were limited- see technologist comments above.  - No cystic structure found in the popliteal fossa.  Incidental finding: the posterior tibial artery appears to be occluded. Given this finding, limited proximal interrogation was performed, and the left common femoral artery appears to have a dampened monophasic waveform.  Right common femoral artery exhibits a triphasic waveform. 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  *See table(s) above for measurements and observations. Electronically signed by Ruta Hinds MD on 01/14/2020 at 6:30:18 PM.    Final     Procedures Procedures (including critical care time)  CRITICAL CARE Performed by: Wandra Arthurs   Total critical care time: 30 minutes  Critical care time was exclusive of separately billable procedures and treating other patients.  Critical care was necessary to treat or prevent imminent or life-threatening deterioration.  Critical care was time spent personally by me on the following activities: development of treatment plan with patient and/or surrogate as well as nursing, discussions with consultants, evaluation of patient's response to treatment, examination of patient, obtaining history from patient or surrogate, ordering and performing treatments and interventions, ordering and review of laboratory studies, ordering and review of radiographic studies, pulse oximetry and re-evaluation of patient's condition.   Medications Ordered in ED Medications  morphine 4 MG/ML injection 4 mg (has no administration in time range)  heparin bolus via infusion 5,500 Units (has no administration in time range)  heparin ADULT infusion 100 units/mL (25000 units/284mL sodium chloride 0.45%) (has no administration in time range)    ED Course  I have reviewed the triage vital signs and the nursing notes.  Pertinent labs & imaging results that were available during my care of the patient were reviewed by me and considered in my medical decision making (see chart for details).    MDM Rules/Calculators/A&P                      [name] is a 74 y.o. male here presenting with ischemic left leg.  He had symptoms for the last 2 months but over the last week it got worse.  The left foot is cold and I was unable to get a dopplerable pulses of the  left posterior tibial which is consistent with his ultrasound earlier today .  I consulted Dr. Oneida Alar from vascular surgery who will see patient.  Will start on heparin.   11:18 PM Dr. Eden Lathe saw patient.  He agreed with heparin and CT angiogram.  He will follow up on CT results.  Care signed  out to Dr. Leonette Monarch in the ED.   Final Clinical Impression(s) / ED Diagnoses Final diagnoses:  None    Rx / DC Orders ED Discharge Orders    None       Drenda Freeze, MD 01/14/20 2319

## 2020-01-14 NOTE — Progress Notes (Signed)
Lower extremity venous has been completed.   Preliminary results in CV Proc.   [name] 01/14/2020 5:11 PM

## 2020-01-15 ENCOUNTER — Inpatient Hospital Stay (HOSPITAL_COMMUNITY): Payer: No Typology Code available for payment source | Admitting: Anesthesiology

## 2020-01-15 ENCOUNTER — Inpatient Hospital Stay (HOSPITAL_COMMUNITY): Payer: No Typology Code available for payment source

## 2020-01-15 ENCOUNTER — Encounter (HOSPITAL_COMMUNITY): Payer: Self-pay | Admitting: Vascular Surgery

## 2020-01-15 ENCOUNTER — Encounter (HOSPITAL_COMMUNITY)
Admission: EM | Disposition: A | Discharge status: Skilled Nursing Facility | Payer: Self-pay | Source: Home / Self Care | Attending: Vascular Surgery

## 2020-01-15 DIAGNOSIS — Z7401 Bed confinement status: Secondary | ICD-10-CM | POA: Diagnosis not present

## 2020-01-15 DIAGNOSIS — R1032 Left lower quadrant pain: Secondary | ICD-10-CM | POA: Diagnosis not present

## 2020-01-15 DIAGNOSIS — I714 Abdominal aortic aneurysm, without rupture, unspecified: Secondary | ICD-10-CM | POA: Diagnosis present

## 2020-01-15 DIAGNOSIS — G4733 Obstructive sleep apnea (adult) (pediatric): Secondary | ICD-10-CM | POA: Diagnosis present

## 2020-01-15 DIAGNOSIS — Z87442 Personal history of urinary calculi: Secondary | ICD-10-CM | POA: Diagnosis not present

## 2020-01-15 DIAGNOSIS — J4540 Moderate persistent asthma, uncomplicated: Secondary | ICD-10-CM | POA: Diagnosis not present

## 2020-01-15 DIAGNOSIS — I4891 Unspecified atrial fibrillation: Secondary | ICD-10-CM | POA: Diagnosis not present

## 2020-01-15 DIAGNOSIS — F17200 Nicotine dependence, unspecified, uncomplicated: Secondary | ICD-10-CM | POA: Diagnosis present

## 2020-01-15 DIAGNOSIS — M79605 Pain in left leg: Secondary | ICD-10-CM | POA: Diagnosis not present

## 2020-01-15 DIAGNOSIS — R52 Pain, unspecified: Secondary | ICD-10-CM | POA: Diagnosis not present

## 2020-01-15 DIAGNOSIS — Z7984 Long term (current) use of oral hypoglycemic drugs: Secondary | ICD-10-CM | POA: Diagnosis not present

## 2020-01-15 DIAGNOSIS — M79672 Pain in left foot: Secondary | ICD-10-CM | POA: Diagnosis present

## 2020-01-15 DIAGNOSIS — E1151 Type 2 diabetes mellitus with diabetic peripheral angiopathy without gangrene: Secondary | ICD-10-CM | POA: Diagnosis present

## 2020-01-15 DIAGNOSIS — R531 Weakness: Secondary | ICD-10-CM | POA: Diagnosis not present

## 2020-01-15 DIAGNOSIS — M2550 Pain in unspecified joint: Secondary | ICD-10-CM | POA: Diagnosis not present

## 2020-01-15 DIAGNOSIS — Z20822 Contact with and (suspected) exposure to covid-19: Secondary | ICD-10-CM | POA: Diagnosis present

## 2020-01-15 DIAGNOSIS — I10 Essential (primary) hypertension: Secondary | ICD-10-CM | POA: Diagnosis not present

## 2020-01-15 DIAGNOSIS — Z79899 Other long term (current) drug therapy: Secondary | ICD-10-CM | POA: Diagnosis not present

## 2020-01-15 DIAGNOSIS — I745 Embolism and thrombosis of iliac artery: Secondary | ICD-10-CM | POA: Diagnosis present

## 2020-01-15 DIAGNOSIS — Z9889 Other specified postprocedural states: Secondary | ICD-10-CM | POA: Diagnosis not present

## 2020-01-15 DIAGNOSIS — Z955 Presence of coronary angioplasty implant and graft: Secondary | ICD-10-CM | POA: Diagnosis not present

## 2020-01-15 DIAGNOSIS — J449 Chronic obstructive pulmonary disease, unspecified: Secondary | ICD-10-CM | POA: Diagnosis present

## 2020-01-15 DIAGNOSIS — I2510 Atherosclerotic heart disease of native coronary artery without angina pectoris: Secondary | ICD-10-CM | POA: Diagnosis not present

## 2020-01-15 DIAGNOSIS — Z888 Allergy status to other drugs, medicaments and biological substances status: Secondary | ICD-10-CM | POA: Diagnosis not present

## 2020-01-15 DIAGNOSIS — Z881 Allergy status to other antibiotic agents status: Secondary | ICD-10-CM | POA: Diagnosis not present

## 2020-01-15 DIAGNOSIS — Z720 Tobacco use: Secondary | ICD-10-CM | POA: Diagnosis not present

## 2020-01-15 DIAGNOSIS — I480 Paroxysmal atrial fibrillation: Secondary | ICD-10-CM | POA: Diagnosis not present

## 2020-01-15 DIAGNOSIS — M6281 Muscle weakness (generalized): Secondary | ICD-10-CM | POA: Diagnosis not present

## 2020-01-15 DIAGNOSIS — E118 Type 2 diabetes mellitus with unspecified complications: Secondary | ICD-10-CM | POA: Diagnosis not present

## 2020-01-15 DIAGNOSIS — Z8249 Family history of ischemic heart disease and other diseases of the circulatory system: Secondary | ICD-10-CM | POA: Diagnosis not present

## 2020-01-15 DIAGNOSIS — I739 Peripheral vascular disease, unspecified: Secondary | ICD-10-CM | POA: Diagnosis not present

## 2020-01-15 DIAGNOSIS — I7389 Other specified peripheral vascular diseases: Secondary | ICD-10-CM | POA: Diagnosis not present

## 2020-01-15 DIAGNOSIS — D649 Anemia, unspecified: Secondary | ICD-10-CM | POA: Diagnosis not present

## 2020-01-15 DIAGNOSIS — I998 Other disorder of circulatory system: Secondary | ICD-10-CM | POA: Diagnosis present

## 2020-01-15 DIAGNOSIS — R918 Other nonspecific abnormal finding of lung field: Secondary | ICD-10-CM | POA: Diagnosis not present

## 2020-01-15 DIAGNOSIS — Z8679 Personal history of other diseases of the circulatory system: Secondary | ICD-10-CM | POA: Diagnosis not present

## 2020-01-15 DIAGNOSIS — Z7902 Long term (current) use of antithrombotics/antiplatelets: Secondary | ICD-10-CM | POA: Diagnosis not present

## 2020-01-15 DIAGNOSIS — M1990 Unspecified osteoarthritis, unspecified site: Secondary | ICD-10-CM | POA: Diagnosis present

## 2020-01-15 DIAGNOSIS — N401 Enlarged prostate with lower urinary tract symptoms: Secondary | ICD-10-CM | POA: Diagnosis not present

## 2020-01-15 DIAGNOSIS — R0602 Shortness of breath: Secondary | ICD-10-CM | POA: Diagnosis not present

## 2020-01-15 DIAGNOSIS — E876 Hypokalemia: Secondary | ICD-10-CM | POA: Diagnosis not present

## 2020-01-15 DIAGNOSIS — I959 Hypotension, unspecified: Secondary | ICD-10-CM | POA: Diagnosis not present

## 2020-01-15 HISTORY — PX: ENDOVASCULAR STENT INSERTION: SHX5161

## 2020-01-15 HISTORY — PX: INSERTION OF ILIAC STENT: SHX6256

## 2020-01-15 HISTORY — PX: THROMBECTOMY ILIAC ARTERY: SHX6405

## 2020-01-15 HISTORY — PX: LOWER EXTREMITY ANGIOGRAM: SHX5508

## 2020-01-15 LAB — BASIC METABOLIC PANEL
Anion gap: 7 (ref 5–15)
Anion gap: 6 (ref 5–15)
BUN: 14 mg/dL (ref 8–23)
BUN: 13 mg/dL (ref 8–23)
CO2: 26 mmol/L (ref 22–32)
CO2: 24 mmol/L (ref 22–32)
Calcium: 8.9 mg/dL (ref 8.9–10.3)
Calcium: 8.4 mg/dL — ABNORMAL LOW (ref 8.9–10.3)
Chloride: 102 mmol/L (ref 98–111)
Chloride: 108 mmol/L (ref 98–111)
Creatinine, Ser: 1.03 mg/dL (ref 0.61–1.24)
Creatinine, Ser: 1.01 mg/dL (ref 0.61–1.24)
GFR calc Af Amer: >60 mL/min (ref >60)
GFR calc Af Amer: >60 mL/min (ref >60)
GFR calc non Af Amer: >60 mL/min (ref >60)
GFR calc non Af Amer: >60 mL/min (ref >60)
Glucose, Bld: 130 mg/dL — ABNORMAL HIGH (ref 70–99)
Glucose, Bld: 171 mg/dL — ABNORMAL HIGH (ref 70–99)
Potassium: 3.7 mmol/L (ref 3.5–5.1)
Potassium: 3.9 mmol/L (ref 3.5–5.1)
Sodium: 135 mmol/L (ref 135–145)
Sodium: 138 mmol/L (ref 135–145)

## 2020-01-15 LAB — CBC
HCT: 39.9 % (ref 39.0–52.0)
HCT: 38.3 % — ABNORMAL LOW (ref 39.0–52.0)
Hemoglobin: 12.9 g/dL — ABNORMAL LOW (ref 13.0–17.0)
Hemoglobin: 12.2 g/dL — ABNORMAL LOW (ref 13.0–17.0)
MCH: 28.7 pg (ref 26.0–34.0)
MCH: 28.6 pg (ref 26.0–34.0)
MCHC: 32.3 g/dL (ref 30.0–36.0)
MCHC: 31.9 g/dL (ref 30.0–36.0)
MCV: 88.7 fL (ref 80.0–100.0)
MCV: 89.7 fL (ref 80.0–100.0)
Platelets: 165 10*3/uL (ref 150–400)
Platelets: 158 10*3/uL (ref 150–400)
RBC: 4.5 MIL/uL (ref 4.22–5.81)
RBC: 4.27 MIL/uL (ref 4.22–5.81)
RDW: 12.5 % (ref 11.5–15.5)
RDW: 12.5 % (ref 11.5–15.5)
WBC: 5.8 10*3/uL (ref 4.0–10.5)
WBC: 9.1 10*3/uL (ref 4.0–10.5)
nRBC: 0 % (ref 0.0–0.2)
nRBC: 0 % (ref 0.0–0.2)

## 2020-01-15 LAB — MAGNESIUM: Magnesium: 1.6 mg/dL — ABNORMAL LOW (ref 1.7–2.4)

## 2020-01-15 LAB — GLUCOSE, CAPILLARY
Glucose-Capillary: 126 mg/dL — ABNORMAL HIGH (ref 70–99)
Glucose-Capillary: 156 mg/dL — ABNORMAL HIGH (ref 70–99)
Glucose-Capillary: 171 mg/dL — ABNORMAL HIGH (ref 70–99)
Glucose-Capillary: 156 mg/dL — ABNORMAL HIGH (ref 70–99)

## 2020-01-15 LAB — HEMOGLOBIN A1C
Hgb A1c MFr Bld: 8.2 % — ABNORMAL HIGH (ref 4.8–5.6)
Mean Plasma Glucose: 188.64 mg/dL

## 2020-01-15 LAB — TYPE AND SCREEN
ABO/RH(D): O POS
Antibody Screen: NEGATIVE

## 2020-01-15 LAB — HEPARIN LEVEL (UNFRACTIONATED): Heparin Unfractionated: <0.1 IU/mL — ABNORMAL LOW (ref 0.30–0.70)

## 2020-01-15 LAB — PROTIME-INR
INR: 1.1 (ref 0.8–1.2)
Prothrombin Time: 13.4 seconds (ref 11.4–15.2)

## 2020-01-15 LAB — APTT: aPTT: 29 seconds (ref 24–36)

## 2020-01-15 LAB — SARS CORONAVIRUS 2 BY RT PCR (HOSPITAL ORDER, PERFORMED IN ~~LOC~~ HOSPITAL LAB): SARS Coronavirus 2: NEGATIVE

## 2020-01-15 LAB — ABO/RH: ABO/RH(D): O POS

## 2020-01-15 SURGERY — ENDOVASCULAR STENT GRAFT INSERTION
Anesthesia: General

## 2020-01-15 MED ORDER — GABAPENTIN 600 MG PO TABS
600.0000 mg | ORAL_TABLET | Freq: Two times a day (BID) | ORAL | Status: DC
  Administered 2020-01-15 (×2): 600 mg via ORAL
  Filled 2020-01-15 (×2): qty 1

## 2020-01-15 MED ORDER — SODIUM CHLORIDE 0.9 % IV SOLN: 500.0000 mL | Freq: Once | INTRAVENOUS | Status: DC | PRN

## 2020-01-15 MED ORDER — GUAIFENESIN-DM 100-10 MG/5ML PO SYRP: 15.0000 mL | ORAL_SOLUTION | ORAL | Status: DC | PRN

## 2020-01-15 MED ORDER — LYSINE 1000 MG PO TABS: 1000.0000 mg | ORAL_TABLET | Freq: Two times a day (BID) | ORAL | Status: DC

## 2020-01-15 MED ORDER — HYDRALAZINE HCL 20 MG/ML IJ SOLN
10.0000 mg | INTRAMUSCULAR | Status: DC | PRN
  Administered 2020-01-15: 10 mg via INTRAVENOUS
  Filled 2020-01-15: qty 1

## 2020-01-15 MED ORDER — CEFAZOLIN SODIUM-DEXTROSE 2-4 GM/100ML-% IV SOLN
2.0000 g | Freq: Three times a day (TID) | INTRAVENOUS | Status: AC
  Administered 2020-01-15 – 2020-01-16 (×2): 2 g via INTRAVENOUS
  Filled 2020-01-15 (×2): qty 100

## 2020-01-15 MED ORDER — FENTANYL CITRATE (PF) 250 MCG/5ML IJ SOLN
INTRAMUSCULAR | Status: AC
  Filled 2020-01-15: qty 5

## 2020-01-15 MED ORDER — INSULIN ASPART 100 UNIT/ML ~~LOC~~ SOLN
0.0000 [IU] | Freq: Three times a day (TID) | SUBCUTANEOUS | Status: DC
  Administered 2020-01-15 – 2020-01-17 (×6): 3 [IU] via SUBCUTANEOUS
  Administered 2020-01-18: 2 [IU] via SUBCUTANEOUS
  Administered 2020-01-18: 3 [IU] via SUBCUTANEOUS
  Administered 2020-01-18: 2 [IU] via SUBCUTANEOUS
  Administered 2020-01-19: 5 [IU] via SUBCUTANEOUS
  Administered 2020-01-19 (×2): 3 [IU] via SUBCUTANEOUS
  Administered 2020-01-20: 5 [IU] via SUBCUTANEOUS
  Administered 2020-01-21: 2 [IU] via SUBCUTANEOUS
  Administered 2020-01-21 (×2): 3 [IU] via SUBCUTANEOUS
  Administered 2020-01-22: 2 [IU] via SUBCUTANEOUS

## 2020-01-15 MED ORDER — ONDANSETRON HCL 4 MG/2ML IJ SOLN: 4.0000 mg | Freq: Once | INTRAMUSCULAR | Status: DC | PRN

## 2020-01-15 MED ORDER — PROPOFOL 10 MG/ML IV BOLUS
INTRAVENOUS | Status: DC | PRN
  Administered 2020-01-15: 120 mg via INTRAVENOUS

## 2020-01-15 MED ORDER — PANTOPRAZOLE SODIUM 40 MG PO TBEC
40.0000 mg | DELAYED_RELEASE_TABLET | Freq: Every day | ORAL | Status: DC
  Administered 2020-01-16 – 2020-01-22 (×7): 40 mg via ORAL
  Filled 2020-01-15 (×7): qty 1

## 2020-01-15 MED ORDER — HEPARIN SODIUM (PORCINE) 1000 UNIT/ML IJ SOLN
INTRAMUSCULAR | Status: DC | PRN
Start: 2020-01-15 — End: 2020-01-15
  Administered 2020-01-15: 10000 [IU] via INTRAVENOUS

## 2020-01-15 MED ORDER — ONDANSETRON HCL 4 MG/2ML IJ SOLN: 4.0000 mg | Freq: Four times a day (QID) | INTRAMUSCULAR | Status: DC | PRN

## 2020-01-15 MED ORDER — PHENYLEPHRINE 40 MCG/ML (10ML) SYRINGE FOR IV PUSH (FOR BLOOD PRESSURE SUPPORT)
PREFILLED_SYRINGE | INTRAVENOUS | Status: DC | PRN
  Administered 2020-01-15: 120 ug via INTRAVENOUS

## 2020-01-15 MED ORDER — METOPROLOL TARTRATE 5 MG/5ML IV SOLN
2.0000 mg | INTRAVENOUS | Status: DC | PRN
  Administered 2020-01-16: 5 mg via INTRAVENOUS
  Filled 2020-01-15: qty 5

## 2020-01-15 MED ORDER — HEPARIN SODIUM (PORCINE) 1000 UNIT/ML IJ SOLN
INTRAMUSCULAR | Status: AC
  Filled 2020-01-15: qty 1

## 2020-01-15 MED ORDER — ALUM & MAG HYDROXIDE-SIMETH 200-200-20 MG/5ML PO SUSP: 15.0000 mL | ORAL | Status: DC | PRN

## 2020-01-15 MED ORDER — SODIUM CHLORIDE 0.9 % IV SOLN: INTRAVENOUS | Status: DC | PRN

## 2020-01-15 MED ORDER — HYPROMELLOSE (GONIOSCOPIC) 2.5 % OP SOLN: 1.0000 [drp] | OPHTHALMIC | Status: DC

## 2020-01-15 MED ORDER — ESMOLOL HCL 100 MG/10ML IV SOLN
INTRAVENOUS | Status: DC | PRN
Start: 2020-01-15 — End: 2020-01-15
  Administered 2020-01-15: 30 mg via INTRAVENOUS

## 2020-01-15 MED ORDER — DEXAMETHASONE SODIUM PHOSPHATE 10 MG/ML IJ SOLN
INTRAMUSCULAR | Status: AC
  Filled 2020-01-15: qty 1

## 2020-01-15 MED ORDER — LIDOCAINE 2% (20 MG/ML) 5 ML SYRINGE
INTRAMUSCULAR | Status: DC | PRN
  Administered 2020-01-15: 40 mg via INTRAVENOUS

## 2020-01-15 MED ORDER — DEXAMETHASONE SODIUM PHOSPHATE 10 MG/ML IJ SOLN
INTRAMUSCULAR | Status: DC | PRN
  Administered 2020-01-15: 5 mg via INTRAVENOUS

## 2020-01-15 MED ORDER — ROSUVASTATIN CALCIUM 20 MG PO TABS
20.0000 mg | ORAL_TABLET | Freq: Every day | ORAL | Status: DC
  Administered 2020-01-15 – 2020-01-21 (×2): 20 mg via ORAL
  Filled 2020-01-15 (×3): qty 1

## 2020-01-15 MED ORDER — MECLIZINE HCL 25 MG PO TABS
25.0000 mg | ORAL_TABLET | Freq: Three times a day (TID) | ORAL | Status: DC | PRN
  Filled 2020-01-15: qty 1

## 2020-01-15 MED ORDER — DOXAZOSIN MESYLATE 2 MG PO TABS
1.0000 mg | ORAL_TABLET | Freq: Every day | ORAL | Status: DC
  Administered 2020-01-16 – 2020-01-22 (×7): 1 mg via ORAL
  Filled 2020-01-15 (×8): qty 1

## 2020-01-15 MED ORDER — MORPHINE SULFATE (PF) 2 MG/ML IV SOLN
2.0000 mg | INTRAVENOUS | Status: DC | PRN
  Administered 2020-01-15: 4 mg via INTRAVENOUS
  Filled 2020-01-15: qty 2

## 2020-01-15 MED ORDER — ROCURONIUM BROMIDE 10 MG/ML (PF) SYRINGE
PREFILLED_SYRINGE | INTRAVENOUS | Status: AC
  Filled 2020-01-15: qty 10

## 2020-01-15 MED ORDER — MORPHINE SULFATE (PF) 2 MG/ML IV SOLN
2.0000 mg | INTRAVENOUS | Status: DC | PRN
  Administered 2020-01-15 – 2020-01-16 (×3): 2 mg via INTRAVENOUS
  Administered 2020-01-16: 4 mg via INTRAVENOUS
  Administered 2020-01-17: 2 mg via INTRAVENOUS
  Administered 2020-01-18 – 2020-01-19 (×2): 4 mg via INTRAVENOUS
  Administered 2020-01-21: 2 mg via INTRAVENOUS
  Filled 2020-01-15 (×3): qty 1
  Filled 2020-01-15: qty 2
  Filled 2020-01-15: qty 1
  Filled 2020-01-15 (×2): qty 2
  Filled 2020-01-15: qty 1

## 2020-01-15 MED ORDER — ISOSORBIDE MONONITRATE ER 30 MG PO TB24
30.0000 mg | ORAL_TABLET | Freq: Every day | ORAL | Status: DC
  Administered 2020-01-16 – 2020-01-22 (×7): 30 mg via ORAL
  Filled 2020-01-15 (×7): qty 1

## 2020-01-15 MED ORDER — LABETALOL HCL 5 MG/ML IV SOLN: 10.0000 mg | INTRAVENOUS | Status: DC | PRN

## 2020-01-15 MED ORDER — EPHEDRINE SULFATE-NACL 50-0.9 MG/10ML-% IV SOSY
PREFILLED_SYRINGE | INTRAVENOUS | Status: DC | PRN
  Administered 2020-01-15: 10 mg via INTRAVENOUS
  Administered 2020-01-15: 5 mg via INTRAVENOUS

## 2020-01-15 MED ORDER — ACETAMINOPHEN 650 MG RE SUPP: 325.0000 mg | RECTAL | Status: DC | PRN

## 2020-01-15 MED ORDER — CEFAZOLIN SODIUM-DEXTROSE 2-4 GM/100ML-% IV SOLN
2.0000 g | INTRAVENOUS | Status: AC
  Filled 2020-01-15: qty 100

## 2020-01-15 MED ORDER — CEFAZOLIN SODIUM 1 G IJ SOLR
INTRAMUSCULAR | Status: AC
  Filled 2020-01-15: qty 20

## 2020-01-15 MED ORDER — SODIUM CHLORIDE 0.9 % IV SOLN: INTRAVENOUS | Status: AC

## 2020-01-15 MED ORDER — PHENYLEPHRINE HCL-NACL 10-0.9 MG/250ML-% IV SOLN
INTRAVENOUS | Status: DC | PRN
  Administered 2020-01-15: 25 ug/min via INTRAVENOUS

## 2020-01-15 MED ORDER — 0.9 % SODIUM CHLORIDE (POUR BTL) OPTIME
TOPICAL | Status: DC | PRN
  Administered 2020-01-15: 2000 mL

## 2020-01-15 MED ORDER — CLOPIDOGREL BISULFATE 75 MG PO TABS
75.0000 mg | ORAL_TABLET | Freq: Every day | ORAL | Status: DC
  Administered 2020-01-16 – 2020-01-22 (×7): 75 mg via ORAL
  Filled 2020-01-15 (×7): qty 1

## 2020-01-15 MED ORDER — LIDOCAINE 2% (20 MG/ML) 5 ML SYRINGE
INTRAMUSCULAR | Status: AC
  Filled 2020-01-15: qty 5

## 2020-01-15 MED ORDER — SODIUM CHLORIDE 0.9 % IV SOLN
INTRAVENOUS | Status: AC
  Filled 2020-01-15: qty 1.2

## 2020-01-15 MED ORDER — PROTAMINE SULFATE 10 MG/ML IV SOLN
INTRAVENOUS | Status: DC | PRN
Start: 2020-01-15 — End: 2020-01-15
  Administered 2020-01-15: 50 mg via INTRAVENOUS

## 2020-01-15 MED ORDER — ARFORMOTEROL TARTRATE 15 MCG/2ML IN NEBU
15.0000 ug | INHALATION_SOLUTION | Freq: Two times a day (BID) | RESPIRATORY_TRACT | Status: DC
  Administered 2020-01-15 – 2020-01-22 (×13): 15 ug via RESPIRATORY_TRACT
  Filled 2020-01-15 (×14): qty 2

## 2020-01-15 MED ORDER — HYDRALAZINE HCL 20 MG/ML IJ SOLN: 5.0000 mg | INTRAMUSCULAR | Status: DC | PRN

## 2020-01-15 MED ORDER — FINASTERIDE 5 MG PO TABS
5.0000 mg | ORAL_TABLET | Freq: Every day | ORAL | Status: DC
  Administered 2020-01-16 – 2020-01-22 (×7): 5 mg via ORAL
  Filled 2020-01-15 (×7): qty 1

## 2020-01-15 MED ORDER — CEFAZOLIN SODIUM-DEXTROSE 2-3 GM-%(50ML) IV SOLR
INTRAVENOUS | Status: DC | PRN
  Administered 2020-01-15: 2 g via INTRAVENOUS

## 2020-01-15 MED ORDER — LORATADINE 10 MG PO TABS
10.0000 mg | ORAL_TABLET | Freq: Every day | ORAL | Status: DC
  Administered 2020-01-16 – 2020-01-22 (×7): 10 mg via ORAL
  Filled 2020-01-15 (×7): qty 1

## 2020-01-15 MED ORDER — SODIUM CHLORIDE (PF) 0.9 % IJ SOLN
INTRAMUSCULAR | Status: AC
  Filled 2020-01-15: qty 10

## 2020-01-15 MED ORDER — DOCUSATE SODIUM 100 MG PO CAPS
100.0000 mg | ORAL_CAPSULE | Freq: Every day | ORAL | Status: DC
  Administered 2020-01-16 – 2020-01-22 (×7): 100 mg via ORAL
  Filled 2020-01-15 (×7): qty 1

## 2020-01-15 MED ORDER — ALBUTEROL SULFATE (2.5 MG/3ML) 0.083% IN NEBU: 2.5000 mg | INHALATION_SOLUTION | Freq: Four times a day (QID) | RESPIRATORY_TRACT | Status: DC | PRN

## 2020-01-15 MED ORDER — FENTANYL CITRATE (PF) 100 MCG/2ML IJ SOLN: 25.0000 ug | INTRAMUSCULAR | Status: DC | PRN

## 2020-01-15 MED ORDER — SODIUM CHLORIDE 0.9 % IV SOLN
INTRAVENOUS | Status: DC | PRN
  Administered 2020-01-15: 1000 mL

## 2020-01-15 MED ORDER — IODIXANOL 320 MG/ML IV SOLN
INTRAVENOUS | Status: DC | PRN
  Administered 2020-01-15: 110 mL

## 2020-01-15 MED ORDER — OXYCODONE-ACETAMINOPHEN 5-325 MG PO TABS
1.0000 | ORAL_TABLET | ORAL | Status: DC | PRN
  Administered 2020-01-15 (×2): 2 via ORAL
  Administered 2020-01-15: 1 via ORAL
  Administered 2020-01-16 – 2020-01-17 (×3): 2 via ORAL
  Administered 2020-01-17: 1 via ORAL
  Administered 2020-01-18 – 2020-01-21 (×13): 2 via ORAL
  Filled 2020-01-15 (×16): qty 2
  Filled 2020-01-15: qty 1
  Filled 2020-01-15 (×3): qty 2

## 2020-01-15 MED ORDER — PHENOL 1.4 % MT LIQD: 1.0000 | OROMUCOSAL | Status: DC | PRN

## 2020-01-15 MED ORDER — SODIUM CHLORIDE 0.9 % IV SOLN: INTRAVENOUS | Status: DC

## 2020-01-15 MED ORDER — ACETAMINOPHEN 325 MG PO TABS
325.0000 mg | ORAL_TABLET | ORAL | Status: DC | PRN
  Administered 2020-01-17: 650 mg via ORAL
  Filled 2020-01-15: qty 2

## 2020-01-15 MED ORDER — ONDANSETRON HCL 4 MG/2ML IJ SOLN
INTRAMUSCULAR | Status: AC
  Filled 2020-01-15: qty 2

## 2020-01-15 MED ORDER — PROPOFOL 10 MG/ML IV BOLUS
INTRAVENOUS | Status: AC
  Filled 2020-01-15: qty 20

## 2020-01-15 MED ORDER — MAGNESIUM SULFATE 2 GM/50ML IV SOLN: 2.0000 g | Freq: Every day | INTRAVENOUS | Status: DC | PRN

## 2020-01-15 MED ORDER — LISINOPRIL 10 MG PO TABS
10.0000 mg | ORAL_TABLET | Freq: Every day | ORAL | Status: DC
  Administered 2020-01-16 – 2020-01-22 (×6): 10 mg via ORAL
  Filled 2020-01-15 (×7): qty 1

## 2020-01-15 MED ORDER — ROCURONIUM BROMIDE 10 MG/ML (PF) SYRINGE
PREFILLED_SYRINGE | INTRAVENOUS | Status: DC | PRN
  Administered 2020-01-15: 40 mg via INTRAVENOUS
  Administered 2020-01-15: 60 mg via INTRAVENOUS

## 2020-01-15 MED ORDER — VITAMIN D 25 MCG (1000 UNIT) PO TABS
1000.0000 [IU] | ORAL_TABLET | Freq: Every day | ORAL | Status: DC
  Administered 2020-01-16 – 2020-01-22 (×7): 1000 [IU] via ORAL
  Filled 2020-01-15 (×7): qty 1

## 2020-01-15 MED ORDER — CHLORHEXIDINE GLUCONATE CLOTH 2 % EX PADS: 6.0000 | MEDICATED_PAD | Freq: Every day | CUTANEOUS | Status: DC

## 2020-01-15 MED ORDER — POTASSIUM CHLORIDE CRYS ER 20 MEQ PO TBCR: 20.0000 meq | EXTENDED_RELEASE_TABLET | Freq: Every day | ORAL | Status: DC | PRN

## 2020-01-15 MED ORDER — HEPARIN SODIUM (PORCINE) 5000 UNIT/ML IJ SOLN
5000.0000 [IU] | Freq: Three times a day (TID) | INTRAMUSCULAR | Status: DC
  Administered 2020-01-15 – 2020-01-17 (×5): 5000 [IU] via SUBCUTANEOUS
  Filled 2020-01-15 (×5): qty 1

## 2020-01-15 MED ORDER — ONDANSETRON HCL 4 MG/2ML IJ SOLN
INTRAMUSCULAR | Status: DC | PRN
  Administered 2020-01-15: 4 mg via INTRAVENOUS

## 2020-01-15 MED ORDER — ARTIFICIAL TEARS OPHTHALMIC OINT
TOPICAL_OINTMENT | OPHTHALMIC | Status: AC
  Filled 2020-01-15: qty 3.5

## 2020-01-15 MED ORDER — TAMSULOSIN HCL 0.4 MG PO CAPS
0.4000 mg | ORAL_CAPSULE | Freq: Every day | ORAL | Status: DC
  Administered 2020-01-15 – 2020-01-21 (×8): 0.4 mg via ORAL
  Filled 2020-01-15 (×8): qty 1

## 2020-01-15 MED ORDER — POLYVINYL ALCOHOL 1.4 % OP SOLN
1.0000 [drp] | OPHTHALMIC | Status: DC
  Administered 2020-01-16: 2 [drp] via OPHTHALMIC
  Administered 2020-01-17: 1 [drp] via OPHTHALMIC
  Administered 2020-01-18: 2 [drp] via OPHTHALMIC
  Filled 2020-01-15: qty 15

## 2020-01-15 MED ORDER — BUPROPION HCL ER (SR) 150 MG PO TB12
150.0000 mg | ORAL_TABLET | Freq: Every day | ORAL | Status: DC
  Administered 2020-01-16 – 2020-01-22 (×7): 150 mg via ORAL
  Filled 2020-01-15 (×7): qty 1

## 2020-01-15 MED ORDER — LABETALOL HCL 5 MG/ML IV SOLN
INTRAVENOUS | Status: AC
  Filled 2020-01-15: qty 4

## 2020-01-15 MED ORDER — NITROGLYCERIN 0.4 MG SL SUBL: 0.4000 mg | SUBLINGUAL_TABLET | SUBLINGUAL | Status: DC | PRN

## 2020-01-15 MED ORDER — PHENYLEPHRINE 40 MCG/ML (10ML) SYRINGE FOR IV PUSH (FOR BLOOD PRESSURE SUPPORT)
PREFILLED_SYRINGE | INTRAVENOUS | Status: AC
  Filled 2020-01-15: qty 10

## 2020-01-15 MED ORDER — LABETALOL HCL 5 MG/ML IV SOLN
10.0000 mg | INTRAVENOUS | Status: DC | PRN
  Administered 2020-01-15 (×2): 10 mg via INTRAVENOUS

## 2020-01-15 MED ORDER — EPHEDRINE 5 MG/ML INJ
INTRAVENOUS | Status: AC
  Filled 2020-01-15: qty 10

## 2020-01-15 MED ORDER — FENTANYL CITRATE (PF) 250 MCG/5ML IJ SOLN
INTRAMUSCULAR | Status: DC | PRN
  Administered 2020-01-15: 100 ug via INTRAVENOUS
  Administered 2020-01-15: 50 ug via INTRAVENOUS

## 2020-01-15 SURGICAL SUPPLY — 114 items
ARMBAND PINK RESTRICT EXTREMIT (MISCELLANEOUS) ×10 IMPLANT
BALLN MUSTANG 7X60X75 (BALLOONS) ×5
BALLOON MUSTANG 7X60X75 (BALLOONS) IMPLANT
BANDAGE ESMARK 6X9 LF (GAUZE/BANDAGES/DRESSINGS) IMPLANT
BLADE CLIPPER SURG (BLADE) ×5 IMPLANT
BNDG ELASTIC 4X5.8 VLCR STR LF (GAUZE/BANDAGES/DRESSINGS) IMPLANT
BNDG ESMARK 6X9 LF (GAUZE/BANDAGES/DRESSINGS)
CANISTER SUCT 3000ML PPV (MISCELLANEOUS) ×13 IMPLANT
CANNULA VESSEL 3MM 2 BLNT TIP (CANNULA) IMPLANT
CATH BEACON 5.038 65CM KMP-01 (CATHETERS) ×5 IMPLANT
CATH EMB 3FR 80CM (CATHETERS) IMPLANT
CATH EMB 4FR 40CM (CATHETERS) ×2 IMPLANT
CATH EMB 4FR 80CM (CATHETERS) ×5 IMPLANT
CATH EMB 5FR 80CM (CATHETERS) IMPLANT
CATH OMNI FLUSH .035X70CM (CATHETERS) ×5 IMPLANT
CLIP VESOCCLUDE MED 24/CT (CLIP) ×5 IMPLANT
CLIP VESOCCLUDE MED 6/CT (CLIP) ×7 IMPLANT
CLIP VESOCCLUDE SM WIDE 24/CT (CLIP) ×5 IMPLANT
CLIP VESOCCLUDE SM WIDE 6/CT (CLIP) ×7 IMPLANT
COVER MAYO STAND STRL (DRAPES) ×2 IMPLANT
COVER WAND RF STERILE (DRAPES) ×11 IMPLANT
CUFF TOURN SGL QUICK 24 (TOURNIQUET CUFF)
CUFF TOURN SGL QUICK 34 (TOURNIQUET CUFF)
CUFF TOURN SGL QUICK 42 (TOURNIQUET CUFF) IMPLANT
CUFF TRNQT CYL 24X4X16.5-23 (TOURNIQUET CUFF) IMPLANT
CUFF TRNQT CYL 34X4.125X (TOURNIQUET CUFF) IMPLANT
DERMABOND ADVANCED (GAUZE/BANDAGES/DRESSINGS) ×4
DERMABOND ADVANCED .7 DNX12 (GAUZE/BANDAGES/DRESSINGS) ×9 IMPLANT
DEVICE CLOSURE PERCLS PRGLD 6F (VASCULAR PRODUCTS) IMPLANT
DEVICE TORQUE KENDALL .025-038 (MISCELLANEOUS) IMPLANT
DRAIN CHANNEL 15F RND FF W/TCR (WOUND CARE) IMPLANT
DRAPE HALF SHEET 40X57 (DRAPES) ×4 IMPLANT
DRAPE X-RAY CASS 24X20 (DRAPES) IMPLANT
DRSG TEGADERM 2-3/8X2-3/4 SM (GAUZE/BANDAGES/DRESSINGS) ×6 IMPLANT
ELECT CAUTERY BLADE 6.4 (BLADE) ×2 IMPLANT
ELECT REM PT RETURN 9FT ADLT (ELECTROSURGICAL) ×10
ELECTRODE REM PT RTRN 9FT ADLT (ELECTROSURGICAL) ×12 IMPLANT
EVACUATOR SILICONE 100CC (DRAIN) IMPLANT
EXCLUDER TNK 23X14.5MMX12CM (Endovascular Graft) IMPLANT
EXCLUDER TRUNK 23X14.5MMX12CM (Endovascular Graft) ×5 IMPLANT
EXTENDER ENDOPROSTHESIS 10X7 (Endovascular Graft) ×2 IMPLANT
EXTENDER ENDOPROSTHESIS 14X7 (Endovascular Graft) ×2 IMPLANT
GAUZE SPONGE 2X2 8PLY STRL LF (GAUZE/BANDAGES/DRESSINGS) ×6 IMPLANT
GLIDEWIRE ADV .035X180CM (WIRE) ×2 IMPLANT
GLOVE BIO SURGEON STRL SZ7.5 (GLOVE) ×15 IMPLANT
GOWN STRL REUS W/ TWL LRG LVL3 (GOWN DISPOSABLE) ×18 IMPLANT
GOWN STRL REUS W/ TWL XL LVL3 (GOWN DISPOSABLE) ×12 IMPLANT
GOWN STRL REUS W/TWL LRG LVL3 (GOWN DISPOSABLE) ×10
GOWN STRL REUS W/TWL XL LVL3 (GOWN DISPOSABLE) ×35
GRAFT BALLN CATH 65CM (STENTS) ×3 IMPLANT
GUIDEWIRE ANGLED .035X150CM (WIRE) IMPLANT
HEMOSTAT SNOW SURGICEL 2X4 (HEMOSTASIS) IMPLANT
INSERT FOGARTY SM (MISCELLANEOUS) ×5 IMPLANT
KIT BASIN OR (CUSTOM PROCEDURE TRAY) ×13 IMPLANT
KIT ENCORE 26 ADVANTAGE (KITS) ×2 IMPLANT
KIT TURNOVER KIT B (KITS) ×15 IMPLANT
LEG CONTRALATERAL 16X12X14 (Vascular Products) ×5 IMPLANT
LOOP VESSEL MAXI BLUE (MISCELLANEOUS) ×6 IMPLANT
LOOP VESSEL MINI RED (MISCELLANEOUS) ×4 IMPLANT
MARKER GRAFT CORONARY BYPASS (MISCELLANEOUS) IMPLANT
NDL PERC 18GX7CM (NEEDLE) IMPLANT
NEEDLE PERC 18GX7CM (NEEDLE) IMPLANT
NS IRRIG 1000ML POUR BTL (IV SOLUTION) ×20 IMPLANT
PACK CV ACCESS (CUSTOM PROCEDURE TRAY) ×5 IMPLANT
PACK ENDOVASCULAR (PACKS) ×5 IMPLANT
PACK PERIPHERAL VASCULAR (CUSTOM PROCEDURE TRAY) ×3 IMPLANT
PAD ARMBOARD 7.5X6 YLW CONV (MISCELLANEOUS) ×26 IMPLANT
PENCIL BUTTON HOLSTER BLD 10FT (ELECTRODE) ×2 IMPLANT
PERCLOSE PROGLIDE 6F (VASCULAR PRODUCTS) ×10
SET COLLECT BLD 21X3/4 12 (NEEDLE) IMPLANT
SET MICROPUNCTURE 5F STIFF (MISCELLANEOUS) ×7 IMPLANT
SHEATH BRITE TIP 8FR 23CM (SHEATH) ×5 IMPLANT
SHEATH PINNACLE 5F 10CM (SHEATH) ×2 IMPLANT
SHEATH PINNACLE 8F 10CM (SHEATH) ×5 IMPLANT
SPONGE GAUZE 2X2 STER 10/PKG (GAUZE/BANDAGES/DRESSINGS)
STENT GRAFT BALLN CATH 65CM (STENTS) ×5
STENT GRAFT CONTRALAT 16X12X14 (Vascular Products) IMPLANT
STENT INNOVA 7X60X130 (Permanent Stent) ×2 IMPLANT
STOPCOCK 4 WAY LG BORE MALE ST (IV SETS) ×4 IMPLANT
STOPCOCK MORSE 400PSI 3WAY (MISCELLANEOUS) ×3 IMPLANT
SUT ETHILON 3 0 PS 1 (SUTURE) IMPLANT
SUT GORETEX 6.0 TH-9 30 IN (SUTURE) ×5 IMPLANT
SUT GORETEX 6.0 TT13 (SUTURE) IMPLANT
SUT GORETEX 6.0 TT9 (SUTURE) IMPLANT
SUT GORETEX CV-6TTC-13 36IN (SUTURE) ×5 IMPLANT
SUT MNCRL AB 4-0 PS2 18 (SUTURE) ×16 IMPLANT
SUT PROLENE 5 0 C 1 24 (SUTURE) ×10 IMPLANT
SUT PROLENE 6 0 BV (SUTURE) ×10 IMPLANT
SUT PROLENE 7 0 BV 1 (SUTURE) IMPLANT
SUT SILK 2 0 (SUTURE) ×5
SUT SILK 2 0 PERMA HAND 18 BK (SUTURE) ×2 IMPLANT
SUT SILK 2 0 SH (SUTURE) ×3 IMPLANT
SUT SILK 2-0 18XBRD TIE 12 (SUTURE) IMPLANT
SUT SILK 3 0 (SUTURE) ×5
SUT SILK 3-0 18XBRD TIE 12 (SUTURE) IMPLANT
SUT SILK 4 0 (SUTURE) ×5
SUT SILK 4-0 18XBRD TIE 12 (SUTURE) IMPLANT
SUT VIC AB 2-0 CT1 27 (SUTURE) ×5
SUT VIC AB 2-0 CT1 TAPERPNT 27 (SUTURE) ×6 IMPLANT
SUT VIC AB 3-0 SH 27 (SUTURE) ×10
SUT VIC AB 3-0 SH 27X BRD (SUTURE) ×9 IMPLANT
SYR 20ML LL LF (SYRINGE) ×3 IMPLANT
SYR 3ML LL SCALE MARK (SYRINGE) ×2 IMPLANT
SYR BULB IRRIG 60ML STRL (SYRINGE) ×2 IMPLANT
TAPE UMBILICAL COTTON 1/8X30 (MISCELLANEOUS) IMPLANT
TOWEL GREEN STERILE (TOWEL DISPOSABLE) ×15 IMPLANT
TRAY FOLEY MTR SLVR 16FR STAT (SET/KITS/TRAYS/PACK) ×8 IMPLANT
TUBING CIL FLEX 10 FLL-RA (TUBING) ×2 IMPLANT
TUBING EXTENTION W/L.L. (IV SETS) IMPLANT
TUBING INJECTOR 48 (MISCELLANEOUS) ×5 IMPLANT
UNDERPAD 30X36 HEAVY ABSORB (UNDERPADS AND DIAPERS) ×10 IMPLANT
WATER STERILE IRR 1000ML POUR (IV SOLUTION) ×10 IMPLANT
WIRE AMPLATZ SS-J .035X180CM (WIRE) ×8 IMPLANT
WIRE BENTSON .035X145CM (WIRE) ×8 IMPLANT

## 2020-01-15 NOTE — Op Note (Signed)
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 Donzetta Matters, MD Procedure Performed: 1.  Open exposure of left common femoral artery with left common external iliac thromboembolectomy 2.  Percutaneous access closure right common femoral artery 3.  Endovascular aneurysm repair with main body right 23 x 14 x 12 cm extended with 14 x 7 cm and contralateral limb left 12 x 14 cm extended with 10 x 7 cm 4.  Stent of left external iliac artery with 7 x 60 Innova 5.  Left lower extremity angiography  Indications: 74 year old male with known abdominal aortic aneurysm.  He presented with acute on chronic left lower extremity ischemia.  Aneurysm is grown to 5 cm.  He is indicated for aneurysm repair with thromboembolectomy possible invention of the left lower extremity.  Findings: Common femoral artery was soft although thickened there was no pulsatility initially.  We were able to retrieve some clot from the common external iliac artery thrombectomy on the left.  We did not pass a Fogarty distally down the left leg given the disease.  We were able to cross the occluded common iliac artery performed endovascular aneurysm repair and at completion there were no endoleak's.  There was a dissection of the external leg artery likely secondary to passing Fogarty and this was stented with self-expanding noncovered Innova.  We perform left lower extremity angiography was patent the SFA and popliteal and trifurcation.  He then had significant tibial disease with posterior tibial artery being the dominant runoff.  There was a monophasic posterior tibial signal at completion on the left side strong anterior tibial and posterior tibial signals on the right.   Procedure:  The patient was identified in the holding area and taken to the operating  room where he was placed upon the operative general anesthesia induced.  He was sterilely prepped draped in the abdomen bilateral groins and left lower extremity antibiotics were minister timeout was called.  We used ultrasound to identify the common femoral artery on the left made a transverse incision.  Dissected down placed Vesseloops around the common femoral artery proximally and distally.  We then used ultrasound to identify the common femoral artery on the right cannulated this micropuncture needle followed the wire and sheath.  We placed a Bentson wire we dilated the tract with 8 French sheath dilator and placed 2 percutaneous closure devices.  We then placed an 8 French sheath over the Bentson wire.  Patient was fully heparinized at this time.  After heparin to circulate for 3 minutes of mid transverse arteriotomy in the common femoral artery on the left.  We had good backbleeding but no antegrade bleeding.  We passed the Fogarty catheter 3 times through the external leg artery and common iliac artery but could not get it to pass further than 25 cm.  He did retrieve some clot did get better antegrade bleeding although was weakly pulsatile.  The artery was then clamped.  We then placed a 5 French sheath and cinched a vessel loop around this through our arteriotomy.  We used Kumpe catheter and Glidewire advantage we able to cross into the aorta and confirmed intraluminal access.  We then placed a 12 French sheath into the aorta from the side.  On the right side we then used a Kumpe catheter exchanged for an Amplatz wire and placed a 16 French sheath.  The main body was brought to the level of L2 which was the lowest right renal artery.  Main body was 23 x 14 x 12 cm's.  We put an Omni catheter through the left side performed aortogram.  We then deployed our device proximally.  From the left side we then cannulated the gate with Kumpe catheter and Glidewire advantage.  An Omni catheter was placed with the were  able to spin this to confirm access into the graft.  We then extended first with 12 x 14 cm by [place] in 10 x 7 cm device.  We then completed deploying on the right side and extended with 14 x 7 cm device.  We then ballooned the entirety of the graft.  Completion angiography demonstrated no endoleak's but there was a dissection of the distal external artery.  This was then stented primarily with noncovered self-expanding stent 7 x 60 mm.  This is primarily ballooned with 7 x 60 mm balloon.  Completion retrograde imaging demonstrated no residual dissection.  With this we percutaneously close the right common femoral artery.  We had good anterior tibial and posterior tibial signals.  The left side we then removed our sheath and clamped our common femoral artery proximally and distally.  We closed the arteriotomy primarily with 5-0 Prolene suture and prior to completion we allowed flushing all directions.  Upon completion there was very weak posterior tibial signal no other signals of the foot.  We then placed a micropuncture needle followed wire and sheath.  We performed left lower extremity angiography.  He was patent at the level of the trifurcation but then had severe tibial disease.  With this we placed figure-of-eight suture around our micropuncture sheath and removed our sheath.  We administered 50 mg of protamine.  We irrigated the wound and closed in layers with Vicryl Monocryl on the left.  Skin was closed on the right side with Monocryl and Dermabond placed at both sites.  He was awakened anesthesia having tolerated procedure without any complication.  EBL: 250 cc    Christne Platts C. Donzetta Matters, MD [facility] [place] Office: [phone] Pager: [phone]

## 2020-01-15 NOTE — Progress Notes (Signed)
  Progress Note    01/15/2020 7:19 AM Day of Surgery  Subjective:  Left foot is cold  Vitals:   01/14/20 2315 01/15/20 0243  BP: (!) 130/94 130/83  Pulse: 69 64  Resp: (!) 22 20  Temp:  97.6 F (36.4 C)  SpO2: 99% 97%    Physical Exam: aaox3 Non labored respirations Abdomen is soft Palpable right common femoral pulse, left common femoral is not palpable  CBC    Component Value Date/Time   WBC 5.8 01/15/2020 0601   RBC 4.50 01/15/2020 0601   HGB 12.9 (L) 01/15/2020 0601   HCT 39.9 01/15/2020 0601   PLT 165 01/15/2020 0601   MCV 88.7 01/15/2020 0601   MCH 28.7 01/15/2020 0601   MCHC 32.3 01/15/2020 0601   RDW 12.5 01/15/2020 0601   LYMPHSABS 2.8 12/17/2019 1312   MONOABS 0.6 12/17/2019 1312   EOSABS 0.1 12/17/2019 1312   BASOSABS 0.0 12/17/2019 1312    BMET    Component Value Date/Time   NA 135 01/15/2020 0601   K 3.7 01/15/2020 0601   CL 102 01/15/2020 0601   CO2 26 01/15/2020 0601   GLUCOSE 130 (H) 01/15/2020 0601   BUN 14 01/15/2020 0601   CREATININE 1.03 01/15/2020 0601   CALCIUM 8.9 01/15/2020 0601   GFRNONAA >60 01/15/2020 0601   GFRAA >60 01/15/2020 0601    INR    Component Value Date/Time   INR 1.0 01/14/2020 2134     Intake/Output Summary (Last 24 hours) at 01/15/2020 0719 Last data filed at 01/15/2020 0522 Gross per 24 hour  Intake 272.81 ml  Output 175 ml  Net 97.81 ml     Assessment/plan:  74 y.o. male is here with 5cm AAA and acute on chronic left lower extremity ischemia. Will plan for evar with possible left lower extremity thrombectomy today.   Bryann Mcnealy C. Donzetta Matters, MD [facility] [place] Office: [phone] Pager: [phone]  01/15/2020 7:19 AM

## 2020-01-15 NOTE — Progress Notes (Signed)
Spoke to MD Donzetta Matters and he will be by to see patient and evaluate his leg after his current case. Pt is resting. Will continue to monitor

## 2020-01-15 NOTE — Anesthesia Procedure Notes (Signed)
Procedure Name: Intubation Date/Time: 01/15/2020 7:44 AM Performed by: Alain Marion, CRNA Pre-anesthesia Checklist: Patient identified, Emergency Drugs available, Suction available and Patient being monitored Patient Re-evaluated:Patient Re-evaluated prior to induction Oxygen Delivery Method: Circle System Utilized Preoxygenation: Pre-oxygenation with 100% oxygen Induction Type: IV induction Ventilation: Mask ventilation without difficulty Laryngoscope Size: Miller and 3 Grade View: Grade I Tube type: Oral Tube size: 7.5 mm Number of attempts: 1 Airway Equipment and Method: Stylet Placement Confirmation: ETT inserted through vocal cords under direct vision,  positive ETCO2 and breath sounds checked- equal and bilateral Secured at: 24 cm Tube secured with: Tape Dental Injury: Teeth and Oropharynx as per pre-operative assessment

## 2020-01-15 NOTE — Progress Notes (Signed)
MD to bedside no new orders

## 2020-01-15 NOTE — Anesthesia Preprocedure Evaluation (Addendum)
Anesthesia Evaluation  Patient identified by MRN, date of birth, ID band Patient awake    Reviewed: Allergy & Precautions, NPO status , Patient's Chart, lab work & pertinent test results  Airway Mallampati: II  TM Distance: >3 FB Neck ROM: Full    Dental no notable dental hx.    Pulmonary asthma , Current Smoker and Patient abstained from smoking.,    Pulmonary exam normal breath sounds clear to auscultation       Cardiovascular hypertension, Pt. on medications + CAD and + Peripheral Vascular Disease  Normal cardiovascular exam Rhythm:Regular Rate:Normal  ECG: SR, rate 66   Neuro/Psych negative neurological ROS  negative psych ROS   GI/Hepatic negative GI ROS, Neg liver ROS,   Endo/Other  diabetes, Oral Hypoglycemic Agents  Renal/GU negative Renal ROS     Musculoskeletal negative musculoskeletal ROS (+)   Abdominal   Peds  Hematology HLD   Anesthesia Other Findings Acute on chronic ischemia left leg  Reproductive/Obstetrics                           Anesthesia Physical Anesthesia Plan  ASA: IV  Anesthesia Plan: General   Post-op Pain Management:    Induction: Intravenous  PONV Risk Score and Plan: 1 and Ondansetron, Dexamethasone and Treatment may vary due to age or medical condition  Airway Management Planned: Oral ETT  Additional Equipment: Arterial line  Intra-op Plan:   Post-operative Plan: Extubation in OR  Informed Consent: I have reviewed the patients History and Physical, chart, labs and discussed the procedure including the risks, benefits and alternatives for the proposed anesthesia with the patient or authorized representative who has indicated his/her understanding and acceptance.     Dental advisory given  Plan Discussed with: CRNA  Anesthesia Plan Comments:        Anesthesia Quick Evaluation

## 2020-01-15 NOTE — Progress Notes (Signed)
Pt c/o pain to L thigh area. Upon assessment L leg is red and hot to touch. PA paged, will await orders.  Rainey Pines, RN

## 2020-01-15 NOTE — Progress Notes (Signed)
[place] for heparin Indication: arterial clot  Allergies  Allergen Reactions  . Erythromycin Shortness Of Breath and Swelling  . Simvastatin     CAUSE MUSCLE PAIN,.  . Statins Other (See Comments)    Muscle pain    Patient Measurements: Height: 6\' 2"  (188 cm) Weight: 91.1 kg (200 lb 13.4 oz) IBW/kg (Calculated) : 82.2 Heparin Dosing Weight: 97.1kg  Vital Signs: Temp: 98.1 F (36.7 C) (05/25 1145) Temp Source: Oral (05/25 0243) BP: 167/67 (05/25 1227) Pulse Rate: 68 (05/25 1145)  Labs: Recent Labs    01/14/20 2134 01/14/20 2134 01/14/20 2255 01/14/20 2255 01/15/20 0601 01/15/20 1108  HGB 14.2   < > 15.3   < > 12.9* 12.2*  HCT 45.0   < > 45.0  --  39.9 38.3*  PLT 187  --   --   --  165 158  APTT  --   --   --   --   --  29  LABPROT 12.7  --   --   --   --  13.4  INR 1.0  --   --   --   --  1.1  HEPARINUNFRC  --   --   --   --   --  <0.10*  CREATININE 1.09   < > 1.20  --  1.03 1.01   < > = values in this interval not displayed.    Estimated Creatinine Clearance: 75.7 mL/min (by C-G formula based on SCr of 1.01 mg/dL).   Medical History: Past Medical History:  Diagnosis Date  . Asthma   . Coronary artery disease   . Diabetes mellitus without complication ([place])   . History of kidney stones   . Hypertension   . Peripheral vascular disease ([facility])     Medications:  Infusions:  . sodium chloride    . sodium chloride 100 mL/hr at 01/15/20 1220  .  ceFAZolin (ANCEF) IV Stopped (01/15/20 0143)  .  ceFAZolin (ANCEF) IV    . magnesium sulfate bolus IVPB      Assessment: 57 yomo presented to the ED with foot pain and possible arterial clot. To start IV heparin. Baseline CBC is pending but has been normal in the recent past. He is not on anticoagulation PTA.   Heparin level undetectable this morning.  Now s/p thromboembolectomy, heparin not resumed post-procedure.  Goal of Therapy:  Heparin level 0.3-0.7  units/ml Monitor platelets by anticoagulation protocol: Yes   Plan:  Heparin d/c'd. Pharmacy will sign off. Please contact if questions.  [place] Crane, Roylene Reason, BCCP Clinical Pharmacist  01/15/2020 12:56 PM   [facility] pharmacy phone numbers are listed on [email or web address]

## 2020-01-15 NOTE — ED Notes (Signed)
Second IV attempted x 3 without success. Heparin and ancef compatible per micromedex

## 2020-01-15 NOTE — Transfer of Care (Signed)
Immediate Anesthesia Transfer of Care Note  Patient: [name]  Procedure(s) Performed: ENDOVASCULAR ANEURYSM REPAIR (N/A ) INSERTION OF LEFT EXTERNAL ILIAC ARTERY STENT (Left ) THROMBECTOMY OF LEFT ILIAC ARTERY Left Lower Extremity Angiogram  Patient Location: PACU  Anesthesia Type:General  Level of Consciousness: awake, alert  and oriented  Airway & Oxygen Therapy: Patient Spontanous Breathing and Patient connected to face mask oxygen  Post-op Assessment: Report given to RN and Post -op Vital signs reviewed and stable  Post vital signs: Reviewed and stable  Last Vitals:  Vitals Value Taken Time  BP 164/87   Temp    Pulse 78   Resp 14   SpO2 99     Last Pain:  Vitals:   01/15/20 0243  TempSrc: Oral  PainSc: 8          Complications: No apparent anesthesia complications

## 2020-01-15 NOTE — Progress Notes (Addendum)
  Progress Note    01/15/2020 4:01 PM Day of Surgery  Subjective:  No complaints, states that left leg feels much better   Vitals:   01/15/20 1500 01/15/20 1525  BP: 103/64 116/65  Pulse:  74  Resp: 14 19  Temp:  98.3 F (36.8 C)  SpO2: 98% 98%   Physical Exam: General: slightly drowsy, well appearing, well nourished, not in any distress Cardiac: regular Lungs:  Non labored Incisions:  Left femoral incision clean, dry and intact without hematoma. Right femoral access site clean, dry and intact without hematoma Extremities:  2+ femoral and popliteal pulses bilaterally.Right foot warm with Doppler DP/ PT signals. Left foot cool with faint doppler PT. Motor intact. Abdomen:  Soft and non tender, non distended Neurologic: alert and oriented  CBC    Component Value Date/Time   WBC 9.1 01/15/2020 1108   RBC 4.27 01/15/2020 1108   HGB 12.2 (L) 01/15/2020 1108   HCT 38.3 (L) 01/15/2020 1108   PLT 158 01/15/2020 1108   MCV 89.7 01/15/2020 1108   MCH 28.6 01/15/2020 1108   MCHC 31.9 01/15/2020 1108   RDW 12.5 01/15/2020 1108   LYMPHSABS 2.8 12/17/2019 1312   MONOABS 0.6 12/17/2019 1312   EOSABS 0.1 12/17/2019 1312   BASOSABS 0.0 12/17/2019 1312    BMET    Component Value Date/Time   NA 138 01/15/2020 1108   K 3.9 01/15/2020 1108   CL 108 01/15/2020 1108   CO2 24 01/15/2020 1108   GLUCOSE 171 (H) 01/15/2020 1108   BUN 13 01/15/2020 1108   CREATININE 1.01 01/15/2020 1108   CALCIUM 8.4 (L) 01/15/2020 1108   GFRNONAA >60 01/15/2020 1108   GFRAA >60 01/15/2020 1108    INR    Component Value Date/Time   INR 1.1 01/15/2020 1108     Intake/Output Summary (Last 24 hours) at 01/15/2020 1601 Last data filed at 01/15/2020 1500 Gross per 24 hour  Intake 2259.48 ml  Output 1150 ml  Net 1109.48 ml     Assessment/Plan:  74 y.o. male is s/p Open exposure of left common femoral artery with left common external iliac thromboembolectomy 2.  Percutaneous access closure  right common femoral artery 3.  Endovascular aneurysm repair with main body right 23 x 14 x 12 cm extended with 14 x 7 cm and contralateral limb left 12 x 14 cm extended with 10 x 7 cm 4.  Stent of left external iliac artery with 7 x 60 Innova 5.  Left lower extremity angiography  Day of Surgery   Doing well post op. Improved left lower extremity pain. Left groin incision is clean, dry and intact without hematoma. Right femoral access site clean and dry without hematoma. Bilateral lower extremities well perfused. Left foot cool with faint PT signals. Motor and sensation remain intact. Hemodynamically stable. Increase mobilization as tolerated   Karoline Caldwell, PA-C [facility] [phone] 01/15/2020 4:01 PM

## 2020-01-15 NOTE — Progress Notes (Signed)
Mobility Specialist - Progress Note   01/15/20 1723  Mobility  Activity Dangled on edge of bed  Level of Assistance Minimal assist, patient does 75% or more  Assistive Device None  Mobility performed by Mobility specialist  $Mobility charge 1 Mobility    Pre-mobility: 75 HR, 120/73 BP, 97% SpO2 During mobility: 76 HR, 128/83 BP, 98% SpO2 Post-mobility: 72 HR, 125/83 BP, 100% SPO2  Dangled pt on edge of bed, he did not want to stand due to muscle weakness. He denied any lightheadedness or dizziness while sitting up. He moved his legs around and put some downward pressure on his feet after my instruction. We then did two sets of 5 leg raises each leg and then stopped due to L leg muscle cramping. I left him lying in his bed with his call alarm and phone at his side.  [place] Specialist

## 2020-01-15 NOTE — Progress Notes (Addendum)
Pt CTA reviewed aneurysm is now 5 cm diameter and he has thrombosis of the left common iliac artery.  I suspect this is acute on chronic.  He has flow all the way to the ankle in the left leg.  There is no popliteal aneurysm.  He has intact runoff to the right leg.  Will keep NPO.  Will keep on heparin drip.  Plan for repair of AAA on 5/25 with left leg thrombectomy and possible fem fem by Dr Donzetta Matters later today.  Aneurysm has 4 cm neck.  It is 25 mm diameter. Renals and mesenterics are patent.  Common iliacs are ectactic to aneurymal at 2 cm. 12 cm from lowest renal to right iliac bifurcation. Right common femoral is 12 mm diameter.  Left reconstituted EIA is 8 mm   Ruta Hinds, MD [facility] [place] Office: [phone]

## 2020-01-15 NOTE — Anesthesia Postprocedure Evaluation (Signed)
Anesthesia Post Note  Patient: [name]  Procedure(s) Performed: ENDOVASCULAR ANEURYSM REPAIR (N/A ) INSERTION OF LEFT EXTERNAL ILIAC ARTERY STENT (Left ) THROMBECTOMY OF LEFT ILIAC ARTERY Left Lower Extremity Angiogram     Patient location during evaluation: PACU Anesthesia Type: General Level of consciousness: awake Pain management: pain level controlled Vital Signs Assessment: post-procedure vital signs reviewed and stable Respiratory status: spontaneous breathing, nonlabored ventilation, respiratory function stable and patient connected to nasal cannula oxygen Cardiovascular status: blood pressure returned to baseline and stable Postop Assessment: no apparent nausea or vomiting Anesthetic complications: no    Last Vitals:  Vitals:   01/15/20 1500 01/15/20 1525  BP: 103/64 116/65  Pulse:  74  Resp: 14 19  Temp:  36.8 C  SpO2: 98% 98%    Last Pain:  Vitals:   01/15/20 1525  TempSrc: Oral  PainSc:                  Maxen Rowland P Anguel Delapena

## 2020-01-15 NOTE — Progress Notes (Signed)
PT BACK FROM SURGERY.  NO S/S OF ANY ACUTE DISTRESS.  BIL. LE PULSES DOPPLERED.  BIL. GROIN SITES ARE LEVEL 0

## 2020-01-16 ENCOUNTER — Inpatient Hospital Stay (HOSPITAL_COMMUNITY): Payer: No Typology Code available for payment source

## 2020-01-16 ENCOUNTER — Encounter: Payer: Self-pay | Admitting: *Deleted

## 2020-01-16 DIAGNOSIS — R1032 Left lower quadrant pain: Secondary | ICD-10-CM

## 2020-01-16 DIAGNOSIS — I959 Hypotension, unspecified: Secondary | ICD-10-CM

## 2020-01-16 DIAGNOSIS — M79605 Pain in left leg: Secondary | ICD-10-CM

## 2020-01-16 DIAGNOSIS — I739 Peripheral vascular disease, unspecified: Secondary | ICD-10-CM

## 2020-01-16 DIAGNOSIS — I480 Paroxysmal atrial fibrillation: Secondary | ICD-10-CM

## 2020-01-16 LAB — BASIC METABOLIC PANEL
Anion gap: 10 (ref 5–15)
BUN: 8 mg/dL (ref 8–23)
CO2: 25 mmol/L (ref 22–32)
Calcium: 8.5 mg/dL — ABNORMAL LOW (ref 8.9–10.3)
Chloride: 101 mmol/L (ref 98–111)
Creatinine, Ser: 0.92 mg/dL (ref 0.61–1.24)
GFR calc Af Amer: >60 mL/min (ref >60)
GFR calc non Af Amer: >60 mL/min (ref >60)
Glucose, Bld: 120 mg/dL — ABNORMAL HIGH (ref 70–99)
Potassium: 3.3 mmol/L — ABNORMAL LOW (ref 3.5–5.1)
Sodium: 136 mmol/L (ref 135–145)

## 2020-01-16 LAB — CBC WITH DIFFERENTIAL/PLATELET
Abs Immature Granulocytes: 0.04 10*3/uL (ref 0.00–0.07)
Basophils Absolute: 0 10*3/uL (ref 0.0–0.1)
Basophils Relative: 0 %
Eosinophils Absolute: 0.1 10*3/uL (ref 0.0–0.5)
Eosinophils Relative: 1 %
HCT: 32.4 % — ABNORMAL LOW (ref 39.0–52.0)
Hemoglobin: 10.4 g/dL — ABNORMAL LOW (ref 13.0–17.0)
Immature Granulocytes: 1 %
Lymphocytes Relative: 21 %
Lymphs Abs: 1.5 10*3/uL (ref 0.7–4.0)
MCH: 28.7 pg (ref 26.0–34.0)
MCHC: 32.1 g/dL (ref 30.0–36.0)
MCV: 89.5 fL (ref 80.0–100.0)
Monocytes Absolute: 0.8 10*3/uL (ref 0.1–1.0)
Monocytes Relative: 10 %
Neutro Abs: 5.1 10*3/uL (ref 1.7–7.7)
Neutrophils Relative %: 67 %
Platelets: 131 10*3/uL — ABNORMAL LOW (ref 150–400)
RBC: 3.62 MIL/uL — ABNORMAL LOW (ref 4.22–5.81)
RDW: 12.5 % (ref 11.5–15.5)
WBC: 7.5 10*3/uL (ref 4.0–10.5)
nRBC: 0 % (ref 0.0–0.2)

## 2020-01-16 LAB — COMPREHENSIVE METABOLIC PANEL
ALT: 12 U/L (ref 0–44)
AST: 23 U/L (ref 15–41)
Albumin: 2.6 g/dL — ABNORMAL LOW (ref 3.5–5.0)
Alkaline Phosphatase: 42 U/L (ref 38–126)
Anion gap: 6 (ref 5–15)
BUN: 11 mg/dL (ref 8–23)
CO2: 24 mmol/L (ref 22–32)
Calcium: 8.1 mg/dL — ABNORMAL LOW (ref 8.9–10.3)
Chloride: 104 mmol/L (ref 98–111)
Creatinine, Ser: 1.08 mg/dL (ref 0.61–1.24)
GFR calc Af Amer: >60 mL/min (ref >60)
GFR calc non Af Amer: >60 mL/min (ref >60)
Glucose, Bld: 155 mg/dL — ABNORMAL HIGH (ref 70–99)
Potassium: 3.7 mmol/L (ref 3.5–5.1)
Sodium: 134 mmol/L — ABNORMAL LOW (ref 135–145)
Total Bilirubin: 0.7 mg/dL (ref 0.3–1.2)
Total Protein: 5 g/dL — ABNORMAL LOW (ref 6.5–8.1)

## 2020-01-16 LAB — CBC
HCT: 34.3 % — ABNORMAL LOW (ref 39.0–52.0)
Hemoglobin: 11.1 g/dL — ABNORMAL LOW (ref 13.0–17.0)
MCH: 28.7 pg (ref 26.0–34.0)
MCHC: 32.4 g/dL (ref 30.0–36.0)
MCV: 88.6 fL (ref 80.0–100.0)
Platelets: 144 10*3/uL — ABNORMAL LOW (ref 150–400)
RBC: 3.87 MIL/uL — ABNORMAL LOW (ref 4.22–5.81)
RDW: 12.5 % (ref 11.5–15.5)
WBC: 7.3 10*3/uL (ref 4.0–10.5)
nRBC: 0 % (ref 0.0–0.2)

## 2020-01-16 LAB — TROPONIN I (HIGH SENSITIVITY)
Troponin I (High Sensitivity): 17 ng/L (ref <18)
Troponin I (High Sensitivity): 21 ng/L — ABNORMAL HIGH (ref <18)

## 2020-01-16 LAB — GLUCOSE, CAPILLARY
Glucose-Capillary: 119 mg/dL — ABNORMAL HIGH (ref 70–99)
Glucose-Capillary: 129 mg/dL — ABNORMAL HIGH (ref 70–99)
Glucose-Capillary: 156 mg/dL — ABNORMAL HIGH (ref 70–99)
Glucose-Capillary: 175 mg/dL — ABNORMAL HIGH (ref 70–99)
Glucose-Capillary: 205 mg/dL — ABNORMAL HIGH (ref 70–99)

## 2020-01-16 LAB — MAGNESIUM: Magnesium: 1.9 mg/dL (ref 1.7–2.4)

## 2020-01-16 MED ORDER — IOHEXOL 350 MG/ML SOLN
100.0000 mL | Freq: Once | INTRAVENOUS | Status: AC | PRN
  Administered 2020-01-16: 100 mL via INTRAVENOUS

## 2020-01-16 MED ORDER — LEVALBUTEROL HCL 0.63 MG/3ML IN NEBU
0.6300 mg | INHALATION_SOLUTION | Freq: Four times a day (QID) | RESPIRATORY_TRACT | Status: DC | PRN
  Administered 2020-01-16: 0.63 mg via RESPIRATORY_TRACT

## 2020-01-16 MED ORDER — LEVALBUTEROL HCL 0.63 MG/3ML IN NEBU
0.6300 mg | INHALATION_SOLUTION | Freq: Four times a day (QID) | RESPIRATORY_TRACT | Status: DC
  Filled 2020-01-16: qty 3

## 2020-01-16 MED ORDER — LACTATED RINGERS IV SOLN
INTRAVENOUS | Status: DC
  Administered 2020-01-16: 50 mL/h via INTRAVENOUS

## 2020-01-16 MED ORDER — GABAPENTIN 600 MG PO TABS
600.0000 mg | ORAL_TABLET | Freq: Three times a day (TID) | ORAL | Status: DC
  Administered 2020-01-16 – 2020-01-18 (×8): 600 mg via ORAL
  Filled 2020-01-16 (×8): qty 1

## 2020-01-16 MED ORDER — ASPIRIN EC 81 MG PO TBEC
81.0000 mg | DELAYED_RELEASE_TABLET | Freq: Every day | ORAL | Status: DC
  Administered 2020-01-16 – 2020-01-22 (×7): 81 mg via ORAL
  Filled 2020-01-16 (×7): qty 1

## 2020-01-16 MED ORDER — SODIUM CHLORIDE 0.9 % IV BOLUS
1000.0000 mL | Freq: Once | INTRAVENOUS | Status: AC
  Administered 2020-01-16: 1000 mL via INTRAVENOUS

## 2020-01-16 NOTE — Progress Notes (Addendum)
  Progress Note    01/16/2020 7:50 AM 1 Day Post-Op  Subjective:  LLE rest pain this morning   Vitals:   01/16/20 0000 01/16/20 0440  BP: 112/60 106/61  Pulse:    Resp: 17 18  Temp:  98.6 F (37 C)  SpO2: 96% 97%   Physical Exam Lungs:  Non labored Incisions:  R groin cath site c/d/i without hematoma; L groin incision c/d/i Extremities:  Faintly palpable femoral pulses; L foot with only venous like PTA signal by doppler Abdomen:  Soft, NT, ND Neurologic: A&O  CBC    Component Value Date/Time   WBC 7.3 01/16/2020 0439   RBC 3.87 (L) 01/16/2020 0439   HGB 11.1 (L) 01/16/2020 0439   HCT 34.3 (L) 01/16/2020 0439   PLT 144 (L) 01/16/2020 0439   MCV 88.6 01/16/2020 0439   MCH 28.7 01/16/2020 0439   MCHC 32.4 01/16/2020 0439   RDW 12.5 01/16/2020 0439   LYMPHSABS 2.8 12/17/2019 1312   MONOABS 0.6 12/17/2019 1312   EOSABS 0.1 12/17/2019 1312   BASOSABS 0.0 12/17/2019 1312    BMET    Component Value Date/Time   NA 136 01/16/2020 0439   K 3.3 (L) 01/16/2020 0439   CL 101 01/16/2020 0439   CO2 25 01/16/2020 0439   GLUCOSE 120 (H) 01/16/2020 0439   BUN 8 01/16/2020 0439   CREATININE 0.92 01/16/2020 0439   CALCIUM 8.5 (L) 01/16/2020 0439   GFRNONAA >60 01/16/2020 0439   GFRAA >60 01/16/2020 0439    INR    Component Value Date/Time   INR 1.1 01/15/2020 1108     Intake/Output Summary (Last 24 hours) at 01/16/2020 0750 Last data filed at 01/16/2020 0200 Gross per 24 hour  Intake 3823.74 ml  Output 2400 ml  Net 1423.74 ml     Assessment/Plan:  74 y.o. male is s/p EVAR with LLE thrombectomy and EIA stenting 1 Day Post-Op   LLE rest pain; only faintly palpable femoral pulse and venous-like PTA signal by doppler Check L LE arterial duplex this morning   Dagoberto Ligas, PA-C [facility] [phone] 01/16/2020 7:50 AM  I have independently interviewed and examined patient and agree with PA assessment and plan above.  Patient having  pain in the left thigh left calf and foot.  Foot is warm.  He has a palpable popliteal pulse and a stable left PT signal although it is monophasic.  I do not think this is ischemic in nature given that his blood flow is significantly improved from preop.  This may be reperfusion although he does not have swelling in his compartments to suggest any compartment syndrome.  We will get a duplex of his left lower extremity.  Unfortunately his options for revascularization would likely require retrograde tibial access I would not want to do this unless was a last ditch effort.  Preesha Benjamin C. Donzetta Matters, MD [facility] [place] Office: [phone] Pager: [phone]

## 2020-01-16 NOTE — Significant Event (Signed)
Rapid Response Event Note  Called by nurse with concerns of patient's SBP being the 70s and patient having severe pain in the LLQ of the abdomen - radiating into the back.   Upon arrival, patient was lethargic - this is new for the patient, earlier when I saw him, despite being in AF RVR - he was completely awake/conversant, skin was warm and dry. Currently he is  hard to arouse, diaphoretic, and endorses being dizzy and lightheaded. HR was now in the 80s - NSR, lung sounds clear throughout, blood sugar > 100.  LLE - unchanged from earlier, groin site and LLQ extremely tender to touch. CARDS MD at bedside, concern for [facility], patient taken for STAT CT A/P and VVS MD paged. While in CT, SBP >100, but patient remained lethargic and was still having significant pain. VVS MD - Carlis Abbott came to the bedside, CT was reviewed.   Plan: -- STAT LABS -- LR at 50cc/hr  -- I/O if needed -- Strict I/Os  Call Time 1535 Arrival Time 1537 End Time [place], [place]

## 2020-01-16 NOTE — Progress Notes (Signed)
Changes in vitals/RR notified/MD notified Hypotensive, diaphoretic, lethargic, severe abdominal pain in LLQ, periodic lower back pain.   Bladder scan 307 ml & voided 240 ml. Transported to CT for abd/pelvis scan

## 2020-01-16 NOTE — Significant Event (Addendum)
Rapid Response Event Note  Overview: Cardiac - AF (coupled with SOB and Hypotension)  Initial Focused Assessment: Called to the bedside for patient's HR being irregular - EKG showed AF RVR. Upon arrival, patient's was awake and oriented but quite short of breath, RR 24-28 - mildly labored, 99% on RA, he is not in acute distress but has some mild accessory muscle use at times. Lung sounds - clear throughout, skin normal in all extremities except LLE - LLE is red, has blotchy patches, nurse was only able to doppler femoral pulses, patient has sensation in LLE but it is diminished when compared to the RLE, patient feels like his leg is heavier on the LT and has pain in the LLE (reperfusion?).   Interventions: -- STAT EKG - AF -- 1L NS bolus was already ordered  -- 500 cc NS bolus given in addition for soft BPs  -- Xopenox 0.63 mg NEB x 1 -- shortness of breath improved after neb  The nurse and I paged [facility] PA and MD several times - no response from providers - I paged the on call [facility] MD -- Early -- and obtained a verbal order for an inpatient cardiology consult and I paged the [facility] at [phone] and page was returned at [phone]  Plan of Care: -- Monitor and Trend VS after 1L NS bolus -- Strict I/Os -- Avoid albuterol, caffeine, and other agents that might increase HR -- Please order CPAP for bedtime as well - patient does use a CPAP at home -- Rest per MD   Event Summary:  Call Time 1149 Arrival Time 1154 End Time 1340  PATEL, PUJA R

## 2020-01-16 NOTE — Progress Notes (Signed)
New onset sinus tach sustained 140's. Patient denies dizziness/ SOB/ palpitations/ nausea c/o sharp pain in back before he left to ultrasound and intermittent L hip pain.   Left leg blotchy red and warm and states it has been like that since this am.  Oral temp 97.5 C/o itching all over.  Not physically scratching at this time Metoprolol 2 mg given.  Mildly effective w/ HR 120's - 140's PA notified.  New orders bolus 1000 cc NS RR notified/charge notified Anson Crofts, RN

## 2020-01-16 NOTE — Progress Notes (Signed)
Left lower extremity arterial duplex has been completed. Preliminary results can be found in [facility] Proc through chart review.   01/16/20 9:37 AM [name] RVT

## 2020-01-16 NOTE — Consult Note (Signed)
Cardiology Consultation:   Patient ID: Aahan Marques MRN: [id number]; DOB: 26-Apr-1946  Admit date: 01/14/2020 Date of Consult: 01/16/2020  Primary Care Provider: [facility] Primary Cardiologist: Larae Grooms, MD   Patient Profile:   Lyfe Monger is a 74 y.o. male with a hx of CAD s/p PCI to RCA in 2006, diabetes mellitus, chronic shortness of breath due to COPD, OSA on CPAP asthma hypertension, infrarenal abdominal aortic aneurysm, lung cancer, ongoing tobacco smoker and peripheral vascular disease who is being seen today for the evaluation of atrial fibrillation with rapid ventricular rate at the request of Dr. Oneida Alar.  Last cardiac cath in 2017 showed patent stent without obstructive disease.  He was diagnosed with left lung cancer in February 2020.  Offered operation followed by chemo but declined.  Last seen by Dr. Irish Lack 12/13/2019.  History of Present Illness:   Mr. Wickstrom dealing with left foot pain for past 3 to 4 months.  He had night time pain which woke him up from sleep.  Previously evaluated by multiple providers and felt arthritis in nature.  On gabapentin without relief.  Due to worsening left lower extremity pain he went to urgent care where lower extremity Doppler ruled out DVT however noted to have occluded left posterior tibial artery.  A limited vascular study was performed and found to have only left monophasic femoral artery pulse and was concern for possible left iliac occlusion.  Patient was sent to the ER for vascular evaluation.  CT angio showed increased infrarenal aortic aneurysm to 5 cm and occluded left common iliac artery.  He underwent surgery yesterday  Procedure Performed: 1.  Open exposure of left common femoral artery with left common external iliac thromboembolectomy 2.  Percutaneous access closure right common femoral artery 3.  Endovascular aneurysm repair with main body right 23 x 14 x 12 cm extended with 14 x 7 cm and contralateral limb  left 12 x 14 cm extended with 10 x 7 cm 4.  Stent of left external iliac artery with 7 x 60 Innova 5.  Left lower extremity angiography  Early morning he suddenly became dyspnea and went into afib RVR. He was hypotensive. He received total 1500 cc bolus of NS and IV metoprolol 2mg . He converted to sinus rhythm. He has abdominal and left groin pain.   Past Medical History:  Diagnosis Date  . Asthma   . Coronary artery disease   . Diabetes mellitus without complication ([place])   . History of kidney stones   . Hypertension   . Peripheral vascular disease [facility])     Past Surgical History:  Procedure Laterality Date  . CARDIAC CATHETERIZATION N/A 07/05/2016   Procedure: Left Heart Cath and Coronary Angiography;  Surgeon: Sherren Mocha, MD;  Location: [facility];  Service: Cardiovascular;  Laterality: N/A;  . CORONARY ANGIOPLASTY    . heart stents  2006     Inpatient Medications: Scheduled Meds: . arformoterol  15 mcg Nebulization BID  . aspirin EC  81 mg Oral Daily  . buPROPion  150 mg Oral Daily  . Chlorhexidine Gluconate Cloth  6 each Topical Daily  . cholecalciferol  1,000 Units Oral Daily  . clopidogrel  75 mg Oral Daily  . docusate sodium  100 mg Oral Daily  . doxazosin  1 mg Oral Daily  . finasteride  5 mg Oral Daily  . gabapentin  600 mg Oral TID  . heparin  5,000 Units Subcutaneous Q8H  . insulin aspart  0-15  Units Subcutaneous TID WC  . isosorbide mononitrate  30 mg Oral Daily  . lisinopril  10 mg Oral Daily  . loratadine  10 mg Oral Daily  . pantoprazole  40 mg Oral Daily  . polyvinyl alcohol  1-2 drop Both Eyes Q M,W,F  . rosuvastatin  20 mg Oral q1800  . tamsulosin  0.4 mg Oral QHS   Continuous Infusions: . sodium chloride    . magnesium sulfate bolus IVPB     PRN Meds: sodium chloride, acetaminophen **OR** acetaminophen, alum & mag hydroxide-simeth, guaiFENesin-dextromethorphan, hydrALAZINE, labetalol, levalbuterol, magnesium sulfate bolus IVPB,  meclizine, metoprolol tartrate, morphine injection, nitroGLYCERIN, ondansetron, oxyCODONE-acetaminophen, phenol, potassium chloride  Allergies:    Allergies  Allergen Reactions  . Erythromycin Shortness Of Breath and Swelling  . Simvastatin     CAUSE MUSCLE PAIN,.  . Statins Other (See Comments)    Muscle pain    Social History:   Social History   Socioeconomic History  . Marital status: Married    Spouse name: Not on file  . Number of children: Not on file  . Years of education: Not on file  . Highest education level: Not on file  Occupational History  . Not on file  Tobacco Use  . Smoking status: Former Research scientist (life sciences)  . Smokeless tobacco: Never Used  Substance and Sexual Activity  . Alcohol use: No    Alcohol/week: 0.0 standard drinks  . Drug use: No  . Sexual activity: Not on file  Other Topics Concern  . Not on file  Social History Narrative  . Not on file   Social Determinants of Health   Financial Resource Strain:   . Difficulty of Paying Living Expenses:   Food Insecurity:   . Worried About Charity fundraiser in the Last Year:   . Arboriculturist in the Last Year:   Transportation Needs:   . Film/video editor (Medical):   Marland Kitchen Lack of Transportation (Non-Medical):   Physical Activity:   . Days of Exercise per Week:   . Minutes of Exercise per Session:   Stress:   . Feeling of Stress :   Social Connections:   . Frequency of Communication with Friends and Family:   . Frequency of Social Gatherings with Friends and Family:   . Attends Religious Services:   . Active Member of Clubs or Organizations:   . Attends Archivist Meetings:   Marland Kitchen Marital Status:   Intimate Partner Violence:   . Fear of Current or Ex-Partner:   . Emotionally Abused:   Marland Kitchen Physically Abused:   . Sexually Abused:     Family History:   Family History  Problem Relation Age of Onset  . CAD Mother   . CAD Brother      ROS:  Please see the history of present illness.  All  other ROS reviewed and negative.     Physical Exam/Data:   Vitals:   01/16/20 1330 01/16/20 1335 01/16/20 1442 01/16/20 1514  BP: (!) 84/53 110/86  (!) 74/49  Pulse:    71  Resp: (!) 27 (!) 25 19 19   Temp:    98 F (36.7 C)  TempSrc:    Oral  SpO2: 94% 96%  95%  Weight:      Height:        Intake/Output Summary (Last 24 hours) at 01/16/2020 1521 Last data filed at 01/16/2020 0440 Gross per 24 hour  Intake 2197.07 ml  Output 1425 ml  Net  772.07 ml   Last 3 Weights 01/15/2020 01/14/2020 12/18/2019  Weight (lbs) 200 lb 13.4 oz 214 lb 214 lb  Weight (kg) 91.1 kg 97.07 kg 97.07 kg     Body mass index is 25.79 kg/m.  General:  Ill appearing male in no acute distress HEENT: normal Lymph: no adenopathy Neck: no JVD Endocrine:  No thryomegaly Vascular: No carotid bruits; Weak LLE pulse, tender L groin  Cardiac:  normal S1, S2; RRR; no murmur Lungs:  clear to auscultation bilaterally, no wheezing, rhonchi or rales  Abd: soft, + tender, Ext: no edema Musculoskeletal:  No deformities, BUE and BLE strength normal and equal Skin: warm and dry  Neuro:  CNs 2-12 intact, no focal abnormalities noted Psych:  Normal affect   EKG:  The EKG was personally reviewed and demonstrates: Atrial fibrillation at rate of 144 bpm and ST depression laterally Telemetry:  Telemetry was personally reviewed and demonstrates: Sinus rhythm with episode of atrial fibrillation with rapid ventricular rate this morning/early afternoon, currently maintaining sinus rhythm  Relevant CV Studies: Left Heart Cath and Coronary Angiography 06/2016  Conclusion  1. Mild nonobstructive coronary artery disease affecting the left main, LAD, and left circumflex 2. Continued patency of the stented segment in the mid right coronary artery with mild to moderate diffuse narrowing in a relatively small RCA. 3. Preserved LV systolic function with normal LVEDP  Recommendations: Would continue with medical therapy    Diagnostic Dominance: Right    Laboratory Data:   Chemistry Recent Labs  Lab 01/15/20 0601 01/15/20 1108 01/16/20 0439  NA 135 138 136  K 3.7 3.9 3.3*  CL 102 108 101  CO2 26 24 25   GLUCOSE 130* 171* 120*  BUN 14 13 8   CREATININE 1.03 1.01 0.92  CALCIUM 8.9 8.4* 8.5*  GFRNONAA >60 >60 >60  GFRAA >60 >60 >60  ANIONGAP 7 6 10     Hematology Recent Labs  Lab 01/15/20 0601 01/15/20 1108 01/16/20 0439  WBC 5.8 9.1 7.3  RBC 4.50 4.27 3.87*  HGB 12.9* 12.2* 11.1*  HCT 39.9 38.3* 34.3*  MCV 88.7 89.7 88.6  MCH 28.7 28.6 28.7  MCHC 32.3 31.9 32.4  RDW 12.5 12.5 12.5  PLT 165 158 144*   Radiology/Studies:  CT Angio Aortobifemoral W and/or Wo Contrast  Result Date: 01/15/2020 CLINICAL DATA:  Ischemic left leg. Rule out femoral artery occlusion. EXAM: CT ANGIOGRAPHY OF ABDOMINAL AORTA WITH ILIOFEMORAL RUNOFF TECHNIQUE: Multidetector CT imaging of the abdomen, pelvis and lower extremities was performed using the standard protocol during bolus administration of intravenous contrast. Multiplanar CT image reconstructions and MIPs were obtained to evaluate the vascular anatomy. CONTRAST:  38mL OMNIPAQUE IOHEXOL 350 MG/ML SOLN COMPARISON:  Abdominal CTA 07/03/2016 FINDINGS: VASCULAR Aorta: Infrarenal aortic aneurysm measuring 5.0 x 4.2 cm. Slight increased size from 4.5 x 4.4 cm. Eccentric mural thrombus causes greater than 50% luminal narrowing in the distal abdominal aorta. No periaortic stranding. Aneurysm arises approximately 4.3 cm distal to the right renal artery. No dissection. Celiac: Mild stenosis at the origin.  Distal branches are patent. SMA: Approximately 50% narrowing at the origin by noncalcified plaque. No dissection. The distal branch vessels are patent. Renals: Patent without severe stenosis, dissection, or acute findings. IMA: Only minimal thready flow proximally. Significantly small in caliber. RIGHT Lower Extremity Inflow: Calcified noncalcified plaque in the common  iliac artery causes less than 50% luminal stenosis. There is narrowing at the origin of the right internal iliac artery of approximately 50% by noncalcified plaque.  Right external iliac artery is patent without severe stenosis. Outflow: Common, superficial and profunda femoral arteries are patent without evidence of aneurysm, dissection, vasculitis. Short segment luminal narrowing in the distal superficial femoral artery causes approximately 50% luminal narrowing,, series 5 image 325. short segment dissection of the proximal popliteal artery which is likely chronic and partially thrombosed, series 5, image 431. Runoff: There is 2 vessel runoff to the ankle. Anterior tibial artery is diminutive in caliber and thready flow past the proximal calf. LEFT Lower Extremity Inflow: The left common iliac artery is occluded at its origin. The internal iliac artery is reconstituted via collaterals. The external iliac artery is occluded. There is reconstitution of flow just proximal to the inguinal ligament. Outflow: Common iliac arteries patent but small in caliber. The profundus femoral artery is patent but small in caliber. The superficial femoral artery is small in caliber and diffusely irregular. Short segment severe narrowing in the mid proximal superficial femoral artery, series 5, image 87. The more distal superficial femoral artery is diffusely irregular and small in caliber. Greater than 50% luminal narrowing of the popliteal artery which remains patent. Runoff: Calf vessels are diffusely small in caliber. There is flow within the anterior tibial and proximal tibioperoneal trunk. Thready flow is seen within the calf vessels, which intermittently reconstitutes at the level of the distal calf, however no definite vessel opacification at the ankle. Veins: No obvious venous abnormality within the limitations of this arterial phase study. Review of the MIP images confirms the above findings. Review of electronic medical  records reveals vascular surgery has reviewed the exam and is aware of the above findings. NON-VASCULAR Lower chest: Small bleb in the right middle lobe. No acute airspace disease or pleural effusion. Hepatobiliary: No focal liver abnormality is seen. No gallstones, gallbladder wall thickening, or biliary dilatation. Pancreas: No ductal dilatation or inflammation. Spleen: Normal in size without focal abnormality. Adrenals/Urinary Tract: No adrenal nodule. No hydronephrosis. No significant perinephric edema. Urinary bladder is unremarkable. Stomach/Bowel: Nondistended stomach. No evidence of bowel inflammation or obstruction. Normal appendix. Lymphatic: No adenopathy. Reproductive: Prostate is unremarkable. Other: No ascites or free fluid.  No free air. Musculoskeletal: Degenerative change in the lumbar spine. There are no acute or suspicious osseous abnormalities. Mild bilateral degenerative change of the knee. There is mild circumferential soft tissue edema about the left ankle. IMPRESSION: VASCULAR 1. Infrarenal aortic aneurysm maximal dimension 5 cm, increased from 4.5 cm in 2017. No evidence of rupture. Moderate eccentric mural thrombus within the aneurysm sac. 2. Occluded left common iliac artery with distal reconstitution. 3. Peripheral vascular disease to the left lower extremity with vessel irregularity and areas of luminal narrowing. There is three-vessel runoff to the mid calf, with thready flow distally. 4. Peripheral vascular disease to the right lower extremity with areas of luminal narrowing and stenosis. 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 There is 2 vessel runoff to the ankle on the right. NON-VASCULAR No acute nonvascular findings. Electronically Signed   By: Keith Rake M.D.   On: 01/15/2020 00:39   DG Chest Port 1 View  Result Date: 01/15/2020 CLINICAL DATA:  74 year old male with shortness of breath status post AAA repair postoperative  day zero. Shortness of breath. EXAM: PORTABLE CHEST 1 VIEW COMPARISON:  Portable chest 0046 hours today. CTA abdomen and pelvis yesterday did not include the level of the left hilum. FINDINGS: Portable AP supine view at 1040 hours. Continued roughly 6.5 cm masslike opacity at the left  hilum, although perhaps slightly smaller and less linear along its peripheral margins since 0046 hours today. Stable lung volumes. Stable mediastinal contours. Outside of the left hilum Allowing for portable technique the lungs are clear. No pneumothorax or pleural effusion is evident on this supine view. Visualized tracheal air column is within normal limits. IMPRESSION: 1. Continued indeterminate left hilar masslike opacity, 6.5 cm. Severe mucoid impaction is possible although follow-up Chest CT (IV contrast preferred) is necessary to exclude bronchogenic carcinoma. 2. No new cardiopulmonary abnormality. Electronically Signed   By: Genevie Ann M.D.   On: 01/15/2020 10:53   DG Chest Port 1 View  Result Date: 01/15/2020 CLINICAL DATA:  Preoperative for cardiovascular exam EXAM: PORTABLE CHEST 1 VIEW COMPARISON:  Radiograph 07/02/2016 FINDINGS: Multilobular opacity projects over the left suprahilar region. The configuration of this opacities highly suspicious for mucoid impaction with a so-called finger in glove appearance. Hilar vessels are seen projecting through the opacity as well further supporting an airway process. No other opacities seen in the lungs. No pneumothorax or effusion. The cardiomediastinal contours are unremarkable. No acute osseous or soft tissue abnormality. IMPRESSION: Irregular lobular a opacity in the left upper lung with a configuration suspicious for mucoid impaction. Consider cross-sectional imaging. Electronically Signed   By: Lovena Le M.D.   On: 01/15/2020 01:08   VAS [place] LOWER EXTREMITY ARTERIAL DUPLEX  Result Date: 01/16/2020 LOWER EXTREMITY ARTERIAL DUPLEX STUDY Indications: Rest pain, and  peripheral artery disease. High Risk Factors: Hypertension, Diabetes.  Vascular Interventions: 01/15/2020 - 1. Open exposure of left common femoral                         artery with left common external iliac                         thromboembolectomy                         2. Percutaneous access closure right common femoral                         artery                         3. Endovascular aneurysm repair with main body right 23                         x 14 x 12 cm extended with 14 x 7 cm and contralateral                         limb left 12 x 14 cm extended with 10 x 7 cm                         4. Stent of left external iliac artery with 7 x 60                         Innova                         5. Left lower extremity angiography. Current ABI: Limitations: patient pain tolerance Comparison Study: No prior studies. Performing Technologist: Oliver Hum RVT  Examination Guidelines: A complete evaluation includes B-mode imaging, spectral  Doppler, color Doppler, and power Doppler as needed of all accessible portions of each vessel. Bilateral testing is considered an integral part of a complete examination. Limited examinations for reoccurring indications may be performed as noted.  +-----------+--------+-----+---------------+----------+--------+ LEFT       PSV cm/sRatioStenosis       Waveform  Comments +-----------+--------+-----+---------------+----------+--------+ CIA Distal 247                         monophasic         +-----------+--------+-----+---------------+----------+--------+ EIA Distal 258                         monophasic         +-----------+--------+-----+---------------+----------+--------+ CFA Mid    197                         monophasic         +-----------+--------+-----+---------------+----------+--------+ DFA        114                         monophasic         +-----------+--------+-----+---------------+----------+--------+ SFA Prox   131                          monophasic         +-----------+--------+-----+---------------+----------+--------+ SFA Mid    329          50-74% stenosismonophasic         +-----------+--------+-----+---------------+----------+--------+ SFA Distal 114                         monophasic         +-----------+--------+-----+---------------+----------+--------+ POP Prox   53                          monophasic         +-----------+--------+-----+---------------+----------+--------+ POP Distal 111                         monophasic         +-----------+--------+-----+---------------+----------+--------+ ATA Prox   48                          monophasic         +-----------+--------+-----+---------------+----------+--------+ ATA Mid    62                          monophasic         +-----------+--------+-----+---------------+----------+--------+ ATA Distal 16                          monophasic         +-----------+--------+-----+---------------+----------+--------+ PTA Prox   83                          monophasic         +-----------+--------+-----+---------------+----------+--------+ PTA Mid    46                          monophasic         +-----------+--------+-----+---------------+----------+--------+ PTA Distal 29  monophasic         +-----------+--------+-----+---------------+----------+--------+ PERO Prox  32                          monophasic         +-----------+--------+-----+---------------+----------+--------+ PERO Mid   26                          monophasic         +-----------+--------+-----+---------------+----------+--------+ PERO Distal22                          monophasic         +-----------+--------+-----+---------------+----------+--------+  Summary: Right: 50-74% stenosis noted in the mid superficial femoral artery. Monophasic waveforms are noted throughout the left lower arterial  system.  See table(s) above for measurements and observations.    Preliminary    VAS [place] LOWER EXTREMITY VENOUS (DVT)  Result Date: 01/14/2020  Lower Venous DVTStudy Indications: Pain.  Limitations: Guarding secondary to pain. Comparison Study: No prior study Performing Technologist: Abram Sander RVS Supporting Technologist: Maudry Mayhew MHA, RDMS, RVT, RDCS  Examination Guidelines: A complete evaluation includes B-mode imaging, spectral Doppler, color Doppler, and power Doppler as needed of all accessible portions of each vessel. Bilateral testing is considered an integral part of a complete examination. Limited examinations for reoccurring indications may be performed as noted. The reflux portion of the exam is performed with the patient in reverse Trendelenburg.  +-----+---------------+---------+-----------+----------+--------------+ RIGHTCompressibilityPhasicitySpontaneityPropertiesThrombus Aging +-----+---------------+---------+-----------+----------+--------------+ CFV  Full           Yes      Yes                                 +-----+---------------+---------+-----------+----------+--------------+   +---------+---------------+---------+-----------+----------+--------------+ LEFT     CompressibilityPhasicitySpontaneityPropertiesThrombus Aging +---------+---------------+---------+-----------+----------+--------------+ CFV      Full           Yes      Yes                                 +---------+---------------+---------+-----------+----------+--------------+ SFJ      Full                                                        +---------+---------------+---------+-----------+----------+--------------+ FV Prox  Full                                                        +---------+---------------+---------+-----------+----------+--------------+ FV Mid   Full                                                         +---------+---------------+---------+-----------+----------+--------------+ FV DistalFull                                                        +---------+---------------+---------+-----------+----------+--------------+  PFV      Full                                                        +---------+---------------+---------+-----------+----------+--------------+ POP      Full           Yes      Yes                                 +---------+---------------+---------+-----------+----------+--------------+ PTV      Full                                                        +---------+---------------+---------+-----------+----------+--------------+   Left Technical Findings: Not visualized segments include Peroneal veins.   Summary: RIGHT: - No evidence of common femoral vein obstruction.  LEFT: - There is no evidence of deep vein thrombosis in the lower extremity. However, portions of this examination were limited- see technologist comments above.  - No cystic structure found in the popliteal fossa.  Incidental finding: the posterior tibial artery appears to be occluded. Given this finding, limited proximal interrogation was performed, and the left common femoral artery appears to have a dampened monophasic waveform. Right common femoral artery exhibits a triphasic waveform. 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  *See table(s) above for measurements and observations. Electronically signed by Ruta Hinds MD on 01/14/2020 at 6:30:18 PM.    Final    HYBRID OR IMAGING ([employer])  Result Date: 01/15/2020 There is no interpretation for this exam.  This order is for images obtained during a surgical procedure.  Please See "Surgeries" Tab for more information regarding the procedure.    Assessment and Plan:   1.  Atrial fibrillation with rapid ventricular rate He converted to sinus rhythm eventually IV metoprolol 2 mg. CHADSVASCs score of 4.  No rate control  agent given soft blood pressure and anticoagulation until rule out bleeding.  2.  Hypotension/ L groin and abdominal pain s/p vascular surgery yesterday  - R/o retroperitoneal bleed -CT angio of abdominal pelvis  3.  CAD -No angina.  EKG showed ST depression laterally while in A. fib RVR.  4. Hypokalemia 5. Anemia - Hgb 15.3 on on 5/24>>> today 11.1 - R/o retroperitoneal bleed   For questions or updates, please contact [facility] Please consult [email or web address] for contact info under     Jarrett Soho, PA  01/16/2020 3:21 PM

## 2020-01-16 NOTE — Progress Notes (Signed)
Mobility Specialist - Progress Note   01/16/20 1518  Mobility  Activity  (Cancellation)  Mobility performed by Mobility specialist  $Mobility charge 1 Mobility   Did not see pt today due to discussion w/ nurse about Afib.   [place] Specialist

## 2020-01-17 ENCOUNTER — Inpatient Hospital Stay (HOSPITAL_COMMUNITY): Payer: No Typology Code available for payment source

## 2020-01-17 DIAGNOSIS — I4891 Unspecified atrial fibrillation: Secondary | ICD-10-CM

## 2020-01-17 DIAGNOSIS — Z9889 Other specified postprocedural states: Secondary | ICD-10-CM

## 2020-01-17 DIAGNOSIS — Z8679 Personal history of other diseases of the circulatory system: Secondary | ICD-10-CM

## 2020-01-17 LAB — GLUCOSE, CAPILLARY
Glucose-Capillary: 180 mg/dL — ABNORMAL HIGH (ref 70–99)
Glucose-Capillary: 164 mg/dL — ABNORMAL HIGH (ref 70–99)
Glucose-Capillary: 164 mg/dL — ABNORMAL HIGH (ref 70–99)
Glucose-Capillary: 152 mg/dL — ABNORMAL HIGH (ref 70–99)

## 2020-01-17 LAB — CBC
HCT: 32.8 % — ABNORMAL LOW (ref 39.0–52.0)
Hemoglobin: 10.7 g/dL — ABNORMAL LOW (ref 13.0–17.0)
MCH: 29.1 pg (ref 26.0–34.0)
MCHC: 32.6 g/dL (ref 30.0–36.0)
MCV: 89.1 fL (ref 80.0–100.0)
Platelets: 133 10*3/uL — ABNORMAL LOW (ref 150–400)
RBC: 3.68 MIL/uL — ABNORMAL LOW (ref 4.22–5.81)
RDW: 12.5 % (ref 11.5–15.5)
WBC: 7.6 10*3/uL (ref 4.0–10.5)
nRBC: 0 % (ref 0.0–0.2)

## 2020-01-17 LAB — HEPARIN LEVEL (UNFRACTIONATED): Heparin Unfractionated: 0.39 IU/mL (ref 0.30–0.70)

## 2020-01-17 LAB — ECHOCARDIOGRAM COMPLETE
Height: 74 in
Weight: 3213.42 oz

## 2020-01-17 MED ORDER — HEPARIN (PORCINE) 25000 UT/250ML-% IV SOLN
1350.0000 [IU]/h | INTRAVENOUS | Status: DC
  Administered 2020-01-17 – 2020-01-18 (×2): 1100 [IU]/h via INTRAVENOUS
  Administered 2020-01-19: 1250 [IU]/h via INTRAVENOUS
  Administered 2020-01-20: 1350 [IU]/h via INTRAVENOUS
  Filled 2020-01-17 (×3): qty 250

## 2020-01-17 MED ORDER — HEPARIN (PORCINE) 25000 UT/250ML-% IV SOLN
INTRAVENOUS | Status: AC
  Filled 2020-01-17: qty 250

## 2020-01-17 MED ORDER — HEPARIN BOLUS VIA INFUSION
3000.0000 [IU] | Freq: Once | INTRAVENOUS | Status: AC
  Administered 2020-01-17: 3000 [IU] via INTRAVENOUS
  Filled 2020-01-17: qty 3000

## 2020-01-17 NOTE — Evaluation (Signed)
Physical Therapy Evaluation Patient Details Name: [name] MRN: [id number] DOB: [birth year] Today's Date: 01/17/2020   History of Present Illness  Pt adm 5/24 with 3 month history of worsening lt foot pain. Pt found to have acute on chronic ischemic LLE. Pt also with know AAA. On 5/25 pt underwent lt femoral and external iliac thrombo embolectomy, stent of lt external iliac artery and endovascular aneurysm repair. On 5/26 pt developed afib with rvr and hypotension. PMH - CAD, DM, HTN, PVD, asthma, AAA  Clinical Impression  Pt presents to PT with very limited mobility due to lt foot pain and weakness due to inactivity. Pt able to only amb a few feet due to foot pain and weakness. Recommend ST-SNF and pt agreeable. Expect pt will make steady progress as pain improves. Instructed pt in quad sets, ankle pumps, and straight leg raises.     Follow Up Recommendations SNF    Equipment Recommendations  Rolling walker with 5" wheels    Recommendations for Other Services       Precautions / Restrictions Precautions Precautions: Fall Restrictions Weight Bearing Restrictions: No      Mobility  Bed Mobility Overal bed mobility: Needs Assistance Bed Mobility: Supine to Sit     Supine to sit: Supervision;HOB elevated     General bed mobility comments: Incr time and effort  Transfers Overall transfer level: Needs assistance Equipment used: Rolling walker (2 wheeled) Transfers: Sit to/from Stand Sit to Stand: Mod assist         General transfer comment: Assist to bring hips up and for balance. Verbal cues for hand placement  Ambulation/Gait Ambulation/Gait assistance: Min assist Gait Distance (Feet): 5 Feet(forward and back) Assistive device: Rolling walker (2 wheeled) Gait Pattern/deviations: Step-to pattern;Decreased step length - right;Decreased step length - left;Decreased stance time - left;Antalgic Gait velocity: decr Gait velocity interpretation: <1.31 ft/sec, indicative  of household ambulator General Gait Details: Assist for balance and support. Pt limited by pain  Stairs            Wheelchair Mobility    Modified Rankin (Stroke Patients Only)       Balance Overall balance assessment: Needs assistance Sitting-balance support: No upper extremity supported;Feet supported Sitting balance-Leahy Scale: Fair     Standing balance support: Bilateral upper extremity supported Standing balance-Leahy Scale: Poor Standing balance comment: walker and min assist for static standing. Pt very tremulous                             Pertinent Vitals/Pain Pain Assessment: 0-10 Pain Score: 7  Pain Location: lt great toe Pain Descriptors / Indicators: Grimacing;Guarding;Sharp Pain Intervention(s): Limited activity within patient's tolerance;Monitored during session;Repositioned    Home Living Family/patient expects to be discharged to:: Private residence Living Arrangements: Spouse/significant other Available Help at Discharge: Family;Available 24 hours/day Type of Home: House Home Access: Stairs to enter Entrance Stairs-Rails: None(front entry has rails but 6-7 steps) Entrance Stairs-Number of Steps: 2-3 Home Layout: One level Home Equipment: Cane - single point      Prior Function Level of Independence: Needs assistance   Gait / Transfers Assistance Needed: modified indpendent with cane in home. Activity decr in past weeks.           Hand Dominance        Extremity/Trunk Assessment   Upper Extremity Assessment Upper Extremity Assessment: Generalized weakness    Lower Extremity Assessment Lower Extremity Assessment: Generalized weakness;LLE deficits/detail LLE Deficits / Details:  limited by pain       Communication   Communication: No difficulties  Cognition Arousal/Alertness: Awake/alert Behavior During Therapy: WFL for tasks assessed/performed Overall Cognitive Status: Within Functional Limits for tasks assessed                                         General Comments General comments (skin integrity, edema, etc.): VSS    Exercises     Assessment/Plan    PT Assessment Patient needs continued PT services  PT Problem List Decreased strength;Decreased activity tolerance;Decreased balance;Decreased mobility;Pain       PT Treatment Interventions DME instruction;Gait training;Functional mobility training;Therapeutic activities;Therapeutic exercise;Balance training;Patient/family education    PT Goals (Current goals can be found in the Care Plan section)  Acute Rehab PT Goals Patient Stated Goal: return homeq PT Goal Formulation: With patient/family Time For Goal Achievement: 01/31/20 Potential to Achieve Goals: Good    Frequency Min 3X/week   Barriers to discharge Inaccessible home environment stairs to enter    Co-evaluation               AM-PAC PT "6 Clicks" Mobility  Outcome Measure Help needed turning from your back to your side while in a flat bed without using bedrails?: None Help needed moving from lying on your back to sitting on the side of a flat bed without using bedrails?: A Little Help needed moving to and from a bed to a chair (including a wheelchair)?: A Lot Help needed standing up from a chair using your arms (e.g., wheelchair or bedside chair)?: A Lot Help needed to walk in hospital room?: A Little Help needed climbing 3-5 steps with a railing? : Total 6 Click Score: 15    End of Session Equipment Utilized During Treatment: Gait belt Activity Tolerance: Patient limited by pain Patient left: in chair;with call bell/phone within reach;with family/visitor present Nurse Communication: Mobility status PT Visit Diagnosis: Unsteadiness on feet (R26.81);Other abnormalities of gait and mobility (R26.89);Pain Pain - Right/Left: Left Pain - part of body: Ankle and joints of foot    Time: 1530-1604 PT Time Calculation (min) (ACUTE ONLY): 34  min   Charges:   PT Evaluation $PT Eval Moderate Complexity: 1 Mod PT Treatments $Gait Training: 8-22 mins        [place] Pager [phone] Office [place] 01/17/2020, 4:59 PM

## 2020-01-17 NOTE — Progress Notes (Signed)
Progress Note  Patient Name: [name] Date of Encounter: 01/17/2020  Primary Cardiologist: Larae Grooms, MD   Subjective   Feeling much better than yesterday. BP has stabilized. Continues to have sharp, shooting pains down left leg. Vascular following. Site looks stable   Inpatient Medications    Scheduled Meds: . arformoterol  15 mcg Nebulization BID  . aspirin EC  81 mg Oral Daily  . buPROPion  150 mg Oral Daily  . cholecalciferol  1,000 Units Oral Daily  . clopidogrel  75 mg Oral Daily  . docusate sodium  100 mg Oral Daily  . doxazosin  1 mg Oral Daily  . finasteride  5 mg Oral Daily  . gabapentin  600 mg Oral TID  . heparin  5,000 Units Subcutaneous Q8H  . insulin aspart  0-15 Units Subcutaneous TID WC  . isosorbide mononitrate  30 mg Oral Daily  . lisinopril  10 mg Oral Daily  . loratadine  10 mg Oral Daily  . pantoprazole  40 mg Oral Daily  . polyvinyl alcohol  1-2 drop Both Eyes Q M,W,F  . rosuvastatin  20 mg Oral q1800  . tamsulosin  0.4 mg Oral QHS   Continuous Infusions: . sodium chloride    . lactated ringers 50 mL/hr at 01/17/20 0700  . magnesium sulfate bolus IVPB     PRN Meds: sodium chloride, acetaminophen **OR** acetaminophen, alum & mag hydroxide-simeth, guaiFENesin-dextromethorphan, hydrALAZINE, labetalol, levalbuterol, magnesium sulfate bolus IVPB, meclizine, metoprolol tartrate, morphine injection, nitroGLYCERIN, ondansetron, oxyCODONE-acetaminophen, phenol, potassium chloride   Vital Signs    Vitals:   01/17/20 0100 01/17/20 0200 01/17/20 0300 01/17/20 0402  BP: 104/81 123/63 (!) 111/52 120/63  Pulse:    75  Resp: (!) 23 17 19 17   Temp:    98.3 F (36.8 C)  TempSrc:    Oral  SpO2: 91% 98% 95% 96%  Weight:      Height:        Intake/Output Summary (Last 24 hours) at 01/17/2020 0749 Last data filed at 01/17/2020 0740 Gross per 24 hour  Intake 1181.6 ml  Output 1610 ml  Net -428.4 ml   Filed Weights   01/14/20 2120  01/15/20 0243  Weight: 97.1 kg 91.1 kg    Physical Exam   General: Well developed, well nourished, NAD Neck: Negative for carotid bruits. No JVD Lungs:Clear to ausculation bilaterally. No wheezes, rales, or rhonchi. Breathing is unlabored. Cardiovascular: RRR with S1 S2. No murmurs Abdomen: Soft, non-tender, non-distended Extremities: No edema. Neuro: Alert and oriented. No focal deficits. No facial asymmetry. MAE spontaneously. Psych: Responds to questions appropriately with normal affect.    Labs    Chemistry Recent Labs  Lab 01/15/20 1108 01/16/20 0439 01/16/20 1652  NA 138 136 134*  K 3.9 3.3* 3.7  CL 108 101 104  CO2 24 25 24   GLUCOSE 171* 120* 155*  BUN 13 8 11   CREATININE 1.01 0.92 1.08  CALCIUM 8.4* 8.5* 8.1*  PROT  --   --  5.0*  ALBUMIN  --   --  2.6*  AST  --   --  23  ALT  --   --  12  ALKPHOS  --   --  42  BILITOT  --   --  0.7  GFRNONAA >60 >60 >60  GFRAA >60 >60 >60  ANIONGAP 6 10 6      Hematology Recent Labs  Lab 01/15/20 1108 01/16/20 0439 01/16/20 1652  WBC 9.1 7.3 7.5  RBC 4.27 3.87* 3.62*  HGB 12.2* 11.1* 10.4*  HCT 38.3* 34.3* 32.4*  MCV 89.7 88.6 89.5  MCH 28.6 28.7 28.7  MCHC 31.9 32.4 32.1  RDW 12.5 12.5 12.5  PLT 158 144* 131*    Cardiac EnzymesNo results for input(s): TROPONINI in the last 168 hours. No results for input(s): TROPIPOC in the last 168 hours.   BNPNo results for input(s): BNP, PROBNP in the last 168 hours.   DDimer No results for input(s): DDIMER in the last 168 hours.   Radiology    DG Chest Port 1 View  Result Date: 01/15/2020 CLINICAL DATA:  74 year old male with shortness of breath status post AAA repair postoperative day zero. Shortness of breath. EXAM: PORTABLE CHEST 1 VIEW COMPARISON:  Portable chest 0046 hours today. CTA abdomen and pelvis yesterday did not include the level of the left hilum. FINDINGS: Portable AP supine view at 1040 hours. Continued roughly 6.5 cm masslike opacity at the left hilum,  although perhaps slightly smaller and less linear along its peripheral margins since 0046 hours today. Stable lung volumes. Stable mediastinal contours. Outside of the left hilum Allowing for portable technique the lungs are clear. No pneumothorax or pleural effusion is evident on this supine view. Visualized tracheal air column is within normal limits. IMPRESSION: 1. Continued indeterminate left hilar masslike opacity, 6.5 cm. Severe mucoid impaction is possible although follow-up Chest CT (IV contrast preferred) is necessary to exclude bronchogenic carcinoma. 2. No new cardiopulmonary abnormality. Electronically Signed   By: Genevie Ann M.D.   On: 01/15/2020 10:53   VAS [place] LOWER EXTREMITY ARTERIAL DUPLEX  Result Date: 01/16/2020 LOWER EXTREMITY ARTERIAL DUPLEX STUDY Indications: Rest pain, and peripheral artery disease. High Risk Factors: Hypertension, Diabetes.  Vascular Interventions: 01/15/2020 - 1. Open exposure of left common femoral                         artery with left common external iliac                         thromboembolectomy                         2. Percutaneous access closure right common femoral                         artery                         3. Endovascular aneurysm repair with main body right 23                         x 14 x 12 cm extended with 14 x 7 cm and contralateral                         limb left 12 x 14 cm extended with 10 x 7 cm                         4. Stent of left external iliac artery with 7 x 60                         [facility]  5. Left lower extremity angiography. Current ABI: Limitations: patient pain tolerance Comparison Study: No prior studies. Performing Technologist: Oliver Hum RVT  Examination Guidelines: A complete evaluation includes B-mode imaging, spectral Doppler, color Doppler, and power Doppler as needed of all accessible portions of each vessel. Bilateral testing is considered an integral part of a complete examination.  Limited examinations for reoccurring indications may be performed as noted.  +-----------+--------+-----+---------------+----------+--------+ LEFT       PSV cm/sRatioStenosis       Waveform  Comments +-----------+--------+-----+---------------+----------+--------+ CIA Distal 247                         monophasic         +-----------+--------+-----+---------------+----------+--------+ EIA Distal 258                         monophasic         +-----------+--------+-----+---------------+----------+--------+ CFA Mid    197                         monophasic         +-----------+--------+-----+---------------+----------+--------+ DFA        114                         monophasic         +-----------+--------+-----+---------------+----------+--------+ SFA Prox   131                         monophasic         +-----------+--------+-----+---------------+----------+--------+ SFA Mid    329          50-74% stenosismonophasic         +-----------+--------+-----+---------------+----------+--------+ SFA Distal 114                         monophasic         +-----------+--------+-----+---------------+----------+--------+ POP Prox   53                          monophasic         +-----------+--------+-----+---------------+----------+--------+ POP Distal 111                         monophasic         +-----------+--------+-----+---------------+----------+--------+ ATA Prox   48                          monophasic         +-----------+--------+-----+---------------+----------+--------+ ATA Mid    62                          monophasic         +-----------+--------+-----+---------------+----------+--------+ ATA Distal 16                          monophasic         +-----------+--------+-----+---------------+----------+--------+ PTA Prox   83                          monophasic          +-----------+--------+-----+---------------+----------+--------+ PTA Mid    46  monophasic         +-----------+--------+-----+---------------+----------+--------+ PTA Distal 29                          monophasic         +-----------+--------+-----+---------------+----------+--------+ PERO Prox  32                          monophasic         +-----------+--------+-----+---------------+----------+--------+ PERO Mid   26                          monophasic         +-----------+--------+-----+---------------+----------+--------+ PERO Distal22                          monophasic         +-----------+--------+-----+---------------+----------+--------+  Summary: Right: 50-74% stenosis noted in the mid superficial femoral artery. Monophasic waveforms are noted throughout the left lower arterial system.  See table(s) above for measurements and observations. Electronically signed by Curt Jews MD on 01/16/2020 at 7:16:43 PM.    Final    Telemetry    01/17/20 NSR this AM (episodes of PAF)- Personally Reviewed  ECG    No new tracing as of 01/17/20- Personally Reviewed  Cardiac Studies   Left Heart Cath and Coronary Angiography 06/2016  Conclusion  1. Mild nonobstructive coronary artery disease affecting the left main, LAD, and left circumflex 2. Continued patency of the stented segment in the mid right coronary artery with mild to moderate diffuse narrowing in a relatively small RCA. 3. Preserved LV systolic function with normal LVEDP  Recommendations: Would continue with medical therapy   Diagnostic Dominance: Right      Patient Profile     74 y.o. male with a hx of CAD s/p PCI to RCA in 2006, diabetes mellitus, chronic shortness of breath due to COPD, OSA on CPAP asthma hypertension, infrarenal abdominal aortic aneurysm, lung cancer, ongoing tobacco smoker and peripheral vascular disease who is being seen today for the evaluation of  atrial fibrillation with rapid ventricular rate at the request of Dr. Oneida Alar.  Last cardiac cath in 2017 showed patent stent without obstructive disease.  He was diagnosed with left lung cancer in February 2020.  Offered operation followed by chemo but declined.  Last seen by Dr. Irish Lack 12/13/2019.  Assessment & Plan    1. Atrial fibrillation with RVR: -Converted to NSR after IV metoprolol 2 mg>>maintaining mostly NSR>>episodes of PAF on telemetry review>>rate controlled   -Became hypotensive yesterday afternoon with abdominal pain sent for stat CT to rule out retroperitoneal bleed in the setting of recent surgery>>BP stabilized today  -No anticoagulation until safe from VVS standpoint -If anticoagulation>>will need to consider stopping ASA as he is on Plavix for PAD as well  -BP stable>>>if recurrent RVR>>BP would allow BB -CHA2DS2VASc=4  2. Hypotension status post vascular surgery 01/15/2020:  -On cardiology consultation, SBP's in the 70s with severe abdominal pain, LLQ with radiation to his back>>stat CT angio of abdomen/pelvis to rule out retroperitoneal bleed>>negative  -No abdominal pain today   3.  History of CAD s/p PCI to RCA 2006: -No anginal symptoms -Continue ASA, Plavix  -EKG showed ST depression laterally while in A. fib RVR  5. Anemia -Hgb 15.3 on on 5/24>>> today 11.1>>>10.4 -R/o retroperitoneal bleed>>CTA negative, no retro, no hematoma or pseudo  Signed, Kathyrn Drown NP-C [facility] Pager: [phone] 01/17/2020, 7:49 AM     For questions or updates, please contact   Please consult [email or web address] for contact info under Cardiology/STEMI.

## 2020-01-17 NOTE — Progress Notes (Signed)
Mobility Specialist - Progress Note   01/17/20 1709  Mobility  Activity  (cancellation)  Mobility performed by Mobility specialist  $Mobility charge 1 Mobility   Not seeing pt today as PT saw him recently.    [place] Specialist

## 2020-01-17 NOTE — Progress Notes (Addendum)
Progress Note    01/17/2020 7:34 AM 2 Days Post-Op  Subjective:  Cardiology consult obtained yesterday morning secondary to dyspnea, tachycardia with findings of atrial fibrillation with rapid ventricular rate.  He received normal saline bolus and IV metoprolol with conversion to sinus rhythm.  His complaints yesterday included abdominal and left groin pain and CT was obtained.  Dr. Carlis Abbott reviewed the CT scan and evaluated the patient.  There is evidently no evidence of retroperitoneal bleed.  This morning the patient's primary complaint is of right great toe pain.  He is voiding spontaneously and tolerating his diet.  He denies chest, abdominal or back pain.   Vitals:   01/17/20 0300 01/17/20 0402  BP: (!) 111/52 120/63  Pulse:  75  Resp: 19 17  Temp:  98.3 F (36.8 C)  SpO2: 95% 96%   sys BP 100-120s  Physical Exam: Cardiac: Rhythm and rate are regular.  Telemetry sinus rhythm Lungs: Clear to auscultation bilaterally Incisions: Groin incision well approximated without evidence of hematoma or bleeding.  There is mild erythema, no induration of the left groin and proximal thigh. Extremities: Left lower extremity mildly edematous.  He has bilateral anterior tibial, posterior tibial and peroneal Doppler signals.  Normal motor function and sensation of both feet. Abdomen: Soft, nondistended, nontender.  Active bowel sounds  CBC    Component Value Date/Time   WBC 7.5 01/16/2020 1652   RBC 3.62 (L) 01/16/2020 1652   HGB 10.4 (L) 01/16/2020 1652   HCT 32.4 (L) 01/16/2020 1652   PLT 131 (L) 01/16/2020 1652   MCV 89.5 01/16/2020 1652   MCH 28.7 01/16/2020 1652   MCHC 32.1 01/16/2020 1652   RDW 12.5 01/16/2020 1652   LYMPHSABS 1.5 01/16/2020 1652   MONOABS 0.8 01/16/2020 1652   EOSABS 0.1 01/16/2020 1652   BASOSABS 0.0 01/16/2020 1652    BMET    Component Value Date/Time   NA 134 (L) 01/16/2020 1652   K 3.7 01/16/2020 1652   CL 104 01/16/2020 1652   CO2 24 01/16/2020 1652    GLUCOSE 155 (H) 01/16/2020 1652   BUN 11 01/16/2020 1652   CREATININE 1.08 01/16/2020 1652   CALCIUM 8.1 (L) 01/16/2020 1652   GFRNONAA >60 01/16/2020 1652   GFRAA >60 01/16/2020 1652     Intake/Output Summary (Last 24 hours) at 01/17/2020 0734 Last data filed at 01/17/2020 0700 Gross per 24 hour  Intake 1181.6 ml  Output 1310 ml  Net -128.4 ml    HOSPITAL MEDICATIONS Scheduled Meds: . arformoterol  15 mcg Nebulization BID  . aspirin EC  81 mg Oral Daily  . buPROPion  150 mg Oral Daily  . cholecalciferol  1,000 Units Oral Daily  . clopidogrel  75 mg Oral Daily  . docusate sodium  100 mg Oral Daily  . doxazosin  1 mg Oral Daily  . finasteride  5 mg Oral Daily  . gabapentin  600 mg Oral TID  . heparin  5,000 Units Subcutaneous Q8H  . insulin aspart  0-15 Units Subcutaneous TID WC  . isosorbide mononitrate  30 mg Oral Daily  . lisinopril  10 mg Oral Daily  . loratadine  10 mg Oral Daily  . pantoprazole  40 mg Oral Daily  . polyvinyl alcohol  1-2 drop Both Eyes Q M,W,F  . rosuvastatin  20 mg Oral q1800  . tamsulosin  0.4 mg Oral QHS   Continuous Infusions: . sodium chloride    . lactated ringers 50 mL/hr at 01/17/20 0700  .  magnesium sulfate bolus IVPB     PRN Meds:.sodium chloride, acetaminophen **OR** acetaminophen, alum & mag hydroxide-simeth, guaiFENesin-dextromethorphan, hydrALAZINE, labetalol, levalbuterol, magnesium sulfate bolus IVPB, meclizine, metoprolol tartrate, morphine injection, nitroGLYCERIN, ondansetron, oxyCODONE-acetaminophen, phenol, potassium chloride  Assessment:Status post endovascular aneurysm repair with percutaneous access of right common femoral artery and open exposure of left common femoral artery with left common external iliac thromboembolectomy.  Postoperative course complicated yesterday by onset of atrial fibrillation with RVR.  Cardiology consulted today the patient is hemodynamically stable and in normal sinus  rhythm.    Plan: -Continue current treatment plan and observation.  Ambulate today.  Update CBC, BMP -DVT prophylaxis: Heparin subcu   Risa Grill, PA-C [facility] [phone] 01/17/2020  7:34 AM   I have independently interviewed and examined patient and agree with PA assessment and plan above.  Overall patient looking much better today.  Both feet are warm and left lower extremity in general is without tenderness although some redness to the left thigh which I suppose may be reperfusion.  His H&H is stable and I have ordered heparin at the direction of cardiology and their input is much appreciated given his onset of atrial fibrillation yesterday and has been converted to normal sinus rhythm this morning.  Blood pressure much better this morning as well.  We will get physical therapy to evaluate hopefully we can discuss discharge in the next 1 to 2 days.  Luv Mish C. Donzetta Matters, MD [facility] [place] Office: [phone] Pager: [phone]

## 2020-01-17 NOTE — Progress Notes (Addendum)
[place] for heparin Indication:  Atrial fibrillation   Allergies  Allergen Reactions  . Erythromycin Shortness Of Breath and Swelling  . Simvastatin     CAUSE MUSCLE PAIN,.  . Statins Other (See Comments)    Muscle pain    Patient Measurements: Height: 6\' 2"  (188 cm) Weight: 91.1 kg (200 lb 13.4 oz) IBW/kg (Calculated) : 82.2 Heparin Dosing Weight: 91.1kg  Vital Signs: Temp: 98.5 F (36.9 C) (05/27 0908) Temp Source: Oral (05/27 0908) BP: 123/63 (05/27 0921) Pulse Rate: 70 (05/27 0921)  Labs: Recent Labs    01/14/20 2134 01/14/20 2255 01/15/20 1108 01/15/20 1108 01/16/20 0439 01/16/20 0439 01/16/20 1652 01/16/20 1826 01/17/20 0856  HGB 14.2   < > 12.2*   < > 11.1*   < > 10.4*  --  10.7*  HCT 45.0   < > 38.3*   < > 34.3*  --  32.4*  --  32.8*  PLT 187   < > 158   < > 144*  --  131*  --  133*  APTT  --   --  29  --   --   --   --   --   --   LABPROT 12.7  --  13.4  --   --   --   --   --   --   INR 1.0  --  1.1  --   --   --   --   --   --   HEPARINUNFRC  --   --  <0.10*  --   --   --   --   --   --   CREATININE 1.09   < > 1.01  --  0.92  --  1.08  --   --   TROPONINIHS  --   --   --   --   --   --  17 21*  --    < > = values in this interval not displayed.    Estimated Creatinine Clearance: 70.8 mL/min (by C-G formula based on SCr of 1.08 mg/dL).   Medical History: Past Medical History:  Diagnosis Date  . Asthma   . Coronary artery disease   . Diabetes mellitus without complication ([place])   . History of kidney stones   . Hypertension   . Peripheral vascular disease (HCC)     Medications:  Infusions:  . sodium chloride    . lactated ringers 50 mL/hr at 01/17/20 0700  . magnesium sulfate bolus IVPB      Assessment: 74 y. o male presented to the ED on 01/14/20 with foot pain and possible arterial clot.  He was not on anticogulation PTA.> IV Heparin started 5/24 for arterial clot then stopped 4/25 > S/p  endovascular aneurysm repair, LLE thromboembolectomy on  5/25>>sq Heparin post procedure.   Today, 5/27 pharmacy consulted to start IV heparin for new Afib (PAF). ,VVS okay'd start IV heparin per cards recommendation for short paroxysms of afib + elevated chadsvasc score.  CT abd/pelvis 5/26:  Negative for RP bleed, pseudoaneurysm or hematoma.  No bleeding reported currently per RN.  -last received sq heparin at 0656 5/27.  -hgb 10.7 , pltc 133k  Goal of Therapy:  Heparin level 0.3-0.7 units/ml Monitor platelets by anticoagulation protocol: Yes   Plan:   (Orders placed @13 :21 during Epic downtime) Give heparin 3000 units bolus x1  Start Heparin drip rate 1100 units/hr Check 8 hour heparin level  Daily heparin level and CBC.   Nicole Cella, RPh Clinical Pharmacist [phone] Please check AMION for all [place] phone numbers After 10:00 PM, call [place] [phone] 01/17/2020 11:57 AM

## 2020-01-17 NOTE — Progress Notes (Signed)
  Echocardiogram 2D Echocardiogram has been performed.  Michiel Cowboy 01/17/2020, 11:53 AM

## 2020-01-18 LAB — CBC
HCT: 32.6 % — ABNORMAL LOW (ref 39.0–52.0)
Hemoglobin: 10.5 g/dL — ABNORMAL LOW (ref 13.0–17.0)
MCH: 28.7 pg (ref 26.0–34.0)
MCHC: 32.2 g/dL (ref 30.0–36.0)
MCV: 89.1 fL (ref 80.0–100.0)
Platelets: 129 10*3/uL — ABNORMAL LOW (ref 150–400)
RBC: 3.66 MIL/uL — ABNORMAL LOW (ref 4.22–5.81)
RDW: 12.5 % (ref 11.5–15.5)
WBC: 7.1 10*3/uL (ref 4.0–10.5)
nRBC: 0 % (ref 0.0–0.2)

## 2020-01-18 LAB — HEPARIN LEVEL (UNFRACTIONATED): Heparin Unfractionated: 0.37 IU/mL (ref 0.30–0.70)

## 2020-01-18 LAB — GLUCOSE, CAPILLARY
Glucose-Capillary: 145 mg/dL — ABNORMAL HIGH (ref 70–99)
Glucose-Capillary: 143 mg/dL — ABNORMAL HIGH (ref 70–99)
Glucose-Capillary: 151 mg/dL — ABNORMAL HIGH (ref 70–99)
Glucose-Capillary: 150 mg/dL — ABNORMAL HIGH (ref 70–99)

## 2020-01-18 NOTE — Progress Notes (Signed)
Physical Therapy Treatment Patient Details Name: [name] MRN: [id number] DOB: March 11, 1946 Today's Date: 01/18/2020    History of Present Illness Pt adm 5/24 with 3 month history of worsening lt foot pain. Pt found to have acute on chronic ischemic LLE. Pt also with know AAA. On 5/25 pt underwent lt femoral and external iliac thrombo embolectomy, stent of lt external iliac artery and endovascular aneurysm repair. On 5/26 pt developed afib with rvr and hypotension. PMH - CAD, DM, HTN, PVD, asthma, AAA    PT Comments    Continues to make slow progress with mobility. At this time continue to recommend SNF.    Follow Up Recommendations  SNF     Equipment Recommendations  Rolling walker with 5" wheels    Recommendations for Other Services       Precautions / Restrictions Precautions Precautions: Fall Restrictions Weight Bearing Restrictions: No    Mobility  Bed Mobility Overal bed mobility: Needs Assistance Bed Mobility: Sit to Supine       Sit to supine: Min assist   General bed mobility comments: Assist to bring LLE up onto bed  Transfers Overall transfer level: Needs assistance Equipment used: Rolling walker (2 wheeled) Transfers: Sit to/from Stand Sit to Stand: Min assist         General transfer comment: Assist to bring hips up and for balance. Verbal cues for hand placement  Ambulation/Gait Ambulation/Gait assistance: Min assist Gait Distance (Feet): 30 Feet Assistive device: Rolling walker (2 wheeled) Gait Pattern/deviations: Step-to pattern;Decreased step length - right;Decreased step length - left;Decreased stance time - left;Antalgic;Trunk flexed Gait velocity: decr Gait velocity interpretation: <1.31 ft/sec, indicative of household ambulator General Gait Details: Assist for balance and support. Verbal cues to stay closer to walker for incr stability   Stairs             Wheelchair Mobility    Modified Rankin (Stroke Patients Only)        Balance Overall balance assessment: Needs assistance Sitting-balance support: No upper extremity supported;Feet supported Sitting balance-Leahy Scale: Fair     Standing balance support: Bilateral upper extremity supported Standing balance-Leahy Scale: Poor Standing balance comment: walker and min assist for static standing. Pt very tremulous                            Cognition Arousal/Alertness: Awake/alert Behavior During Therapy: WFL for tasks assessed/performed Overall Cognitive Status: Within Functional Limits for tasks assessed                                        Exercises      General Comments General comments (skin integrity, edema, etc.): VSS      Pertinent Vitals/Pain Pain Assessment: Faces Faces Pain Scale: Hurts even more Pain Location: LLE Pain Descriptors / Indicators: Grimacing;Guarding;Sharp Pain Intervention(s): Limited activity within patient's tolerance;Monitored during session;Repositioned    Home Living                      Prior Function            PT Goals (current goals can now be found in the care plan section) Acute Rehab PT Goals Patient Stated Goal: return homeq Progress towards PT goals: Progressing toward goals    Frequency    Min 3X/week      PT Plan Current plan remains  appropriate    Co-evaluation              AM-PAC PT "6 Clicks" Mobility   Outcome Measure  Help needed turning from your back to your side while in a flat bed without using bedrails?: None Help needed moving from lying on your back to sitting on the side of a flat bed without using bedrails?: A Little Help needed moving to and from a bed to a chair (including a wheelchair)?: A Lot Help needed standing up from a chair using your arms (e.g., wheelchair or bedside chair)?: A Lot Help needed to walk in hospital room?: A Little Help needed climbing 3-5 steps with a railing? : Total 6 Click Score: 15    End  of Session Equipment Utilized During Treatment: Gait belt Activity Tolerance: Patient limited by pain Patient left: with call bell/phone within reach;with family/visitor present;in bed Nurse Communication: Mobility status PT Visit Diagnosis: Unsteadiness on feet (R26.81);Other abnormalities of gait and mobility (R26.89);Pain Pain - Right/Left: Left Pain - part of body: Ankle and joints of foot     Time: 1218-1239 PT Time Calculation (min) (ACUTE ONLY): 21 min  Charges:  $Gait Training: 8-22 mins                     [place] Pager [phone] Office [place] 01/18/2020, 2:36 PM

## 2020-01-18 NOTE — Progress Notes (Addendum)
Progress Note    01/18/2020 7:09 AM 3 Days Post-Op  Subjective: He "had a bad night" due to left knee joint pain and left foot pain. Says his right lateral foot tingles.  He ambulated several steps from his bed yesterday with physical therapy and stated he had to stop due to shortness of breath and "giving out".  No syncope or presyncope. Denies back or abdominal pain   Vitals:   01/17/20 2325 01/18/20 0437  BP: (!) 156/82 (!) 157/75  Pulse: 75   Resp: 20 18  Temp: 98.4 F (36.9 C) 98.4 F (36.9 C)  SpO2: 97% 98%    Physical Exam: Cardiac:  RRR Lungs:  CTAB Incisions:  Left groin without hematoma Extremities:  Continued mild erythema of left thigh>>soft without induration. Popliteal space soft. Mild LLE edema. +Doppler signals X 3 on left Abdomen:  soft  CBC    Component Value Date/Time   WBC 7.1 01/18/2020 0241   RBC 3.66 (L) 01/18/2020 0241   HGB 10.5 (L) 01/18/2020 0241   HCT 32.6 (L) 01/18/2020 0241   PLT 129 (L) 01/18/2020 0241   MCV 89.1 01/18/2020 0241   MCH 28.7 01/18/2020 0241   MCHC 32.2 01/18/2020 0241   RDW 12.5 01/18/2020 0241   LYMPHSABS 1.5 01/16/2020 1652   MONOABS 0.8 01/16/2020 1652   EOSABS 0.1 01/16/2020 1652   BASOSABS 0.0 01/16/2020 1652    BMET    Component Value Date/Time   NA 134 (L) 01/16/2020 1652   K 3.7 01/16/2020 1652   CL 104 01/16/2020 1652   CO2 24 01/16/2020 1652   GLUCOSE 155 (H) 01/16/2020 1652   BUN 11 01/16/2020 1652   CREATININE 1.08 01/16/2020 1652   CALCIUM 8.1 (L) 01/16/2020 1652   GFRNONAA >60 01/16/2020 1652   GFRAA >60 01/16/2020 1652     Intake/Output Summary (Last 24 hours) at 01/18/2020 0709 Last data filed at 01/18/2020 0000 Gross per 24 hour  Intake 889.66 ml  Output 1925 ml  Net -1035.34 ml    HOSPITAL MEDICATIONS Scheduled Meds: . arformoterol  15 mcg Nebulization BID  . aspirin EC  81 mg Oral Daily  . buPROPion  150 mg Oral Daily  . cholecalciferol  1,000 Units Oral Daily  . clopidogrel   75 mg Oral Daily  . docusate sodium  100 mg Oral Daily  . doxazosin  1 mg Oral Daily  . finasteride  5 mg Oral Daily  . gabapentin  600 mg Oral TID  . insulin aspart  0-15 Units Subcutaneous TID WC  . isosorbide mononitrate  30 mg Oral Daily  . lisinopril  10 mg Oral Daily  . loratadine  10 mg Oral Daily  . pantoprazole  40 mg Oral Daily  . polyvinyl alcohol  1-2 drop Both Eyes Q M,W,F  . rosuvastatin  20 mg Oral q1800  . tamsulosin  0.4 mg Oral QHS   Continuous Infusions: . sodium chloride    . heparin 1,100 Units/hr (01/17/20 1353)  . lactated ringers Stopped (01/17/20 1347)  . magnesium sulfate bolus IVPB     PRN Meds:.sodium chloride, acetaminophen **OR** acetaminophen, alum & mag hydroxide-simeth, guaiFENesin-dextromethorphan, hydrALAZINE, labetalol, levalbuterol, magnesium sulfate bolus IVPB, meclizine, metoprolol tartrate, morphine injection, nitroGLYCERIN, ondansetron, oxyCODONE-acetaminophen, phenol, potassium chloride  Assessment: Status post endovascular aneurysm repair with percutaneous access of right common femoral artery and open exposure of left common femoral artery with left common external iliac thromboembolectomy.  SOB on exertion. Hgb stable. Hypotensive episode resolved  Plan: -now  on heparin infusion for new onset of atrial fib. -left knee and foot pain. Skin intact. Afebrile. Normal WBCs -SOB with exertion  Good UOP.  Lungs clear. On heparin gtt -continue treatment plan and PT>>rehab recommended -DVT prophylaxis:  Heparin infusion   Risa Grill, PA-C [facility] [phone] 01/18/2020  7:09 AM   I have independently interviewed and examined patient and agree with PA assessment and plan above.  Bilateral feet are warm.  He is tolerating heparin with stable H&H.  Will transition to oral anticoagulation prior to discharge.  Appears given weakness patient will likely require rehab.  Koven Belinsky C. Donzetta Matters, MD [facility]  [place] Office: [phone] Pager: [phone]

## 2020-01-18 NOTE — Progress Notes (Signed)
ANTICOAGULATION CONSULT NOTE - Follow Up Consult  Pharmacy Consult for heparin Indication: atrial fibrillation  Labs: Recent Labs    01/15/20 1108 01/15/20 1108 01/16/20 0439 01/16/20 0439 01/16/20 1652 01/16/20 1826 01/17/20 0856 01/17/20 2047  HGB 12.2*   < > 11.1*   < > 10.4*  --  10.7*  --   HCT 38.3*   < > 34.3*  --  32.4*  --  32.8*  --   PLT 158   < > 144*  --  131*  --  133*  --   APTT 29  --   --   --   --   --   --   --   LABPROT 13.4  --   --   --   --   --   --   --   INR 1.1  --   --   --   --   --   --   --   HEPARINUNFRC <0.10*  --   --   --   --   --   --  0.39  CREATININE 1.01  --  0.92  --  1.08  --   --   --   TROPONINIHS  --   --   --   --  17 21*  --   --    < > = values in this interval not displayed.    Assessment/Plan:  74yo male therapeutic on heparin with initial dosing for new Afib. Will continue gtt at current rate and confirm stable with am labs.   Wynona Neat, PharmD, BCPS  01/18/2020,12:01 AM

## 2020-01-18 NOTE — TOC Initial Note (Addendum)
Transition of Care [facility]) - Initial/Assessment Note    Patient Details  Name: [name] MRN: [id number] Date of Birth: [birth year]  [facility] CM/SW Contact:    Verdell Carmine, RN Phone Number: 01/18/2020, 3:42 PM  Clinical Narrative:                 Patient admitted for DVT, to surgery for stenting  And thromboembolectomy and endovascular AAA repair. This was complicated by Atrial fibrillation new onset RVR, with hypotention.  Mobility has been sow to progress, has had pain issues as well, and for all the above reasons, PT has recommended SNF placement for rehabilitation. I spoke to the patient and his wife to see how they felt about this. In talking through everything, His wife [name] feel that he should proceed with SNF , and the patient agrees, he is making progress, but they both feel they need a bridge to home.  They both would like to shoot for [facility] rehab, as her mother is there on the long term side, and they have heard some excellent things about their rehabilitation efforts.  PASSR obtained, FL2 done, Sent out for SNF approval Will continue to follow from CM and involve CSW as well.   Expected Discharge Plan: Skilled Nursing Facility Barriers to Discharge: Continued Medical Work up   Patient Goals and CMS Choice Patient states their goals for this hospitalization and ongoing recovery are:: TO be able to navigate at home   Choice offered to / list presented to : Patient  Expected Discharge Plan and Services Expected Discharge Plan: [place] In-house Referral: Clinical Social Work Discharge Planning Services: CM Consult Post Acute Care Choice: [place] Living arrangements for the past 2 months: [place]                                      Prior Living Arrangements/Services Living arrangements for the past 2 months: Single Family Home Lives with:: Spouse Patient language and need for  interpreter reviewed:: Yes Do you feel safe going back to the place where you live?: Yes      Need for Family Participation in Patient Care: Yes (Comment) Care giver support system in place?: Yes (comment)   Criminal Activity/Legal Involvement Pertinent to Current Situation/Hospitalization: No - Comment as needed  Activities of Daily Living Home Assistive Devices/Equipment: Cane (specify quad or straight) ADL Screening (condition at time of admission) Patient's cognitive ability adequate to safely complete daily activities?: Yes Is the patient deaf or have difficulty hearing?: No Does the patient have difficulty seeing, even when wearing glasses/contacts?: No Does the patient have difficulty concentrating, remembering, or making decisions?: No Patient able to express need for assistance with ADLs?: Yes Does the patient have difficulty dressing or bathing?: No Independently performs ADLs?: Yes (appropriate for developmental age) Does the patient have difficulty walking or climbing stairs?: Yes Weakness of Legs: Both Weakness of Arms/Hands: None  Permission Sought/Granted Permission sought to share information with : Case Manager, Customer service manager Permission granted to share information with : Yes, Verbal Permission Granted  Share Information with NAME: Antionette Pretty  Permission granted to share info w AGENCY: SNF  Permission granted to share info w Relationship: Wife  Permission granted to share info w Contact Information: [phone]  Emotional Assessment Appearance:: Appears stated age Attitude/Demeanor/Rapport: Engaged Affect (typically observed): Calm Orientation: : Oriented to Self, Oriented  to Place, Oriented to  Time, Oriented to Situation Alcohol / Substance Use: Not Applicable Psych Involvement: No (comment)  Admission diagnosis:  Preop cardiovascular exam [Z01.810] AAA (abdominal aortic aneurysm) without rupture [facility]) [I71.4] Patient Active Problem  List   Diagnosis Date Noted  . AAA (abdominal aortic aneurysm) without rupture ([place]) 01/15/2020  . Tobacco abuse 06/02/2017  . CAD (coronary artery disease) 07/12/2016  . Bradycardia 07/06/2016  . Chest pain with high risk for cardiac etiology 07/03/2016  . Hypokalemia 07/03/2016  . DM (diabetes mellitus) ([place]) 07/03/2016  . Essential hypertension 07/03/2016  . Gross hematuria 07/03/2016   PCP:  [facility]:   [place] [place] - [address] [place] [address] Phone: [phone] Fax: [place], [place] [address] Phone: [phone] Fax: [phone]     Social Determinants of Health (SDOH) Interventions    Readmission Risk Interventions No flowsheet data found.

## 2020-01-18 NOTE — Progress Notes (Signed)
[place] for heparin Indication:  Atrial fibrillation   Allergies  Allergen Reactions  . Erythromycin Shortness Of Breath and Swelling  . Simvastatin Other (See Comments)    CAUSE MUSCLE PAIN,.  . Statins Other (See Comments)    Muscle pain    Patient Measurements: Height: 6\' 2"  (188 cm) Weight: 91.1 kg (200 lb 13.4 oz) IBW/kg (Calculated) : 82.2 Heparin Dosing Weight: 91.1kg  Vital Signs: Temp: 98.4 F (36.9 C) (05/28 0437) Temp Source: Oral (05/28 0437) BP: 157/75 (05/28 0437) Pulse Rate: 75 (05/27 2325)  Labs: Recent Labs    01/15/20 1108 01/15/20 1108 01/16/20 0439 01/16/20 0439 01/16/20 1652 01/16/20 1652 01/16/20 1826 01/17/20 0856 01/17/20 2047 01/18/20 0241  HGB 12.2*   < > 11.1*   < > 10.4*   < >  --  10.7*  --  10.5*  HCT 38.3*   < > 34.3*   < > 32.4*  --   --  32.8*  --  32.6*  PLT 158   < > 144*   < > 131*  --   --  133*  --  129*  APTT 29  --   --   --   --   --   --   --   --   --   LABPROT 13.4  --   --   --   --   --   --   --   --   --   INR 1.1  --   --   --   --   --   --   --   --   --   HEPARINUNFRC <0.10*  --   --   --   --   --   --   --  0.39 0.37  CREATININE 1.01  --  0.92  --  1.08  --   --   --   --   --   TROPONINIHS  --   --   --   --  17  --  21*  --   --   --    < > = values in this interval not displayed.    Estimated Creatinine Clearance: 70.8 mL/min (by C-G formula based on SCr of 1.08 mg/dL).   Medical History: Past Medical History:  Diagnosis Date  . Asthma   . Coronary artery disease   . Diabetes mellitus without complication ([place])   . History of kidney stones   . Hypertension   . Peripheral vascular disease (HCC)     Medications:  Infusions:  . sodium chloride    . heparin 1,100 Units/hr (01/17/20 1353)  . lactated ringers Stopped (01/17/20 1347)  . magnesium sulfate bolus IVPB      Assessment: 74 y. o male presented to the ED on 01/14/20 with foot pain and possible  arterial clot.  He was not on anticogulation PTA.> IV Heparin started 5/24 for arterial clot then stopped 4/25 > S/p endovascular aneurysm repair, LLE thromboembolectomy on  5/25>>sq Heparin post procedure.   Today, 5/27 pharmacy consulted to start IV heparin for new Afib (PAF). ,VVS okay'd start IV heparin per cards recommendation for short paroxysms of afib + elevated chadsvasc score.   Heparin level therapeutic, CBC stable  CT abd/pelvis 5/26:  Negative for RP bleed, pseudoaneurysm or hematoma.  No bleeding reported currently per RN.     Goal of Therapy:  Heparin level 0.3-0.7 units/ml Monitor platelets  by anticoagulation protocol: Yes   Plan:    Continue heparin at 1100 units / hr Daily heparin level and CBC.  Thank you Anette Guarneri, PharmD [phone] Please check AMION for all [place] phone numbers After 10:00 PM, call [facility] [phone] 01/18/2020 7:47 AM

## 2020-01-18 NOTE — NC FL2 (Signed)
[place] LEVEL OF CARE SCREENING TOOL     IDENTIFICATION  Patient Name: [name] Birthdate: [birth year] Sex: male Admission Date (Current Location): 01/14/2020  [place] and [place] Number:  Kathleen Argue [id number] Facility and Address:  [facility], [place] [address] [place], [place] [address]      Provider Number: [id number]  Attending Physician Name and Address:  Elam Dutch, MD  Relative Name and Phone Number:  Pride Gonzales  [phone]    Current Level of Care: Hospital Recommended Level of Care: [place] Prior Approval Number:    Date Approved/Denied:   PASRR Number: [id number]  Discharge Plan: SNF    Current Diagnoses: Patient Active Problem List   Diagnosis Date Noted  . AAA (abdominal aortic aneurysm) without rupture ([place]) 01/15/2020  . Tobacco abuse 06/02/2017  . CAD (coronary artery disease) 07/12/2016  . Bradycardia 07/06/2016  . Chest pain with high risk for cardiac etiology 07/03/2016  . Hypokalemia 07/03/2016  . DM (diabetes mellitus) ([place]) 07/03/2016  . Essential hypertension 07/03/2016  . Gross hematuria 07/03/2016    Orientation RESPIRATION BLADDER Height & Weight     Self, Time, Situation, Place  Normal Continent Weight: 91.1 kg Height:  6\' 2"  (188 cm)  BEHAVIORAL SYMPTOMS/MOOD NEUROLOGICAL BOWEL NUTRITION STATUS      Continent (See Discharge iNstructions)  AMBULATORY STATUS COMMUNICATION OF NEEDS Skin   Extensive Assist Verbally Normal                       Personal Care Assistance Level of Assistance  Dressing, Bathing Bathing Assistance: Limited assistance   Dressing Assistance: Limited assistance     Functional Limitations Info  Sight Sight Info: Impaired        SPECIAL CARE FACTORS FREQUENCY  PT (By licensed PT), OT (By licensed OT)     PT Frequency: 5 times a week OT Frequency: 5 times a week            Contractures Contractures Info: Not present     Additional Factors Info  Code Status Code Status Info: Full Code             Current Medications (01/18/2020):  This is the current hospital active medication list Current Facility-Administered Medications  Medication Dose Route Frequency Provider Last Rate Last Admin  . 0.9 %  sodium chloride infusion  500 mL Intravenous Once PRN Dagoberto Ligas, PA-C      . acetaminophen (TYLENOL) tablet 325-650 mg  325-650 mg Oral Q4H PRN Dagoberto Ligas, PA-C   650 mg at 01/17/20 1125   Or  . acetaminophen (TYLENOL) suppository 325-650 mg  325-650 mg Rectal Q4H PRN Dagoberto Ligas, PA-C      . alum & mag hydroxide-simeth (MAALOX/MYLANTA) 200-200-20 MG/5ML suspension 15-30 mL  15-30 mL Oral Q2H PRN Dagoberto Ligas, PA-C      . arformoterol (BROVANA) nebulizer solution 15 mcg  15 mcg Nebulization BID Dagoberto Ligas, PA-C   15 mcg at 01/17/20 2039  . aspirin EC tablet 81 mg  81 mg Oral Daily Dagoberto Ligas, PA-C   81 mg at 01/18/20 0853  . buPROPion [facility] SR) 12 hr tablet 150 mg  150 mg Oral Daily Dagoberto Ligas, PA-C   150 mg at 01/18/20 8250  . cholecalciferol (VITAMIN D3) tablet 1,000 Units  1,000 Units Oral Daily Dagoberto Ligas, PA-C   1,000 Units at 01/18/20 0850  . clopidogrel (PLAVIX) tablet 75 mg  75 mg Oral Daily [place],  Rodman Key, PA-C   75 mg at 01/18/20 9147  . docusate sodium (COLACE) capsule 100 mg  100 mg Oral Daily Dagoberto Ligas, PA-C   100 mg at 01/18/20 0853  . doxazosin (CARDURA) tablet 1 mg  1 mg Oral Daily Dagoberto Ligas, PA-C   1 mg at 01/18/20 0851  . finasteride (PROSCAR) tablet 5 mg  5 mg Oral Daily Dagoberto Ligas, PA-C   5 mg at 01/18/20 8295  . gabapentin (NEURONTIN) tablet 600 mg  600 mg Oral TID Dagoberto Ligas, PA-C   600 mg at 01/18/20 1524  . guaiFENesin-dextromethorphan (ROBITUSSIN DM) 100-10 MG/5ML syrup 15 mL  15 mL Oral Q4H PRN Dagoberto Ligas, PA-C      . heparin ADULT infusion 100 units/mL (25000 units/269mL sodium chloride 0.45%)  1,100  Units/hr Intravenous Continuous Wendee Beavers, RPH 11 mL/hr at 01/18/20 0858 1,100 Units/hr at 01/18/20 0858  . hydrALAZINE (APRESOLINE) injection 10 mg  10 mg Intravenous Q4H PRN Angelia Mould, MD   10 mg at 01/15/20 1227  . insulin aspart (novoLOG) injection 0-15 Units  0-15 Units Subcutaneous TID WC Dagoberto Ligas, PA-C   2 Units at 01/18/20 1219  . isosorbide mononitrate (IMDUR) 24 hr tablet 30 mg  30 mg Oral Daily Dagoberto Ligas, PA-C   30 mg at 01/18/20 0850  . labetalol (NORMODYNE) injection 10 mg  10 mg Intravenous Q10 min PRN Angelia Mould, MD      . lactated ringers infusion   Intravenous Continuous Marty Heck, MD   Stopped at 01/17/20 1347  . levalbuterol (XOPENEX) nebulizer solution 0.63 mg  0.63 mg Nebulization Q6H PRN Waynetta Sandy, MD   0.63 mg at 01/16/20 1305  . lisinopril (ZESTRIL) tablet 10 mg  10 mg Oral Daily Dagoberto Ligas, PA-C   10 mg at 01/18/20 6213  . loratadine (CLARITIN) tablet 10 mg  10 mg Oral Daily Dagoberto Ligas, PA-C   10 mg at 01/18/20 0865  . magnesium sulfate IVPB 2 g 50 mL  2 g Intravenous Daily PRN Dagoberto Ligas, PA-C      . meclizine (ANTIVERT) tablet 25 mg  25 mg Oral TID PRN Dagoberto Ligas, PA-C      . metoprolol tartrate (LOPRESSOR) injection 2-5 mg  2-5 mg Intravenous Q2H PRN Dagoberto Ligas, PA-C   5 mg at 01/16/20 1126  . morphine 2 MG/ML injection 2-4 mg  2-4 mg Intravenous Q2H PRN Dagoberto Ligas, PA-C   4 mg at 01/18/20 7846  . nitroGLYCERIN (NITROSTAT) SL tablet 0.4 mg  0.4 mg Sublingual Q5 min PRN Dagoberto Ligas, PA-C      . ondansetron [facility]) injection 4 mg  4 mg Intravenous Q6H PRN Dagoberto Ligas, PA-C      . oxyCODONE-acetaminophen (PERCOCET/ROXICET) 5-325 MG per tablet 1-2 tablet  1-2 tablet Oral Q4H PRN Dagoberto Ligas, PA-C   2 tablet at 01/18/20 1527  . pantoprazole (PROTONIX) EC tablet 40 mg  40 mg Oral Daily Dagoberto Ligas, PA-C   40 mg at 01/18/20 0851  . phenol (CHLORASEPTIC)  mouth spray 1 spray  1 spray Mouth/Throat PRN Dagoberto Ligas, PA-C      . polyvinyl alcohol (LIQUIFILM TEARS) 1.4 % ophthalmic solution 1-2 drop  1-2 drop Both Eyes Q M,W,F Dagoberto Ligas, PA-C   2 drop at 01/18/20 0853  . potassium chloride SA (KLOR-CON) CR tablet 20-40 mEq  20-40 mEq Oral Daily PRN Dagoberto Ligas, PA-C      . rosuvastatin (CRESTOR) tablet 20 mg  20 mg  Oral q1800 Dagoberto Ligas, PA-C   20 mg at 01/15/20 1738  . tamsulosin (FLOMAX) capsule 0.4 mg  0.4 mg Oral QHS Dagoberto Ligas, PA-C   0.4 mg at 01/17/20 2114     Discharge Medications: Please see discharge summary for a list of discharge medications.  Relevant Imaging Results:  Relevant Lab Results:   Additional Information SSN# [id number]  Verdell Carmine, RN

## 2020-01-18 NOTE — Progress Notes (Signed)
Progress Note  Patient Name: [name] Date of Encounter: 01/18/2020  Primary Cardiologist: Larae Grooms, MD  Subjective   Having significant pain in left extremity, unable to move without sharp, shooting sensation.   Inpatient Medications    Scheduled Meds: . arformoterol  15 mcg Nebulization BID  . aspirin EC  81 mg Oral Daily  . buPROPion  150 mg Oral Daily  . cholecalciferol  1,000 Units Oral Daily  . clopidogrel  75 mg Oral Daily  . docusate sodium  100 mg Oral Daily  . doxazosin  1 mg Oral Daily  . finasteride  5 mg Oral Daily  . gabapentin  600 mg Oral TID  . insulin aspart  0-15 Units Subcutaneous TID WC  . isosorbide mononitrate  30 mg Oral Daily  . lisinopril  10 mg Oral Daily  . loratadine  10 mg Oral Daily  . pantoprazole  40 mg Oral Daily  . polyvinyl alcohol  1-2 drop Both Eyes Q M,W,F  . rosuvastatin  20 mg Oral q1800  . tamsulosin  0.4 mg Oral QHS   Continuous Infusions: . sodium chloride    . heparin 1,100 Units/hr (01/17/20 1353)  . lactated ringers Stopped (01/17/20 1347)  . magnesium sulfate bolus IVPB     PRN Meds: sodium chloride, acetaminophen **OR** acetaminophen, alum & mag hydroxide-simeth, guaiFENesin-dextromethorphan, hydrALAZINE, labetalol, levalbuterol, magnesium sulfate bolus IVPB, meclizine, metoprolol tartrate, morphine injection, nitroGLYCERIN, ondansetron, oxyCODONE-acetaminophen, phenol, potassium chloride   Vital Signs    Vitals:   01/17/20 2018 01/17/20 2040 01/17/20 2325 01/18/20 0437  BP: 138/81  (!) 156/82 (!) 157/75  Pulse: 74  75   Resp: (!) 22  20 18   Temp: 98.7 F (37.1 C)  98.4 F (36.9 C) 98.4 F (36.9 C)  TempSrc: Oral  Oral Oral  SpO2: 98% 97% 97% 98%  Weight:      Height:        Intake/Output Summary (Last 24 hours) at 01/18/2020 0814 Last data filed at 01/18/2020 0000 Gross per 24 hour  Intake 889.66 ml  Output 1625 ml  Net -735.34 ml   Filed Weights   01/14/20 2120 01/15/20 0243    Weight: 97.1 kg 91.1 kg    Physical Exam   General: Well developed, well nourished, NAD Neck: Negative for carotid bruits. No JVD Lungs:Clear to ausculation bilaterally. No wheezes, rales, or rhonchi.  Cardiovascular: RRR with S1 S2.  Abdomen: Soft, non-tender, non-distended. No obvious abdominal masses. Extremities: Significant pain throughout left leg. 1+ LLE edema. Neuro: Alert and oriented. No focal deficits. No facial asymmetry. MAE spontaneously. Psych: Responds to questions appropriately with normal affect.    Labs    Chemistry Recent Labs  Lab 01/15/20 1108 01/16/20 0439 01/16/20 1652  NA 138 136 134*  K 3.9 3.3* 3.7  CL 108 101 104  CO2 24 25 24   GLUCOSE 171* 120* 155*  BUN 13 8 11   CREATININE 1.01 0.92 1.08  CALCIUM 8.4* 8.5* 8.1*  PROT  --   --  5.0*  ALBUMIN  --   --  2.6*  AST  --   --  23  ALT  --   --  12  ALKPHOS  --   --  42  BILITOT  --   --  0.7  GFRNONAA >60 >60 >60  GFRAA >60 >60 >60  ANIONGAP 6 10 6      Hematology Recent Labs  Lab 01/16/20 1652 01/17/20 0856 01/18/20 0241  WBC 7.5 7.6  7.1  RBC 3.62* 3.68* 3.66*  HGB 10.4* 10.7* 10.5*  HCT 32.4* 32.8* 32.6*  MCV 89.5 89.1 89.1  MCH 28.7 29.1 28.7  MCHC 32.1 32.6 32.2  RDW 12.5 12.5 12.5  PLT 131* 133* 129*    Cardiac EnzymesNo results for input(s): TROPONINI in the last 168 hours. No results for input(s): TROPIPOC in the last 168 hours.   BNPNo results for input(s): BNP, PROBNP in the last 168 hours.   DDimer No results for input(s): DDIMER in the last 168 hours.   Radiology    ECHOCARDIOGRAM COMPLETE  Result Date: 01/17/2020    ECHOCARDIOGRAM REPORT   Patient Name:   Cindra Eves Umble Date of Exam: 01/17/2020 Medical Rec #:  [id number]     Height:       74.0 in Accession #:    [id number]    Weight:       200.8 lb Date of Birth:  14-May-1946      BSA:          2.177 m Patient Age:    74 years      BP:           123/63 mmHg Patient Gender: M             HR:           72 bpm. Exam  Location:  Inpatient Procedure: 2D Echo, Cardiac Doppler and Color Doppler Indications:    Atrial Fibrillation 427.31 / I48.91  History:        Patient has prior history of Echocardiogram examinations, most                 recent 07/16/2005. CAD, Signs/Symptoms:Chest Pain; Risk                 Factors:Hypertension, Diabetes and Former Smoker. AAA.                 Bradycardia.  Sonographer:    Vickie Epley RDCS Referring Phys: [id number] [place] CHRISTOPHER  Sonographer Comments: Patient refused definity. IMPRESSIONS  1. Left ventricular ejection fraction, by estimation, is 60 to 65%. The left ventricle has normal function. The left ventricle has no regional wall motion abnormalities. Left ventricular diastolic parameters are consistent with Grade I diastolic dysfunction (impaired relaxation).  2. Right ventricular systolic function is normal. The right ventricular size is normal.  3. The mitral valve is grossly normal. Trivial mitral valve regurgitation.  4. The aortic valve is tricuspid. Aortic valve regurgitation is not visualized.  5. The inferior vena cava is normal in size with greater than 50% respiratory variability, suggesting right atrial pressure of 3 mmHg. FINDINGS  Left Ventricle: Left ventricular ejection fraction, by estimation, is 60 to 65%. The left ventricle has normal function. The left ventricle has no regional wall motion abnormalities. The left ventricular internal cavity size was normal in size. There is  no left ventricular hypertrophy. Left ventricular diastolic parameters are consistent with Grade I diastolic dysfunction (impaired relaxation). Indeterminate filling pressures. Right Ventricle: The right ventricular size is normal. No increase in right ventricular wall thickness. Right ventricular systolic function is normal. Left Atrium: Left atrial size was normal in size. Right Atrium: Right atrial size was normal in size. Pericardium: There is no evidence of pericardial effusion. Mitral  Valve: The mitral valve is grossly normal. Trivial mitral valve regurgitation. Tricuspid Valve: The tricuspid valve is grossly normal. Tricuspid valve regurgitation is trivial. Aortic Valve: The aortic valve is tricuspid. Aortic valve regurgitation is not  visualized. Pulmonic Valve: The pulmonic valve was normal in structure. Pulmonic valve regurgitation is not visualized. Aorta: The aortic root and ascending aorta are structurally normal, with no evidence of dilitation. Venous: The inferior vena cava is normal in size with greater than 50% respiratory variability, suggesting right atrial pressure of 3 mmHg. IAS/Shunts: No atrial level shunt detected by color flow Doppler.  LEFT VENTRICLE PLAX 2D LVIDd:         5.22 cm      Diastology LVIDs:         3.71 cm      LV e' lateral:   9.14 cm/s LV PW:         0.84 cm      LV E/e' lateral: 9.8 LV IVS:        0.84 cm      LV e' medial:    8.05 cm/s LVOT diam:     2.20 cm      LV E/e' medial:  11.1 LV SV:         116 LV SV Index:   53 LVOT Area:     3.80 cm  LV Volumes (MOD) LV vol d, MOD A2C: 119.0 ml LV vol d, MOD A4C: 140.0 ml LV vol s, MOD A2C: 58.6 ml LV vol s, MOD A4C: 45.4 ml LV SV MOD A2C:     60.4 ml LV SV MOD A4C:     140.0 ml LV SV MOD BP:      76.4 ml RIGHT VENTRICLE RV S prime:     16.00 cm/s TAPSE (M-mode): 2.7 cm LEFT ATRIUM             Index       RIGHT ATRIUM           Index LA diam:        3.50 cm 1.61 cm/m  RA Area:     16.50 cm LA Vol (A2C):   45.7 ml 20.99 ml/m RA Volume:   42.70 ml  19.61 ml/m LA Vol (A4C):   37.2 ml 17.11 ml/m LA Biplane Vol: 50.8 ml 23.33 ml/m  AORTIC VALVE LVOT Vmax:   167.00 cm/s LVOT Vmean:  99.300 cm/s LVOT VTI:    0.306 m  AORTA Ao Root diam: 3.30 cm MITRAL VALVE MV Area (PHT): 3.77 cm    SHUNTS MV Decel Time: 201 msec    Systemic VTI:  0.31 m MV E velocity: 89.50 cm/s  Systemic Diam: 2.20 cm MV A velocity: 73.30 cm/s MV E/A ratio:  1.22 Lyman Bishop MD Electronically signed by Lyman Bishop MD Signature Date/Time:  01/17/2020/1:55:06 PM    Final    VAS [place] LOWER EXTREMITY ARTERIAL DUPLEX  Result Date: 01/16/2020 LOWER EXTREMITY ARTERIAL DUPLEX STUDY Indications: Rest pain, and peripheral artery disease. High Risk Factors: Hypertension, Diabetes.  Vascular Interventions: 01/15/2020 - 1. Open exposure of left common femoral                         artery with left common external iliac                         thromboembolectomy                         2. Percutaneous access closure right common femoral  artery                         3. Endovascular aneurysm repair with main body right 23                         x 14 x 12 cm extended with 14 x 7 cm and contralateral                         limb left 12 x 14 cm extended with 10 x 7 cm                         4. Stent of left external iliac artery with 7 x 60                         [facility]                         5. Left lower extremity angiography. Current ABI: Limitations: patient pain tolerance Comparison Study: No prior studies. Performing Technologist: Oliver Hum RVT  Examination Guidelines: A complete evaluation includes B-mode imaging, spectral Doppler, color Doppler, and power Doppler as needed of all accessible portions of each vessel. Bilateral testing is considered an integral part of a complete examination. Limited examinations for reoccurring indications may be performed as noted.  +-----------+--------+-----+---------------+----------+--------+ LEFT       PSV cm/sRatioStenosis       Waveform  Comments +-----------+--------+-----+---------------+----------+--------+ CIA Distal 247                         monophasic         +-----------+--------+-----+---------------+----------+--------+ EIA Distal 258                         monophasic         +-----------+--------+-----+---------------+----------+--------+ CFA Mid    197                         monophasic          +-----------+--------+-----+---------------+----------+--------+ DFA        114                         monophasic         +-----------+--------+-----+---------------+----------+--------+ SFA Prox   131                         monophasic         +-----------+--------+-----+---------------+----------+--------+ SFA Mid    329          50-74% stenosismonophasic         +-----------+--------+-----+---------------+----------+--------+ SFA Distal 114                         monophasic         +-----------+--------+-----+---------------+----------+--------+ POP Prox   53                          monophasic         +-----------+--------+-----+---------------+----------+--------+ POP Distal 111  monophasic         +-----------+--------+-----+---------------+----------+--------+ ATA Prox   48                          monophasic         +-----------+--------+-----+---------------+----------+--------+ ATA Mid    62                          monophasic         +-----------+--------+-----+---------------+----------+--------+ ATA Distal 16                          monophasic         +-----------+--------+-----+---------------+----------+--------+ PTA Prox   83                          monophasic         +-----------+--------+-----+---------------+----------+--------+ PTA Mid    46                          monophasic         +-----------+--------+-----+---------------+----------+--------+ PTA Distal 29                          monophasic         +-----------+--------+-----+---------------+----------+--------+ PERO Prox  32                          monophasic         +-----------+--------+-----+---------------+----------+--------+ PERO Mid   26                          monophasic         +-----------+--------+-----+---------------+----------+--------+ PERO Distal22                          monophasic          +-----------+--------+-----+---------------+----------+--------+  Summary: Right: 50-74% stenosis noted in the mid superficial femoral artery. Monophasic waveforms are noted throughout the left lower arterial system.  See table(s) above for measurements and observations. Electronically signed by Curt Jews MD on 01/16/2020 at 7:16:43 PM.    Final    CT Angio Abd/Pel w/ and/or w/o  Result Date: 01/17/2020 CLINICAL DATA:  Hypotension after EVAR to treat abdominal aortic aneurysm, thromboembolectomy of the left common femoral and external iliac arteries and stenting of the left external iliac artery. EXAM: CT ANGIOGRAPHY ABDOMEN AND PELVIS WITH CONTRAST AND WITHOUT CONTRAST TECHNIQUE: Multidetector CT imaging of the abdomen and pelvis was performed using the standard protocol during bolus administration of intravenous contrast. Multiplanar reconstructed images and MIPs were obtained and reviewed to evaluate the vascular anatomy. CONTRAST:  129mL OMNIPAQUE IOHEXOL 350 MG/ML SOLN COMPARISON:  01/14/2020 FINDINGS: VASCULAR Aorta: Abdominal aortic endograft appears appropriately positioned and patent. No evidence of complication following the procedure. Bilateral common iliac limbs are patent. Initial aortic bolus was limited due to stoppage of the table during the first scan and the patient had reinjected. There is no obvious endoleak on the limited arterial imaging and a delayed venous phase. Celiac: Normally patent. SMA: Normally patent. Renals: Bilateral single renal arteries demonstrate normal patency. IMA: Proximal origin occluded. Distal branches are reconstituted by SMA collateral supply. Inflow: Bilateral common iliac artery limbs  are normally patent and well opposed. Native iliac arteries are normally patent. A left external iliac artery stent appears patent. Proximal Outflow: Bilateral common femoral arteries and femoral bifurcations are normally patent. There is some mild stranding around the left common  femoral artery with small amount of subcutaneous air after femoral cutdown. No significant hematoma or evidence of pseudoaneurysm. Veins: Delayed imaging shows no venous abnormalities or hemorrhage. Review of the MIP images confirms the above findings. NON-VASCULAR Lower chest: No acute abnormality. Hepatobiliary: No focal liver abnormality is seen. No gallstones, gallbladder wall thickening, or biliary dilatation. Pancreas: Unremarkable. No pancreatic ductal dilatation or surrounding inflammatory changes. Spleen: Normal in size without focal abnormality. Adrenals/Urinary Tract: Adrenal glands are unremarkable. Kidneys are normal, without renal calculi, focal lesion, or hydronephrosis. Bladder is unremarkable. Stomach/Bowel: Bowel shows no evidence of obstruction, ileus, inflammation or perforation. No free air identified. Lymphatic: No enlarged lymph nodes identified. Reproductive: Prostate is unremarkable. Other: No abdominal wall hernia or abnormality. No abdominopelvic ascites. Musculoskeletal: No acute or significant osseous findings. IMPRESSION: 1. No evidence of complication following the recent endovascular procedure. The aortic endograft appears appropriately positioned and patent. No obvious endoleak on the limited arterial imaging and a delayed venous phase. 2. Patent left external iliac artery stent. Mild stranding around the left common femoral artery with small amount of subcutaneous air in the left groin after femoral cutdown. No significant hematoma or evidence of pseudoaneurysm. Electronically Signed   By: Aletta Edouard M.D.   On: 01/17/2020 08:11   Telemetry    01/18/20 NSR, no recurrent AF  - Personally Reviewed  ECG    No new tracing as of 01/18/20- Personally Reviewed  Cardiac Studies   LHC 07/05/2016: Mild nonobstructive coronary artery disease affecting the left main, LAD, and left circumflex 2. Continued patency of the stented segment in the mid right coronary artery with mild  to moderate diffuse narrowing in a relatively small RCA. 3. Preserved LV systolic function with normal LVEDP  Recommendations: Would continue with medical   Echocardiogram 01/17/2020:  1. Left ventricular ejection fraction, by estimation, is 60 to 65%. The  left ventricle has normal function. The left ventricle has no regional  wall motion abnormalities. Left ventricular diastolic parameters are  consistent with Grade I diastolic  dysfunction (impaired relaxation).  2. Right ventricular systolic function is normal. The right ventricular  size is normal.  3. The mitral valve is grossly normal. Trivial mitral valve  regurgitation.  4. The aortic valve is tricuspid. Aortic valve regurgitation is not  visualized.  5. The inferior vena cava is normal in size with greater than 50%  respiratory variability, suggesting right atrial pressure of 3 mmHg.   Patient Profile     74 y.o. male with a hx of CAD s/p PCI to RCA in 2006, diabetes mellitus, chronic shortness of breath due to COPD,OSAon CPAP asthma hypertension,infrarenal abdominal aortic aneurysm, lung cancer, ongoing tobacco smoker and peripheral vascular diseasewho is being seen today for the evaluation of atrial fibrillation with rapid ventricular rateat the request ofDr. Fields.  Last cardiac cath in 2017 showed patent stent without obstructive disease.  He was diagnosed with left lung cancer in February 2020. Offered operation followed by chemo but declined. Last seen by Dr. Irish Lack 12/13/2019.  Assessment & Plan    1.Atrial fibrillation with RVR: -Converted to NSR after IV metoprolol 2 mg>>maintaining mostly NSR>>no recurrent AF episodes overnight>>maintaining NSR  -Episode of hypotensive yesterday afternoon with abdominal pain sent for stat CT to rule  out retroperitoneal bleed in the setting of recent surgery>>BP stabilized today  -Currently on Heparin infusion  -Recommend changing to apixaban and clopidogrel  however will defer to [facility] if he also needs aspirin for a time for triple therapy based on recent procedures or if apixaban+clopidogrel is acceptable. -CHA2DS2VASc=4 -Echocardiogram performed 01/17/20 shows normal LV function and G1 DD with no valvular disease  2. S/p endovascular aneurysm repair: -with percutaneous access of right common femoral artery and open exposure of left common femoral artery with left common external iliac thromboembolectomy.   -Having significant pain today>>cannot move without shooting pain down procedural leg. Will discuss with MD   3.Hypotension status post vascular surgery 01/15/2020:  -On cardiology consultation, SBP's in the 70s with severe abdominal pain, LLQ with radiation to his back>>stat CT angio of abdomen/pelvis to rule out retroperitoneal bleed>>negative  -No abdominal pain today -Stable BP    4.History of CAD s/p PCI to RCA 2006: -No anginal symptoms -On Heparin infusion for now  -EKG showed ST depression laterally while in A. fib RVR  5. Anemia -Hgb 15.3 on on 5/24>>> today 11.1>>>10.4>>10.5 -R/oretroperitoneal bleed>>CTA negative, no retro, no hematoma or pseudo   Signed, Kathyrn Drown NP-C [facility] Pager: [phone] 01/18/2020, 8:14 AM     For questions or updates, please contact   Please consult [email or web address] for contact info under Cardiology/STEMI.

## 2020-01-19 LAB — HEPARIN LEVEL (UNFRACTIONATED)
Heparin Unfractionated: 0.23 IU/mL — ABNORMAL LOW (ref 0.30–0.70)
Heparin Unfractionated: 0.3 IU/mL (ref 0.30–0.70)

## 2020-01-19 LAB — GLUCOSE, CAPILLARY
Glucose-Capillary: 162 mg/dL — ABNORMAL HIGH (ref 70–99)
Glucose-Capillary: 220 mg/dL — ABNORMAL HIGH (ref 70–99)
Glucose-Capillary: 190 mg/dL — ABNORMAL HIGH (ref 70–99)
Glucose-Capillary: 155 mg/dL — ABNORMAL HIGH (ref 70–99)

## 2020-01-19 LAB — CBC
HCT: 32.1 % — ABNORMAL LOW (ref 39.0–52.0)
Hemoglobin: 10.2 g/dL — ABNORMAL LOW (ref 13.0–17.0)
MCH: 28.2 pg (ref 26.0–34.0)
MCHC: 31.8 g/dL (ref 30.0–36.0)
MCV: 88.7 fL (ref 80.0–100.0)
Platelets: 151 10*3/uL (ref 150–400)
RBC: 3.62 MIL/uL — ABNORMAL LOW (ref 4.22–5.81)
RDW: 12.7 % (ref 11.5–15.5)
WBC: 6.5 10*3/uL (ref 4.0–10.5)
nRBC: 0 % (ref 0.0–0.2)

## 2020-01-19 LAB — BASIC METABOLIC PANEL
Anion gap: 8 (ref 5–15)
BUN: 9 mg/dL (ref 8–23)
CO2: 24 mmol/L (ref 22–32)
Calcium: 8.6 mg/dL — ABNORMAL LOW (ref 8.9–10.3)
Chloride: 107 mmol/L (ref 98–111)
Creatinine, Ser: 1.07 mg/dL (ref 0.61–1.24)
GFR calc Af Amer: >60 mL/min (ref >60)
GFR calc non Af Amer: >60 mL/min (ref >60)
Glucose, Bld: 174 mg/dL — ABNORMAL HIGH (ref 70–99)
Potassium: 4.1 mmol/L (ref 3.5–5.1)
Sodium: 139 mmol/L (ref 135–145)

## 2020-01-19 MED ORDER — GABAPENTIN 400 MG PO CAPS
800.0000 mg | ORAL_CAPSULE | Freq: Three times a day (TID) | ORAL | Status: DC
  Administered 2020-01-19 – 2020-01-22 (×10): 800 mg via ORAL
  Filled 2020-01-19 (×10): qty 2

## 2020-01-19 MED ORDER — HEPARIN BOLUS VIA INFUSION
1000.0000 [IU] | Freq: Once | INTRAVENOUS | Status: AC
  Administered 2020-01-19: 1000 [IU] via INTRAVENOUS
  Filled 2020-01-19: qty 1000

## 2020-01-19 NOTE — Progress Notes (Signed)
Pt addressed that he is "allergic to statin in general causing leg pain". MD notified. Holding crestor per pt request.   Lavenia Atlas, RN

## 2020-01-19 NOTE — Progress Notes (Signed)
    Subjective  - POD #4, status post endovascular aneurysm repair with thrombectomy of left iliac system.  Patient is complaining of numbness and pain in his left foot which has been a problem for him which predates his surgery.  He was able to walk to the door today.   Physical Exam:  Both groin incisions are intact without separation. Monophasic left posterior tibial Doppler, biphasic right posterior tibial Doppler. Abdomen is soft       Assessment/Plan:  POD #4  Left foot pain: This is likely neuropathic in origin secondary to chronic ischemia which sounds like has been going on for 3 months.  There is no active ischemia.  He does have tibial disease on angiography.  I will increase his Neurontin  Dispo: Pending final decision for physical therapy he may need SNF versus rehab  Continue statin therapy Continue dual antiplatelet therapy.  Wells Brabham 01/19/2020 8:16 AM --  Vitals:   01/19/20 0337 01/19/20 0805  BP: (!) 143/77   Pulse: 70   Resp: (!) 25   Temp: 98.4 F (36.9 C)   SpO2: 94% 96%    Intake/Output Summary (Last 24 hours) at 01/19/2020 0816 Last data filed at 01/19/2020 0626 Gross per 24 hour  Intake 773.12 ml  Output 1625 ml  Net -851.88 ml     Laboratory CBC    Component Value Date/Time   WBC 6.5 01/19/2020 0404   HGB 10.2 (L) 01/19/2020 0404   HCT 32.1 (L) 01/19/2020 0404   PLT 151 01/19/2020 0404    BMET    Component Value Date/Time   NA 134 (L) 01/16/2020 1652   K 3.7 01/16/2020 1652   CL 104 01/16/2020 1652   CO2 24 01/16/2020 1652   GLUCOSE 155 (H) 01/16/2020 1652   BUN 11 01/16/2020 1652   CREATININE 1.08 01/16/2020 1652   CALCIUM 8.1 (L) 01/16/2020 1652   GFRNONAA >60 01/16/2020 1652   GFRAA >60 01/16/2020 1652    COAG Lab Results  Component Value Date   INR 1.1 01/15/2020   INR 1.0 01/14/2020   INR 1.0 12/17/2019   No results found for: PTT  Antibiotics Anti-infectives (From admission, onward)   Start      Dose/Rate Route Frequency Ordered Stop   01/15/20 1600  ceFAZolin (ANCEF) IVPB 2g/100 mL premix     2 g 200 mL/hr over 30 Minutes Intravenous Every 8 hours 01/15/20 1209 01/16/20 0030   01/15/20 0045  ceFAZolin (ANCEF) IVPB 2g/100 mL premix     2 g 200 mL/hr over 30 Minutes Intravenous On call 01/15/20 0034 01/16/20 0045       V. Leia Alf, M.D., [facility] [place] Office: [phone] Pager:  [phone]

## 2020-01-19 NOTE — Progress Notes (Signed)
ANTICOAGULATION CONSULT NOTE - Follow Up Consult  Pharmacy Consult for heparin Indication: atrial fibrillation  Labs: Recent Labs    01/16/20 1652 01/16/20 1652 01/16/20 1826 01/17/20 0856 01/17/20 0856 01/17/20 2047 01/18/20 0241 01/19/20 0404  HGB 10.4*   < >  --  10.7*   < >  --  10.5* 10.2*  HCT 32.4*   < >  --  32.8*  --   --  32.6* 32.1*  PLT 131*   < >  --  133*  --   --  129* 151  HEPARINUNFRC  --   --   --   --   --  0.39 0.37 0.23*  CREATININE 1.08  --   --   --   --   --   --   --   TROPONINIHS 17  --  21*  --   --   --   --   --    < > = values in this interval not displayed.    Assessment: 74yo male subtherapeutic on heparin after two levels at lower end of goal; no gtt issues or signs of bleeding per RN.  Goal of Therapy:  Heparin level 0.3-0.7 units/ml   Plan:  Will give small bolus of heparin 1000 units and increase heparin gtt by 1-2 units/kg/hr to 1250 units/hr and check level in 6 hours.    Wynona Neat, PharmD, BCPS  01/19/2020,5:39 AM

## 2020-01-19 NOTE — Progress Notes (Signed)
Physical Therapy Treatment Patient Details Name: [name] MRN: [id number] DOB: May 20, 1946 Today's Date: 01/19/2020    History of Present Illness Pt adm 5/24 with 3 month history of worsening lt foot pain. Pt found to have acute on chronic ischemic LLE. Pt also with know AAA. On 5/25 pt underwent lt femoral and external iliac thrombo embolectomy, stent of lt external iliac artery and endovascular aneurysm repair. On 5/26 pt developed afib with rvr and hypotension. PMH - CAD, DM, HTN, PVD, asthma, AAA    PT Comments    Pt reports his pain and swelling in LLE has worsened since yesterday. Performed supine exercised prior to transferring OOB to recliner chair. Pt was able to ambulate short distance today secondary to pain. On rising, pt shaking with pain and effort. Continue to feel SNF level therapies are appropriate. Will follow acutely.    Follow Up Recommendations  SNF     Equipment Recommendations  Rolling walker with 5" wheels    Recommendations for Other Services       Precautions / Restrictions Precautions Precautions: Fall Restrictions Weight Bearing Restrictions: No    Mobility  Bed Mobility Overal bed mobility: Needs Assistance Bed Mobility: Sit to Supine     Supine to sit: Supervision;HOB elevated     General bed mobility comments: Slow labored movement to rise and scoot to EOB. Use of bed rails with HOB elevated. Supervision for lines  Transfers Overall transfer level: Needs assistance Equipment used: Rolling walker (2 wheeled) Transfers: Sit to/from Stand Sit to Stand: Min assist         General transfer comment: min A to power up with cues for hand placement with RW  Ambulation/Gait Ambulation/Gait assistance: Min assist Gait Distance (Feet): 4 Feet Assistive device: Rolling walker (2 wheeled) Gait Pattern/deviations: Step-to pattern;Decreased step length - right;Decreased step length - left;Decreased stance time - left;Antalgic;Trunk flexed Gait  velocity: decr   General Gait Details: Min A for stability to step 4 feet form EOB to recliner chair. Pt shaking with effort and pain   Stairs             Wheelchair Mobility    Modified Rankin (Stroke Patients Only)       Balance Overall balance assessment: Needs assistance Sitting-balance support: No upper extremity supported;Feet supported Sitting balance-Leahy Scale: Fair     Standing balance support: Bilateral upper extremity supported Standing balance-Leahy Scale: Poor Standing balance comment: walker and min assist for static standing. Pt very tremulous                            Cognition Arousal/Alertness: Awake/alert Behavior During Therapy: WFL for tasks assessed/performed Overall Cognitive Status: Within Functional Limits for tasks assessed                                        Exercises General Exercises - Lower Extremity Ankle Circles/Pumps: AROM;Both;10 reps;Supine Heel Slides: AROM;Left;10 reps;Supine Hip ABduction/ADduction: AAROM;Left;10 reps;Supine Straight Leg Raises: AAROM;Left;5 reps;Supine    General Comments        Pertinent Vitals/Pain Pain Assessment: 0-10 Pain Score: 8  Pain Location: LLE Pain Descriptors / Indicators: Grimacing;Guarding;Sharp Pain Intervention(s): Monitored during session;Limited activity within patient's tolerance;Repositioned;Premedicated before session    Home Living  Prior Function            PT Goals (current goals can now be found in the care plan section) Acute Rehab PT Goals Patient Stated Goal: return home PT Goal Formulation: With patient/family Time For Goal Achievement: 01/31/20 Potential to Achieve Goals: Good Progress towards PT goals: Progressing toward goals    Frequency    Min 3X/week      PT Plan Current plan remains appropriate    Co-evaluation              AM-PAC PT "6 Clicks" Mobility   Outcome Measure   Help needed turning from your back to your side while in a flat bed without using bedrails?: None Help needed moving from lying on your back to sitting on the side of a flat bed without using bedrails?: A Little Help needed moving to and from a bed to a chair (including a wheelchair)?: A Little Help needed standing up from a chair using your arms (e.g., wheelchair or bedside chair)?: A Little Help needed to walk in hospital room?: A Little Help needed climbing 3-5 steps with a railing? : Total 6 Click Score: 17    End of Session Equipment Utilized During Treatment: Gait belt Activity Tolerance: Patient limited by pain Patient left: with call bell/phone within reach;with family/visitor present;in chair Nurse Communication: Mobility status PT Visit Diagnosis: Unsteadiness on feet (R26.81);Other abnormalities of gait and mobility (R26.89);Pain Pain - Right/Left: Left Pain - part of body: Ankle and joints of foot     Time: 4650-3546 PT Time Calculation (min) (ACUTE ONLY): 21 min  Charges:  $Therapeutic Activity: 8-22 mins                     Benjiman Core, [place] Pager [phone] [place] 01/19/2020, 10:01 AM

## 2020-01-19 NOTE — Progress Notes (Signed)
[place] for heparin Indication:  Atrial fibrillation   Allergies  Allergen Reactions  . Erythromycin Shortness Of Breath and Swelling  . Simvastatin Other (See Comments)    CAUSE MUSCLE PAIN,.  . Statins Other (See Comments)    Muscle pain    Patient Measurements: Height: 6\' 2"  (188 cm) Weight: 91.1 kg (200 lb 13.4 oz) IBW/kg (Calculated) : 82.2 Heparin Dosing Weight: 91.1kg  Vital Signs: Temp: 98 F (36.7 C) (05/29 1207) Temp Source: Oral (05/29 1207) BP: 98/61 (05/29 1207) Pulse Rate: 64 (05/29 1207)  Labs: Recent Labs    01/16/20 1652 01/16/20 1652 01/16/20 1826 01/17/20 0856 01/17/20 2047 01/18/20 0241 01/19/20 0404 01/19/20 1127  HGB 10.4*   < >  --  10.7*  --  10.5* 10.2*  --   HCT 32.4*   < >  --  32.8*  --  32.6* 32.1*  --   PLT 131*   < >  --  133*  --  129* 151  --   HEPARINUNFRC  --   --   --   --    < > 0.37 0.23* 0.30  CREATININE 1.08  --   --   --   --   --   --   --   TROPONINIHS 17  --  21*  --   --   --   --   --    < > = values in this interval not displayed.    Estimated Creatinine Clearance: 70.8 mL/min (by C-G formula based on SCr of 1.08 mg/dL).   Medical History: Past Medical History:  Diagnosis Date  . Asthma   . Coronary artery disease   . Diabetes mellitus without complication ([place])   . History of kidney stones   . Hypertension   . Peripheral vascular disease (HCC)     Medications:  Infusions:  . sodium chloride    . heparin 1,250 Units/hr (01/19/20 0849)  . lactated ringers Stopped (01/17/20 1347)  . magnesium sulfate bolus IVPB      Assessment: 74 y. o male presented to the ED on 01/14/20 with foot pain and possible arterial clot. No AC noted PTA. Was started on IV heparin which was stopped after endovascular aneurysm repair, LLE thromboembolectomy on 5/25. Pharmacy asked to restart IV heparin for atrial fibrillation.  CT abd/pelvis 5/26:  Negative for RP bleed, pseudoaneurysm or  hematoma.  Heparin level on lower end of therapeutic at 0.3 after re-bolusing 1000 units and increasing drip rate to 1250 units/hr. CBC stable, no overt bleeding or infusion issues noted. Will increase drip rate slightly to maintain therapeutic level.  Goal of Therapy:  Heparin level 0.3-0.7 units/ml Monitor platelets by anticoagulation protocol: Yes   Plan:    Increase heparin infusion to 1350 units/hr Monitor daily HL, CBC, and s/sx bleeding F/u transition to oral antiacoagulation  Richardine Service, PharmD PGY1 Pharmacy Resident Phone: [phone] 01/19/2020  12:40 PM  Please check [email or web address] for unit-specific pharmacy phone numbers.

## 2020-01-19 NOTE — TOC Progression Note (Signed)
Transition of Care [facility]) - Progression Note    Patient Details  Name: [name] MRN: [id number] Date of Birth: [birth year]  Transition of Care [facility]) CM/SW [place], [place] Phone Number: [phone] 01/19/2020, 4:12 PM  Clinical Narrative:     CSW spoke with patient about bed offers and he stated that he was agreeable with [facility]. CSW clarified that he want to utilize his [employer] for SNF.  CSW reached out to [place] at [facility] to ascertain bed readiness, awaiting a call back.  TOC team will continue to assist with discharge planning needs.  Expected Discharge Plan: [place] Barriers to Discharge: Continued Medical Work up  Expected Discharge Plan and Services Expected Discharge Plan: [place] In-house Referral: Clinical Social Work Discharge Planning Services: CM Consult Post Acute Care Choice: [place] arrangements for the past 2 months: Single Family Home                                       Social Determinants of Health (SDOH) Interventions    Readmission Risk Interventions No flowsheet data found.

## 2020-01-20 LAB — HEPARIN LEVEL (UNFRACTIONATED): Heparin Unfractionated: 0.48 IU/mL (ref 0.30–0.70)

## 2020-01-20 LAB — GLUCOSE, CAPILLARY
Glucose-Capillary: 128 mg/dL — ABNORMAL HIGH (ref 70–99)
Glucose-Capillary: 226 mg/dL — ABNORMAL HIGH (ref 70–99)
Glucose-Capillary: 80 mg/dL (ref 70–99)
Glucose-Capillary: 211 mg/dL — ABNORMAL HIGH (ref 70–99)

## 2020-01-20 LAB — CBC
HCT: 32.1 % — ABNORMAL LOW (ref 39.0–52.0)
Hemoglobin: 10.2 g/dL — ABNORMAL LOW (ref 13.0–17.0)
MCH: 28.5 pg (ref 26.0–34.0)
MCHC: 31.8 g/dL (ref 30.0–36.0)
MCV: 89.7 fL (ref 80.0–100.0)
Platelets: 157 10*3/uL (ref 150–400)
RBC: 3.58 MIL/uL — ABNORMAL LOW (ref 4.22–5.81)
RDW: 12.7 % (ref 11.5–15.5)
WBC: 6.4 10*3/uL (ref 4.0–10.5)
nRBC: 0 % (ref 0.0–0.2)

## 2020-01-20 MED ORDER — APIXABAN 5 MG PO TABS
5.0000 mg | ORAL_TABLET | Freq: Two times a day (BID) | ORAL | Status: DC
  Administered 2020-01-20 – 2020-01-22 (×5): 5 mg via ORAL
  Filled 2020-01-20 (×5): qty 1

## 2020-01-20 NOTE — Discharge Instructions (Signed)

## 2020-01-20 NOTE — Progress Notes (Addendum)
  Progress Note    01/20/2020 7:53 AM 5 Days Post-Op  Subjective: no major complaints this morning. Has ambulated in room. States still having swelling and numbness in left leg with intermittent shooting pains. Says he is trying to    Vitals:   01/19/20 2329 01/20/20 0505  BP: 132/69 (!) 155/60  Pulse: 66 64  Resp: 16 18  Temp: 98.3 F (36.8 C) 97.7 F (36.5 C)  SpO2: 96% 99%   Physical Exam: Cardiac: regular Lungs: non labored Incisions:  Left groin incision clean, dry and intact without hematoma or swelling. Right femoral access site clean, dry and intact without hematoma Extremities: well perfused and warm. Left leg and foot edematous. Motor and sensory intact Abdomen:  Soft and non tender, non distended Neurologic: alert and oriented  CBC    Component Value Date/Time   WBC 6.4 01/20/2020 0254   RBC 3.58 (L) 01/20/2020 0254   HGB 10.2 (L) 01/20/2020 0254   HCT 32.1 (L) 01/20/2020 0254   PLT 157 01/20/2020 0254   MCV 89.7 01/20/2020 0254   MCH 28.5 01/20/2020 0254   MCHC 31.8 01/20/2020 0254   RDW 12.7 01/20/2020 0254   LYMPHSABS 1.5 01/16/2020 1652   MONOABS 0.8 01/16/2020 1652   EOSABS 0.1 01/16/2020 1652   BASOSABS 0.0 01/16/2020 1652    BMET    Component Value Date/Time   NA 139 01/19/2020 1537   K 4.1 01/19/2020 1537   CL 107 01/19/2020 1537   CO2 24 01/19/2020 1537   GLUCOSE 174 (H) 01/19/2020 1537   BUN 9 01/19/2020 1537   CREATININE 1.07 01/19/2020 1537   CALCIUM 8.6 (L) 01/19/2020 1537   GFRNONAA >60 01/19/2020 1537   GFRAA >60 01/19/2020 1537    INR    Component Value Date/Time   INR 1.1 01/15/2020 1108     Intake/Output Summary (Last 24 hours) at 01/20/2020 0753 Last data filed at 01/20/2020 0543 Gross per 24 hour  Intake 817.41 ml  Output 1305 ml  Net -487.59 ml     Assessment/Plan:  74 y.o. male is s/p endovascular aneurysm repair with thrombectomy of left iliac artery 5 Days Post-Op. Incisions c/d/i. Bilateral lower  extremities well perfused and warm. Continued pain management. PT recommending SNF. Patient is agreeable to go to [facility] when bed is available. Continue to mobilize.   DVT prophylaxis:  Heparin gtt   Karoline Caldwell, PA-C [facility] [phone] 01/20/2020 7:53 AM   I agree with the above.  He is still c/o right leg pain and edema.  Incisions looks good, no abdominal pain,  Will transition off heparin to Eliquis.  Will place compression stocking for edema.  SNF pending.  Annamarie Major

## 2020-01-20 NOTE — TOC Progression Note (Signed)
Transition of Care [facility]) - Progression Note    Patient Details  Name: [name] MRN: [id number] Date of Birth: March 24, 1946  Transition of Care [facility]) CM/SW [place], [place] Phone Number: 01/20/2020, 1:14 PM  Clinical Narrative:      CSW spoke with the patient's wife,Antionette. Patient's wife expressed she wants to pursue SNF approval with [employer] and VA benefits as secondary  because she wants the patient to discharge to [facility]. She states VA SNF placement would cause an hardship on her trying to get there to visit. Antionette states her mother is at [facility] and she is familiar with their facility and staff. CSW explained clinicals for VA approval was faxed but we can put VA SNF approval on hold and- CSW will fax clinicals to [facility] for insurance approval.   CSW confirmed bed offer with [facility].  CSW started insurance authorization w/[employer].    Thurmond Butts, MSW, [place] Clinical Social Worker   Expected Discharge Plan: Skilled Nursing Facility Barriers to Discharge: Continued Medical Work up  Expected Discharge Plan and Services Expected Discharge Plan: [place] In-house Referral: Clinical Social Work Discharge Planning Services: CM Consult Post Acute Care Choice: [place] arrangements for the past 2 months: Single Family Home                                       Social Determinants of Health (SDOH) Interventions    Readmission Risk Interventions No flowsheet data found.

## 2020-01-20 NOTE — Progress Notes (Addendum)
[place] for heparin > Eliquis Indication:  Atrial fibrillation   Allergies  Allergen Reactions  . Erythromycin Shortness Of Breath and Swelling  . Simvastatin Other (See Comments)    CAUSE MUSCLE PAIN,.  . Statins Other (See Comments)    Muscle pain    Patient Measurements: Height: 6\' 2"  (188 cm) Weight: 91.1 kg (200 lb 13.4 oz) IBW/kg (Calculated) : 82.2 Heparin Dosing Weight: 91.1kg  Vital Signs: Temp: 97.7 F (36.5 C) (05/30 0505) Temp Source: Oral (05/30 0505) BP: 155/60 (05/30 0505) Pulse Rate: 64 (05/30 0505)  Labs: Recent Labs    01/18/20 0241 01/18/20 0241 01/19/20 0404 01/19/20 1127 01/19/20 1537 01/20/20 0254  HGB 10.5*   < > 10.2*  --   --  10.2*  HCT 32.6*  --  32.1*  --   --  32.1*  PLT 129*  --  151  --   --  157  HEPARINUNFRC 0.37   < > 0.23* 0.30  --  0.48  CREATININE  --   --   --   --  1.07  --    < > = values in this interval not displayed.    Estimated Creatinine Clearance: 71.5 mL/min (by C-G formula based on SCr of 1.07 mg/dL).   Medical History: Past Medical History:  Diagnosis Date  . Asthma   . Coronary artery disease   . Diabetes mellitus without complication ([place])   . History of kidney stones   . Hypertension   . Peripheral vascular disease (HCC)     Medications:  Infusions:  . sodium chloride    . heparin 1,350 Units/hr (01/20/20 0543)  . lactated ringers Stopped (01/17/20 1347)  . magnesium sulfate bolus IVPB      Assessment: 74 y. o male presented to the ED on 01/14/20 with foot pain and possible arterial clot. No AC noted PTA. Was started on IV heparin which was stopped after endovascular aneurysm repair, LLE thromboembolectomy on 5/25. Pharmacy asked to restart IV heparin for atrial fibrillation.  CT abd/pelvis 5/26:  Negative for RP bleed, pseudoaneurysm or hematoma.  Heparin level remains therapeutic after increasing drip rate to 1350 units/hr. CBC stable, no overt bleeding  or infusion issues noted. Of note, patient is currently on triple therapy with heparin, aspirin, and Plavix.  Goal of Therapy:  Heparin level 0.3-0.7 units/ml Monitor platelets by anticoagulation protocol: Yes   Plan:    Continue heparin infusion at 1350 units/hr Monitor daily HL, CBC, and s/sx bleeding F/u transition to oral antiacoagulation F/u duration of triple therapy  Richardine Service, PharmD PGY1 Pharmacy Resident Phone: [phone] 01/20/2020  7:32 AM   ======================================= Addendum: Pharmacy asked to switch heparin to Eliquis.  Will stop heparin and start Eliquis 5mg  PO BID  (Scr <1.5, <80 yo, >60 kg)   Richardine Service, PharmD PGY1 Pharmacy Resident Phone: [phone] 01/20/2020  8:33 AM  Please check [email or web address] for unit-specific pharmacy phone numbers.

## 2020-01-21 LAB — GLUCOSE, CAPILLARY
Glucose-Capillary: 182 mg/dL — ABNORMAL HIGH (ref 70–99)
Glucose-Capillary: 148 mg/dL — ABNORMAL HIGH (ref 70–99)
Glucose-Capillary: 169 mg/dL — ABNORMAL HIGH (ref 70–99)
Glucose-Capillary: 85 mg/dL (ref 70–99)

## 2020-01-21 LAB — CBC
HCT: 35.3 % — ABNORMAL LOW (ref 39.0–52.0)
Hemoglobin: 11.1 g/dL — ABNORMAL LOW (ref 13.0–17.0)
MCH: 28.3 pg (ref 26.0–34.0)
MCHC: 31.4 g/dL (ref 30.0–36.0)
MCV: 90.1 fL (ref 80.0–100.0)
Platelets: 186 10*3/uL (ref 150–400)
RBC: 3.92 MIL/uL — ABNORMAL LOW (ref 4.22–5.81)
RDW: 12.9 % (ref 11.5–15.5)
WBC: 5.8 10*3/uL (ref 4.0–10.5)
nRBC: 0 % (ref 0.0–0.2)

## 2020-01-21 LAB — SARS CORONAVIRUS 2 BY RT PCR (HOSPITAL ORDER, PERFORMED IN ~~LOC~~ HOSPITAL LAB): SARS Coronavirus 2: NEGATIVE

## 2020-01-21 MED ORDER — POLYETHYLENE GLYCOL 3350 17 G PO PACK
17.0000 g | PACK | Freq: Two times a day (BID) | ORAL | Status: DC
  Administered 2020-01-21 – 2020-01-22 (×2): 17 g via ORAL
  Filled 2020-01-21 (×2): qty 1

## 2020-01-21 NOTE — TOC Progression Note (Signed)
Transition of Care [facility]) - Progression Note    Patient Details  Name: Timithy Arons MRN: [id number] Date of Birth: 1946/07/31  Transition of Care [facility]) CM/SW [place], [place] Phone Number: 01/21/2020, 10:23 AM  Clinical Narrative:     Received insurance authorization # [id number] 5/30-6/3 for [place]  Updated RN- requested covid test   Thurmond Butts, MSW, [place] Clinical Social Worker    Expected Discharge Plan: Skilled Nursing Facility Barriers to Discharge: Continued Medical Work up  Expected Discharge Plan and Services Expected Discharge Plan: [place] In-house Referral: Clinical Social Work Discharge Planning Services: CM Consult Post Acute Care Choice: [place] arrangements for the past 2 months: Single Family Home                                       Social Determinants of Health (SDOH) Interventions    Readmission Risk Interventions No flowsheet data found.

## 2020-01-21 NOTE — Progress Notes (Addendum)
  Progress Note    01/21/2020 7:50 AM 6 Days Post-Op  Subjective:  Continues to have intermittent shooting pains in left leg. Feels better this morning and swelling also somewhat better this morning. Did not tolerate ambulating as much yesterday afternoon/ evening due to increased left leg pain   Vitals:   01/21/20 0306 01/21/20 0749  BP: 132/67   Pulse: 60   Resp: 18   Temp: 97.7 F (36.5 C)   SpO2: 95% 97%   Physical Exam: General: well appearing, well nourished, sitting up in chair Cardiac: regular Lungs: non labored Incisions: left  Groin incision is clean, dry and intact. Soft tissue surrounding without signs of hematoma or fluid collection. No swelling Extremities: Well perfused and warm. Mild edema of left foot. Motor and sensory intact. Doppler PT bilaterally Abdomen:  Soft and non tender Neurologic: alert and oriented  CBC    Component Value Date/Time   WBC 6.4 01/20/2020 0254   RBC 3.58 (L) 01/20/2020 0254   HGB 10.2 (L) 01/20/2020 0254   HCT 32.1 (L) 01/20/2020 0254   PLT 157 01/20/2020 0254   MCV 89.7 01/20/2020 0254   MCH 28.5 01/20/2020 0254   MCHC 31.8 01/20/2020 0254   RDW 12.7 01/20/2020 0254   LYMPHSABS 1.5 01/16/2020 1652   MONOABS 0.8 01/16/2020 1652   EOSABS 0.1 01/16/2020 1652   BASOSABS 0.0 01/16/2020 1652    BMET    Component Value Date/Time   NA 139 01/19/2020 1537   K 4.1 01/19/2020 1537   CL 107 01/19/2020 1537   CO2 24 01/19/2020 1537   GLUCOSE 174 (H) 01/19/2020 1537   BUN 9 01/19/2020 1537   CREATININE 1.07 01/19/2020 1537   CALCIUM 8.6 (L) 01/19/2020 1537   GFRNONAA >60 01/19/2020 1537   GFRAA >60 01/19/2020 1537    INR    Component Value Date/Time   INR 1.1 01/15/2020 1108     Intake/Output Summary (Last 24 hours) at 01/21/2020 0750 Last data filed at 01/21/2020 7048 Gross per 24 hour  Intake 600 ml  Output 2075 ml  Net -1475 ml     Assessment/Plan:  74 y.o. male is s/p endovascular aneurysm repair with  thrombectomy of left iliac artery  6 Days Post-Op. Was transitioned from Heparin gtt to Eliquis yesterday. Incisions clean dry and intact. Bilateral lower extremities well perfused and warm. Continue pain control. Still having intermittent shooting pains and left lower extremity edema. Compression stockings today. Continue to try and mobilize today as tolerated. Hopefully discharge to SNF tomorrow as bed is available   DVT prophylaxis:  Eliquis   Karoline Caldwell, PA-C [facility] [phone] 01/21/2020 7:50 AM   I agree with the above. Left groin soft, no hematoma PT doppler signals Left leg edema - compression re-ordered today Suspect left leg pain is neuropathic given his pre-op ischemia time.  No compartment syndrome.  I have encouraged him to ambulate and be as mobile as possible.  Hopefully, this should improve with time Plan for D/c to SNF tomorrow   Annamarie Major

## 2020-01-21 NOTE — Progress Notes (Signed)
Physical Therapy Treatment Patient Details Name: [name] MRN: [id number] DOB: 1946-01-05 Today's Date: 01/21/2020    History of Present Illness Pt adm 5/24 with 3 month history of worsening lt foot pain. Pt found to have acute on chronic ischemic LLE. Pt also with know AAA. On 5/25 pt underwent lt femoral and external iliac thrombo embolectomy, stent of lt external iliac artery and endovascular aneurysm repair. On 5/26 pt developed afib with rvr and hypotension. PMH - CAD, DM, HTN, PVD, asthma, AAA    PT Comments    Patient progressing slowly towards PT goals. Reports continued pain in LLE. Pt reports trying to cut back on taking pain medicine however not able to tolerate activity even with it on board. Discussed this with pt. Tolerated short distance gait training with Min A and RW but limited due to pain and reluctant to place weight through LLE. Instructed pt in there ex. Continue to pre medicate as pt allows to improve activity. Continue to recommend SNF. Will follow.    Follow Up Recommendations  SNF     Equipment Recommendations  Rolling walker with 5" wheels    Recommendations for Other Services       Precautions / Restrictions Precautions Precautions: Fall Restrictions Weight Bearing Restrictions: No    Mobility  Bed Mobility               General bed mobility comments: Up in chair upon PT arrival.  Transfers Overall transfer level: Needs assistance Equipment used: Rolling walker (2 wheeled) Transfers: Sit to/from Stand Sit to Stand: Min assist         General transfer comment: Assist to power to standing with cues for hand placement. Reluctant to place weight through LLE; shaking in standing due to pain.  Ambulation/Gait Ambulation/Gait assistance: Min assist Gait Distance (Feet): 6 Feet(forwards/backwards) Assistive device: Rolling walker (2 wheeled) Gait Pattern/deviations: Step-to pattern;Decreased step length - right;Decreased stance time -  left;Antalgic;Trunk flexed Gait velocity: decr Gait velocity interpretation: <1.31 ft/sec, indicative of household ambulator General Gait Details: Slow, unsteady gait with decreased stance time LLE, shaking due to pain. limiting distance. Walked forwards/backwards.   Stairs             Wheelchair Mobility    Modified Rankin (Stroke Patients Only)       Balance Overall balance assessment: Needs assistance Sitting-balance support: No upper extremity supported;Feet supported Sitting balance-Leahy Scale: Fair     Standing balance support: During functional activity Standing balance-Leahy Scale: Poor Standing balance comment: walker and min assist for static standing. Pt very tremulous, reports due to pain.                            Cognition Arousal/Alertness: Awake/alert Behavior During Therapy: WFL for tasks assessed/performed Overall Cognitive Status: Within Functional Limits for tasks assessed                                 General Comments: Reports trying not to take any pain meds however not able to tolerate much activity without them. Discussed this with pt.      Exercises General Exercises - Lower Extremity Ankle Circles/Pumps: AROM;Both;10 reps;Seated Quad Sets: AROM;Both;10 reps;Seated    General Comments General comments (skin integrity, edema, etc.): VSS.      Pertinent Vitals/Pain Pain Assessment: 0-10 Pain Score: 8  Pain Location: LLE- groin, knee and ankle Pain Descriptors /  Indicators: Grimacing;Guarding;Sharp;Shooting Pain Intervention(s): Repositioned;Monitored during session;Limited activity within patient's tolerance;Premedicated before session    Home Living                      Prior Function            PT Goals (current goals can now be found in the care plan section) Progress towards PT goals: Progressing toward goals    Frequency    Min 3X/week      PT Plan Current plan remains  appropriate    Co-evaluation              AM-PAC PT "6 Clicks" Mobility   Outcome Measure  Help needed turning from your back to your side while in a flat bed without using bedrails?: None Help needed moving from lying on your back to sitting on the side of a flat bed without using bedrails?: A Little Help needed moving to and from a bed to a chair (including a wheelchair)?: A Little Help needed standing up from a chair using your arms (e.g., wheelchair or bedside chair)?: A Little Help needed to walk in hospital room?: A Little Help needed climbing 3-5 steps with a railing? : A Lot 6 Click Score: 18    End of Session Equipment Utilized During Treatment: Gait belt Activity Tolerance: Patient limited by pain Patient left: with call bell/phone within reach;in chair Nurse Communication: Mobility status PT Visit Diagnosis: Unsteadiness on feet (R26.81);Other abnormalities of gait and mobility (R26.89);Pain Pain - Right/Left: Left Pain - part of body: Ankle and joints of foot;Leg     Time: 8811-0315 PT Time Calculation (min) (ACUTE ONLY): 21 min  Charges:  $Therapeutic Activity: 8-22 mins                     Marisa Severin, PT, DPT Acute Rehabilitation Services Pager [phone] Office [place] 01/21/2020, 12:08 PM

## 2020-01-22 DIAGNOSIS — E118 Type 2 diabetes mellitus with unspecified complications: Secondary | ICD-10-CM | POA: Diagnosis not present

## 2020-01-22 DIAGNOSIS — R600 Localized edema: Secondary | ICD-10-CM | POA: Diagnosis not present

## 2020-01-22 DIAGNOSIS — E860 Dehydration: Secondary | ICD-10-CM | POA: Diagnosis not present

## 2020-01-22 DIAGNOSIS — R42 Dizziness and giddiness: Secondary | ICD-10-CM | POA: Diagnosis not present

## 2020-01-22 DIAGNOSIS — R531 Weakness: Secondary | ICD-10-CM | POA: Diagnosis not present

## 2020-01-22 DIAGNOSIS — E1140 Type 2 diabetes mellitus with diabetic neuropathy, unspecified: Secondary | ICD-10-CM | POA: Diagnosis not present

## 2020-01-22 DIAGNOSIS — J4540 Moderate persistent asthma, uncomplicated: Secondary | ICD-10-CM | POA: Diagnosis not present

## 2020-01-22 DIAGNOSIS — R262 Difficulty in walking, not elsewhere classified: Secondary | ICD-10-CM | POA: Diagnosis not present

## 2020-01-22 DIAGNOSIS — Z7901 Long term (current) use of anticoagulants: Secondary | ICD-10-CM | POA: Diagnosis not present

## 2020-01-22 DIAGNOSIS — M79662 Pain in left lower leg: Secondary | ICD-10-CM | POA: Diagnosis not present

## 2020-01-22 DIAGNOSIS — J45909 Unspecified asthma, uncomplicated: Secondary | ICD-10-CM | POA: Diagnosis not present

## 2020-01-22 DIAGNOSIS — R031 Nonspecific low blood-pressure reading: Secondary | ICD-10-CM | POA: Diagnosis not present

## 2020-01-22 DIAGNOSIS — G8918 Other acute postprocedural pain: Secondary | ICD-10-CM | POA: Diagnosis not present

## 2020-01-22 DIAGNOSIS — Z87891 Personal history of nicotine dependence: Secondary | ICD-10-CM | POA: Diagnosis not present

## 2020-01-22 DIAGNOSIS — I739 Peripheral vascular disease, unspecified: Secondary | ICD-10-CM | POA: Diagnosis not present

## 2020-01-22 DIAGNOSIS — R2242 Localized swelling, mass and lump, left lower limb: Secondary | ICD-10-CM | POA: Diagnosis not present

## 2020-01-22 DIAGNOSIS — M62838 Other muscle spasm: Secondary | ICD-10-CM | POA: Diagnosis not present

## 2020-01-22 DIAGNOSIS — R0602 Shortness of breath: Secondary | ICD-10-CM | POA: Diagnosis not present

## 2020-01-22 DIAGNOSIS — M2550 Pain in unspecified joint: Secondary | ICD-10-CM | POA: Diagnosis not present

## 2020-01-22 DIAGNOSIS — Z7984 Long term (current) use of oral hypoglycemic drugs: Secondary | ICD-10-CM | POA: Diagnosis not present

## 2020-01-22 DIAGNOSIS — I7389 Other specified peripheral vascular diseases: Secondary | ICD-10-CM | POA: Diagnosis not present

## 2020-01-22 DIAGNOSIS — E559 Vitamin D deficiency, unspecified: Secondary | ICD-10-CM | POA: Diagnosis not present

## 2020-01-22 DIAGNOSIS — R252 Cramp and spasm: Secondary | ICD-10-CM | POA: Diagnosis not present

## 2020-01-22 DIAGNOSIS — R52 Pain, unspecified: Secondary | ICD-10-CM | POA: Diagnosis not present

## 2020-01-22 DIAGNOSIS — Z720 Tobacco use: Secondary | ICD-10-CM | POA: Diagnosis not present

## 2020-01-22 DIAGNOSIS — I714 Abdominal aortic aneurysm, without rupture: Secondary | ICD-10-CM | POA: Diagnosis not present

## 2020-01-22 DIAGNOSIS — I480 Paroxysmal atrial fibrillation: Secondary | ICD-10-CM | POA: Diagnosis not present

## 2020-01-22 DIAGNOSIS — E119 Type 2 diabetes mellitus without complications: Secondary | ICD-10-CM | POA: Diagnosis not present

## 2020-01-22 DIAGNOSIS — Z7401 Bed confinement status: Secondary | ICD-10-CM | POA: Diagnosis not present

## 2020-01-22 DIAGNOSIS — R5381 Other malaise: Secondary | ICD-10-CM | POA: Diagnosis not present

## 2020-01-22 DIAGNOSIS — I2510 Atherosclerotic heart disease of native coronary artery without angina pectoris: Secondary | ICD-10-CM | POA: Diagnosis not present

## 2020-01-22 DIAGNOSIS — M6281 Muscle weakness (generalized): Secondary | ICD-10-CM | POA: Diagnosis not present

## 2020-01-22 DIAGNOSIS — R5383 Other fatigue: Secondary | ICD-10-CM | POA: Diagnosis not present

## 2020-01-22 DIAGNOSIS — M79605 Pain in left leg: Secondary | ICD-10-CM | POA: Diagnosis present

## 2020-01-22 DIAGNOSIS — N401 Enlarged prostate with lower urinary tract symptoms: Secondary | ICD-10-CM | POA: Diagnosis not present

## 2020-01-22 DIAGNOSIS — I951 Orthostatic hypotension: Secondary | ICD-10-CM | POA: Diagnosis not present

## 2020-01-22 DIAGNOSIS — Z79899 Other long term (current) drug therapy: Secondary | ICD-10-CM | POA: Diagnosis not present

## 2020-01-22 DIAGNOSIS — M545 Low back pain: Secondary | ICD-10-CM | POA: Diagnosis not present

## 2020-01-22 DIAGNOSIS — R9431 Abnormal electrocardiogram [ECG] [EKG]: Secondary | ICD-10-CM | POA: Diagnosis not present

## 2020-01-22 DIAGNOSIS — I10 Essential (primary) hypertension: Secondary | ICD-10-CM | POA: Diagnosis not present

## 2020-01-22 LAB — GLUCOSE, CAPILLARY: Glucose-Capillary: 138 mg/dL — ABNORMAL HIGH (ref 70–99)

## 2020-01-22 MED ORDER — OXYCODONE-ACETAMINOPHEN 5-325 MG PO TABS: 1.0000 | ORAL_TABLET | Freq: Four times a day (QID) | ORAL | 0 refills | Status: DC | PRN

## 2020-01-22 MED ORDER — APIXABAN 5 MG PO TABS: 5.0000 mg | ORAL_TABLET | Freq: Two times a day (BID) | ORAL | 0 refills | Status: DC

## 2020-01-22 NOTE — Progress Notes (Addendum)
[facility] [place]  Subjective  - States left LE has decreased edema.   Objective 135/75 72 97.6 F (36.4 C) (Oral) 18 97%  Intake/Output Summary (Last 24 hours) at 01/22/2020 0726 Last data filed at 01/22/2020 0656 Gross per 24 hour  Intake --  Output 1300 ml  Net -1300 ml   Tolerating PO's without abdominal pain or lumbar pain. Groins soft NTTP Feet warm and well perfused, left LE edema Lungs non labored   Assessment/Planning: 74 y.o. male is s/p endovascular aneurysm repair with thrombectomy of left iliac artery 7 Days Post-Op  On Eliquis AFib controlled Compression thigh high in room Plan for discharge to SNF BP WNL, afebrile stable condition  Roxy Horseman 01/22/2020 7:26 AM --  Laboratory Lab Results: Recent Labs    01/20/20 0254 01/21/20 0806  WBC 6.4 5.8  HGB 10.2* 11.1*  HCT 32.1* 35.3*  PLT 157 186   BMET Recent Labs    01/19/20 1537  NA 139  K 4.1  CL 107  CO2 24  GLUCOSE 174*  BUN 9  CREATININE 1.07  CALCIUM 8.6*    COAG Lab Results  Component Value Date   INR 1.1 01/15/2020   INR 1.0 01/14/2020   INR 1.0 12/17/2019   No results found for: PTT   I agree with the above.  Patient now has thigh-high compression stockings in place.  His left groin remains stable however he does complain of stabbing pain.  There is no fluctuance or fluid collection present.  No evidence of infection.  Patient is scheduled to be discharged to SNF today.  He is very concerned about the co-pay required for his Eliquis.  We will try to assist with this so that he is not left with a big bill.  Annamarie Major

## 2020-01-22 NOTE — Progress Notes (Signed)
Physical Therapy Treatment Patient Details Name: [name] MRN: [id number] DOB: September 28, 1945 Today's Date: 01/22/2020    History of Present Illness Pt adm 5/24 with 3 month history of worsening lt foot pain. Pt found to have acute on chronic ischemic LLE. Pt also with know AAA. On 5/25 pt underwent lt femoral and external iliac thrombo embolectomy, stent of lt external iliac artery and endovascular aneurysm repair. On 5/26 pt developed afib with rvr and hypotension. PMH - CAD, DM, HTN, PVD, asthma, AAA    PT Comments    Patient progressing well towards PT goals. Improved ambulation distance today with Min guard assist for balance/safety. Fatigues easily and requires 2 standing rest breaks. Pain is much improved today after donning compression socks. Continues to report some discomfort in his left heel. VSS on RA. Requires Min A to stand from surfaces. Eager to get to rehab and then home. Will follow.   Follow Up Recommendations  SNF     Equipment Recommendations  Rolling walker with 5" wheels    Recommendations for Other Services       Precautions / Restrictions Precautions Precautions: Fall Restrictions Weight Bearing Restrictions: No    Mobility  Bed Mobility Overal bed mobility: Needs Assistance Bed Mobility: Supine to Sit     Supine to sit: Supervision;HOB elevated Sit to supine: Supervision;HOB elevated   General bed mobility comments: Use of rail, no physical assist needed.  Transfers Overall transfer level: Needs assistance Equipment used: Rolling walker (2 wheeled) Transfers: Sit to/from Stand Sit to Stand: Min assist         General transfer comment: Assist to power to standing with cues for hand placement. Stood from [employer], from chair x1  Ambulation/Gait Ambulation/Gait assistance: Counsellor (Feet): 50 Feet Assistive device: Rolling walker (2 wheeled) Gait Pattern/deviations: Step-through pattern;Decreased stride length Gait velocity:  decreased   General Gait Details: Slow, steady gait with RW for support; 2 standing rest breaks for SOB. VSS.   Stairs             Wheelchair Mobility    Modified Rankin (Stroke Patients Only)       Balance Overall balance assessment: Needs assistance Sitting-balance support: No upper extremity supported;Feet supported Sitting balance-Leahy Scale: Fair     Standing balance support: During functional activity Standing balance-Leahy Scale: Poor Standing balance comment: Close Min guard for safety. Standing to urinate.                            Cognition Arousal/Alertness: Awake/alert Behavior During Therapy: WFL for tasks assessed/performed Overall Cognitive Status: Within Functional Limits for tasks assessed                                 General Comments: Sense of humor today with wife present.      Exercises      General Comments General comments (skin integrity, edema, etc.): VSS, wife present during session.      Pertinent Vitals/Pain Pain Assessment: Faces Faces Pain Scale: Hurts a little bit Pain Location: left heel Pain Descriptors / Indicators: Sore Pain Intervention(s): Monitored during session;Repositioned    Home Living                      Prior Function            PT Goals (current goals can now be  found in the care plan section) Progress towards PT goals: Progressing toward goals    Frequency    Min 3X/week      PT Plan Current plan remains appropriate    Co-evaluation              AM-PAC PT "6 Clicks" Mobility   Outcome Measure  Help needed turning from your back to your side while in a flat bed without using bedrails?: None Help needed moving from lying on your back to sitting on the side of a flat bed without using bedrails?: None Help needed moving to and from a bed to a chair (including a wheelchair)?: A Little Help needed standing up from a chair using your arms (e.g.,  wheelchair or bedside chair)?: A Little Help needed to walk in hospital room?: A Little Help needed climbing 3-5 steps with a railing? : A Lot 6 Click Score: 19    End of Session Equipment Utilized During Treatment: Gait belt Activity Tolerance: Patient limited by fatigue Patient left: in bed;with call bell/phone within reach;with family/visitor present Nurse Communication: Mobility status PT Visit Diagnosis: Unsteadiness on feet (R26.81);Other abnormalities of gait and mobility (R26.89);Pain Pain - Right/Left: Left Pain - part of body: Ankle and joints of foot     Time: 8675-4492 PT Time Calculation (min) (ACUTE ONLY): 23 min  Charges:  $Gait Training: 8-22 mins $Therapeutic Activity: 8-22 mins                     Marisa Severin, PT, DPT Acute Rehabilitation Services Pager [phone] Office [place] 01/22/2020, 11:57 AM

## 2020-01-22 NOTE — Progress Notes (Signed)
01/22/2020 1:39 PM Attempted to call report to [facility] at [phone] answer)-number listed in SW note.   Carney Corners

## 2020-01-22 NOTE — Progress Notes (Signed)
01/22/2020 1330 Discharge AVS meds taken today and those due this evening reviewed.  Follow-up appointments and when to call md reviewed.  D/C IV and TELE.  Questions and concerns addressed.   D/C to [facility] via [place]. Carney Corners

## 2020-01-22 NOTE — Discharge Summary (Signed)
[facility] Discharge Summary   Patient ID:  [name] MRN: [id number] DOB/AGE: 1946/04/09 74 y.o.  Admit date: 01/14/2020 Discharge date: 01/22/2020 Date of Surgery: 01/15/2020 Surgeon: Surgeon(s): Waynetta Sandy, MD  Admission Diagnosis: Preop cardiovascular exam [D35.701] AAA (abdominal aortic aneurysm) without rupture [facility]) [I71.4]  Discharge Diagnoses:  Preop cardiovascular exam [X79.390] AAA (abdominal aortic aneurysm) without rupture ([place]) [I71.4]  Secondary Diagnoses: Past Medical History:  Diagnosis Date  . Asthma   . Coronary artery disease   . Diabetes mellitus without complication ([place])   . History of kidney stones   . Hypertension   . Peripheral vascular disease ([place])     Procedure(s): ENDOVASCULAR ANEURYSM REPAIR INSERTION OF LEFT EXTERNAL ILIAC ARTERY STENT THROMBECTOMY OF LEFT ILIAC ARTERY Left Lower Extremity Angiogram  Discharged Condition: good  HPI: 74 y/o male with 3 month history of left LE pain.  He was previously seen and evaluated by Dr. 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  The patient was scheduled for follow-up at the [facility] for this.   Pt CTA reviewed aneurysm is now 5 cm diameter and he has thrombosis of the left common iliac artery.  Plan for repair of AAA on 5/25 with left leg thrombectomy and possible fem fem    Hospital Course:  Watson Robarge is a 74 y.o. male is S/P  Procedure(s): ENDOVASCULAR ANEURYSM REPAIR INSERTION OF LEFT EXTERNAL ILIAC ARTERY STENT THROMBECTOMY OF LEFT ILIAC ARTERY Left Lower Extremity Angiogram POD#1 Faintly palpable femoral pulses; L foot with only venous like PTA signal by doppler duplex ordered. Summary:  Right: 50-74% stenosis noted in the mid superficial femoral artery.  Monophasic waveforms are noted throughout the left lower arterial system.  Left LE mild to mod edema.     Postoperative course complicated yesterday by onset of atrial fibrillation with RVR and hypotension.    Cardiology consulted today the patient is hemodynamically stable and in normal sinus rhythm.  Now on Eliquis.  BP within normal limits.    Compression thigh highs to be worn daily for left LE edema, may be removed at night.  Disposition stable for discharge to SNF.      Consults:  Treatment Team:  Elam Dutch, MD Waynetta Sandy, MD  Significant Diagnostic Studies: CBC Lab Results  Component Value Date   WBC 5.8 01/21/2020   HGB 11.1 (L) 01/21/2020   HCT 35.3 (L) 01/21/2020   MCV 90.1 01/21/2020   PLT 186 01/21/2020    BMET    Component Value Date/Time   NA 139 01/19/2020 1537   K 4.1 01/19/2020 1537   CL 107 01/19/2020 1537   CO2 24 01/19/2020 1537   GLUCOSE 174 (H) 01/19/2020 1537   BUN 9 01/19/2020 1537   CREATININE 1.07 01/19/2020 1537   CALCIUM 8.6 (L) 01/19/2020 1537   GFRNONAA >60 01/19/2020 1537   GFRAA >60 01/19/2020 1537   COAG Lab Results  Component Value Date   INR 1.1 01/15/2020   INR 1.0 01/14/2020   INR 1.0 12/17/2019     Disposition:  Discharge to :Skilled nursing facility Discharge Instructions    Call MD for:  redness, tenderness, or signs of infection (pain, swelling, bleeding, redness, odor or green/yellow discharge around incision site)   Complete by: As directed    Call MD for:  redness, tenderness, or signs of infection (pain, swelling, bleeding, redness, odor or green/yellow  discharge around incision site)   Complete by: As directed    Call MD for:  severe or increased pain, loss or decreased feeling  in affected limb(s)   Complete by: As directed    Call MD for:  severe or increased pain, loss or decreased feeling  in affected limb(s)   Complete by: As directed    Call MD for:  temperature >100.5   Complete by: As directed    Call MD for:  temperature >100.5   Complete by: As directed    Resume previous diet    Complete by: As directed    Resume previous diet   Complete by: As directed      Allergies as of 01/22/2020      Reactions   Erythromycin Shortness Of Breath, Swelling   Simvastatin Other (See Comments)   CAUSE MUSCLE PAIN,.   Statins Other (See Comments)   Muscle pain      Medication List    STOP taking these medications   HYDROcodone-acetaminophen 5-325 MG tablet Commonly known as: NORCO/VICODIN     TAKE these medications   acetaminophen 500 MG tablet Commonly known as: TYLENOL Take 1,000 mg by mouth 2 (two) times daily as needed for mild pain or headache ([place]).   albuterol 108 (90 Base) MCG/ACT inhaler Commonly known as: VENTOLIN HFA Inhale into the lungs every 6 (six) hours as needed for wheezing or shortness of breath. Inhale 2 puffs every 6 hours as needed for breathing   apixaban 5 MG Tabs tablet Commonly known as: ELIQUIS Take 1 tablet (5 mg total) by mouth 2 (two) times daily.   buPROPion 150 MG 12 hr tablet Commonly known as: ZYBAN Take 150 mg by mouth daily.   cholecalciferol 25 MCG (1000 UNIT) tablet Commonly known as: VITAMIN D Take 1,000 Units by mouth daily.   clopidogrel 75 MG tablet Commonly known as: PLAVIX Take 75 mg by mouth daily.   diclofenac sodium 1 % Gel Commonly known as: VOLTAREN Apply 4 g topically 3 (three) times daily as needed (pain).   doxazosin 1 MG tablet Commonly known as: CARDURA Take 1 mg by mouth daily.   finasteride 5 MG tablet Commonly known as: PROSCAR Take 5 mg by mouth daily.   gabapentin 300 MG capsule Commonly known as: NEURONTIN Take 600 mg by mouth 2 (two) times daily.   hydroxypropyl methylcellulose / hypromellose 2.5 % ophthalmic solution Commonly known as: ISOPTO TEARS / GONIOVISC Place 1-2 drops into both eyes 3 (three) times a week.   isosorbide mononitrate 30 MG 24 hr tablet Commonly known as: IMDUR Take 30 mg by mouth daily.   lisinopril 10 MG tablet Commonly known as: ZESTRIL Take 10 mg by  mouth daily.   loratadine 10 MG tablet Commonly known as: CLARITIN Take 10 mg by mouth daily.   Lysine 1000 MG Tabs Take 1,000 mg by mouth 2 (two) times daily.   meclizine 25 MG tablet Commonly known as: ANTIVERT Take 25 mg by mouth 3 (three) times daily as needed for dizziness or nausea.   metFORMIN 500 MG 24 hr tablet Commonly known as: GLUCOPHAGE-XR Take 1,000 mg by mouth in the morning and at bedtime.   nitroGLYCERIN 0.4 MG SL tablet Commonly known as: NITROSTAT Place 0.4 mg under the tongue every 5 (five) minutes as needed for chest pain.   Olodaterol HCl 2.5 MCG/ACT Aers Inhale 2 puffs into the lungs daily.   oxyCODONE-acetaminophen 5-325 MG tablet Commonly known as: PERCOCET/ROXICET Take 1 tablet  by mouth every 6 (six) hours as needed for moderate pain.   petrolatum-hydrophilic-aloe vera ointment Apply 1 application topically 3 (three) times daily.   rosuvastatin 20 MG tablet Commonly known as: CRESTOR Take 20 mg by mouth daily.   sildenafil 100 MG tablet Commonly known as: VIAGRA Take 100 mg by mouth daily as needed for erectile dysfunction.   tamsulosin 0.4 MG Caps capsule Commonly known as: FLOMAX Take 0.4 mg by mouth at bedtime.            Durable Medical Equipment  (From admission, onward)         Start     Ordered   01/20/20 0755  For home use only DME Walker rolling  Once    Question Answer Comment  Walker: With 5 Inch Wheels   Patient needs a walker to treat with the following condition Peripheral arterial occlusive disease ([place])      01/20/20 0755         Verbal and written Discharge instructions given to the patient. Wound care per Discharge AVS Follow-up Information    Waynetta Sandy, MD Follow up in 4 week(s).   Specialties: Vascular Surgery, Cardiology Why: office will call Contact information: [place] [address] [phone]           Signed: Roxy Horseman 01/22/2020, 8:06 AM - For  VQI Registry use --- Instructions: Press F2 to tab through selections.  Delete question if not applicable.   Post-op:  Time to Extubation: [x ] In OR, [ ]  < 12 hrs, [ ]  12-24 hrs, [ ]  >=24 hrs Vasopressors Req. Post-op: No MI: [x ] No, [ ]  Troponin only, [ ]  EKG or Clinical New Arrhythmia: Yes CHF: No ICU Stay: 0 days Transfusion: No  If yes, 0 units given  Complications: Resp failure: [x ] none, [ ]  Pneumonia, [ ]  Ventilator Chg in renal function: [x ] none, [ ]  Inc. Cr > 0.5, [ ]  Temp. Dialysis, [ ]  Permanent dialysis Leg ischemia: [ ]  No, [ ]  Yes, no Surgery needed, [x ] Yes, Surgery needed, [ ]  Amputation Bowel ischemia: x[ ]  No, [ ]  Medical Rx, [ ]  Surgical Rx Wound complication: [x ] No, [ ]  Superficial separation/infection, [ ]  Return to OR Return to OR: No  Return to OR for bleeding: No Stroke: [x ] None, [ ]  Minor, [ ]  Major  Discharge medications: Statin use:  No  for medical reason   ASA use:  No  for medical reason   Plavix use:  Yes Beta blocker use:  No  for medical reason

## 2020-01-22 NOTE — TOC Transition Note (Addendum)
Transition of Care (TOC) - CM/SW Discharge Note *Discharged to [facility] via non-emergency ambulance *Room 122A *Number for Report: [phone]    Patient Details  Name: [name] MRN: [id number] Date of Birth: Aug 30, 1945  Transition of Care [facility]) CM/SW Contact:  Sable Feil, LCSW Phone Number: [phone] 01/22/2020, 11:17 AM   Clinical Narrative:  Patient medically stable for discharge to [facility] today. Admissions liaison Juliann Pulse contacted regarding patient's readiness for discharge and discharge clinicals transmitted to facility. Wife contacted [phone]) and advised of discharge. Mr. Borghi will be transported by non-emergency ambulance.      Final next level of care: Skilled Nursing Facility([facility] - Room 122A) Barriers to Discharge: Barriers Resolved(Insurance authorrizaton received)   Patient Goals and CMS Choice Patient states their goals for this hospitalization and ongoing recovery are:: To be able to navigate at home   Choice offered to / list presented to : Patient  Discharge Placement              Patient chooses bed at: [facility] Patient to be transferred to facility by: Non-emergency ambulance Name of family member notified: Antionette Hickling - wife Patient and family notified of of transfer: 01/22/20  Discharge Plan and Services In-house Referral: Clinical Social Work Discharge Planning Services: [employer] Consult Post Acute Care Choice: [place]                               Social Determinants of Health (SDOH) Interventions  No SDOH interventions needed or requested at discharge.   Readmission Risk Interventions No flowsheet data found.

## 2020-01-23 DIAGNOSIS — E1140 Type 2 diabetes mellitus with diabetic neuropathy, unspecified: Secondary | ICD-10-CM | POA: Diagnosis not present

## 2020-01-23 DIAGNOSIS — Z7901 Long term (current) use of anticoagulants: Secondary | ICD-10-CM | POA: Diagnosis not present

## 2020-01-23 DIAGNOSIS — M79605 Pain in left leg: Secondary | ICD-10-CM | POA: Diagnosis not present

## 2020-01-23 DIAGNOSIS — I739 Peripheral vascular disease, unspecified: Secondary | ICD-10-CM | POA: Diagnosis not present

## 2020-01-24 DIAGNOSIS — I739 Peripheral vascular disease, unspecified: Secondary | ICD-10-CM | POA: Diagnosis not present

## 2020-01-24 DIAGNOSIS — I480 Paroxysmal atrial fibrillation: Secondary | ICD-10-CM | POA: Diagnosis not present

## 2020-01-24 DIAGNOSIS — I10 Essential (primary) hypertension: Secondary | ICD-10-CM | POA: Diagnosis not present

## 2020-01-24 DIAGNOSIS — R531 Weakness: Secondary | ICD-10-CM | POA: Diagnosis not present

## 2020-01-25 DIAGNOSIS — R0602 Shortness of breath: Secondary | ICD-10-CM | POA: Diagnosis not present

## 2020-01-25 DIAGNOSIS — R031 Nonspecific low blood-pressure reading: Secondary | ICD-10-CM | POA: Diagnosis not present

## 2020-01-25 DIAGNOSIS — M62838 Other muscle spasm: Secondary | ICD-10-CM | POA: Diagnosis not present

## 2020-01-25 DIAGNOSIS — R42 Dizziness and giddiness: Secondary | ICD-10-CM | POA: Diagnosis not present

## 2020-01-25 DIAGNOSIS — I951 Orthostatic hypotension: Secondary | ICD-10-CM | POA: Diagnosis not present

## 2020-01-25 DIAGNOSIS — R5383 Other fatigue: Secondary | ICD-10-CM | POA: Diagnosis not present

## 2020-01-25 DIAGNOSIS — M79605 Pain in left leg: Secondary | ICD-10-CM | POA: Diagnosis not present

## 2020-01-28 DIAGNOSIS — Z7901 Long term (current) use of anticoagulants: Secondary | ICD-10-CM | POA: Diagnosis not present

## 2020-01-28 DIAGNOSIS — E860 Dehydration: Secondary | ICD-10-CM | POA: Diagnosis not present

## 2020-01-28 DIAGNOSIS — R600 Localized edema: Secondary | ICD-10-CM | POA: Diagnosis not present

## 2020-01-28 DIAGNOSIS — E1140 Type 2 diabetes mellitus with diabetic neuropathy, unspecified: Secondary | ICD-10-CM | POA: Diagnosis not present

## 2020-01-28 DIAGNOSIS — I739 Peripheral vascular disease, unspecified: Secondary | ICD-10-CM | POA: Diagnosis not present

## 2020-01-28 DIAGNOSIS — M79605 Pain in left leg: Secondary | ICD-10-CM | POA: Diagnosis not present

## 2020-01-29 ENCOUNTER — Ambulatory Visit: Payer: Medicare PPO | Admitting: Podiatry

## 2020-01-29 DIAGNOSIS — M79605 Pain in left leg: Secondary | ICD-10-CM | POA: Diagnosis not present

## 2020-01-29 DIAGNOSIS — R031 Nonspecific low blood-pressure reading: Secondary | ICD-10-CM | POA: Diagnosis not present

## 2020-01-29 DIAGNOSIS — M79662 Pain in left lower leg: Secondary | ICD-10-CM | POA: Diagnosis not present

## 2020-01-29 DIAGNOSIS — R5383 Other fatigue: Secondary | ICD-10-CM | POA: Diagnosis not present

## 2020-01-29 DIAGNOSIS — I951 Orthostatic hypotension: Secondary | ICD-10-CM | POA: Diagnosis not present

## 2020-01-29 DIAGNOSIS — R42 Dizziness and giddiness: Secondary | ICD-10-CM | POA: Diagnosis not present

## 2020-01-29 DIAGNOSIS — I10 Essential (primary) hypertension: Secondary | ICD-10-CM | POA: Diagnosis not present

## 2020-01-29 DIAGNOSIS — R262 Difficulty in walking, not elsewhere classified: Secondary | ICD-10-CM | POA: Diagnosis not present

## 2020-01-29 DIAGNOSIS — R0602 Shortness of breath: Secondary | ICD-10-CM | POA: Diagnosis not present

## 2020-01-29 DIAGNOSIS — M6281 Muscle weakness (generalized): Secondary | ICD-10-CM | POA: Diagnosis not present

## 2020-01-29 DIAGNOSIS — R5381 Other malaise: Secondary | ICD-10-CM | POA: Diagnosis not present

## 2020-01-30 DIAGNOSIS — R031 Nonspecific low blood-pressure reading: Secondary | ICD-10-CM | POA: Diagnosis not present

## 2020-01-30 DIAGNOSIS — I951 Orthostatic hypotension: Secondary | ICD-10-CM | POA: Diagnosis not present

## 2020-01-30 DIAGNOSIS — R5381 Other malaise: Secondary | ICD-10-CM | POA: Diagnosis not present

## 2020-01-30 DIAGNOSIS — R5383 Other fatigue: Secondary | ICD-10-CM | POA: Diagnosis not present

## 2020-01-30 DIAGNOSIS — E1140 Type 2 diabetes mellitus with diabetic neuropathy, unspecified: Secondary | ICD-10-CM | POA: Diagnosis not present

## 2020-01-30 DIAGNOSIS — R0602 Shortness of breath: Secondary | ICD-10-CM | POA: Diagnosis not present

## 2020-01-30 DIAGNOSIS — I739 Peripheral vascular disease, unspecified: Secondary | ICD-10-CM | POA: Diagnosis not present

## 2020-01-30 DIAGNOSIS — M79605 Pain in left leg: Secondary | ICD-10-CM | POA: Diagnosis not present

## 2020-01-30 DIAGNOSIS — I10 Essential (primary) hypertension: Secondary | ICD-10-CM | POA: Diagnosis not present

## 2020-01-31 ENCOUNTER — Telehealth: Payer: Self-pay

## 2020-01-31 ENCOUNTER — Emergency Department (HOSPITAL_COMMUNITY)
Admission: EM | Admit: 2020-01-31 | Discharge: 2020-01-31 | Disposition: A | Discharge status: Home / Self Care | Payer: No Typology Code available for payment source | Attending: Emergency Medicine | Admitting: Emergency Medicine

## 2020-01-31 DIAGNOSIS — E119 Type 2 diabetes mellitus without complications: Secondary | ICD-10-CM | POA: Insufficient documentation

## 2020-01-31 DIAGNOSIS — I951 Orthostatic hypotension: Secondary | ICD-10-CM | POA: Diagnosis not present

## 2020-01-31 DIAGNOSIS — Z7984 Long term (current) use of oral hypoglycemic drugs: Secondary | ICD-10-CM | POA: Insufficient documentation

## 2020-01-31 DIAGNOSIS — J45909 Unspecified asthma, uncomplicated: Secondary | ICD-10-CM | POA: Insufficient documentation

## 2020-01-31 DIAGNOSIS — G8918 Other acute postprocedural pain: Secondary | ICD-10-CM | POA: Insufficient documentation

## 2020-01-31 DIAGNOSIS — I739 Peripheral vascular disease, unspecified: Secondary | ICD-10-CM | POA: Diagnosis not present

## 2020-01-31 DIAGNOSIS — M545 Low back pain: Secondary | ICD-10-CM | POA: Diagnosis not present

## 2020-01-31 DIAGNOSIS — M79605 Pain in left leg: Secondary | ICD-10-CM | POA: Insufficient documentation

## 2020-01-31 DIAGNOSIS — Z79899 Other long term (current) drug therapy: Secondary | ICD-10-CM | POA: Diagnosis not present

## 2020-01-31 DIAGNOSIS — R252 Cramp and spasm: Secondary | ICD-10-CM | POA: Diagnosis not present

## 2020-01-31 DIAGNOSIS — Z87891 Personal history of nicotine dependence: Secondary | ICD-10-CM | POA: Diagnosis not present

## 2020-01-31 DIAGNOSIS — R031 Nonspecific low blood-pressure reading: Secondary | ICD-10-CM | POA: Diagnosis not present

## 2020-01-31 DIAGNOSIS — Z7901 Long term (current) use of anticoagulants: Secondary | ICD-10-CM | POA: Insufficient documentation

## 2020-01-31 MED ORDER — OXYCODONE-ACETAMINOPHEN 5-325 MG PO TABS
1.0000 | ORAL_TABLET | Freq: Once | ORAL | Status: AC
  Administered 2020-01-31: 1 via ORAL
  Filled 2020-01-31: qty 1

## 2020-01-31 NOTE — Consult Note (Signed)
Hospital Consult    Reason for Consult:  Left leg pain Referring Physician:  Dr. Ashok Cordia MRN #:  [id number]  History of Present Illness: This is a 74 y.o. male status post left lower extremity thromboembolectomy and endovascular aneurysm repair this was done for abdominal aortic aneurysm with left lower extremity acute on chronic ischemia.  Postoperatively patient has significant pain that was treated with Neurontin.  He is now in a nursing home they are concerned about bleeding from the left groin access site and also for his ongoing left foot pain.  Patient has had some borderline hypotension but is not eating.  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  Overall he is wearing his compression sock he is able to walk but does have significant pain particularly in the lower leg.  Past Medical History:  Diagnosis Date  . Asthma   . Coronary artery disease   . Diabetes mellitus without complication ([place])   . History of kidney stones   . Hypertension   . Peripheral vascular disease [facility])     Past Surgical History:  Procedure Laterality Date  . CARDIAC CATHETERIZATION N/A 07/05/2016   Procedure: Left Heart Cath and Coronary Angiography;  Surgeon: Sherren Mocha, MD;  Location: [place] [facility];  Service: Cardiovascular;  Laterality: N/A;  . CORONARY ANGIOPLASTY    . ENDOVASCULAR STENT INSERTION N/A 01/15/2020   Procedure: ENDOVASCULAR ANEURYSM REPAIR;  Surgeon: Waynetta Sandy, MD;  Location: [place];  Service: Vascular;  Laterality: N/A;  . heart stents  2006  . INSERTION OF ILIAC STENT Left 01/15/2020   Procedure: INSERTION OF LEFT EXTERNAL ILIAC ARTERY STENT;  Surgeon: Waynetta Sandy, MD;  Location: [place];  Service: Vascular;  Laterality: Left;  . LOWER EXTREMITY ANGIOGRAM  01/15/2020   Procedure: Left Lower Extremity Angiogram;  Surgeon: Waynetta Sandy, MD;  Location: [place];  Service: Vascular;;    . THROMBECTOMY ILIAC ARTERY  01/15/2020   Procedure: THROMBECTOMY OF LEFT ILIAC ARTERY;  Surgeon: Waynetta Sandy, MD;  Location: [place];  Service: Vascular;;    Allergies  Allergen Reactions  . Erythromycin Shortness Of Breath and Swelling  . Simvastatin Other (See Comments)    CAUSE MUSCLE PAIN,.  . Statins Other (See Comments)    Muscle pain    Prior to Admission medications   Medication Sig Start Date End Date Taking? Authorizing Provider  acetaminophen (TYLENOL) 500 MG tablet Take 1,000 mg by mouth 2 (two) times daily as needed for mild pain or headache (Cramps).    [provider]  albuterol (PROVENTIL HFA;VENTOLIN HFA) 108 (90 BASE) MCG/ACT inhaler Inhale into the lungs every 6 (six) hours as needed for wheezing or shortness of breath. Inhale 2 puffs every 6 hours as needed for breathing    [provider]  apixaban (ELIQUIS) 5 MG TABS tablet Take 1 tablet (5 mg total) by mouth 2 (two) times daily. 01/22/20   Ulyses Amor, PA-C  buPROPion (ZYBAN) 150 MG 12 hr tablet Take 150 mg by mouth daily.     [provider]  cholecalciferol (VITAMIN D) 1000 units tablet Take 1,000 Units by mouth daily.     [provider]  clopidogrel (PLAVIX) 75 MG tablet Take 75 mg by mouth daily.    [provider]  diclofenac sodium (VOLTAREN) 1 % GEL Apply 4 g topically 3 (three) times daily as needed (pain).  [provider]  doxazosin (CARDURA) 1 MG tablet Take 1 mg by mouth daily.    [provider]  finasteride (PROSCAR) 5 MG tablet Take 5 mg by mouth daily.    [provider]  gabapentin (NEURONTIN) 300 MG capsule Take 600 mg by mouth 2 (two) times daily.     [provider]  hydroxypropyl methylcellulose / hypromellose (ISOPTO TEARS / GONIOVISC) 2.5 % ophthalmic solution Place 1-2 drops into both eyes 3 (three) times a week.     [provider]  isosorbide mononitrate (IMDUR) 30 MG 24 hr tablet  Take 30 mg by mouth daily.    [provider]  lisinopril (PRINIVIL,ZESTRIL) 10 MG tablet Take 10 mg by mouth daily.  09/27/18   [provider]  loratadine (CLARITIN) 10 MG tablet Take 10 mg by mouth daily.    [provider]  Lysine 1000 MG TABS Take 1,000 mg by mouth 2 (two) times daily.    [provider]  meclizine (ANTIVERT) 25 MG tablet Take 25 mg by mouth 3 (three) times daily as needed for dizziness or nausea.     [provider]  metFORMIN (GLUCOPHAGE-XR) 500 MG 24 hr tablet Take 1,000 mg by mouth in the morning and at bedtime.  10/17/18   [provider]  nitroGLYCERIN (NITROSTAT) 0.4 MG SL tablet Place 0.4 mg under the tongue every 5 (five) minutes as needed for chest pain.    [provider]  Olodaterol HCl 2.5 MCG/ACT AERS Inhale 2 puffs into the lungs daily.    [provider]  oxyCODONE-acetaminophen (PERCOCET/ROXICET) 5-325 MG tablet Take 1 tablet by mouth every 6 (six) hours as needed for moderate pain. 01/22/20   Ulyses Amor, PA-C  petrolatum-hydrophilic-aloe vera (ALOE VESTA) ointment Apply 1 application topically 3 (three) times daily.    [provider]  rosuvastatin (CRESTOR) 20 MG tablet Take 20 mg by mouth daily.    [provider]  sildenafil (VIAGRA) 100 MG tablet Take 100 mg by mouth daily as needed for erectile dysfunction.    [provider]  tamsulosin (FLOMAX) 0.4 MG CAPS capsule Take 0.4 mg by mouth at bedtime.     [provider]    Social History   Socioeconomic History  . Marital status: Married    Spouse name: Not on file  . Number of children: Not on file  . Years of education: Not on file  . Highest education level: Not on file  Occupational History  . Not on file  Tobacco Use  . Smoking status: Former Research scientist (life sciences)  . Smokeless tobacco: Never Used  Vaping Use  . Vaping Use: Never used  Substance and Sexual Activity  . Alcohol use: No     Alcohol/week: 0.0 standard drinks  . Drug use: No  . Sexual activity: Not on file  Other Topics Concern  . Not on file  Social History Narrative  . Not on file   Social Determinants of Health   Financial Resource Strain:   . Difficulty of Paying Living Expenses:   Food Insecurity:   . Worried About Charity fundraiser in the Last Year:   . Arboriculturist in the Last Year:   Transportation Needs:   . Film/video editor (Medical):   Marland Kitchen Lack of Transportation (Non-Medical):   Physical Activity:   . Days of Exercise per Week:   . Minutes of Exercise per Session:   Stress:   . Feeling of  Stress :   Social Connections:   . Frequency of Communication with Friends and Family:   . Frequency of Social Gatherings with Friends and Family:   . Attends Religious Services:   . Active Member of Clubs or Organizations:   . Attends Archivist Meetings:   Marland Kitchen Marital Status:   Intimate Partner Violence:   . Fear of Current or Ex-Partner:   . Emotionally Abused:   Marland Kitchen Physically Abused:   . Sexually Abused:     Family History  Problem Relation Age of Onset  . CAD Mother   . CAD Brother     ROS: Left leg pain   Physical Examination  Vitals:   01/31/20 1543 01/31/20 1601  BP:    Pulse:    Resp:  16  Temp: 98.7 F (37.1 C)   SpO2:     There is no height or weight on file to calculate BMI.  General:  nad HENT: WNL, normocephalic Pulmonary: normal non-labored breathing Cardiac: palbable femoral pulses, popliteal pulses Has peroneal and dorsalis pedis signals on the left Abdomen: soft, NT/ND Extremities: Left foot is warm well perfused, second toe discoloration is improving Induration in the left groin at the site of incision Musculoskeletal: no muscle wasting or atrophy  Neurologic: A&O X 3   CBC    Component Value Date/Time   WBC 5.8 01/21/2020 0806   RBC 3.92 (L) 01/21/2020 0806   HGB 11.1 (L) 01/21/2020 0806   HCT 35.3 (L) 01/21/2020 0806   PLT 186  01/21/2020 0806   MCV 90.1 01/21/2020 0806   MCH 28.3 01/21/2020 0806   MCHC 31.4 01/21/2020 0806   RDW 12.9 01/21/2020 0806   LYMPHSABS 1.5 01/16/2020 1652   MONOABS 0.8 01/16/2020 1652   EOSABS 0.1 01/16/2020 1652   BASOSABS 0.0 01/16/2020 1652    BMET    Component Value Date/Time   NA 139 01/19/2020 1537   K 4.1 01/19/2020 1537   CL 107 01/19/2020 1537   CO2 24 01/19/2020 1537   GLUCOSE 174 (H) 01/19/2020 1537   BUN 9 01/19/2020 1537   CREATININE 1.07 01/19/2020 1537   CALCIUM 8.6 (L) 01/19/2020 1537   GFRNONAA >60 01/19/2020 1537   GFRAA >60 01/19/2020 1537    COAGS: Lab Results  Component Value Date   INR 1.1 01/15/2020   INR 1.0 01/14/2020   INR 1.0 12/17/2019     Non-Invasive Vascular Imaging:   None performed  ASSESSMENT/PLAN: This is a 74 y.o. male presents for evaluation of left leg pain with concerning for left groin bleed.  He has had some borderline hypotension at the nursing facility but he does not appear to be bleeding his left groin has expected postoperative change.  Left leg continues to be painful this is likely secondary to reperfusion given the patient's ischemic changes for 3 weeks.  Toe ischemic changes appear to be improving he has good flow all the way to the foot on the left side.  I would not recommend any further intervention from vascular at this time and he can keep his regular scheduled follow-up    Daysen Gundrum C. Donzetta Matters, MD [facility] Office: [phone] Pager: [phone]

## 2020-01-31 NOTE — ED Provider Notes (Signed)
[facility] EMERGENCY DEPARTMENT Provider Note   CSN: [id number] Arrival date & time: 01/31/20  1536     History Chief Complaint  Patient presents with   Leg Pain    [name] is a 74 y.o. male.  The history is provided by the patient and medical records. No language interpreter was used.  Leg Pain    74 year old male with history of diabetes, hypertension, AAA, peripheral vascular disease are brought here via EMS from [facility] for evaluation of leg pain.  Patient was admitted on 5/3:24 month history of worsening left foot pain.  Patient was found to have an acute on chronic ischemic left lower extremity with known history of AAA.  On 5/25 patient underwent left femoral and external iliac thromboembolectomy with stenting to the left external iliac artery as well as having an endovascular aneurysm repair.  He is currently at a rehab facility.  Patient states since the surgery he has had recurrent sharp shooting pain throughout his left leg.  Pain happens sporadically but has been recurrent.  He was evaluated by her provider at his facility today and was recommended to come to ER for further evaluation by his vascular surgeon.  He has been taken pain medication without relief.  He does not complain of any fever worsening chest pain productive cough or increased leg swelling.  No complaint of back pain.  He has been going through rehab and wear compressive stocking.  Past Medical History:  Diagnosis Date   Asthma    Coronary artery disease    Diabetes mellitus without complication ([place])    History of kidney stones    Hypertension    Peripheral vascular disease [facility])     Patient Active Problem List   Diagnosis Date Noted   AAA (abdominal aortic aneurysm) without rupture ([place]) 01/15/2020   Tobacco abuse 06/02/2017   CAD (coronary artery disease) 07/12/2016   Bradycardia 07/06/2016   Chest pain with high risk for cardiac etiology  07/03/2016   Hypokalemia 07/03/2016   DM (diabetes mellitus) ([place]) 07/03/2016   Essential hypertension 07/03/2016   Gross hematuria 07/03/2016    Past Surgical History:  Procedure Laterality Date   CARDIAC CATHETERIZATION N/A 07/05/2016   Procedure: Left Heart Cath and Coronary Angiography;  Surgeon: Sherren Mocha, MD;  Location: [place] [facility];  Service: Cardiovascular;  Laterality: N/A;   CORONARY ANGIOPLASTY     ENDOVASCULAR STENT INSERTION N/A 01/15/2020   Procedure: ENDOVASCULAR ANEURYSM REPAIR;  Surgeon: Waynetta Sandy, MD;  Location: [place];  Service: Vascular;  Laterality: N/A;   heart stents  2006   INSERTION OF ILIAC STENT Left 01/15/2020   Procedure: INSERTION OF LEFT EXTERNAL ILIAC ARTERY STENT;  Surgeon: Waynetta Sandy, MD;  Location: [place];  Service: Vascular;  Laterality: Left;   LOWER EXTREMITY ANGIOGRAM  01/15/2020   Procedure: Left Lower Extremity Angiogram;  Surgeon: Waynetta Sandy, MD;  Location: [place];  Service: Vascular;;   THROMBECTOMY ILIAC ARTERY  01/15/2020   Procedure: THROMBECTOMY OF LEFT ILIAC ARTERY;  Surgeon: Waynetta Sandy, MD;  Location: [facility] OR;  Service: Vascular;;       Family History  Problem Relation Age of Onset   CAD Mother    CAD Brother     Social History   Tobacco Use   Smoking status: Former Smoker   Smokeless tobacco: Never Used  Scientific laboratory technician Use: Never used  Substance Use Topics   Alcohol use: No  Alcohol/week: 0.0 standard drinks   Drug use: No    Home Medications Prior to Admission medications   Medication Sig Start Date End Date Taking? Authorizing Provider  acetaminophen (TYLENOL) 500 MG tablet Take 1,000 mg by mouth 2 (two) times daily as needed for mild pain or headache (Cramps).    [provider]  albuterol (PROVENTIL HFA;VENTOLIN HFA) 108 (90 BASE) MCG/ACT inhaler Inhale into the lungs every 6 (six) hours as needed for wheezing or  shortness of breath. Inhale 2 puffs every 6 hours as needed for breathing    [provider]  apixaban (ELIQUIS) 5 MG TABS tablet Take 1 tablet (5 mg total) by mouth 2 (two) times daily. 01/22/20   Ulyses Amor, PA-C  buPROPion (ZYBAN) 150 MG 12 hr tablet Take 150 mg by mouth daily.     [provider]  cholecalciferol (VITAMIN D) 1000 units tablet Take 1,000 Units by mouth daily.     [provider]  clopidogrel (PLAVIX) 75 MG tablet Take 75 mg by mouth daily.    [provider]  diclofenac sodium (VOLTAREN) 1 % GEL Apply 4 g topically 3 (three) times daily as needed (pain).     [provider]  doxazosin (CARDURA) 1 MG tablet Take 1 mg by mouth daily.    [provider]  finasteride (PROSCAR) 5 MG tablet Take 5 mg by mouth daily.    [provider]  gabapentin (NEURONTIN) 300 MG capsule Take 600 mg by mouth 2 (two) times daily.     [provider]  hydroxypropyl methylcellulose / hypromellose (ISOPTO TEARS / GONIOVISC) 2.5 % ophthalmic solution Place 1-2 drops into both eyes 3 (three) times a week.     [provider]  isosorbide mononitrate (IMDUR) 30 MG 24 hr tablet Take 30 mg by mouth daily.    [provider]  lisinopril (PRINIVIL,ZESTRIL) 10 MG tablet Take 10 mg by mouth daily.  09/27/18   [provider]  loratadine (CLARITIN) 10 MG tablet Take 10 mg by mouth daily.    [provider]  Lysine 1000 MG TABS Take 1,000 mg by mouth 2 (two) times daily.    [provider]  meclizine (ANTIVERT) 25 MG tablet Take 25 mg by mouth 3 (three) times daily as needed for dizziness or nausea.     [provider]  metFORMIN (GLUCOPHAGE-XR) 500 MG 24 hr tablet Take 1,000 mg by mouth in the morning and at bedtime.  10/17/18   [provider]  nitroGLYCERIN (NITROSTAT) 0.4 MG SL tablet Place 0.4 mg under the tongue every 5 (five) minutes as needed for chest pain.    [provider]  Olodaterol HCl 2.5 MCG/ACT AERS Inhale 2 puffs into the lungs daily.    [provider]  oxyCODONE-acetaminophen (PERCOCET/ROXICET) 5-325 MG tablet Take 1 tablet by mouth every 6 (six) hours as needed for moderate pain. 01/22/20   Ulyses Amor, PA-C  petrolatum-hydrophilic-aloe vera (ALOE VESTA) ointment Apply 1 application topically 3 (three) times daily.    [provider]  rosuvastatin (CRESTOR) 20 MG tablet Take 20 mg by mouth daily.    [provider]  sildenafil (VIAGRA) 100 MG tablet Take 100 mg by mouth daily as needed for erectile dysfunction.    [provider]  tamsulosin (FLOMAX) 0.4 MG CAPS capsule Take 0.4 mg by mouth at bedtime.     [provider]    Allergies    Erythromycin, Simvastatin, and Statins  Review of Systems   Review of Systems  All other systems reviewed and are negative.   Physical Exam Updated Vital Signs There were no vitals taken for this visit.  Physical Exam Vitals and nursing note reviewed.  Constitutional:      General: He is not in acute distress.    Appearance: He is well-developed.  HENT:     Head: Atraumatic.  Eyes:     Conjunctiva/sclera: Conjunctivae normal.  Cardiovascular:     Rate and Rhythm: Normal rate and regular rhythm.     Pulses: Normal pulses.     Heart sounds: Normal heart sounds.  Pulmonary:     Effort: Pulmonary effort is normal.     Breath sounds: Normal breath sounds.  Abdominal:     Palpations: Abdomen is soft. There is no mass.     Tenderness: There is no abdominal tenderness. There is no guarding or rebound.     Comments: Abdomen nontender.  No pulsatile mass.  Genitourinary:    Comments: Well-appearing surgical scar in the left groin without signs of infection.  No obvious hematoma appreciated Musculoskeletal:     Cervical back: Neck supple.     Comments: Patellar deep tendon reflex intact bilaterally.  Left lower extremity without significant edema  erythema or warmth.  Dorsalis pedis pulse palpable with intact cap refill.  Skin:    Findings: No rash.  Neurological:     Mental Status: He is alert.     ED Results / Procedures / Treatments   Labs (all labs ordered are listed, but only abnormal results are displayed) Labs Reviewed - No data to display  EKG None  Radiology No results found.  Procedures Procedures (including critical care time)  Medications Ordered in ED Medications - No data to display  ED Course  I have reviewed the triage vital signs and the nursing notes.  Pertinent labs & imaging results that were available during my care of the patient were reviewed by me and considered in my medical decision making (see chart for details).    MDM Rules/Calculators/A&P                          BP (!) 142/68 (BP Location: Right Arm)    Pulse 78    Temp 98.7 F (37.1 C) (Oral)    Resp 16    SpO2 97%   Final Clinical Impression(s) / ED Diagnoses Final diagnoses:  Left leg pain    Rx / DC Orders ED Discharge Orders    None     4:20 PM Patient recently had a left femoral and external iliac thromboembolectomy 2 weeks ago here with recurrent pain to his left lower extremity.  Provider at his nursing facility was concern for expanding hematoma.  On exam, no obvious hematoma appreciated, left leg with normal appearance, sensation intact, pedal pulse palpable.  Suspect pain from revascularization of the lower extremity.  Care discussed with Dr. Albertine Patricia.  Will consult vascular surgeon for recheck.  Appreciate consultation from vascular surgeon Dr. Donzetta Matters who will see and evaluate patient in the ER.  Pain medication given.  6:39 PM Vascular surgeon Dr. Donzetta Matters has seen evaluate patient.  He examined the patient and felt that patient is progressing appropriately.  Suspect pain is likely secondary to reperfusion.  He encourage patient to keep his follow-up appointment as previously scheduled.  Otherwise he patient is stable  for discharge.   Domenic Moras, PA-C 01/31/20 1841  Lajean Saver, MD 02/01/20 1630

## 2020-01-31 NOTE — Telephone Encounter (Signed)
Telephone call received from nursing facility. NP onsite reports pt having a hematoma at left groin that has enlarged over the past day with severe pain and burning and requested pt to be seen.   Return call placed to facility. Gerri Lins, PA spoke with the NP at facility about pt symptoms. Pt was advised to go to ER for further evaluation. Voiced understanding. Minette Brine, RN

## 2020-01-31 NOTE — ED Triage Notes (Signed)
Pt arrives BIB by [facility] for leg pain, ongoing for several months per pt but worsening this week along with development of hematoma per staff of [employer]. Bps also low for SNF, [facility] VSS.

## 2020-01-31 NOTE — Discharge Instructions (Signed)
Your leg pain is likely due to reperfusion of your leg.  Your blood flow to your leg has improved.  Continue taking medication previously prescribed and follow-up with your specialist as previously scheduled.

## 2020-01-31 NOTE — ED Notes (Signed)
[facility] called @ 1858-per Emmy, RN called by Levada Dy

## 2020-02-01 DIAGNOSIS — R262 Difficulty in walking, not elsewhere classified: Secondary | ICD-10-CM | POA: Diagnosis not present

## 2020-02-01 DIAGNOSIS — M79605 Pain in left leg: Secondary | ICD-10-CM | POA: Diagnosis not present

## 2020-02-01 DIAGNOSIS — R252 Cramp and spasm: Secondary | ICD-10-CM | POA: Diagnosis not present

## 2020-02-01 DIAGNOSIS — R5381 Other malaise: Secondary | ICD-10-CM | POA: Diagnosis not present

## 2020-02-01 DIAGNOSIS — I739 Peripheral vascular disease, unspecified: Secondary | ICD-10-CM | POA: Diagnosis not present

## 2020-02-01 DIAGNOSIS — M6281 Muscle weakness (generalized): Secondary | ICD-10-CM | POA: Diagnosis not present

## 2020-02-05 DIAGNOSIS — I739 Peripheral vascular disease, unspecified: Secondary | ICD-10-CM | POA: Diagnosis not present

## 2020-02-05 DIAGNOSIS — M62838 Other muscle spasm: Secondary | ICD-10-CM | POA: Diagnosis not present

## 2020-02-05 DIAGNOSIS — M79605 Pain in left leg: Secondary | ICD-10-CM | POA: Diagnosis not present

## 2020-02-07 DIAGNOSIS — I714 Abdominal aortic aneurysm, without rupture: Secondary | ICD-10-CM | POA: Diagnosis not present

## 2020-02-07 DIAGNOSIS — I2510 Atherosclerotic heart disease of native coronary artery without angina pectoris: Secondary | ICD-10-CM | POA: Diagnosis not present

## 2020-02-07 DIAGNOSIS — J4540 Moderate persistent asthma, uncomplicated: Secondary | ICD-10-CM | POA: Diagnosis not present

## 2020-02-07 DIAGNOSIS — I10 Essential (primary) hypertension: Secondary | ICD-10-CM | POA: Diagnosis not present

## 2020-02-07 DIAGNOSIS — M6281 Muscle weakness (generalized): Secondary | ICD-10-CM | POA: Diagnosis not present

## 2020-02-07 DIAGNOSIS — I7389 Other specified peripheral vascular diseases: Secondary | ICD-10-CM | POA: Diagnosis not present

## 2020-02-07 DIAGNOSIS — E559 Vitamin D deficiency, unspecified: Secondary | ICD-10-CM | POA: Diagnosis not present

## 2020-02-07 DIAGNOSIS — E1140 Type 2 diabetes mellitus with diabetic neuropathy, unspecified: Secondary | ICD-10-CM | POA: Diagnosis not present

## 2020-02-13 DIAGNOSIS — J4540 Moderate persistent asthma, uncomplicated: Secondary | ICD-10-CM | POA: Diagnosis not present

## 2020-02-13 DIAGNOSIS — E1151 Type 2 diabetes mellitus with diabetic peripheral angiopathy without gangrene: Secondary | ICD-10-CM | POA: Diagnosis not present

## 2020-02-13 DIAGNOSIS — I2510 Atherosclerotic heart disease of native coronary artery without angina pectoris: Secondary | ICD-10-CM | POA: Diagnosis not present

## 2020-02-13 DIAGNOSIS — Z7901 Long term (current) use of anticoagulants: Secondary | ICD-10-CM | POA: Diagnosis not present

## 2020-02-13 DIAGNOSIS — I714 Abdominal aortic aneurysm, without rupture: Secondary | ICD-10-CM | POA: Diagnosis not present

## 2020-02-13 DIAGNOSIS — E559 Vitamin D deficiency, unspecified: Secondary | ICD-10-CM | POA: Diagnosis not present

## 2020-02-13 DIAGNOSIS — I10 Essential (primary) hypertension: Secondary | ICD-10-CM | POA: Diagnosis not present

## 2020-02-13 DIAGNOSIS — E1140 Type 2 diabetes mellitus with diabetic neuropathy, unspecified: Secondary | ICD-10-CM | POA: Diagnosis not present

## 2020-02-15 DIAGNOSIS — E1140 Type 2 diabetes mellitus with diabetic neuropathy, unspecified: Secondary | ICD-10-CM | POA: Diagnosis not present

## 2020-02-15 DIAGNOSIS — I714 Abdominal aortic aneurysm, without rupture: Secondary | ICD-10-CM | POA: Diagnosis not present

## 2020-02-15 DIAGNOSIS — E559 Vitamin D deficiency, unspecified: Secondary | ICD-10-CM | POA: Diagnosis not present

## 2020-02-15 DIAGNOSIS — I10 Essential (primary) hypertension: Secondary | ICD-10-CM | POA: Diagnosis not present

## 2020-02-15 DIAGNOSIS — J4540 Moderate persistent asthma, uncomplicated: Secondary | ICD-10-CM | POA: Diagnosis not present

## 2020-02-15 DIAGNOSIS — I2510 Atherosclerotic heart disease of native coronary artery without angina pectoris: Secondary | ICD-10-CM | POA: Diagnosis not present

## 2020-02-15 DIAGNOSIS — Z7901 Long term (current) use of anticoagulants: Secondary | ICD-10-CM | POA: Diagnosis not present

## 2020-02-15 DIAGNOSIS — E1151 Type 2 diabetes mellitus with diabetic peripheral angiopathy without gangrene: Secondary | ICD-10-CM | POA: Diagnosis not present

## 2020-02-21 DIAGNOSIS — I714 Abdominal aortic aneurysm, without rupture: Secondary | ICD-10-CM | POA: Diagnosis not present

## 2020-02-21 DIAGNOSIS — E559 Vitamin D deficiency, unspecified: Secondary | ICD-10-CM | POA: Diagnosis not present

## 2020-02-21 DIAGNOSIS — I10 Essential (primary) hypertension: Secondary | ICD-10-CM | POA: Diagnosis not present

## 2020-02-21 DIAGNOSIS — J4540 Moderate persistent asthma, uncomplicated: Secondary | ICD-10-CM | POA: Diagnosis not present

## 2020-02-21 DIAGNOSIS — I2510 Atherosclerotic heart disease of native coronary artery without angina pectoris: Secondary | ICD-10-CM | POA: Diagnosis not present

## 2020-02-21 DIAGNOSIS — E1151 Type 2 diabetes mellitus with diabetic peripheral angiopathy without gangrene: Secondary | ICD-10-CM | POA: Diagnosis not present

## 2020-02-21 DIAGNOSIS — Z7901 Long term (current) use of anticoagulants: Secondary | ICD-10-CM | POA: Diagnosis not present

## 2020-02-21 DIAGNOSIS — E1140 Type 2 diabetes mellitus with diabetic neuropathy, unspecified: Secondary | ICD-10-CM | POA: Diagnosis not present

## 2020-02-22 ENCOUNTER — Encounter: Payer: Self-pay | Admitting: Vascular Surgery

## 2020-02-22 ENCOUNTER — Ambulatory Visit (INDEPENDENT_AMBULATORY_CARE_PROVIDER_SITE_OTHER): Payer: Self-pay | Admitting: Vascular Surgery

## 2020-02-22 ENCOUNTER — Other Ambulatory Visit: Payer: Self-pay

## 2020-02-22 VITALS — BP 113/79 | HR 88 | Temp 97.7°F | Resp 20 | Ht 74.0 in | Wt 200.5 lb

## 2020-02-22 DIAGNOSIS — Z7901 Long term (current) use of anticoagulants: Secondary | ICD-10-CM | POA: Diagnosis not present

## 2020-02-22 DIAGNOSIS — I2510 Atherosclerotic heart disease of native coronary artery without angina pectoris: Secondary | ICD-10-CM | POA: Diagnosis not present

## 2020-02-22 DIAGNOSIS — I10 Essential (primary) hypertension: Secondary | ICD-10-CM | POA: Diagnosis not present

## 2020-02-22 DIAGNOSIS — J4540 Moderate persistent asthma, uncomplicated: Secondary | ICD-10-CM | POA: Diagnosis not present

## 2020-02-22 DIAGNOSIS — E559 Vitamin D deficiency, unspecified: Secondary | ICD-10-CM | POA: Diagnosis not present

## 2020-02-22 DIAGNOSIS — I714 Abdominal aortic aneurysm, without rupture, unspecified: Secondary | ICD-10-CM

## 2020-02-22 DIAGNOSIS — E1151 Type 2 diabetes mellitus with diabetic peripheral angiopathy without gangrene: Secondary | ICD-10-CM | POA: Diagnosis not present

## 2020-02-22 DIAGNOSIS — E1140 Type 2 diabetes mellitus with diabetic neuropathy, unspecified: Secondary | ICD-10-CM | POA: Diagnosis not present

## 2020-02-22 NOTE — Progress Notes (Signed)
    Subjective:     Patient ID: [name], male   DOB: 29-Jan-1946, 74 y.o.   MRN: [id number]  HPI 74yo male s/p:   1.  Open exposure of left common femoral artery with left common external iliac thromboembolectomy 2.  Percutaneous access closure right common femoral artery 3.  Endovascular aneurysm repair with main body right 23 x 14 x 12 cm extended with 14 x 7 cm and contralateral limb left 12 x 14 cm extended with 10 x 7 cm 4.  Stent of left external iliac artery with 7 x 60 [place] 5.  Left lower extremity angiography  Pain improving left leg but still having swelling. No wounds. Plan to have nails    Review of Systems Left leg improving     Objective:   Physical Exam   Vitals:   02/22/20 0846  BP: 113/79  Pulse: 88  Resp: 20  Temp: 97.7 F (36.5 C)  SpO2: 96%   aaox3 Non labored  Bilateral common femoral pulses are palpable Left groin wound is palpable 1+ popliteal pulses bilateral     CTA IMPRESSION: 1. No evidence of complication following the recent endovascular procedure. The aortic endograft appears appropriately positioned and patent. No obvious endoleak on the limited arterial imaging and a delayed venous phase. 2. Patent left external iliac artery stent. Mild stranding around the left common femoral artery with small amount of subcutaneous air in the left groin after femoral cutdown. No significant hematoma or evidence of pseudoaneurysm.   Assessment:     74yo aam s/p EVAR with left EIA stent with left leg pain improving.    Plan:     F/u 6 months with evar duplex and ABI     Angeleena Dueitt C. Donzetta Matters, MD [facility] [place] Office: [phone] Pager: [phone]

## 2020-02-23 DIAGNOSIS — Z7901 Long term (current) use of anticoagulants: Secondary | ICD-10-CM | POA: Diagnosis not present

## 2020-02-23 DIAGNOSIS — J4540 Moderate persistent asthma, uncomplicated: Secondary | ICD-10-CM | POA: Diagnosis not present

## 2020-02-23 DIAGNOSIS — I10 Essential (primary) hypertension: Secondary | ICD-10-CM | POA: Diagnosis not present

## 2020-02-23 DIAGNOSIS — E1140 Type 2 diabetes mellitus with diabetic neuropathy, unspecified: Secondary | ICD-10-CM | POA: Diagnosis not present

## 2020-02-23 DIAGNOSIS — E1151 Type 2 diabetes mellitus with diabetic peripheral angiopathy without gangrene: Secondary | ICD-10-CM | POA: Diagnosis not present

## 2020-02-23 DIAGNOSIS — I714 Abdominal aortic aneurysm, without rupture: Secondary | ICD-10-CM | POA: Diagnosis not present

## 2020-02-23 DIAGNOSIS — I2510 Atherosclerotic heart disease of native coronary artery without angina pectoris: Secondary | ICD-10-CM | POA: Diagnosis not present

## 2020-02-23 DIAGNOSIS — E559 Vitamin D deficiency, unspecified: Secondary | ICD-10-CM | POA: Diagnosis not present

## 2020-02-26 ENCOUNTER — Other Ambulatory Visit: Payer: Self-pay | Admitting: *Deleted

## 2020-02-26 DIAGNOSIS — M7989 Other specified soft tissue disorders: Secondary | ICD-10-CM

## 2020-02-26 DIAGNOSIS — I714 Abdominal aortic aneurysm, without rupture, unspecified: Secondary | ICD-10-CM

## 2020-02-28 DIAGNOSIS — J4540 Moderate persistent asthma, uncomplicated: Secondary | ICD-10-CM | POA: Diagnosis not present

## 2020-02-28 DIAGNOSIS — I714 Abdominal aortic aneurysm, without rupture: Secondary | ICD-10-CM | POA: Diagnosis not present

## 2020-02-28 DIAGNOSIS — I10 Essential (primary) hypertension: Secondary | ICD-10-CM | POA: Diagnosis not present

## 2020-02-28 DIAGNOSIS — Z7901 Long term (current) use of anticoagulants: Secondary | ICD-10-CM | POA: Diagnosis not present

## 2020-02-28 DIAGNOSIS — E559 Vitamin D deficiency, unspecified: Secondary | ICD-10-CM | POA: Diagnosis not present

## 2020-02-28 DIAGNOSIS — I2510 Atherosclerotic heart disease of native coronary artery without angina pectoris: Secondary | ICD-10-CM | POA: Diagnosis not present

## 2020-02-28 DIAGNOSIS — E1151 Type 2 diabetes mellitus with diabetic peripheral angiopathy without gangrene: Secondary | ICD-10-CM | POA: Diagnosis not present

## 2020-02-28 DIAGNOSIS — E1140 Type 2 diabetes mellitus with diabetic neuropathy, unspecified: Secondary | ICD-10-CM | POA: Diagnosis not present

## 2020-03-03 DIAGNOSIS — E1140 Type 2 diabetes mellitus with diabetic neuropathy, unspecified: Secondary | ICD-10-CM | POA: Diagnosis not present

## 2020-03-03 DIAGNOSIS — I10 Essential (primary) hypertension: Secondary | ICD-10-CM | POA: Diagnosis not present

## 2020-03-03 DIAGNOSIS — I2510 Atherosclerotic heart disease of native coronary artery without angina pectoris: Secondary | ICD-10-CM | POA: Diagnosis not present

## 2020-03-03 DIAGNOSIS — I714 Abdominal aortic aneurysm, without rupture: Secondary | ICD-10-CM | POA: Diagnosis not present

## 2020-03-03 DIAGNOSIS — E559 Vitamin D deficiency, unspecified: Secondary | ICD-10-CM | POA: Diagnosis not present

## 2020-03-03 DIAGNOSIS — J4540 Moderate persistent asthma, uncomplicated: Secondary | ICD-10-CM | POA: Diagnosis not present

## 2020-03-03 DIAGNOSIS — Z7901 Long term (current) use of anticoagulants: Secondary | ICD-10-CM | POA: Diagnosis not present

## 2020-03-03 DIAGNOSIS — E1151 Type 2 diabetes mellitus with diabetic peripheral angiopathy without gangrene: Secondary | ICD-10-CM | POA: Diagnosis not present

## 2020-03-05 DIAGNOSIS — E559 Vitamin D deficiency, unspecified: Secondary | ICD-10-CM | POA: Diagnosis not present

## 2020-03-05 DIAGNOSIS — Z7901 Long term (current) use of anticoagulants: Secondary | ICD-10-CM | POA: Diagnosis not present

## 2020-03-05 DIAGNOSIS — I2510 Atherosclerotic heart disease of native coronary artery without angina pectoris: Secondary | ICD-10-CM | POA: Diagnosis not present

## 2020-03-05 DIAGNOSIS — E1151 Type 2 diabetes mellitus with diabetic peripheral angiopathy without gangrene: Secondary | ICD-10-CM | POA: Diagnosis not present

## 2020-03-05 DIAGNOSIS — J4540 Moderate persistent asthma, uncomplicated: Secondary | ICD-10-CM | POA: Diagnosis not present

## 2020-03-05 DIAGNOSIS — I10 Essential (primary) hypertension: Secondary | ICD-10-CM | POA: Diagnosis not present

## 2020-03-05 DIAGNOSIS — E1140 Type 2 diabetes mellitus with diabetic neuropathy, unspecified: Secondary | ICD-10-CM | POA: Diagnosis not present

## 2020-03-05 DIAGNOSIS — I714 Abdominal aortic aneurysm, without rupture: Secondary | ICD-10-CM | POA: Diagnosis not present

## 2020-03-06 ENCOUNTER — Encounter (HOSPITAL_COMMUNITY)
Admission: EM | Disposition: A | Discharge status: Skilled Nursing Facility | Payer: Self-pay | Source: Home / Self Care | Attending: Internal Medicine

## 2020-03-06 ENCOUNTER — Inpatient Hospital Stay (HOSPITAL_COMMUNITY): Payer: Medicare PPO

## 2020-03-06 ENCOUNTER — Encounter (HOSPITAL_COMMUNITY): Payer: Self-pay

## 2020-03-06 ENCOUNTER — Emergency Department (HOSPITAL_COMMUNITY): Payer: Medicare PPO | Admitting: Certified Registered Nurse Anesthetist

## 2020-03-06 ENCOUNTER — Other Ambulatory Visit: Payer: Self-pay

## 2020-03-06 ENCOUNTER — Emergency Department (HOSPITAL_COMMUNITY): Payer: Medicare PPO

## 2020-03-06 ENCOUNTER — Inpatient Hospital Stay (HOSPITAL_COMMUNITY)
Admission: EM | Admit: 2020-03-06 | Discharge: 2020-03-19 | DRG: 271 | Disposition: A | Discharge status: Skilled Nursing Facility | Payer: Medicare PPO | Attending: Internal Medicine | Admitting: Internal Medicine

## 2020-03-06 DIAGNOSIS — I96 Gangrene, not elsewhere classified: Secondary | ICD-10-CM | POA: Diagnosis not present

## 2020-03-06 DIAGNOSIS — T82868A Thrombosis of vascular prosthetic devices, implants and grafts, initial encounter: Principal | ICD-10-CM | POA: Diagnosis present

## 2020-03-06 DIAGNOSIS — I4892 Unspecified atrial flutter: Secondary | ICD-10-CM | POA: Diagnosis not present

## 2020-03-06 DIAGNOSIS — L8990 Pressure ulcer of unspecified site, unspecified stage: Secondary | ICD-10-CM | POA: Insufficient documentation

## 2020-03-06 DIAGNOSIS — IMO0002 Reserved for concepts with insufficient information to code with codable children: Secondary | ICD-10-CM

## 2020-03-06 DIAGNOSIS — D62 Acute posthemorrhagic anemia: Secondary | ICD-10-CM | POA: Diagnosis not present

## 2020-03-06 DIAGNOSIS — D696 Thrombocytopenia, unspecified: Secondary | ICD-10-CM | POA: Diagnosis not present

## 2020-03-06 DIAGNOSIS — I219 Acute myocardial infarction, unspecified: Secondary | ICD-10-CM | POA: Diagnosis not present

## 2020-03-06 DIAGNOSIS — Z7984 Long term (current) use of oral hypoglycemic drugs: Secondary | ICD-10-CM

## 2020-03-06 DIAGNOSIS — J439 Emphysema, unspecified: Secondary | ICD-10-CM | POA: Diagnosis not present

## 2020-03-06 DIAGNOSIS — Z888 Allergy status to other drugs, medicaments and biological substances status: Secondary | ICD-10-CM

## 2020-03-06 DIAGNOSIS — C7989 Secondary malignant neoplasm of other specified sites: Secondary | ICD-10-CM | POA: Diagnosis not present

## 2020-03-06 DIAGNOSIS — E876 Hypokalemia: Secondary | ICD-10-CM | POA: Diagnosis not present

## 2020-03-06 DIAGNOSIS — I236 Thrombosis of atrium, auricular appendage, and ventricle as current complications following acute myocardial infarction: Secondary | ICD-10-CM | POA: Diagnosis not present

## 2020-03-06 DIAGNOSIS — Z720 Tobacco use: Secondary | ICD-10-CM | POA: Diagnosis present

## 2020-03-06 DIAGNOSIS — Z7902 Long term (current) use of antithrombotics/antiplatelets: Secondary | ICD-10-CM

## 2020-03-06 DIAGNOSIS — L89323 Pressure ulcer of left buttock, stage 3: Secondary | ICD-10-CM | POA: Diagnosis not present

## 2020-03-06 DIAGNOSIS — Z87891 Personal history of nicotine dependence: Secondary | ICD-10-CM | POA: Diagnosis not present

## 2020-03-06 DIAGNOSIS — I82402 Acute embolism and thrombosis of unspecified deep veins of left lower extremity: Secondary | ICD-10-CM | POA: Diagnosis not present

## 2020-03-06 DIAGNOSIS — I7409 Other arterial embolism and thrombosis of abdominal aorta: Secondary | ICD-10-CM | POA: Diagnosis not present

## 2020-03-06 DIAGNOSIS — Z8249 Family history of ischemic heart disease and other diseases of the circulatory system: Secondary | ICD-10-CM | POA: Diagnosis not present

## 2020-03-06 DIAGNOSIS — L89313 Pressure ulcer of right buttock, stage 3: Secondary | ICD-10-CM | POA: Diagnosis not present

## 2020-03-06 DIAGNOSIS — Z7901 Long term (current) use of anticoagulants: Secondary | ICD-10-CM

## 2020-03-06 DIAGNOSIS — I513 Intracardiac thrombosis, not elsewhere classified: Secondary | ICD-10-CM

## 2020-03-06 DIAGNOSIS — Z20822 Contact with and (suspected) exposure to covid-19: Secondary | ICD-10-CM | POA: Diagnosis not present

## 2020-03-06 DIAGNOSIS — I25118 Atherosclerotic heart disease of native coronary artery with other forms of angina pectoris: Secondary | ICD-10-CM | POA: Diagnosis not present

## 2020-03-06 DIAGNOSIS — I700 Atherosclerosis of aorta: Secondary | ICD-10-CM

## 2020-03-06 DIAGNOSIS — R531 Weakness: Secondary | ICD-10-CM | POA: Diagnosis not present

## 2020-03-06 DIAGNOSIS — I708 Atherosclerosis of other arteries: Secondary | ICD-10-CM | POA: Diagnosis not present

## 2020-03-06 DIAGNOSIS — R5381 Other malaise: Secondary | ICD-10-CM | POA: Diagnosis not present

## 2020-03-06 DIAGNOSIS — Z9861 Coronary angioplasty status: Secondary | ICD-10-CM | POA: Diagnosis not present

## 2020-03-06 DIAGNOSIS — L89303 Pressure ulcer of unspecified buttock, stage 3: Secondary | ICD-10-CM | POA: Diagnosis not present

## 2020-03-06 DIAGNOSIS — I2510 Atherosclerotic heart disease of native coronary artery without angina pectoris: Secondary | ICD-10-CM | POA: Diagnosis not present

## 2020-03-06 DIAGNOSIS — M25551 Pain in right hip: Secondary | ICD-10-CM | POA: Diagnosis present

## 2020-03-06 DIAGNOSIS — Z48812 Encounter for surgical aftercare following surgery on the circulatory system: Secondary | ICD-10-CM | POA: Diagnosis not present

## 2020-03-06 DIAGNOSIS — I482 Chronic atrial fibrillation, unspecified: Secondary | ICD-10-CM | POA: Diagnosis not present

## 2020-03-06 DIAGNOSIS — M6281 Muscle weakness (generalized): Secondary | ICD-10-CM | POA: Diagnosis not present

## 2020-03-06 DIAGNOSIS — I10 Essential (primary) hypertension: Secondary | ICD-10-CM | POA: Diagnosis present

## 2020-03-06 DIAGNOSIS — Z79899 Other long term (current) drug therapy: Secondary | ICD-10-CM

## 2020-03-06 DIAGNOSIS — C3490 Malignant neoplasm of unspecified part of unspecified bronchus or lung: Secondary | ICD-10-CM

## 2020-03-06 DIAGNOSIS — I82401 Acute embolism and thrombosis of unspecified deep veins of right lower extremity: Secondary | ICD-10-CM | POA: Diagnosis not present

## 2020-03-06 DIAGNOSIS — C3402 Malignant neoplasm of left main bronchus: Secondary | ICD-10-CM | POA: Diagnosis present

## 2020-03-06 DIAGNOSIS — I998 Other disorder of circulatory system: Secondary | ICD-10-CM

## 2020-03-06 DIAGNOSIS — R1312 Dysphagia, oropharyngeal phase: Secondary | ICD-10-CM | POA: Diagnosis not present

## 2020-03-06 DIAGNOSIS — E1151 Type 2 diabetes mellitus with diabetic peripheral angiopathy without gangrene: Secondary | ICD-10-CM | POA: Diagnosis present

## 2020-03-06 DIAGNOSIS — I480 Paroxysmal atrial fibrillation: Secondary | ICD-10-CM

## 2020-03-06 DIAGNOSIS — Z95828 Presence of other vascular implants and grafts: Secondary | ICD-10-CM | POA: Diagnosis not present

## 2020-03-06 DIAGNOSIS — J449 Chronic obstructive pulmonary disease, unspecified: Secondary | ICD-10-CM | POA: Diagnosis present

## 2020-03-06 DIAGNOSIS — Z7401 Bed confinement status: Secondary | ICD-10-CM | POA: Diagnosis not present

## 2020-03-06 DIAGNOSIS — R918 Other nonspecific abnormal finding of lung field: Secondary | ICD-10-CM | POA: Diagnosis not present

## 2020-03-06 DIAGNOSIS — E1159 Type 2 diabetes mellitus with other circulatory complications: Secondary | ICD-10-CM | POA: Diagnosis not present

## 2020-03-06 DIAGNOSIS — T82898S Other specified complication of vascular prosthetic devices, implants and grafts, sequela: Secondary | ICD-10-CM | POA: Diagnosis not present

## 2020-03-06 DIAGNOSIS — T82898A Other specified complication of vascular prosthetic devices, implants and grafts, initial encounter: Secondary | ICD-10-CM | POA: Diagnosis not present

## 2020-03-06 DIAGNOSIS — I340 Nonrheumatic mitral (valve) insufficiency: Secondary | ICD-10-CM | POA: Diagnosis not present

## 2020-03-06 DIAGNOSIS — M79A21 Nontraumatic compartment syndrome of right lower extremity: Secondary | ICD-10-CM | POA: Diagnosis not present

## 2020-03-06 DIAGNOSIS — E1165 Type 2 diabetes mellitus with hyperglycemia: Secondary | ICD-10-CM | POA: Diagnosis present

## 2020-03-06 DIAGNOSIS — I714 Abdominal aortic aneurysm, without rupture, unspecified: Secondary | ICD-10-CM | POA: Diagnosis present

## 2020-03-06 DIAGNOSIS — Z8679 Personal history of other diseases of the circulatory system: Secondary | ICD-10-CM | POA: Diagnosis not present

## 2020-03-06 DIAGNOSIS — M2550 Pain in unspecified joint: Secondary | ICD-10-CM | POA: Diagnosis not present

## 2020-03-06 DIAGNOSIS — R41841 Cognitive communication deficit: Secondary | ICD-10-CM | POA: Diagnosis not present

## 2020-03-06 DIAGNOSIS — E785 Hyperlipidemia, unspecified: Secondary | ICD-10-CM | POA: Diagnosis present

## 2020-03-06 DIAGNOSIS — I70203 Unspecified atherosclerosis of native arteries of extremities, bilateral legs: Secondary | ICD-10-CM | POA: Diagnosis not present

## 2020-03-06 DIAGNOSIS — Z881 Allergy status to other antibiotic agents status: Secondary | ICD-10-CM

## 2020-03-06 DIAGNOSIS — Z419 Encounter for procedure for purposes other than remedying health state, unspecified: Secondary | ICD-10-CM

## 2020-03-06 DIAGNOSIS — I739 Peripheral vascular disease, unspecified: Secondary | ICD-10-CM | POA: Diagnosis not present

## 2020-03-06 DIAGNOSIS — Z01810 Encounter for preprocedural cardiovascular examination: Secondary | ICD-10-CM | POA: Diagnosis not present

## 2020-03-06 DIAGNOSIS — I071 Rheumatic tricuspid insufficiency: Secondary | ICD-10-CM | POA: Diagnosis not present

## 2020-03-06 DIAGNOSIS — I371 Nonrheumatic pulmonary valve insufficiency: Secondary | ICD-10-CM | POA: Diagnosis not present

## 2020-03-06 DIAGNOSIS — M79604 Pain in right leg: Secondary | ICD-10-CM | POA: Diagnosis not present

## 2020-03-06 DIAGNOSIS — K828 Other specified diseases of gallbladder: Secondary | ICD-10-CM | POA: Diagnosis not present

## 2020-03-06 HISTORY — PX: EMBOLECTOMY: SHX44

## 2020-03-06 HISTORY — PX: FASCIOTOMY: SHX132

## 2020-03-06 HISTORY — PX: AORTOGRAM: SHX6300

## 2020-03-06 LAB — BASIC METABOLIC PANEL
Anion gap: 11 (ref 5–15)
BUN: 13 mg/dL (ref 8–23)
CO2: 22 mmol/L (ref 22–32)
Calcium: 9.3 mg/dL (ref 8.9–10.3)
Chloride: 106 mmol/L (ref 98–111)
Creatinine, Ser: 1.08 mg/dL (ref 0.61–1.24)
GFR calc Af Amer: >60 mL/min (ref >60)
GFR calc non Af Amer: >60 mL/min (ref >60)
Glucose, Bld: 179 mg/dL — ABNORMAL HIGH (ref 70–99)
Potassium: 3.7 mmol/L (ref 3.5–5.1)
Sodium: 139 mmol/L (ref 135–145)

## 2020-03-06 LAB — GLUCOSE, CAPILLARY
Glucose-Capillary: 189 mg/dL — ABNORMAL HIGH (ref 70–99)
Glucose-Capillary: 267 mg/dL — ABNORMAL HIGH (ref 70–99)

## 2020-03-06 LAB — TROPONIN I (HIGH SENSITIVITY)
Troponin I (High Sensitivity): 12 ng/L (ref <18)
Troponin I (High Sensitivity): 27 ng/L — ABNORMAL HIGH (ref <18)

## 2020-03-06 LAB — CBC WITH DIFFERENTIAL/PLATELET
Abs Immature Granulocytes: 0.1 10*3/uL — ABNORMAL HIGH (ref 0.00–0.07)
Basophils Absolute: 0 10*3/uL (ref 0.0–0.1)
Basophils Relative: 0 %
Eosinophils Absolute: 0.1 10*3/uL (ref 0.0–0.5)
Eosinophils Relative: 1 %
HCT: 35.8 % — ABNORMAL LOW (ref 39.0–52.0)
Hemoglobin: 10.9 g/dL — ABNORMAL LOW (ref 13.0–17.0)
Immature Granulocytes: 1 %
Lymphocytes Relative: 22 %
Lymphs Abs: 2.1 10*3/uL (ref 0.7–4.0)
MCH: 27.3 pg (ref 26.0–34.0)
MCHC: 30.4 g/dL (ref 30.0–36.0)
MCV: 89.7 fL (ref 80.0–100.0)
Monocytes Absolute: 0.6 10*3/uL (ref 0.1–1.0)
Monocytes Relative: 7 %
Neutro Abs: 6.6 10*3/uL (ref 1.7–7.7)
Neutrophils Relative %: 69 %
Platelets: 51 10*3/uL — ABNORMAL LOW (ref 150–400)
RBC: 3.99 MIL/uL — ABNORMAL LOW (ref 4.22–5.81)
RDW: 13.1 % (ref 11.5–15.5)
WBC: 9.5 10*3/uL (ref 4.0–10.5)
nRBC: 0 % (ref 0.0–0.2)

## 2020-03-06 LAB — MRSA PCR SCREENING: MRSA by PCR: NEGATIVE

## 2020-03-06 LAB — LACTIC ACID, PLASMA
Lactic Acid, Venous: 3.3 mmol/L (ref 0.5–1.9)
Lactic Acid, Venous: 2.4 mmol/L (ref 0.5–1.9)

## 2020-03-06 LAB — POCT I-STAT, CHEM 8
BUN: 14 mg/dL (ref 8–23)
Calcium, Ion: 1.24 mmol/L (ref 1.15–1.40)
Chloride: 104 mmol/L (ref 98–111)
Creatinine, Ser: 0.9 mg/dL (ref 0.61–1.24)
Glucose, Bld: 179 mg/dL — ABNORMAL HIGH (ref 70–99)
HCT: 35 % — ABNORMAL LOW (ref 39.0–52.0)
Hemoglobin: 11.9 g/dL — ABNORMAL LOW (ref 13.0–17.0)
Potassium: 3.6 mmol/L (ref 3.5–5.1)
Sodium: 141 mmol/L (ref 135–145)
TCO2: 22 mmol/L (ref 22–32)

## 2020-03-06 LAB — SARS CORONAVIRUS 2 BY RT PCR (HOSPITAL ORDER, PERFORMED IN ~~LOC~~ HOSPITAL LAB): SARS Coronavirus 2: NEGATIVE

## 2020-03-06 LAB — APTT: aPTT: 159 seconds — ABNORMAL HIGH (ref 24–36)

## 2020-03-06 LAB — PROTIME-INR
INR: 1.3 — ABNORMAL HIGH (ref 0.8–1.2)
Prothrombin Time: 15.3 seconds — ABNORMAL HIGH (ref 11.4–15.2)

## 2020-03-06 SURGERY — EMBOLECTOMY
Anesthesia: General | Laterality: Right

## 2020-03-06 MED ORDER — HEPARIN SODIUM (PORCINE) 1000 UNIT/ML IJ SOLN
INTRAMUSCULAR | Status: DC | PRN
Start: 2020-03-06 — End: 2020-03-06
  Administered 2020-03-06: 3000 [IU] via INTRAVENOUS
  Administered 2020-03-06: 8000 [IU] via INTRAVENOUS
  Administered 2020-03-06: 3000 [IU] via INTRAVENOUS

## 2020-03-06 MED ORDER — HEMOSTATIC AGENTS (NO CHARGE) OPTIME
TOPICAL | Status: DC | PRN
  Administered 2020-03-06: 1 via TOPICAL

## 2020-03-06 MED ORDER — SODIUM CHLORIDE 0.9 % IV SOLN
INTRAVENOUS | Status: DC | PRN
  Administered 2020-03-06 (×2): 500 mL

## 2020-03-06 MED ORDER — DEXAMETHASONE SODIUM PHOSPHATE 10 MG/ML IJ SOLN
INTRAMUSCULAR | Status: DC | PRN
  Administered 2020-03-06: 10 mg via INTRAVENOUS

## 2020-03-06 MED ORDER — ONDANSETRON HCL 4 MG/2ML IJ SOLN
INTRAMUSCULAR | Status: DC | PRN
  Administered 2020-03-06: 4 mg via INTRAVENOUS

## 2020-03-06 MED ORDER — OXYCODONE HCL 5 MG PO TABS
5.0000 mg | ORAL_TABLET | ORAL | Status: DC | PRN
  Administered 2020-03-07 – 2020-03-08 (×3): 10 mg via ORAL
  Administered 2020-03-08 (×2): 5 mg via ORAL
  Administered 2020-03-09: 10 mg via ORAL
  Administered 2020-03-09: 5 mg via ORAL
  Administered 2020-03-09 – 2020-03-10 (×3): 10 mg via ORAL
  Administered 2020-03-12: 5 mg via ORAL
  Administered 2020-03-13 – 2020-03-18 (×9): 10 mg via ORAL
  Filled 2020-03-06 (×9): qty 2
  Filled 2020-03-06: qty 1
  Filled 2020-03-06: qty 2
  Filled 2020-03-06: qty 1
  Filled 2020-03-06 (×3): qty 2
  Filled 2020-03-06: qty 1
  Filled 2020-03-06 (×2): qty 2
  Filled 2020-03-06: qty 1
  Filled 2020-03-06 (×2): qty 2

## 2020-03-06 MED ORDER — FENTANYL CITRATE (PF) 100 MCG/2ML IJ SOLN: 25.0000 ug | INTRAMUSCULAR | Status: DC | PRN

## 2020-03-06 MED ORDER — FENTANYL CITRATE (PF) 250 MCG/5ML IJ SOLN
INTRAMUSCULAR | Status: AC
  Filled 2020-03-06: qty 5

## 2020-03-06 MED ORDER — PHENYLEPHRINE HCL (PRESSORS) 10 MG/ML IV SOLN
INTRAVENOUS | Status: DC | PRN
  Administered 2020-03-06: 80 ug via INTRAVENOUS
  Administered 2020-03-06: 120 ug via INTRAVENOUS
  Administered 2020-03-06 (×8): 80 ug via INTRAVENOUS

## 2020-03-06 MED ORDER — DOCUSATE SODIUM 100 MG PO CAPS: 100.0000 mg | ORAL_CAPSULE | Freq: Two times a day (BID) | ORAL | Status: DC | PRN

## 2020-03-06 MED ORDER — LACTATED RINGERS IV SOLN: INTRAVENOUS | Status: DC | PRN

## 2020-03-06 MED ORDER — DOCUSATE SODIUM 100 MG PO CAPS
100.0000 mg | ORAL_CAPSULE | Freq: Every day | ORAL | Status: DC
  Administered 2020-03-07 – 2020-03-18 (×11): 100 mg via ORAL
  Filled 2020-03-06 (×11): qty 1

## 2020-03-06 MED ORDER — OXYCODONE HCL 5 MG PO TABS: 5.0000 mg | ORAL_TABLET | Freq: Once | ORAL | Status: DC | PRN

## 2020-03-06 MED ORDER — CEFAZOLIN SODIUM-DEXTROSE 2-4 GM/100ML-% IV SOLN
2.0000 g | Freq: Three times a day (TID) | INTRAVENOUS | Status: AC
  Administered 2020-03-07 (×2): 2 g via INTRAVENOUS
  Filled 2020-03-06 (×2): qty 100

## 2020-03-06 MED ORDER — SODIUM CHLORIDE 0.9 % IV SOLN
INTRAVENOUS | Status: AC
  Filled 2020-03-06: qty 1.2

## 2020-03-06 MED ORDER — PROPOFOL 10 MG/ML IV BOLUS
INTRAVENOUS | Status: DC | PRN
  Administered 2020-03-06: 200 mg via INTRAVENOUS
  Administered 2020-03-06: 20 mg via INTRAVENOUS

## 2020-03-06 MED ORDER — METOPROLOL TARTRATE 5 MG/5ML IV SOLN
2.0000 mg | INTRAVENOUS | Status: DC | PRN
  Administered 2020-03-11: 5 mg via INTRAVENOUS
  Filled 2020-03-06: qty 5

## 2020-03-06 MED ORDER — PHENYLEPHRINE 40 MCG/ML (10ML) SYRINGE FOR IV PUSH (FOR BLOOD PRESSURE SUPPORT)
PREFILLED_SYRINGE | INTRAVENOUS | Status: AC
  Filled 2020-03-06: qty 20

## 2020-03-06 MED ORDER — ALUM & MAG HYDROXIDE-SIMETH 200-200-20 MG/5ML PO SUSP: 15.0000 mL | ORAL | Status: DC | PRN

## 2020-03-06 MED ORDER — POLYETHYLENE GLYCOL 3350 17 G PO PACK
17.0000 g | PACK | Freq: Every day | ORAL | Status: DC | PRN
  Administered 2020-03-18: 17 g via ORAL
  Filled 2020-03-06 (×2): qty 1

## 2020-03-06 MED ORDER — PROPOFOL 10 MG/ML IV BOLUS
INTRAVENOUS | Status: AC
  Filled 2020-03-06: qty 20

## 2020-03-06 MED ORDER — EPHEDRINE SULFATE 50 MG/ML IJ SOLN
INTRAMUSCULAR | Status: DC | PRN
  Administered 2020-03-06 (×3): 5 mg via INTRAVENOUS
  Administered 2020-03-06: 10 mg via INTRAVENOUS
  Administered 2020-03-06 (×5): 5 mg via INTRAVENOUS

## 2020-03-06 MED ORDER — ACETAMINOPHEN 325 MG RE SUPP
325.0000 mg | RECTAL | Status: DC | PRN
  Filled 2020-03-06: qty 2

## 2020-03-06 MED ORDER — ALBUMIN HUMAN 5 % IV SOLN: 25.0000 g | Freq: Once | INTRAVENOUS | Status: AC

## 2020-03-06 MED ORDER — MORPHINE SULFATE (PF) 2 MG/ML IV SOLN
2.0000 mg | INTRAVENOUS | Status: DC | PRN
  Administered 2020-03-06 – 2020-03-08 (×4): 2 mg via INTRAVENOUS
  Administered 2020-03-08 – 2020-03-09 (×2): 4 mg via INTRAVENOUS
  Administered 2020-03-09: 2 mg via INTRAVENOUS
  Administered 2020-03-09: 4 mg via INTRAVENOUS
  Administered 2020-03-10: 2 mg via INTRAVENOUS
  Administered 2020-03-11: 4 mg via INTRAVENOUS
  Administered 2020-03-11: 2 mg via INTRAVENOUS
  Administered 2020-03-12 (×2): 4 mg via INTRAVENOUS
  Administered 2020-03-17: 2 mg via INTRAVENOUS
  Filled 2020-03-06: qty 1
  Filled 2020-03-06 (×3): qty 2
  Filled 2020-03-06 (×3): qty 1
  Filled 2020-03-06 (×2): qty 2
  Filled 2020-03-06: qty 1
  Filled 2020-03-06 (×2): qty 2
  Filled 2020-03-06 (×3): qty 1

## 2020-03-06 MED ORDER — INSULIN ASPART 100 UNIT/ML ~~LOC~~ SOLN
0.0000 [IU] | Freq: Three times a day (TID) | SUBCUTANEOUS | Status: DC
  Administered 2020-03-06: 8 [IU] via SUBCUTANEOUS
  Administered 2020-03-07: 3 [IU] via SUBCUTANEOUS

## 2020-03-06 MED ORDER — GLYCOPYRROLATE 0.2 MG/ML IJ SOLN
INTRAMUSCULAR | Status: DC | PRN
Start: 2020-03-06 — End: 2020-03-06
  Administered 2020-03-06: .2 mg via INTRAVENOUS

## 2020-03-06 MED ORDER — ALBUMIN HUMAN 5 % IV SOLN
INTRAVENOUS | Status: AC
  Administered 2020-03-07: 12.5 g via INTRAVENOUS
  Filled 2020-03-06: qty 250

## 2020-03-06 MED ORDER — SODIUM CHLORIDE 0.9 % IV SOLN
0.1500 mg/kg/h | INTRAVENOUS | Status: DC
  Administered 2020-03-06: 0.15 mg/kg/h via INTRAVENOUS
  Filled 2020-03-06: qty 250

## 2020-03-06 MED ORDER — MAGNESIUM SULFATE 2 GM/50ML IV SOLN
2.0000 g | Freq: Every day | INTRAVENOUS | Status: AC | PRN
  Administered 2020-03-07: 2 g via INTRAVENOUS
  Filled 2020-03-06: qty 50

## 2020-03-06 MED ORDER — PHENOL 1.4 % MT LIQD
1.0000 | OROMUCOSAL | Status: DC | PRN
  Administered 2020-03-06 – 2020-03-07 (×3): 1 via OROMUCOSAL
  Filled 2020-03-06: qty 177

## 2020-03-06 MED ORDER — CEFAZOLIN SODIUM-DEXTROSE 2-3 GM-%(50ML) IV SOLR
INTRAVENOUS | Status: DC | PRN
  Administered 2020-03-06 (×2): 2 g via INTRAVENOUS

## 2020-03-06 MED ORDER — CEFAZOLIN SODIUM-DEXTROSE 2-4 GM/100ML-% IV SOLN
INTRAVENOUS | Status: AC
  Filled 2020-03-06: qty 100

## 2020-03-06 MED ORDER — LIDOCAINE 2% (20 MG/ML) 5 ML SYRINGE
INTRAMUSCULAR | Status: DC | PRN
  Administered 2020-03-06: 60 mg via INTRAVENOUS

## 2020-03-06 MED ORDER — SUCCINYLCHOLINE CHLORIDE 200 MG/10ML IV SOSY
PREFILLED_SYRINGE | INTRAVENOUS | Status: DC | PRN
  Administered 2020-03-06: 120 mg via INTRAVENOUS

## 2020-03-06 MED ORDER — PHENYLEPHRINE HCL-NACL 10-0.9 MG/250ML-% IV SOLN
INTRAVENOUS | Status: DC | PRN
Start: 2020-03-06 — End: 2020-03-06
  Administered 2020-03-06: 25 ug/min via INTRAVENOUS

## 2020-03-06 MED ORDER — ALBUMIN HUMAN 5 % IV SOLN
INTRAVENOUS | Status: DC | PRN
Start: 2020-03-06 — End: 2020-03-06

## 2020-03-06 MED ORDER — POTASSIUM CHLORIDE CRYS ER 20 MEQ PO TBCR: 20.0000 meq | EXTENDED_RELEASE_TABLET | Freq: Every day | ORAL | Status: DC | PRN

## 2020-03-06 MED ORDER — VASOPRESSIN 20 UNIT/ML IV SOLN
INTRAVENOUS | Status: DC | PRN
Start: 2020-03-06 — End: 2020-03-06
  Administered 2020-03-06: 2 [IU] via INTRAVENOUS

## 2020-03-06 MED ORDER — GUAIFENESIN-DM 100-10 MG/5ML PO SYRP: 15.0000 mL | ORAL_SOLUTION | ORAL | Status: DC | PRN

## 2020-03-06 MED ORDER — OXYCODONE HCL 5 MG/5ML PO SOLN: 5.0000 mg | Freq: Once | ORAL | Status: DC | PRN

## 2020-03-06 MED ORDER — 0.9 % SODIUM CHLORIDE (POUR BTL) OPTIME
TOPICAL | Status: DC | PRN
  Administered 2020-03-06: 1000 mL

## 2020-03-06 MED ORDER — VASOPRESSIN 20 UNIT/ML IV SOLN
INTRAVENOUS | Status: AC
  Filled 2020-03-06: qty 1

## 2020-03-06 MED ORDER — ROCURONIUM BROMIDE 10 MG/ML (PF) SYRINGE
PREFILLED_SYRINGE | INTRAVENOUS | Status: DC | PRN
  Administered 2020-03-06: 30 mg via INTRAVENOUS
  Administered 2020-03-06: 50 mg via INTRAVENOUS
  Administered 2020-03-06: 20 mg via INTRAVENOUS

## 2020-03-06 MED ORDER — IOHEXOL 350 MG/ML SOLN
100.0000 mL | Freq: Once | INTRAVENOUS | Status: AC | PRN
  Administered 2020-03-06: 100 mL via INTRAVENOUS

## 2020-03-06 MED ORDER — ACETAMINOPHEN 325 MG PO TABS
325.0000 mg | ORAL_TABLET | ORAL | Status: DC | PRN
  Administered 2020-03-11 – 2020-03-12 (×5): 650 mg via ORAL
  Filled 2020-03-06 (×5): qty 2

## 2020-03-06 MED ORDER — ONDANSETRON HCL 4 MG/2ML IJ SOLN: 4.0000 mg | Freq: Four times a day (QID) | INTRAMUSCULAR | Status: DC | PRN

## 2020-03-06 MED ORDER — ESMOLOL HCL 100 MG/10ML IV SOLN
INTRAVENOUS | Status: DC | PRN
  Administered 2020-03-06: 10 ug via INTRAVENOUS

## 2020-03-06 MED ORDER — ALBUMIN HUMAN 5 % IV SOLN: 25.0000 g | Freq: Once | INTRAVENOUS | Status: DC

## 2020-03-06 MED ORDER — SENNOSIDES-DOCUSATE SODIUM 8.6-50 MG PO TABS: 1.0000 | ORAL_TABLET | Freq: Every evening | ORAL | Status: DC | PRN

## 2020-03-06 MED ORDER — MIDAZOLAM HCL 2 MG/2ML IJ SOLN
INTRAMUSCULAR | Status: AC
  Filled 2020-03-06: qty 2

## 2020-03-06 MED ORDER — IODIXANOL 320 MG/ML IV SOLN
INTRAVENOUS | Status: DC | PRN
  Administered 2020-03-06: 60 mL

## 2020-03-06 MED ORDER — WHITE PETROLATUM EX OINT
TOPICAL_OINTMENT | CUTANEOUS | Status: DC | PRN
  Administered 2020-03-06 – 2020-03-07 (×2): 0.2 via TOPICAL
  Filled 2020-03-06: qty 28.35

## 2020-03-06 MED ORDER — PANTOPRAZOLE SODIUM 40 MG IV SOLR
40.0000 mg | Freq: Every day | INTRAVENOUS | Status: DC
  Administered 2020-03-07 – 2020-03-19 (×12): 40 mg via INTRAVENOUS
  Filled 2020-03-06 (×14): qty 40

## 2020-03-06 MED ORDER — FENTANYL CITRATE (PF) 250 MCG/5ML IJ SOLN
INTRAMUSCULAR | Status: DC | PRN
  Administered 2020-03-06 (×2): 50 ug via INTRAVENOUS
  Administered 2020-03-06: 100 ug via INTRAVENOUS
  Administered 2020-03-06 (×6): 50 ug via INTRAVENOUS

## 2020-03-06 MED ORDER — SODIUM CHLORIDE 0.9 % IV SOLN
500.0000 mL | Freq: Once | INTRAVENOUS | Status: AC | PRN
  Administered 2020-03-06: 500 mL via INTRAVENOUS

## 2020-03-06 MED ORDER — SUGAMMADEX SODIUM 200 MG/2ML IV SOLN
INTRAVENOUS | Status: DC | PRN
  Administered 2020-03-06: 200 mg via INTRAVENOUS

## 2020-03-06 SURGICAL SUPPLY — 84 items
BAG DECANTER FOR FLEXI CONT (MISCELLANEOUS) ×5 IMPLANT
BAG SNAP BAND KOVER 36X36 (MISCELLANEOUS) ×5 IMPLANT
BANDAGE ESMARK 6X9 LF (GAUZE/BANDAGES/DRESSINGS) IMPLANT
BNDG ELASTIC 4X5.8 VLCR STR LF (GAUZE/BANDAGES/DRESSINGS) ×10 IMPLANT
BNDG ESMARK 6X9 LF (GAUZE/BANDAGES/DRESSINGS)
BNDG GAUZE ELAST 4 BULKY (GAUZE/BANDAGES/DRESSINGS) ×10 IMPLANT
CANISTER SUCT 3000ML PPV (MISCELLANEOUS) ×5 IMPLANT
CATH BEACON 5 .035 65 KMP TIP (CATHETERS) ×5 IMPLANT
CATH EMB 3FR 80CM (CATHETERS) ×10 IMPLANT
CATH EMB 4FR 80CM (CATHETERS) ×5 IMPLANT
CATH EMB 5FR 80CM (CATHETERS) IMPLANT
CATH OMNI FLUSH 5F 65CM (CATHETERS) ×5 IMPLANT
CATH POLY DUAL LUMEN OCCL 5FR (CATHETERS) ×5 IMPLANT
CLIP VESOCCLUDE MED 24/CT (CLIP) ×5 IMPLANT
CLIP VESOCCLUDE SM WIDE 24/CT (CLIP) ×5 IMPLANT
CNTNR URN SCR LID CUP LEK RST (MISCELLANEOUS) ×3 IMPLANT
CONT SPEC 4OZ STRL OR WHT (MISCELLANEOUS) ×5
COVER WAND RF STERILE (DRAPES) IMPLANT
CUFF TOURN SGL QUICK 18X4 (TOURNIQUET CUFF) IMPLANT
CUFF TOURN SGL QUICK 24 (TOURNIQUET CUFF)
CUFF TOURN SGL QUICK 34 (TOURNIQUET CUFF)
CUFF TOURN SGL QUICK 42 (TOURNIQUET CUFF) IMPLANT
CUFF TRNQT CYL 24X4X16.5-23 (TOURNIQUET CUFF) IMPLANT
CUFF TRNQT CYL 34X4.125X (TOURNIQUET CUFF) IMPLANT
DERMABOND ADVANCED (GAUZE/BANDAGES/DRESSINGS) ×4
DERMABOND ADVANCED .7 DNX12 (GAUZE/BANDAGES/DRESSINGS) ×6 IMPLANT
DEVICE TORQUE H2O (MISCELLANEOUS) ×5 IMPLANT
DRAIN CHANNEL 15F RND FF W/TCR (WOUND CARE) IMPLANT
DRAPE INCISE IOBAN 66X45 STRL (DRAPES) IMPLANT
DRAPE X-RAY CASS 24X20 (DRAPES) IMPLANT
ELECT REM PT RETURN 9FT ADLT (ELECTROSURGICAL) ×5
ELECTRODE REM PT RTRN 9FT ADLT (ELECTROSURGICAL) ×3 IMPLANT
EVACUATOR SILICONE 100CC (DRAIN) IMPLANT
GAUZE SPONGE 4X4 12PLY STRL (GAUZE/BANDAGES/DRESSINGS) ×10 IMPLANT
GLIDEWIRE ADV .035X260CM (WIRE) ×5 IMPLANT
GLOVE BIO SURGEON STRL SZ7.5 (GLOVE) ×10 IMPLANT
GLOVE BIOGEL PI IND STRL 7.5 (GLOVE) ×3 IMPLANT
GLOVE BIOGEL PI IND STRL 8 (GLOVE) ×6 IMPLANT
GLOVE BIOGEL PI INDICATOR 7.5 (GLOVE) ×2
GLOVE BIOGEL PI INDICATOR 8 (GLOVE) ×4
GLOVE INDICATOR 7.0 STRL GRN (GLOVE) ×5 IMPLANT
GLOVE SURG SS PI 7.5 STRL IVOR (GLOVE) ×5 IMPLANT
GOWN STRL REUS W/ TWL LRG LVL3 (GOWN DISPOSABLE) ×6 IMPLANT
GOWN STRL REUS W/ TWL XL LVL3 (GOWN DISPOSABLE) ×9 IMPLANT
GOWN STRL REUS W/TWL LRG LVL3 (GOWN DISPOSABLE) ×10
GOWN STRL REUS W/TWL XL LVL3 (GOWN DISPOSABLE) ×15
HEMOSTAT ARISTA ABSORB 3G PWDR (HEMOSTASIS) ×5 IMPLANT
HEMOSTAT SNOW SURGICEL 2X4 (HEMOSTASIS) ×5 IMPLANT
HEMOSTAT SPONGE AVITENE ULTRA (HEMOSTASIS) IMPLANT
KIT BASIN OR (CUSTOM PROCEDURE TRAY) ×5 IMPLANT
KIT TURNOVER KIT B (KITS) ×5 IMPLANT
LOOP VESSEL MINI RED (MISCELLANEOUS) ×15 IMPLANT
NS IRRIG 1000ML POUR BTL (IV SOLUTION) ×10 IMPLANT
PACK GENERAL/GYN (CUSTOM PROCEDURE TRAY) ×5 IMPLANT
PACK PERIPHERAL VASCULAR (CUSTOM PROCEDURE TRAY) ×5 IMPLANT
PACK UNIVERSAL I (CUSTOM PROCEDURE TRAY) ×5 IMPLANT
PAD ABD 8X10 STRL (GAUZE/BANDAGES/DRESSINGS) ×10 IMPLANT
PAD ARMBOARD 7.5X6 YLW CONV (MISCELLANEOUS) ×10 IMPLANT
SET COLLECT BLD 21X3/4 12 (NEEDLE) IMPLANT
SET MICROPUNCTURE 5F STIFF (MISCELLANEOUS) ×5 IMPLANT
SHEATH PINNACLE 5F 10CM (SHEATH) ×15 IMPLANT
STAPLER VISISTAT 35W (STAPLE) ×5 IMPLANT
STOPCOCK 4 WAY LG BORE MALE ST (IV SETS) IMPLANT
STOPCOCK MORSE 400PSI 3WAY (MISCELLANEOUS) ×5 IMPLANT
SUT ETHILON 3 0 PS 1 (SUTURE) IMPLANT
SUT MNCRL AB 4-0 PS2 18 (SUTURE) ×10 IMPLANT
SUT PROLENE 5 0 C 1 24 (SUTURE) ×70 IMPLANT
SUT PROLENE 6 0 BV (SUTURE) ×15 IMPLANT
SUT SILK 2 0 PERMA HAND 18 BK (SUTURE) IMPLANT
SUT VIC AB 2-0 CT1 27 (SUTURE) ×15
SUT VIC AB 2-0 CT1 36 (SUTURE) ×10 IMPLANT
SUT VIC AB 2-0 CT1 TAPERPNT 27 (SUTURE) ×9 IMPLANT
SUT VIC AB 2-0 CTX 36 (SUTURE) ×5 IMPLANT
SUT VIC AB 3-0 SH 27 (SUTURE) ×10
SUT VIC AB 3-0 SH 27X BRD (SUTURE) ×6 IMPLANT
SYR 3ML LL SCALE MARK (SYRINGE) ×10 IMPLANT
SYR MEDRAD MARK V 150ML (SYRINGE) ×5 IMPLANT
TOWEL GREEN STERILE (TOWEL DISPOSABLE) ×5 IMPLANT
TRAY FOLEY MTR SLVR 16FR STAT (SET/KITS/TRAYS/PACK) ×5 IMPLANT
TUBING EXTENTION W/L.L. (IV SETS) IMPLANT
TUBING INJECTOR 48 (MISCELLANEOUS) ×5 IMPLANT
UNDERPAD 30X36 HEAVY ABSORB (UNDERPADS AND DIAPERS) ×5 IMPLANT
WATER STERILE IRR 1000ML POUR (IV SOLUTION) ×5 IMPLANT
WIRE STARTER BENTSON 035X150 (WIRE) ×5 IMPLANT

## 2020-03-06 NOTE — ED Triage Notes (Signed)
Pt BIB [facility] for eval of R sided hip pain w/ associated R leg numbness, weakness and tingling. Pt reports he was sitting on the commode this afternoon around 1230, went to stand up and noted that his R leg was numb, weak and "not working". EMS was activated shortly thereafter. ON arrival, R leg is cool to touch, unable to locate pulses w/ Doppler. No response to painful stimulus. Pt w/ recent EVAR surgery in early July. MD Trifan made aware and to bedside immediately.

## 2020-03-06 NOTE — Anesthesia Procedure Notes (Signed)
Arterial Line Insertion Start/End7/15/2021 4:21 PM, 03/06/2020 4:22 PM Performed by: Jearld Pies, CRNA, CRNA  Patient location: OR. Emergency situation Patient sedated Left was placed Catheter size: 20 G Hand hygiene performed  and Seldinger technique used Allen's test indicative of satisfactory collateral circulation Attempts: 1 Procedure performed without using ultrasound guided technique. Following insertion, dressing applied and Biopatch. Post procedure assessment: normal  Patient tolerated the procedure well with no immediate complications.

## 2020-03-06 NOTE — Progress Notes (Signed)
  Echocardiogram 2D Echocardiogram has been performed.  [name] 03/06/2020, 4:43 PM

## 2020-03-06 NOTE — Consult Note (Signed)
NAME:  [name], MRN:  [id number], DOB:  23-Aug-1946, LOS: 0 ADMISSION DATE:  03/06/2020, CONSULTATION DATE:  03/06/2020 REFERRING MD:  Dr. Carlis Abbott, CHIEF COMPLAINT:  Ischemic right lower extremity   Brief History   74yo male presented with right leg numbness and was found to have an ischemic right lower extremity and large apical thrombus.   History of present illness   [name] is a 74 year old male with a past medical history significant for CAD, diabetes, hypertension, PVD, abdominal aortic aneurysm status post endovascular stent and asthma who presented to the emergency department with complaints of right lower extremity numbness and paresthesia.  Patient reported symptoms began 12 PM on day of admission.  Reports that right leg became painful after which developed paresthesia and inability to move the leg.  Per chart review it appears patient denied any other acute complaints on admission.  On admission vital signs relatively unremarkable apart from mild hypertension.  Lab work significant for mild hyperglycemia and lactic acid 3.3.  CT angiogram obtained and revealed occlusion of the right limb of the bifurcated aortic stent, filling deficit in the left atrium consistent with thrombus, and flow deficit within the right lower extremity.  Vascular surgery was consulted and originally admitted patient.  Intraoperative TEE with large apical thrombus. Given critical status patient will be admitted to CVICU with PCCM consultation post surgery  Past Medical History  Asthma  CAD Type 2 diabetes  Abdominal aortic aneurysm status post endovascular stent PVD Hypertension Asthma  Significant Hospital Events   Admitted 7/15  Consults:  Vascular surgery PCCM  Procedures:  7/15 multiple thrombectomies during vascular surgery with 4 compartment fasciotomies by Dr. Carlis Abbott  Significant Diagnostic Tests:  CT angio AO + bifem 7/15 ? VASCULAR  1. Right limb of the bifurcated aortic stent  graft is occluded. This is a new finding and consistent with patient's right leg symptoms. 2. Concern for filling defects in the left atrium. This could represent left atrial thrombus and source for embolic disease.  3. Right runoff has slightly changed since 01/14/2020. There is now single-vessel runoff from the peroneal artery. Previously, there was flow at the ankle from the posterior tibial artery. The lack of flow in the distal posterior tibial artery could be related to slow flow from the occluded stent limb but cannot exclude embolic disease involving the right runoff vessels. 4. No significant change in the right outflow disease. Right SFA and right popliteal artery remain patent. There continues to be focal narrowing in the right popliteal artery possibly related to a dissection. 5. Left iliac artery stents remain patent. No significant change in the left outflow or runoff vessels.  NON-VASCULAR  1. No acute abnormality in the abdomen or pelvis.   Micro Data:  Covid 7/15 > negative  Antimicrobials:  None  Interim history/subjective:  Seen lying in bed after return from OR. Denies any acute complaints   Objective   Blood pressure 113/72, pulse 67, temperature 98.4 F (36.9 C), temperature source Oral, resp. rate 16, height 6\' 3"  (1.905 m), weight 79 kg, SpO2 100 %.        Intake/Output Summary (Last 24 hours) at 03/06/2020 1931 Last data filed at 03/06/2020 1929 Gross per 24 hour  Intake 2400 ml  Output 975 ml  Net 1425 ml   Filed Weights   03/06/20 1410 03/06/20 1520  Weight: 78.5 kg 79 kg    Examination: General: Elderly gentleman lying in bed in no acute distress  HEENT: [place]/AT,  MM pink/moist, PERRL, sclera non-icteric  Neuro: sleepy from anesthesia but alert and oriented CV: s1s2 regular rate and rhythm, no murmur, rubs, or gallops,  PULM:  Clear to ascultation bilaterally, no added breath sounds, no increased work of breathing  GI: soft, bowel  sounds active in all 4 quadrants, non-tender, non-distended Extremities: warm/dry, no edema, right LE dressed in ace wrap C/D/I Skin: no rashes or lesions  Resolved Hospital Problem list     Assessment & Plan:  Acute bilateral occlusions of aortobiliac graft Left apical thrombus -Seen on intraoperative TEE P: Anticoagulation per TCS and vascular surgery, currently on Angiomax due to concern for HIT  Wound care Frequent neurovascular checks Close monitoring in the ICU setting  History of hypertension History of CAD and PVD -Medication reconciliation not yet completed but initial review reveals home medications include Eliquis, Plavix, Cardura, Imdur, lisinopril, and Crestor P:  Monitor in the ICU setting  Supportive care PRN antihypertensives  Resume home medications once appropriate   Thrombocytopenia -Plt count 51 on admission, Plt count was 186 on 01/21/2020 P: HIT panel ordered Close monitoring for signs of bleeding  Repeat H&H in 4 hrs   Diabetes -Home medications include Metformin P: Hold home Metformin in the acute setting SSI during admission CBG checks every 4  Best practice:  Diet: N.p.o. Pain/Anxiety/Delirium protocol (if indicated): In place VAP protocol (if indicated): In place DVT prophylaxis: Per vascular surgery GI prophylaxis: PPI Glucose control: SSI Mobility: Strict bedrest Code Status: Full code Family Communication: Per primary Disposition: ICU  Labs   CBC: Recent Labs  Lab 03/06/20 1417 03/06/20 1432  WBC 9.5  --   NEUTROABS 6.6  --   HGB 10.9* 11.9*  HCT 35.8* 35.0*  MCV 89.7  --   PLT 51*  --     Basic Metabolic Panel: Recent Labs  Lab 03/06/20 1417 03/06/20 1432  NA 139 141  K 3.7 3.6  CL 106 104  CO2 22  --   GLUCOSE 179* 179*  BUN 13 14  CREATININE 1.08 0.90  CALCIUM 9.3  --    GFR: Estimated Creatinine Clearance: 81.7 mL/min (by C-G formula based on SCr of 0.9 mg/dL). Recent Labs  Lab 03/06/20 1417  03/06/20 1426  WBC 9.5  --   LATICACIDVEN  --  3.3*    Liver Function Tests: No results for input(s): AST, ALT, ALKPHOS, BILITOT, PROT, ALBUMIN in the last 168 hours. No results for input(s): LIPASE, AMYLASE in the last 168 hours. No results for input(s): AMMONIA in the last 168 hours.  ABG    Component Value Date/Time   HCO3 27.3 12/17/2019 1413   TCO2 22 03/06/2020 1432   ACIDBASEDEF 1.0 07/14/2007 1633   O2SAT 100.0 12/17/2019 1413     Coagulation Profile: Recent Labs  Lab 03/06/20 1417  INR 1.3*    Cardiac Enzymes: No results for input(s): CKTOTAL, CKMB, CKMBINDEX, TROPONINI in the last 168 hours.  HbA1C: Hgb A1c MFr Bld  Date/Time Value Ref Range Status  01/15/2020 06:01 AM 8.2 (H) 4.8 - 5.6 % Final    Comment:    (NOTE) Pre diabetes:          5.7%-6.4% Diabetes:              >6.4% Glycemic control for   <7.0% adults with diabetes     CBG: No results for input(s): GLUCAP in the last 168 hours.  Review of Systems:   Unable to assess as patient is sedated and intubated  Past Medical History  He,  has a past medical history of Asthma, Coronary artery disease, Diabetes mellitus without complication ([place]), History of kidney stones, Hypertension, and Peripheral vascular disease ([place]).   Surgical History    Past Surgical History:  Procedure Laterality Date   CARDIAC CATHETERIZATION N/A 07/05/2016   Procedure: Left Heart Cath and Coronary Angiography;  Surgeon: Sherren Mocha, MD;  Location: [facility];  Service: Cardiovascular;  Laterality: N/A;   CORONARY ANGIOPLASTY     ENDOVASCULAR STENT INSERTION N/A 01/15/2020   Procedure: ENDOVASCULAR ANEURYSM REPAIR;  Surgeon: Waynetta Sandy, MD;  Location: [facility];  Service: Vascular;  Laterality: N/A;   heart stents  2006   INSERTION OF ILIAC STENT Left 01/15/2020   Procedure: INSERTION OF LEFT EXTERNAL ILIAC ARTERY STENT;  Surgeon: Waynetta Sandy, MD;  Location: [facility];  Service:  Vascular;  Laterality: Left;   LOWER EXTREMITY ANGIOGRAM  01/15/2020   Procedure: Left Lower Extremity Angiogram;  Surgeon: Waynetta Sandy, MD;  Location: [facility];  Service: Vascular;;   THROMBECTOMY ILIAC ARTERY  01/15/2020   Procedure: THROMBECTOMY OF LEFT ILIAC ARTERY;  Surgeon: Waynetta Sandy, MD;  Location: [facility];  Service: Vascular;;     Social History   reports that he has quit smoking. He has never used smokeless tobacco. He reports that he does not drink alcohol and does not use drugs.   Family History   His family history includes CAD in his brother and mother.   Allergies Allergies  Allergen Reactions   Erythromycin Shortness Of Breath and Swelling   Simvastatin Other (See Comments)    CAUSE MUSCLE PAIN,.   Statins Other (See Comments)    Muscle pain     Home Medications  Prior to Admission medications   Medication Sig Start Date End Date Taking? Authorizing Provider  apixaban (ELIQUIS) 5 MG TABS tablet Take 1 tablet (5 mg total) by mouth 2 (two) times daily. 01/22/20  Yes Ulyses Amor, PA-C  acetaminophen (TYLENOL) 500 MG tablet Take 1,000 mg by mouth 2 (two) times daily as needed for mild pain or headache ([place]).    [provider]  albuterol (PROVENTIL HFA;VENTOLIN HFA) 108 (90 BASE) MCG/ACT inhaler Inhale into the lungs every 6 (six) hours as needed for wheezing or shortness of breath. Inhale 2 puffs every 6 hours as needed for breathing    [provider]  buPROPion (ZYBAN) 150 MG 12 hr tablet Take 150 mg by mouth daily.     [provider]  cholecalciferol (VITAMIN D) 1000 units tablet Take 1,000 Units by mouth daily.     [provider]  clopidogrel (PLAVIX) 75 MG tablet Take 75 mg by mouth daily.    [provider]  diclofenac sodium (VOLTAREN) 1 % GEL Apply 4 g topically 3 (three) times daily as needed (pain).     [provider]  doxazosin (CARDURA) 1 MG tablet Take 1 mg by mouth  daily.    [provider]  finasteride (PROSCAR) 5 MG tablet Take 5 mg by mouth daily.    [provider]  gabapentin (NEURONTIN) 300 MG capsule Take 600 mg by mouth 2 (two) times daily.     [provider]  hydroxypropyl methylcellulose / hypromellose (ISOPTO TEARS / GONIOVISC) 2.5 % ophthalmic solution Place 1-2 drops into both eyes 3 (three) times a week.     [provider]  isosorbide mononitrate (IMDUR) 30 MG 24 hr tablet Take 30 mg  by mouth daily.    [provider]  lisinopril (PRINIVIL,ZESTRIL) 10 MG tablet Take 10 mg by mouth daily.  09/27/18   [provider]  loratadine (CLARITIN) 10 MG tablet Take 10 mg by mouth daily.    [provider]  Lysine 1000 MG TABS Take 1,000 mg by mouth 2 (two) times daily.    [provider]  meclizine (ANTIVERT) 25 MG tablet Take 25 mg by mouth 3 (three) times daily as needed for dizziness or nausea.     [provider]  metFORMIN (GLUCOPHAGE-XR) 500 MG 24 hr tablet Take 1,000 mg by mouth in the morning and at bedtime.  10/17/18   [provider]  nitroGLYCERIN (NITROSTAT) 0.4 MG SL tablet Place 0.4 mg under the tongue every 5 (five) minutes as needed for chest pain.    [provider]  Olodaterol HCl 2.5 MCG/ACT AERS Inhale 2 puffs into the lungs daily.    [provider]  oxyCODONE-acetaminophen (PERCOCET/ROXICET) 5-325 MG tablet Take 1 tablet by mouth every 6 (six) hours as needed for moderate pain. 01/22/20   Ulyses Amor, PA-C  petrolatum-hydrophilic-aloe vera (ALOE VESTA) ointment Apply 1 application topically 3 (three) times daily.    [provider]  rosuvastatin (CRESTOR) 20 MG tablet Take 20 mg by mouth daily.    [provider]  sildenafil (VIAGRA) 100 MG tablet Take 100 mg by mouth daily as needed for erectile dysfunction.    [provider]  tamsulosin (FLOMAX) 0.4 MG CAPS capsule Take 0.4 mg by mouth at bedtime.      [provider]     Critical care time:   Performed by: Johnsie Cancel  Total critical care time: 40 minutes  Critical care time was exclusive of separately billable procedures and treating other patients.  Critical care was necessary to treat or prevent imminent or life-threatening deterioration.  Critical care was time spent personally by me on the following activities: development of treatment plan with patient and/or surrogate as well as nursing, discussions with consultants, evaluation of patient's response to treatment, examination of patient, obtaining history from patient or surrogate, ordering and performing treatments and interventions, ordering and review of laboratory studies, ordering and review of radiographic studies, pulse oximetry and re-evaluation of patient's condition.  Johnsie Cancel, NP-C [facility] Contact / Pager information can be found on Amion  03/06/2020, 9:01 PM

## 2020-03-06 NOTE — Progress Notes (Signed)
eLink Physician-Brief Progress Note Patient Name: [name] DOB: Dec 23, 1945 MRN: [id number]   Date of Service  03/06/2020  HPI/Events of Note  Drop in blood pressure likely due to third spacing but bleeding needs to be ruled out as well.  eICU Interventions  Albumin 5 % 500 ml iv bolus x 1, stat H and H to r/o bleeding.        Kerry Kass Cannon Arreola 03/06/2020, 11:53 PM

## 2020-03-06 NOTE — Op Note (Addendum)
Date: March 06, 2020  Preoperative diagnosis:  1. Acutely occluded right limb of aortobiiliac endograft for treatment of abdominal aortic aneurysm  Postoperative diagnosis: 1.  Acutely occluded right limb of aortobiiliac endograft for treatment of abdominal aortic aneurysm 2.  Acutely occluded left limb of aortobiiliac endograft for treatment of abdominal aortic aneurysm 3.  Left atrial thrombus  Procedure: 1.  Thrombectomy of right limb of aorto biiliac endograft using over-the-wire Fogarty 2.  Right lower extremity thrombectomy from femoral approach 3.  Aortogram with bilateral iliac arteriogram 4.  Redo exposure of left common femoral artery 5.  Thrombectomy of left limb of aortobiiliac endograft  6.  Left lower extremity thrombectomy from femoral approach 7.  Right lower extremity 4 compartment fasciotomies  Surgeon: Dr. 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 Donzetta Matters on 01/15/2020.  He presented to the ED today with acute right limb ischemia and had no motor or sensation in the right leg.  He was found to have an occluded right limb of his aortobiiliac endograft.  Also question possibility of left atrial thrombus on CT that was obtained in the ED.  He presents emergently to the operating room after risk benefits discussed including high risk for limb loss and high risk for mortality.  An assistant was needed for exposure and to expedite the case.  Findings: Initial Intra-Op TEE after patient was intubated showed large left atrial thrombus that appeared fairly mobile.  Subsequently cutdown on the right common femoral artery and after  opening the artery I could not get a Fogarty to pass proximally from a femoral approach into the right limb of the aortobiiliac endograft.  Ultimately had to use a Glidewire advantage to cross the right limb of the endograft and use over-the-wire Fogarty after multiple passes was finally able to retrieve large plugs of chronic appearing thrombus with pulsatile inflow.  Subsequently shot a retrograde right iliac arteriogram and there was a widely patent right limb but I did not see any filling of the left limb that was previously patent on CT when he presented to the ED today.  I then put a catheter up into the proximal body of the endograft and again only the right limb was patent.  Subsequently had to reexpose the left common femoral artery that was very scarred in given recent surgery about a month ago.  Ultimately opened the left common femoral arteryand had no pulsatile inflow.  Was able to pass a #4 Fogarty proximally easily and retrieved large plugs of chronic appearing thrombus and in the process dislodged the left external iliac stent that was full of thrombus and occluded.  Subsequently got pulsatile inflow in the left groin.  Retrograde left iliac arteriogram showed no evidence of dissection with a widely patent left limb.  I was able to pass a Fogarty catheter 70 cm from bilateral common femoral approach into SFA and popliteal arteries and did not retrieve any thrombus from the infrainguinal segment.  Subsequently both common femoral arteriotomies were closed with interrupted 5-0 Prolene's given these were both performed transverse incisions.  Right lower extremity 4 compartment fasciotomy was performed and left open and the muscle appeared viable.  He has a  left dorsalis pedis posterior tibial signal as well as the right peroneal signal at completion.  Anesthesia: General  Details: Patient was taken to the operating room after informed consent was obtained.  Placed on operative table in supine  position.  General endotracheal anesthesia was induced.  Anesthesia initially performed TEE with pertinent findings per their documentation but appeared to have a large amount of left atrial thrombus.  I did consult CT surgery Intra-Op for further evaluation at their discretion.  The patient's abdomen both groins and the right leg were then prepped and draped in usual sterile fashion.  He got preoperative antibiotics.  Initially made a horizontal groin incision above the inguinal crease in the right groin.  Dissected down with Bovie cautery opened the subcutaneous tissue and femoral sheath and got circumferential control of the SFA and profunda as well as distal external iliac controlled all all these with Vesseloops.  Patient was given 100 units per kilogram heparin.  Opened the right common femoral artery transversely with 11 blade scalpel extended with Potts scissors.  There was no inflow once the artery was opened.  Initially tried to pass a #3 and then a #4 Fogarty into the right iliac proximally but only could get this to pass about 10 cm before I met resistance.  At that point time elected to attempt over-the-wire Fogarty and fluoroscopic C-arm was brought in and I placed a 5 French sheath in the arteriotomy in the right groin and then under fluoroscopy was able to pass a Glidewire advantage through the right limb of the aortobiiliac stent and into the main body the graft and suprarenal aorta.  Over this I used over the wire Fogarty's and made at least 3-4 passes and got some small plugs of chronic appearing thrombus and still did not have pulsatile inflow.  I did shoot a retrograde iliac arteriogram there appeared to be multiple chunks of thrombus in the distal limb of the graft on the right.  I then made two additional passes with an over the wire Fogarty and got a large plug of chronic appearing thrombus with excellent pulsatile inflow.  That point in time when I shot another retrograde shot to ensure that  we had completely thrombectomized the limb I noticed that the left limb of the endograft had no flow.  I then put a pigtail catheter up and shot a formal aortogram and again this showed no flow down the left side of the endograft and this has been patent on preoperative imaging.  We checked the left groin and there was no femoral pulse.  I then went over to the left-sided reopened his transverse incision in the left groin this was a very difficult dissection given recent femoral exposure about 30 days ago.  Significantly scarred in.  Ultimately just got control of the common femoral that we can get Vesseloops proximal and distal and then opened the left common femoral artery transversely with 11 blade scalpel extended with Pott scissors and had no inflow in the left groin.  I was able to pass a #4 Fogarty proximally relatively easily and then ultimately retrieved large plugs of chronic appearing thrombus with some more acute appearing thrombus.  In the process we did dislodge the left external iliac stent that was full of clot and this was removed with the fogarty balloon.  We then had excellent pulsatile inflow.  I then placed a short 5 French sheath in the left groin shot a retrograde iliac arteriogram and had a widely  patent limb on the left.  I did not see any dissection and the left external iliac looked widely patent after the stent was dislodged and I elected to leave this.  That point time I then passed #3 Fogarty's down both SFAs and they easily passed 70 cm for both the right and left lower extremity thrombectomies.  I made one pass down each artery given I did not retrieve any thrombus and had backbleeding in the artery.  Each artery was then closed initially in the left groin and then the right groin with 5-0 Prolene in interrupted fashion given we had performed transverse arteriotomies.  Multiple additional patch sutures had to be placed.  That point time I did discuss case with Dr. Prescott Gum and we  stopped giving the patient heparin given his labs came back and there was a concern for HIT so we transitioned to Angiomax.  We then checked both feet and had a dorsalis pedis and posterior tibial signal on the left and a peroneal signal in the right.  Then turned our attention to the right leg and initially a longitudinal incision was made on the lateral calf 2 fingerbreadths laterally and opened the subcutaneous tissue with Bovie cautery and the fascia was then opened transversely to identify the anterior lateral compartments and then Metzenbaum scissors were used to bluntly open both anterior lateral compartments.  I then went made a medial longitudinal incision 2 fingerbreadths medial to the tibia and again opened the fascia.  In addition we went ahead and opened the deep posterior compartment by taking down the soleus off the tibia.  All the muscle appeared viable.    I used hemoblast to get hemostasis in both groins and then placed additional patch sutures in the transverse arteriotomy given anticoagulation was not reversed.  Both groins then closed with multiple layers of 2-0 Vicryl, 3-0 Vicryl, 4-0 Monocryl in the skin and Dermabond.  Fasciotomies in the right leg were left open and packed with multiple moist Kerlix wrapped with ABDs and additional Kerlix and Ace for hemostasis.  The end of the case we checked that we had a left dorsalis pedis posterior tibial and right peroneal signal.  Complications: None  Condition: Critical  Marty Heck, MD [facility] Office: [place]

## 2020-03-06 NOTE — Progress Notes (Signed)
[place] for bivalirudin Indication: LA thrombus, occluded aortic stent graft   Allergies  Allergen Reactions  . Erythromycin Shortness Of Breath and Swelling  . Simvastatin Other (See Comments)    CAUSE MUSCLE PAIN,.  . Statins Other (See Comments)    Muscle pain    Patient Measurements: Height: 6\' 3"  (190.5 cm) Weight: 79 kg (174 lb 2.6 oz) IBW/kg (Calculated) : 84.5   HEPARIN DW (KG): 79  Vital Signs: Temp: 98.4 F (36.9 C) (07/15 1410) Temp Source: Oral (07/15 1410) BP: 113/72 (07/15 1410) Pulse Rate: 67 (07/15 1410)  Labs: Recent Labs    03/06/20 1417  HGB 10.9*  HCT 35.8*  PLT 51*  LABPROT 15.3*  INR 1.3*  CREATININE 1.08  TROPONINIHS 12    Estimated Creatinine Clearance: 68.1 mL/min (by C-G formula based on SCr of 1.08 mg/dL).   Medical History: Past Medical History:  Diagnosis Date  . Asthma   . Coronary artery disease   . Diabetes mellitus without complication ([place])   . History of kidney stones   . Hypertension   . Peripheral vascular disease [facility])     Assessment: 74 y.o. 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 Patient was taken to OR for thrombectomy and intraop TEE revealed LA thrombus. Pharmacy initially consulted for IV heparin dosing, but as patient has suspected HIT, pharmacy has been consulted for bivalirudin dosing.   Patient takes apixaban at home for atrial fibrillation, last PTA dose 7/15 morning. Platelets 51 today, down from previous 186 two months ago. Baseline INR 1.3  Goal of Therapy:  aPTT 50-85 secs Monitor platelets by anticoagulation protocol: Yes  Plan:  Start bivalirudin infusion at 0.15mg /kg/hr Monitor aPTT every 2 hours until therapeutic x 2 levels Continue to monitor for signs/symptoms of bleeding   Brendolyn Patty, PharmD Clinical Pharmacist  03/06/2020    7:17 PM   Please check AMION for all [place] phone numbers After 10:00 PM, call the [place] [phone]

## 2020-03-06 NOTE — Progress Notes (Signed)
eLink Physician-Brief Progress Note Patient Name: [name] DOB: Jul 29, 1946 MRN: [id number]   Date of Service  03/06/2020  HPI/Events of Note  Patient is post-op thrombectomy of  occluded bilateral aorto-illiac grafts. He also had right lower extremity fasciotomies.  eICU Interventions  New Patient Evaluation completed.        [name] [name] 03/06/2020, 10:21 PM

## 2020-03-06 NOTE — Transfer of Care (Signed)
Immediate Anesthesia Transfer of Care Note  Patient: [name]  Procedure(s) Performed: THROMBECTOMY OF BILATERAL ILIAC ARTERIES (Bilateral ) ILIAC INTRA OPERATIVE ARTERIOGRAM (Right ) FASCIOTOMY (Right )  Patient Location: PACU  Anesthesia Type:General  Level of Consciousness: awake, alert  and oriented  Airway & Oxygen Therapy: Patient Spontanous Breathing and Patient connected to face mask oxygen  Post-op Assessment: Report given to RN and Post -op Vital signs reviewed and stable  Post vital signs: Reviewed and stable  Last Vitals:  Vitals Value Taken Time  BP 139/61 (ABP) 03/06/20 1951  Temp    Pulse 77 03/06/20 1957  Resp 18 03/06/20 1957  SpO2 100 % 03/06/20 1957  Vitals shown include unvalidated device data.  Last Pain:  Vitals:   03/06/20 1520  TempSrc:   PainSc: 8          Complications: No complications documented.

## 2020-03-06 NOTE — Anesthesia Preprocedure Evaluation (Signed)
Anesthesia Evaluation  Patient identified by MRN, date of birth, ID band Patient awake    Reviewed: Allergy & Precautions, H&P , NPO status , Patient's Chart, lab work & pertinent test results  Airway Mallampati: II   Neck ROM: full    Dental   Pulmonary asthma , former smoker,    breath sounds clear to auscultation       Cardiovascular hypertension, + CAD and + Peripheral Vascular Disease   Rhythm:regular Rate:Normal     Neuro/Psych    GI/Hepatic   Endo/Other  diabetes, Type 2  Renal/GU stones     Musculoskeletal   Abdominal   Peds  Hematology   Anesthesia Other Findings   Reproductive/Obstetrics                             Anesthesia Physical Anesthesia Plan  ASA: III and emergent  Anesthesia Plan: General   Post-op Pain Management:    Induction: Intravenous  PONV Risk Score and Plan: 2 and Ondansetron, Dexamethasone and Treatment may vary due to age or medical condition  Airway Management Planned: Oral ETT  Additional Equipment: TEE  Intra-op Plan:   Post-operative Plan: Extubation in OR  Informed Consent: I have reviewed the patients History and Physical, chart, labs and discussed the procedure including the risks, benefits and alternatives for the proposed anesthesia with the patient or authorized representative who has indicated his/her understanding and acceptance.       Plan Discussed with: CRNA, Anesthesiologist and Surgeon  Anesthesia Plan Comments:         Anesthesia Quick Evaluation

## 2020-03-06 NOTE — Progress Notes (Addendum)
Day of Surgery Procedure(s) (LRB): THROMBECTOMY (Right) ILIAC INTRA OPERATIVE ARTERIOGRAM (Right) FASCIOTOMY (Right) Subjective: Asked to review [name] intraoperative TEE while undergoing thrombectomy of occluded aorto-bifemoral graft. I reviewed the TEE images in the OR with Dr Marcie Bal showing LA thrombus arising from a L side pulmonary vein extending to the mitral valve which was not present 2 months ago on echo.Patient has hx a-fib postop aortobifem in May 2021 , no mitral valve disease.He has been on plavix  and Eliquis postop PMX- DM, HTN, smoking  Objective: Vital signs in last 24 hours: Temp:  [98.4 F (36.9 C)] 98.4 F (36.9 C) (07/15 1410) Pulse Rate:  [67] 67 (07/15 1410) Resp:  [16] 16 (07/15 1410) BP: (113)/(72) 113/72 (07/15 1410) SpO2:  [100 %] 100 % (07/15 1410) Weight:  [78.5 kg-79 kg] 79 kg (07/15 1520)  Hemodynamic parameters for last 24 hours:    Intake/Output from previous day: No intake/output data recorded. Intake/Output this shift: Total I/O In: 300 [IV Piggyback:300] Out: 675 [Urine:600; Blood:75]  IN OR prepped and draped  Lab Results: Recent Labs    03/06/20 1417  WBC 9.5  HGB 10.9*  HCT 35.8*  PLT 51*   BMET:  Recent Labs    03/06/20 1417  NA 139  K 3.7  CL 106  CO2 22  GLUCOSE 179*  BUN 13  CREATININE 1.08  CALCIUM 9.3    PT/INR:  Recent Labs    03/06/20 1417  LABPROT 15.3*  INR 1.3*   ABG    Component Value Date/Time   HCO3 27.3 12/17/2019 1413   TCO2 29 01/14/2020 2255   ACIDBASEDEF 1.0 07/14/2007 1633   O2SAT 100.0 12/17/2019 1413   CBG (last 3)  No results for input(s): GLUCAP in the last 72 hours.  Assessment/Plan: S/P Procedure(s) (LRB): THROMBECTOMY (Right) ILIAC INTRA OPERATIVE ARTERIOGRAM (Right) FASCIOTOMY (Right)  new LA thrombus postop aortobifem with severe drop in plts [id number]  Would check HIT and use bivalirudin instead of heparin for postop anticoagulation. Check hypercoagulability  panel. With a significant possibility he has HIT would not recommend cardiotomy/CPB at this time as using bivalirudin for CPB is very high risk for massive bleeding since there is no antidote.  Patient d/w Dr Carlis Abbott for coordination of care  [name] 03/06/2020

## 2020-03-06 NOTE — ED Provider Notes (Signed)
[facility] EMERGENCY DEPARTMENT Provider Note   CSN: [id number] Arrival date & time: 03/06/20  1404     History Chief Complaint  Patient presents with  . Hip Pain    [name] is a 74 y.o. male with a history of AAA repair (5 cm infrarenal) approximately 1 month ago by Dr. Donzetta Matters with left iliac thrombectomy present emergency department with acute onset right lower leg numbness and weakness.  Patient reports he was getting up off the toilet around 1230 this afternoon and felt like his entire right leg got weak.  He feels good sensation is significantly diminished in entire right leg.  He describes it is chronic pain in his lower back with feels like it is persistently worsened since onset of the symptoms.  He denies lightheadedness currently.  However, per EMS, he was transiently hypotensive in route to the hospital with BP 80/60 and was given normal saline bolus, with improvement of his blood pressure.    HPI     Past Medical History:  Diagnosis Date  . Asthma   . Coronary artery disease   . Diabetes mellitus without complication ([place])   . History of kidney stones   . Hypertension   . Peripheral vascular disease [facility])     Patient Active Problem List   Diagnosis Date Noted  . Femoral-tibial bypass graft occlusion, right ([place]) 03/06/2020  . Aortic occlusion ([place]) 03/06/2020  . AAA (abdominal aortic aneurysm) without rupture ([place]) 01/15/2020  . Tobacco abuse 06/02/2017  . CAD (coronary artery disease) 07/12/2016  . Bradycardia 07/06/2016  . Chest pain with high risk for cardiac etiology 07/03/2016  . Hypokalemia 07/03/2016  . DM (diabetes mellitus) ([place]) 07/03/2016  . Essential hypertension 07/03/2016  . Gross hematuria 07/03/2016    Past Surgical History:  Procedure Laterality Date  . CARDIAC CATHETERIZATION N/A 07/05/2016   Procedure: Left Heart Cath and Coronary Angiography;  Surgeon: Sherren Mocha, MD;  Location: [facility];  Service:  Cardiovascular;  Laterality: N/A;  . CORONARY ANGIOPLASTY    . ENDOVASCULAR STENT INSERTION N/A 01/15/2020   Procedure: ENDOVASCULAR ANEURYSM REPAIR;  Surgeon: Waynetta Sandy, MD;  Location: [facility];  Service: Vascular;  Laterality: N/A;  . heart stents  2006  . INSERTION OF ILIAC STENT Left 01/15/2020   Procedure: INSERTION OF LEFT EXTERNAL ILIAC ARTERY STENT;  Surgeon: Waynetta Sandy, MD;  Location: [facility];  Service: Vascular;  Laterality: Left;  . LOWER EXTREMITY ANGIOGRAM  01/15/2020   Procedure: Left Lower Extremity Angiogram;  Surgeon: Waynetta Sandy, MD;  Location: [facility];  Service: Vascular;;  . THROMBECTOMY ILIAC ARTERY  01/15/2020   Procedure: THROMBECTOMY OF LEFT ILIAC ARTERY;  Surgeon: Waynetta Sandy, MD;  Location: [facility];  Service: Vascular;;       Family History  Problem Relation Age of Onset  . CAD Mother   . CAD Brother     Social History   Tobacco Use  . Smoking status: Former Research scientist (life sciences)  . Smokeless tobacco: Never Used  Vaping Use  . Vaping Use: Never used  Substance Use Topics  . Alcohol use: No    Alcohol/week: 0.0 standard drinks  . Drug use: No    Home Medications Prior to Admission medications   Medication Sig Start Date End Date Taking? Authorizing Provider  acetaminophen (TYLENOL) 500 MG tablet Take 1,000 mg by mouth 2 (two) times daily as needed for mild pain or headache ([place]).    [provider]  albuterol (PROVENTIL HFA;VENTOLIN HFA) 108 (90 BASE) MCG/ACT inhaler Inhale into the lungs every 6 (six) hours as needed for wheezing or shortness of breath. Inhale 2 puffs every 6 hours as needed for breathing    [provider]  apixaban (ELIQUIS) 5 MG TABS tablet Take 1 tablet (5 mg total) by mouth 2 (two) times daily. 01/22/20   Ulyses Amor, PA-C  buPROPion (ZYBAN) 150 MG 12 hr tablet Take 150 mg by mouth daily.     [provider]  cholecalciferol (VITAMIN D) 1000 units tablet Take 1,000  Units by mouth daily.     [provider]  clopidogrel (PLAVIX) 75 MG tablet Take 75 mg by mouth daily.    [provider]  diclofenac sodium (VOLTAREN) 1 % GEL Apply 4 g topically 3 (three) times daily as needed (pain).     [provider]  doxazosin (CARDURA) 1 MG tablet Take 1 mg by mouth daily.    [provider]  finasteride (PROSCAR) 5 MG tablet Take 5 mg by mouth daily.    [provider]  gabapentin (NEURONTIN) 300 MG capsule Take 600 mg by mouth 2 (two) times daily.     [provider]  hydroxypropyl methylcellulose / hypromellose (ISOPTO TEARS / GONIOVISC) 2.5 % ophthalmic solution Place 1-2 drops into both eyes 3 (three) times a week.     [provider]  isosorbide mononitrate (IMDUR) 30 MG 24 hr tablet Take 30 mg by mouth daily.    [provider]  lisinopril (PRINIVIL,ZESTRIL) 10 MG tablet Take 10 mg by mouth daily.  09/27/18   [provider]  loratadine (CLARITIN) 10 MG tablet Take 10 mg by mouth daily.    [provider]  Lysine 1000 MG TABS Take 1,000 mg by mouth 2 (two) times daily.    [provider]  meclizine (ANTIVERT) 25 MG tablet Take 25 mg by mouth 3 (three) times daily as needed for dizziness or nausea.     [provider]  metFORMIN (GLUCOPHAGE-XR) 500 MG 24 hr tablet Take 1,000 mg by mouth in the morning and at bedtime.  10/17/18   [provider]  nitroGLYCERIN (NITROSTAT) 0.4 MG SL tablet Place 0.4 mg under the tongue every 5 (five) minutes as needed for chest pain.    [provider]  Olodaterol HCl 2.5 MCG/ACT AERS Inhale 2 puffs into the lungs daily.    [provider]  oxyCODONE-acetaminophen (PERCOCET/ROXICET) 5-325 MG tablet Take 1 tablet by mouth every 6 (six) hours as needed for moderate pain. 01/22/20   Ulyses Amor, PA-C  petrolatum-hydrophilic-aloe vera (ALOE VESTA) ointment Apply 1 application topically 3 (three) times daily.     [provider]  rosuvastatin (CRESTOR) 20 MG tablet Take 20 mg by mouth daily.    [provider]  sildenafil (VIAGRA) 100 MG tablet Take 100 mg by mouth daily as needed for erectile dysfunction.    [provider]  tamsulosin (FLOMAX) 0.4 MG CAPS capsule Take 0.4 mg by mouth at bedtime.     [provider]    Allergies    Erythromycin, Simvastatin, and Statins  Review of Systems   Review of Systems  Constitutional: Negative for chills and fever.  HENT: Negative for ear pain and sore throat.   Eyes: Negative for photophobia and visual disturbance.  Respiratory: Negative for cough and shortness of breath.   Cardiovascular: Negative for chest pain and palpitations.  Gastrointestinal: Positive for abdominal pain. Negative  for vomiting.  Genitourinary: Negative for dysuria and hematuria.  Musculoskeletal: Positive for arthralgias and back pain.  Skin: Negative for rash and wound.  Neurological: Positive for dizziness, weakness, light-headedness and numbness.  Psychiatric/Behavioral: Negative for agitation and confusion.  All other systems reviewed and are negative.   Physical Exam Updated Vital Signs BP 113/72 (BP Location: Right Arm)   Pulse 67   Temp 98.4 F (36.9 C) (Oral)   Resp 16   Ht 6\' 3"  (1.905 m)   Wt 79 kg   SpO2 100%   BMI 21.77 kg/m   Physical Exam Vitals and nursing note reviewed.  Constitutional:      Appearance: He is well-developed.  HENT:     Head: Normocephalic and atraumatic.  Eyes:     Conjunctiva/sclera: Conjunctivae normal.  Cardiovascular:     Rate and Rhythm: Normal rate and regular rhythm.     Comments: Absent right femoral, popliteal and pedal pulse with doppler or palpation Left lower extremities palpable femoral and audible pedal with doppler Pulmonary:     Effort: Pulmonary effort is normal. No respiratory distress.     Breath sounds: Normal breath sounds.  Abdominal:     Palpations: Abdomen is soft.      Tenderness: There is no abdominal tenderness.     Comments: No pulsatile abdominal mass palpable  Musculoskeletal:     Cervical back: Neck supple.  Skin:    General: Skin is warm and dry.  Neurological:     Mental Status: He is alert.     Comments: 1/5 strenght in right lower extremity 5/5 strength in left lower extremity Paresthesias in entire right lower extremity from hip to toes Sensation preserved in LLE  Psychiatric:        Mood and Affect: Mood normal.        Behavior: Behavior normal.     ED Results / Procedures / Treatments   Labs (all labs ordered are listed, but only abnormal results are displayed) Labs Reviewed  BASIC METABOLIC PANEL - Abnormal; Notable for the following components:      Result Value   Glucose, Bld 179 (*)    All other components within normal limits  PROTIME-INR - Abnormal; Notable for the following components:   Prothrombin Time 15.3 (*)    INR 1.3 (*)    All other components within normal limits  LACTIC ACID, PLASMA - Abnormal; Notable for the following components:   Lactic Acid, Venous 3.3 (*)    All other components within normal limits  CBC WITH DIFFERENTIAL/PLATELET - Abnormal; Notable for the following components:   RBC 3.99 (*)    Hemoglobin 10.9 (*)    HCT 35.8 (*)    All other components within normal limits  SARS CORONAVIRUS 2 BY RT PCR (HOSPITAL ORDER, [facility])  CBC WITH DIFFERENTIAL/PLATELET  LACTIC ACID, PLASMA  BASIC METABOLIC PANEL  CBC  MAGNESIUM  PHOSPHORUS  I-STAT CHEM 8, ED  TYPE AND SCREEN  TROPONIN I (HIGH SENSITIVITY)  TROPONIN I (HIGH SENSITIVITY)    EKG None  Radiology CT ANGIO AO+BIFEM W & OR WO CONTRAST  Result Date: 03/06/2020 CLINICAL DATA:  74 year old with sudden onset of right leg weakness. Abdominal aortic aneurysm repair 1 month ago. EXAM: CT ANGIOGRAPHY OF ABDOMINAL AORTA WITH ILIOFEMORAL RUNOFF TECHNIQUE: Multidetector CT imaging of the abdomen, pelvis and  lower extremities was performed using the standard protocol during bolus administration of intravenous contrast. Multiplanar CT image reconstructions and MIPs were obtained  to evaluate the vascular anatomy. CONTRAST:  126mL OMNIPAQUE IOHEXOL 350 MG/ML SOLN COMPARISON:  CTA 01/16/2020 and 01/14/2020 FINDINGS: VASCULAR Heart: There is concern for filling defects in the left atrium that could represent thrombus. This area is incompletely evaluated. This area of concern is seen on sequence 5, image 1. Aorta: Normal appearance of the distal descending thoracic aorta. Again noted is an aortic stent graft in the infrarenal aorta. Main body of the stent graft is patent but the right limb of the stent graft is occluded. Left limb of the graft remains patent. The abdominal aortic aneurysm sac measures 4.8 x 4.4 cm and previously measured 5.1 x 4.5 cm. No obvious endoleak on this arterial phase of imaging. No stranding or fluid around the aneurysm sac. Celiac: Celiac trunk and main branch vessels are patent. SMA: Mild atherosclerotic disease at the origin without significant stenosis. Main branches of the SMA are patent. Renals: Single bilateral renal arteries are patent. IMA: Origin of the IMA is occluded due to the aneurysm sac. Evidence for reconstitution beyond the origin. RIGHT Lower Extremity Inflow: Right limb of the aortic stent graft is occluded. This represents a new finding. Chronic stenosis at the origin of the right internal iliac artery. Right external iliac artery is patent. Outflow: Flow postsurgical changes in the right groin. The right common femoral artery has atherosclerotic disease but patent. Right profunda femoral artery is patent. Right SFA is patent with areas of at least mild narrowing. Again noted is a short segment narrowing in the distal right SFA measuring close to 50% stenosis. These findings are unchanged. Right popliteal artery is patent. 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 Degree of stenosis appears to be greater than 50% at the popliteal artery dissection. Runoff: Primary runoff is the peroneal artery which is similar to the previous examination. Small amount of flow in the proximal anterior tibial artery and there may be decreased flow along the anterior tibial artery collateral distribution. This may be related to a small amount of thrombus or slow flow. Posterior tibial occludes in the mid calf and this is similar to the previous examination. Previously, there was distal reconstitution of the posterior tibial artery which is not clearly identified on this examination. LEFT Lower Extremity Inflow: Left aortic stent graft limb is patent. Left common and external iliac artery stents remain patent. Outflow: Postsurgical changes around the left common femoral artery. Left common femoral artery is patent. Left profunda femoral arteries are patent. Left SFA is patent with mild areas of narrowing. Stable appearance of the left SFA. Left popliteal artery is patent Runoff: Again noted is narrowing at the origin of the left anterior tibial artery. Left anterior tibial artery occludes in the proximal calf and there may be some areas of segmental reconstitution. Tibioperoneal trunk is patent. Peroneal artery is patent and there may be improved flow in the peroneal artery compared to the previous examination. Previously, there was a segment of the peroneal artery that was occluded. Posterior tibial artery occludes in the proximal calf. Evidence for segmental reconstitution of the posterior tibial artery. Veins: No obvious venous abnormality within the limitations of this arterial phase study. Review of the MIP images confirms the above findings. NON-VASCULAR Lower chest: Lung bases are clear without pleural effusions. Hepatobiliary: Gallbladder is decompressed. Main portal veins are patent. Mild  dilatation of the proximal extrahepatic bile duct measuring up  to 1.3 cm and unchanged. Distal common bile duct measures roughly 0.5 cm and similar to the previous examination. Pancreas: Unremarkable. No pancreatic ductal dilatation or surrounding inflammatory changes. Spleen: Normal in size without focal abnormality. Adrenals/Urinary Tract: Normal appearance of the adrenal glands. Negative for hydronephrosis. Probable small renal cysts. Urinary bladder is unremarkable. Stomach/Bowel: Stomach is within normal limits. Appendix appears normal. No evidence of bowel wall thickening, distention, or inflammatory changes. Lymphatic: No significant lymph node enlargement in the abdomen or pelvis. Reproductive: Prostate is mildly prominent measuring 4.8 cm in transverse dimension. Other: Negative for ascites.  Negative for free air. Musculoskeletal: No acute bone abnormality. IMPRESSION: VASCULAR 1. Right limb of the bifurcated aortic stent graft is occluded. This is a new finding and consistent with patient's right leg symptoms. 2. Concern for filling defects in the left atrium. This could represent left atrial thrombus and source for embolic disease. 3. Right runoff has slightly changed since 01/14/2020. There is now single-vessel runoff from the peroneal artery. Previously, there was flow at the ankle from the posterior tibial artery. The lack of flow in the distal posterior tibial artery could be related to slow flow from the occluded stent limb but cannot exclude embolic disease involving the right runoff vessels. 4. No significant change in the right outflow disease. Right SFA and right popliteal artery remain patent. There continues to be focal narrowing in the right popliteal artery possibly related to a dissection. 5. Left iliac artery stents remain patent. No significant change in the left outflow or runoff vessels. NON-VASCULAR 1. No acute abnormality in the abdomen or pelvis. These results were called by  telephone at the time of interpretation on 03/06/2020 at 3:30 pm to provider Monica Martinez, MD, who verbally acknowledged these results. Electronically Signed   By: Markus Daft M.D.   On: 03/06/2020 16:13    Procedures .Critical Care Performed by: Wyvonnia Dusky, MD Authorized by: Wyvonnia Dusky, MD   Critical care provider statement:    Critical care time (minutes):  35   Critical care was necessary to treat or prevent imminent or life-threatening deterioration of the following conditions:  Circulatory failure   Critical care was time spent personally by me on the following activities:  Discussions with consultants, evaluation of patient's response to treatment, examination of patient, ordering and performing treatments and interventions, ordering and review of laboratory studies, ordering and review of radiographic studies, pulse oximetry, re-evaluation of patient's condition, obtaining history from patient or surrogate and review of old charts Comments:     Critical occlusion of aortic stent to right lower extremity requiring emergent specialist consultation, repeat neuro and vascular checks at bedside   (including critical care time)  Medications Ordered in ED Medications  docusate sodium (COLACE) capsule 100 mg ( Oral MAR Hold 03/06/20 1606)  polyethylene glycol (MIRALAX / GLYCOLAX) packet 17 g ( Oral MAR Hold 03/06/20 1606)  heparin 6,000 Units in sodium chloride 0.9 % 500 mL irrigation (500 mLs Irrigation Given 03/06/20 1609)  0.9 % irrigation (POUR BTL) (1,000 mLs Irrigation Given 03/06/20 1610)  pantoprazole (PROTONIX) injection 40 mg (has no administration in time range)  iodixanol (VISIPAQUE) 320 MG/ML injection (150 mLs Other Given 03/06/20 1647)  iohexol (OMNIPAQUE) 350 MG/ML injection 100 mL (100 mLs Intravenous Contrast Given 03/06/20 1456)    ED Course  I have reviewed the triage vital signs and the nursing notes.  Pertinent labs & imaging results that were available  during my care of the patient were  reviewed by me and considered in my medical decision making (see chart for details).  74 yo male presenting to ED with acute onset right lower extremity weakness, numbness, and found to have no femoral or pedal pulse in the ED.  Neurologically weak on right side.  This raised immediate concern for vascular injury or obstruction.  Proximal IV's were placed by nursing upon arrival and patient was taken for stat CT angio with runoff, demonstrating occlusion of the right limb of the aortic stent placed, and questionable thrombus or filling defect of the left atrium (incompletely imaged).  Dr Carlis Abbott from [facility] was emergently consulted upon patient's arrival in the ED and planned for OR management with medical ICU admission afterwards.  In the ED the patient's mental status was normal and his BP was stable.  He was given IV fluids but did not require vasopressors.  His labs were notable for lactate 3.3 on arrival.  Prior medical records were reviewed by myself noting recent AAA repair.    Clinical Course as of Mar 07 1735  Thu Mar 06, 2020  1427 Spoke to dr Carlis Abbott [facility] who recommends labs, CTA abdomen with runoff to legs and will come see patient.  CT contacted for expedited scan, not to wait on labs   [MT]  1437 On his way to CT now   [MT]  1500 Occlusion of right lower extremity, plan for OR with Dr Carlis Abbott   [MT]  1539 Per Dr Carlis Abbott, patient to be admitted to ICU for management post-operatively.   Dr Vanita Panda the EDP taking over has contacted crit care and they will assess patient.  Plan for admission to hospital regardless.  Pt remains stable at the time of signout.   [MT]    Clinical Course User Index [MT] Vannary Greening, Carola Rhine, MD    Final Clinical Impression(s) / ED Diagnoses Final diagnoses:  Vascular occlusion    Rx / DC Orders ED Discharge Orders    None       Langston Masker Carola Rhine, MD 03/06/20 1736

## 2020-03-06 NOTE — H&P (Addendum)
Hospital Consult    Reason for Consult: Ischemic right lower extremity Referring Physician: ED MRN #:  [id number]  History of Present Illness: This is a 74 y.o. male with history of hypertension, diabetes, coronary artery disease, abdominal aortic aneurysm status post endovascular stent graft that presents with ischemic right lower extremity.  Patient states this started acutely today at 12 PM when he was sitting on the toilet.  His right leg became painful he had no sensation or movement in the right leg.  At the time of my evaluation in the CT scanner he cannot move his right leg and is numb up to the right groin crease.  Patient previously had open exposure of the left common femoral artery with a left common and external iliac thrombectomy and endovascular aneurysm repair of abdominal aortic aneurysm and stent of the left external iliac by Dr. Donzetta Matters on 01/15/2020.  He was recently seen in follow-up and was doing well.  Past Medical History:  Diagnosis Date  . Asthma   . Coronary artery disease   . Diabetes mellitus without complication ([place])   . History of kidney stones   . Hypertension   . Peripheral vascular disease [facility])     Past Surgical History:  Procedure Laterality Date  . CARDIAC CATHETERIZATION N/A 07/05/2016   Procedure: Left Heart Cath and Coronary Angiography;  Surgeon: Sherren Mocha, MD;  Location: [place] [facility];  Service: Cardiovascular;  Laterality: N/A;  . CORONARY ANGIOPLASTY    . ENDOVASCULAR STENT INSERTION N/A 01/15/2020   Procedure: ENDOVASCULAR ANEURYSM REPAIR;  Surgeon: Waynetta Sandy, MD;  Location: [place];  Service: Vascular;  Laterality: N/A;  . heart stents  2006  . INSERTION OF ILIAC STENT Left 01/15/2020   Procedure: INSERTION OF LEFT EXTERNAL ILIAC ARTERY STENT;  Surgeon: Waynetta Sandy, MD;  Location: [place];  Service: Vascular;  Laterality: Left;  . LOWER EXTREMITY ANGIOGRAM  01/15/2020   Procedure: Left Lower Extremity  Angiogram;  Surgeon: Waynetta Sandy, MD;  Location: [place];  Service: Vascular;;  . THROMBECTOMY ILIAC ARTERY  01/15/2020   Procedure: THROMBECTOMY OF LEFT ILIAC ARTERY;  Surgeon: Waynetta Sandy, MD;  Location: [place];  Service: Vascular;;    Allergies  Allergen Reactions  . Erythromycin Shortness Of Breath and Swelling  . Simvastatin Other (See Comments)    CAUSE MUSCLE PAIN,.  . Statins Other (See Comments)    Muscle pain    Prior to Admission medications   Medication Sig Start Date End Date Taking? Authorizing Provider  acetaminophen (TYLENOL) 500 MG tablet Take 1,000 mg by mouth 2 (two) times daily as needed for mild pain or headache ([place]).    [provider]  albuterol (PROVENTIL HFA;VENTOLIN HFA) 108 (90 BASE) MCG/ACT inhaler Inhale into the lungs every 6 (six) hours as needed for wheezing or shortness of breath. Inhale 2 puffs every 6 hours as needed for breathing    [provider]  apixaban (ELIQUIS) 5 MG TABS tablet Take 1 tablet (5 mg total) by mouth 2 (two) times daily. 01/22/20   Ulyses Amor, PA-C  buPROPion (ZYBAN) 150 MG 12 hr tablet Take 150 mg by mouth daily.     [provider]  cholecalciferol (VITAMIN D) 1000 units tablet Take 1,000 Units by mouth daily.     [provider]  clopidogrel (PLAVIX) 75 MG tablet Take 75 mg by mouth daily.    [provider]  diclofenac sodium (VOLTAREN) 1 % GEL Apply 4  g topically 3 (three) times daily as needed (pain).     [provider]  doxazosin (CARDURA) 1 MG tablet Take 1 mg by mouth daily.    [provider]  finasteride (PROSCAR) 5 MG tablet Take 5 mg by mouth daily.    [provider]  gabapentin (NEURONTIN) 300 MG capsule Take 600 mg by mouth 2 (two) times daily.     [provider]  hydroxypropyl methylcellulose / hypromellose (ISOPTO TEARS / GONIOVISC) 2.5 % ophthalmic solution Place 1-2 drops into both eyes 3 (three) times a  week.     [provider]  isosorbide mononitrate (IMDUR) 30 MG 24 hr tablet Take 30 mg by mouth daily.    [provider]  lisinopril (PRINIVIL,ZESTRIL) 10 MG tablet Take 10 mg by mouth daily.  09/27/18   [provider]  loratadine (CLARITIN) 10 MG tablet Take 10 mg by mouth daily.    [provider]  Lysine 1000 MG TABS Take 1,000 mg by mouth 2 (two) times daily.    [provider]  meclizine (ANTIVERT) 25 MG tablet Take 25 mg by mouth 3 (three) times daily as needed for dizziness or nausea.     [provider]  metFORMIN (GLUCOPHAGE-XR) 500 MG 24 hr tablet Take 1,000 mg by mouth in the morning and at bedtime.  10/17/18   [provider]  nitroGLYCERIN (NITROSTAT) 0.4 MG SL tablet Place 0.4 mg under the tongue every 5 (five) minutes as needed for chest pain.    [provider]  Olodaterol HCl 2.5 MCG/ACT AERS Inhale 2 puffs into the lungs daily.    [provider]  oxyCODONE-acetaminophen (PERCOCET/ROXICET) 5-325 MG tablet Take 1 tablet by mouth every 6 (six) hours as needed for moderate pain. 01/22/20   Ulyses Amor, PA-C  petrolatum-hydrophilic-aloe vera (ALOE VESTA) ointment Apply 1 application topically 3 (three) times daily.    [provider]  rosuvastatin (CRESTOR) 20 MG tablet Take 20 mg by mouth daily.    [provider]  sildenafil (VIAGRA) 100 MG tablet Take 100 mg by mouth daily as needed for erectile dysfunction.    [provider]  tamsulosin (FLOMAX) 0.4 MG CAPS capsule Take 0.4 mg by mouth at bedtime.     [provider]    Social History   Socioeconomic History  . Marital status: Married    Spouse name: Not on file  . Number of children: Not on file  . Years of education: Not on file  . Highest education level: Not on file  Occupational History  . Not on file  Tobacco Use  . Smoking status: Former Research scientist (life sciences)  . Smokeless tobacco: Never Used  Vaping Use  .  Vaping Use: Never used  Substance and Sexual Activity  . Alcohol use: No    Alcohol/week: 0.0 standard drinks  . Drug use: No  . Sexual activity: Not on file  Other Topics Concern  . Not on file  Social History Narrative  . Not on file   Social Determinants of Health   Financial Resource Strain:   . Difficulty of Paying Living Expenses:   Food Insecurity:   . Worried About Charity fundraiser in the Last Year:   . Arboriculturist in the Last Year:   Transportation Needs:   . Film/video editor (Medical):   Marland Kitchen Lack of Transportation (Non-Medical):   Physical Activity:   . Days of Exercise per Week:   .  Minutes of Exercise per Session:   Stress:   . Feeling of Stress :   Social Connections:   . Frequency of Communication with Friends and Family:   . Frequency of Social Gatherings with Friends and Family:   . Attends Religious Services:   . Active Member of Clubs or Organizations:   . Attends Archivist Meetings:   Marland Kitchen Marital Status:   Intimate Partner Violence:   . Fear of Current or Ex-Partner:   . Emotionally Abused:   Marland Kitchen Physically Abused:   . Sexually Abused:      Family History  Problem Relation Age of Onset  . CAD Mother   . CAD Brother     ROS: [x]  Positive   [ ]  Negative   [ ]  All sytems reviewed and are negative  Cardiovascular: []  chest pain/pressure []  palpitations []  SOB lying flat []  DOE []  pain in legs while walking []  pain in legs at rest []  pain in legs at night []  non-healing ulcers []  hx of DVT []  swelling in legs  Pulmonary: []  productive cough []  asthma/wheezing []  home O2  Neurologic: [x]  weakness in []  arms [x]  legs (right) [x]  numbness in []  arms [x]  legs (right) []  hx of CVA []  mini stroke [] difficulty speaking or slurred speech []  temporary loss of vision in one eye []  dizziness  Hematologic: []  hx of cancer []  bleeding problems []  problems with blood clotting easily  Endocrine:   []  diabetes []  thyroid  disease  GI []  vomiting blood []  blood in stool  GU: []  CKD/renal failure []  HD--[]  M/W/F or []  T/T/S []  burning with urination []  blood in urine  Psychiatric: []  anxiety []  depression  Musculoskeletal: []  arthritis []  joint pain  Integumentary: []  rashes []  ulcers  Constitutional: []  fever []  chills   Physical Examination  Vitals:   03/06/20 1410  BP: 113/72  Pulse: 67  Resp: 16  Temp: 98.4 F (36.9 C)  SpO2: 100%   Body mass index is 21.62 kg/m.  General:  Mild extremis Gait: Not observed HENT: WNL, normocephalic Pulmonary: normal non-labored breathing, without Rales, rhonchi,  wheezing Cardiac: regular, without  Murmurs, rubs or gallops Abdomen: soft, NT/ND, no masses Vascular Exam/Pulses: Palpable left femoral pulse no palpable right femoral pulse Right foot is cool No motor or sensation in the right leg below the inguinal crease Extremities: Profoundly ischemic right leg Musculoskeletal: no muscle wasting or atrophy  Neurologic: No motor or sensation in the right leg below the inguinal crease   CBC    Component Value Date/Time   WBC 5.8 01/21/2020 0806   RBC 3.92 (L) 01/21/2020 0806   HGB 11.1 (L) 01/21/2020 0806   HCT 35.3 (L) 01/21/2020 0806   PLT 186 01/21/2020 0806   MCV 90.1 01/21/2020 0806   MCH 28.3 01/21/2020 0806   MCHC 31.4 01/21/2020 0806   RDW 12.9 01/21/2020 0806   LYMPHSABS 1.5 01/16/2020 1652   MONOABS 0.8 01/16/2020 1652   EOSABS 0.1 01/16/2020 1652   BASOSABS 0.0 01/16/2020 1652    BMET    Component Value Date/Time   NA 139 01/19/2020 1537   K 4.1 01/19/2020 1537   CL 107 01/19/2020 1537   CO2 24 01/19/2020 1537   GLUCOSE 174 (H) 01/19/2020 1537   BUN 9 01/19/2020 1537   CREATININE 1.07 01/19/2020 1537   CALCIUM 8.6 (L) 01/19/2020 1537   GFRNONAA >60 01/19/2020 1537   GFRAA >60 01/19/2020 1537    COAGS: Lab Results  Component Value Date   INR 1.3 (H) 03/06/2020   INR 1.1 01/15/2020   INR 1.0 01/14/2020      Non-Invasive Vascular Imaging:    CTA abdomen pelvis lower extremity runoff shows occlusion of the right limb of his endovascular stent graft.  He does reconstitute an external iliac and has flow down the right common femoral SFA popliteal artery with what looks like a peroneal runoff.  Possibly some clot in right popliteal artery as well.   ASSESSMENT/PLAN: This is a 74 y.o. male that presents with profoundly ischemic right leg with complete loss of motor and sensation in the right leg and occluded right limb of his endovascular stent graft for treatment of his AAA.  I recommend proceeding emergently to the operating room for thrombectomy of the right limb of his endovascular stent graft as well as iliac arteriogram possible iliac stent and right leg fasciotomies and possible right leg thrombectomy.  Unclear to me exactly why the right limb of his endograft occluded other than some disease in the proximal external iliac that does not appear flow-limiting on his most recent CT.  We will plan to start him on heparin.  Discussed this is a limb and life-threatening situation.   Marty Heck, MD [facility] [place] Office: [phone]

## 2020-03-06 NOTE — Anesthesia Postprocedure Evaluation (Signed)
Anesthesia Post Note  Patient: [name]  Procedure(s) Performed: THROMBECTOMY OF RIGHT LIMB OF BIFURCATED AORTO-BI-ILIAC STENT GRAFT; RIGHT LOWER EXTREMITY THROMBECTOMY USING THE FEMORAL APPROACH; REDO EXPOSURE LEFT COMMON FEMORAL ARTERY; THROMBECTOMY LEFT LIMB OF AORTO-BI-ILIAC STENT GRAFT;  LEFT LOWER EXTREMITY THROMBECTOMY USING THE FEMORAL APPROACH (Bilateral ) RIGHT LOWER EXTREMITY FOUR COMPARTMENT FASCIOTOMIES (Right ) AORTOGRAM WITH BILATERAL ILIAC ARTERIOGRAM     Patient location during evaluation: PACU Anesthesia Type: General Level of consciousness: awake and alert Pain management: pain level controlled Vital Signs Assessment: post-procedure vital signs reviewed and stable Respiratory status: spontaneous breathing, nonlabored ventilation, respiratory function stable and patient connected to nasal cannula oxygen Cardiovascular status: blood pressure returned to baseline and stable Postop Assessment: no apparent nausea or vomiting Anesthetic complications: no   No complications documented.  Last Vitals:  Vitals:   03/06/20 2115 03/06/20 2130  BP: 105/65 107/74  Pulse: 85 91  Resp: 17 20  Temp:    SpO2: 98% 100%    Last Pain:  Vitals:   03/06/20 2021  TempSrc:   PainSc: 0-No pain                 Effie Berkshire

## 2020-03-06 NOTE — Anesthesia Procedure Notes (Signed)
Procedure Name: Intubation Date/Time: 03/06/2020 4:12 PM Performed by: Jearld Pies, CRNA Pre-anesthesia Checklist: Patient identified, Emergency Drugs available, Suction available and Patient being monitored Patient Re-evaluated:Patient Re-evaluated prior to induction Oxygen Delivery Method: Circle System Utilized Preoxygenation: Pre-oxygenation with 100% oxygen Induction Type: IV induction, Rapid sequence and Cricoid Pressure applied Laryngoscope Size: Miller and 3 Grade View: Grade I Tube type: Oral Tube size: 7.5 mm Number of attempts: 1 Airway Equipment and Method: Stylet Placement Confirmation: ETT inserted through vocal cords under direct vision,  positive ETCO2 and breath sounds checked- equal and bilateral Secured at: 22 cm Tube secured with: Tape Dental Injury: Teeth and Oropharynx as per pre-operative assessment

## 2020-03-07 ENCOUNTER — Encounter (HOSPITAL_COMMUNITY): Payer: Self-pay | Admitting: Vascular Surgery

## 2020-03-07 ENCOUNTER — Other Ambulatory Visit: Payer: Self-pay

## 2020-03-07 DIAGNOSIS — I236 Thrombosis of atrium, auricular appendage, and ventricle as current complications following acute myocardial infarction: Secondary | ICD-10-CM | POA: Diagnosis not present

## 2020-03-07 LAB — APTT
aPTT: 45 seconds — ABNORMAL HIGH (ref 24–36)
aPTT: 49 seconds — ABNORMAL HIGH (ref 24–36)
aPTT: 52 seconds — ABNORMAL HIGH (ref 24–36)

## 2020-03-07 LAB — BASIC METABOLIC PANEL
Anion gap: 11 (ref 5–15)
Anion gap: 9 (ref 5–15)
BUN: 13 mg/dL (ref 8–23)
BUN: 13 mg/dL (ref 8–23)
CO2: 20 mmol/L — ABNORMAL LOW (ref 22–32)
CO2: 21 mmol/L — ABNORMAL LOW (ref 22–32)
Calcium: 8.4 mg/dL — ABNORMAL LOW (ref 8.9–10.3)
Calcium: 8.6 mg/dL — ABNORMAL LOW (ref 8.9–10.3)
Chloride: 107 mmol/L (ref 98–111)
Chloride: 107 mmol/L (ref 98–111)
Creatinine, Ser: 1.06 mg/dL (ref 0.61–1.24)
Creatinine, Ser: 1 mg/dL (ref 0.61–1.24)
GFR calc Af Amer: >60 mL/min (ref >60)
GFR calc Af Amer: >60 mL/min (ref >60)
GFR calc non Af Amer: >60 mL/min (ref >60)
GFR calc non Af Amer: >60 mL/min (ref >60)
Glucose, Bld: 207 mg/dL — ABNORMAL HIGH (ref 70–99)
Glucose, Bld: 133 mg/dL — ABNORMAL HIGH (ref 70–99)
Potassium: 4.2 mmol/L (ref 3.5–5.1)
Potassium: 3.5 mmol/L (ref 3.5–5.1)
Sodium: 138 mmol/L (ref 135–145)
Sodium: 137 mmol/L (ref 135–145)

## 2020-03-07 LAB — POCT I-STAT 7, (LYTES, BLD GAS, ICA,H+H)
Acid-base deficit: 1 mmol/L (ref 0.0–2.0)
Bicarbonate: 23.2 mmol/L (ref 20.0–28.0)
Calcium, Ion: 1.24 mmol/L (ref 1.15–1.40)
HCT: 29 % — ABNORMAL LOW (ref 39.0–52.0)
Hemoglobin: 9.9 g/dL — ABNORMAL LOW (ref 13.0–17.0)
O2 Saturation: 99 %
Patient temperature: 35.5
Potassium: 4.2 mmol/L (ref 3.5–5.1)
Sodium: 139 mmol/L (ref 135–145)
TCO2: 24 mmol/L (ref 22–32)
pCO2 arterial: 35.4 mmHg (ref 32.0–48.0)
pH, Arterial: 7.418 (ref 7.350–7.450)
pO2, Arterial: 136 mmHg — ABNORMAL HIGH (ref 83.0–108.0)

## 2020-03-07 LAB — PREPARE RBC (CROSSMATCH)

## 2020-03-07 LAB — CBC
HCT: 21 % — ABNORMAL LOW (ref 39.0–52.0)
HCT: 24.5 % — ABNORMAL LOW (ref 39.0–52.0)
HCT: 25.4 % — ABNORMAL LOW (ref 39.0–52.0)
Hemoglobin: 6.7 g/dL — CL (ref 13.0–17.0)
Hemoglobin: 7.9 g/dL — ABNORMAL LOW (ref 13.0–17.0)
Hemoglobin: 8.1 g/dL — ABNORMAL LOW (ref 13.0–17.0)
MCH: 27.2 pg (ref 26.0–34.0)
MCH: 27.6 pg (ref 26.0–34.0)
MCH: 28.3 pg (ref 26.0–34.0)
MCHC: 31.9 g/dL (ref 30.0–36.0)
MCHC: 31.9 g/dL (ref 30.0–36.0)
MCHC: 32.2 g/dL (ref 30.0–36.0)
MCV: 85.4 fL (ref 80.0–100.0)
MCV: 86.4 fL (ref 80.0–100.0)
MCV: 87.8 fL (ref 80.0–100.0)
Platelets: 37 10*3/uL — ABNORMAL LOW (ref 150–400)
Platelets: 39 10*3/uL — ABNORMAL LOW (ref 150–400)
Platelets: 41 10*3/uL — ABNORMAL LOW (ref 150–400)
RBC: 2.46 MIL/uL — ABNORMAL LOW (ref 4.22–5.81)
RBC: 2.79 MIL/uL — ABNORMAL LOW (ref 4.22–5.81)
RBC: 2.94 MIL/uL — ABNORMAL LOW (ref 4.22–5.81)
RDW: 13.2 % (ref 11.5–15.5)
RDW: 14.3 % (ref 11.5–15.5)
RDW: 14.5 % (ref 11.5–15.5)
WBC: 10.3 10*3/uL (ref 4.0–10.5)
WBC: 7.8 10*3/uL (ref 4.0–10.5)
WBC: 9.6 10*3/uL (ref 4.0–10.5)
nRBC: 0 % (ref 0.0–0.2)
nRBC: 0.3 % — ABNORMAL HIGH (ref 0.0–0.2)
nRBC: 0.3 % — ABNORMAL HIGH (ref 0.0–0.2)

## 2020-03-07 LAB — POCT ACTIVATED CLOTTING TIME
Activated Clotting Time: 263 seconds
Activated Clotting Time: 230 seconds
Activated Clotting Time: 268 seconds

## 2020-03-07 LAB — LIPID PANEL
Cholesterol: 124 mg/dL (ref 0–200)
HDL: 23 mg/dL — ABNORMAL LOW (ref >40)
LDL Cholesterol: 87 mg/dL (ref 0–99)
Total CHOL/HDL Ratio: 5.4 RATIO
Triglycerides: 72 mg/dL (ref <150)
VLDL: 14 mg/dL (ref 0–40)

## 2020-03-07 LAB — ECHO INTRAOPERATIVE TEE
Height: 75 in
Weight: 2786.61 oz

## 2020-03-07 LAB — GLUCOSE, CAPILLARY
Glucose-Capillary: 183 mg/dL — ABNORMAL HIGH (ref 70–99)
Glucose-Capillary: 162 mg/dL — ABNORMAL HIGH (ref 70–99)
Glucose-Capillary: 118 mg/dL — ABNORMAL HIGH (ref 70–99)
Glucose-Capillary: 136 mg/dL — ABNORMAL HIGH (ref 70–99)

## 2020-03-07 LAB — MAGNESIUM
Magnesium: 1.6 mg/dL — ABNORMAL LOW (ref 1.7–2.4)
Magnesium: 2 mg/dL (ref 1.7–2.4)

## 2020-03-07 LAB — HEPARIN INDUCED PLATELET AB (HIT ANTIBODY): Heparin Induced Plt Ab: 0.103 OD (ref 0.000–0.400)

## 2020-03-07 LAB — PHOSPHORUS: Phosphorus: 3.1 mg/dL (ref 2.5–4.6)

## 2020-03-07 MED ORDER — SODIUM CHLORIDE 0.9 % IV SOLN
0.0700 mg/kg/h | INTRAVENOUS | Status: DC
  Administered 2020-03-07: 0.05 mg/kg/h via INTRAVENOUS
  Filled 2020-03-07: qty 250

## 2020-03-07 MED ORDER — SODIUM CHLORIDE 0.9% IV SOLUTION: Freq: Once | INTRAVENOUS | Status: AC

## 2020-03-07 MED ORDER — SODIUM CHLORIDE 0.9 % IV BOLUS
1000.0000 mL | Freq: Once | INTRAVENOUS | Status: AC
  Administered 2020-03-07: 1000 mL via INTRAVENOUS

## 2020-03-07 MED ORDER — MAGNESIUM SULFATE 4 GM/100ML IV SOLN: 4.0000 g | Freq: Once | INTRAVENOUS | Status: DC

## 2020-03-07 MED ORDER — ALBUMIN HUMAN 25 % IV SOLN
25.0000 g | Freq: Four times a day (QID) | INTRAVENOUS | Status: AC
  Administered 2020-03-07 – 2020-03-08 (×4): 25 g via INTRAVENOUS
  Filled 2020-03-07 (×4): qty 100

## 2020-03-07 MED ORDER — ALBUMIN HUMAN 5 % IV SOLN
INTRAVENOUS | Status: AC
  Filled 2020-03-07: qty 250

## 2020-03-07 MED ORDER — SODIUM CHLORIDE 0.9 % IV BOLUS: 1000.0000 mL | Freq: Once | INTRAVENOUS | Status: DC

## 2020-03-07 MED ORDER — INSULIN ASPART 100 UNIT/ML ~~LOC~~ SOLN
0.0000 [IU] | Freq: Three times a day (TID) | SUBCUTANEOUS | Status: DC
  Administered 2020-03-07: 3 [IU] via SUBCUTANEOUS
  Administered 2020-03-08: 2 [IU] via SUBCUTANEOUS
  Administered 2020-03-08: 3 [IU] via SUBCUTANEOUS
  Administered 2020-03-08: 2 [IU] via SUBCUTANEOUS
  Administered 2020-03-09: 3 [IU] via SUBCUTANEOUS
  Administered 2020-03-09: 5 [IU] via SUBCUTANEOUS
  Administered 2020-03-09 – 2020-03-11 (×5): 2 [IU] via SUBCUTANEOUS
  Administered 2020-03-11: 3 [IU] via SUBCUTANEOUS
  Administered 2020-03-12: 2 [IU] via SUBCUTANEOUS
  Administered 2020-03-12: 3 [IU] via SUBCUTANEOUS
  Administered 2020-03-12: 2 [IU] via SUBCUTANEOUS
  Administered 2020-03-13: 3 [IU] via SUBCUTANEOUS
  Administered 2020-03-13: 2 [IU] via SUBCUTANEOUS
  Administered 2020-03-14: 3 [IU] via SUBCUTANEOUS
  Administered 2020-03-14 (×2): 2 [IU] via SUBCUTANEOUS
  Administered 2020-03-15: 3 [IU] via SUBCUTANEOUS
  Administered 2020-03-15 (×2): 2 [IU] via SUBCUTANEOUS
  Administered 2020-03-16 (×2): 3 [IU] via SUBCUTANEOUS
  Administered 2020-03-17: 2 [IU] via SUBCUTANEOUS
  Administered 2020-03-17: 5 [IU] via SUBCUTANEOUS
  Administered 2020-03-17: 2 [IU] via SUBCUTANEOUS
  Administered 2020-03-18: 8 [IU] via SUBCUTANEOUS
  Administered 2020-03-18 – 2020-03-19 (×2): 2 [IU] via SUBCUTANEOUS
  Administered 2020-03-19: 5 [IU] via SUBCUTANEOUS

## 2020-03-07 MED ORDER — CHLORHEXIDINE GLUCONATE CLOTH 2 % EX PADS
6.0000 | MEDICATED_PAD | Freq: Every day | CUTANEOUS | Status: DC
  Administered 2020-03-06 – 2020-03-12 (×7): 6 via TOPICAL

## 2020-03-07 MED ORDER — CLOPIDOGREL BISULFATE 75 MG PO TABS
75.0000 mg | ORAL_TABLET | Freq: Every day | ORAL | Status: DC
  Administered 2020-03-07 – 2020-03-19 (×12): 75 mg via ORAL
  Filled 2020-03-07 (×13): qty 1

## 2020-03-07 MED ORDER — SODIUM CHLORIDE 0.9 % IV SOLN
INTRAVENOUS | Status: DC | PRN
  Administered 2020-03-07 – 2020-03-08 (×2): 1000 mL via INTRAVENOUS

## 2020-03-07 NOTE — Progress Notes (Addendum)
Critical Care Note    03/07/2020 7:13 AM 1 Day Post-Op  Subjective:  Case reviewed.  Seen and evaluated this morning with Dr. Carlis Abbott.  Drop in BP last night given IVFs and albumin. H and H low but stable without evidence of bleeding. Findings of new onset left atrial thrombus, hx post-op afib.; thrombocytopenia>>CCM consulted rec; - Stop heparin and start bivalirudin per TCTS. Given one unit of PCs  He is currently complaining of right hip thigh pain but denies chest pain, shortness of breath nausea or vomiting.  He is tolerating clear liquids currently.  Vitals:   03/07/20 0645 03/07/20 0700  BP:  96/76  Pulse: 89 93  Resp: (!) 23 (!) 25  Temp:  98.4 F (36.9 C)  SpO2: 00% 938%   Systolic HW:29-937J  overnight HR:80-90s UOP:1515cc  Physical Exam: General: Alert and oriented x4 in no apparent distress Cardiac: Rate and rhythm are regular Lungs: Clear to auscultation bilaterally Incisions: Bilateral groin incisions inspected.  Minimal edema on the left without evidence of hematoma in either groin. Extremities: Intact sensation and motor function both lower extremities.  He has brisk Doppler signals of the right peroneal and posterior tibial.  Brisk Doppler signal of the left dorsalis pedis.  Some serosanguineous strikethrough from right lower leg onto bed sheet.  Both feet are warm. Abdomen: Nondistended, soft  CBC    Component Value Date/Time   WBC 9.6 03/07/2020 0459   RBC 2.94 (L) 03/07/2020 0459   HGB 8.1 (L) 03/07/2020 0459   HCT 25.4 (L) 03/07/2020 0459   PLT 39 (L) 03/07/2020 0459   MCV 86.4 03/07/2020 0459   MCH 27.6 03/07/2020 0459   MCHC 31.9 03/07/2020 0459   RDW 14.3 03/07/2020 0459   LYMPHSABS 2.1 03/06/2020 1417   MONOABS 0.6 03/06/2020 1417   EOSABS 0.1 03/06/2020 1417   BASOSABS 0.0 03/06/2020 1417    BMET    Component Value Date/Time   NA 141 03/06/2020 1432   K 3.6 03/06/2020 1432   CL 104 03/06/2020 1432   CO2 22 03/06/2020 1417   GLUCOSE  179 (H) 03/06/2020 1432   BUN 14 03/06/2020 1432   CREATININE 0.90 03/06/2020 1432   CALCIUM 9.3 03/06/2020 1417   GFRNONAA >60 03/06/2020 1417   GFRAA >60 03/06/2020 1417     Intake/Output Summary (Last 24 hours) at 03/07/2020 0713 Last data filed at 03/07/2020 0700 Gross per 24 hour  Intake 5965.44 ml  Output 1515 ml  Net 4450.44 ml    HOSPITAL MEDICATIONS Scheduled Meds: . Chlorhexidine Gluconate Cloth  6 each Topical Daily  . docusate sodium  100 mg Oral Daily  . insulin aspart  0-15 Units Subcutaneous TID WC  . pantoprazole (PROTONIX) IV  40 mg Intravenous Daily   Continuous Infusions: . sodium chloride 10 mL/hr at 03/07/20 0700  . bivalirudin (ANGIOMAX) infusion 0.5 mg/mL (Non-ACS indications) 0.06 mg/kg/hr (03/07/20 0700)  . ceFAZolin    .  ceFAZolin (ANCEF) IV Stopped (03/07/20 0252)  . magnesium sulfate bolus IVPB    . sodium chloride     PRN Meds:.sodium chloride, acetaminophen **OR** acetaminophen, alum & mag hydroxide-simeth, docusate sodium, guaiFENesin-dextromethorphan, magnesium sulfate bolus IVPB, metoprolol tartrate, morphine injection, ondansetron, oxyCODONE, phenol, polyethylene glycol, potassium chloride, senna-docusate, white petrolatum  Systems Assessment: Hemodynamics: But not requiring vasopressors.  Fluid resuscitation underway. Pulmonary: Nonlabored, SaO2 98% on room air Renal: Adequate urine output currently.  Normal serum creatinine Vascular: Lower extremities well perfused with intact motor function and sensation ID: Remains afebrile.  Normal white blood cell count Nutrition: Tolerating clear liquids this morning.  Advance as tolerated Hematologic: Acute blood loss anemia status post 1 unit packed cells.  Continue to monitor   Plan: -Status post bilateral lower extremity thrombectomy.  Right lower extremity 4 compartment fasciotomy.  Lower extremities perfused.  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   HIT panel results pending.  Plan right lower leg dressing change tomorrow.  Continue bed rest and intensive care monitoring today.  Recheck CBC, BMP later today  -DVT prophylaxis: bivalirudin   Risa Grill, PA-C [facility] [phone] 03/07/2020  7:13 AM  I have seen and evaluated the patient. I agree with the PA note as documented above.  Postop day 1 status post thrombectomy of both limbs of previous aortobiiliac stent graft for repair of AAA and lower extremity thrombectomies and right leg fasciotomy.  His groins are clean dry and intact.  He has good Doppler signals right PT and peroneal and left DP PT.  Remains on Angiomax with suspected HIT.  HIT panel and serotonin assay are pending but these are send out labs and likely delayed results.  Hemoglobin 8.1 this morning after transfusion of 1 unit overnight.  Hemodynamics are better.  Given he was not reversed in the OR fasciotomies remained oozy in the right leg but seemed to have better hemostasis this morning.  We will delay changing his dressings until tomorrow.  Will check another CBC BMP this afternoon to ensure he does not need any additional transfusions.  Motor and sensation are improved to the right leg.  Appreciate CT surgery input for new left atrial thrombus.  Appreciate critical care management as well.  Marty Heck, MD [facility] [place] Office: [phone]

## 2020-03-07 NOTE — Progress Notes (Signed)
[place] for Bivalirudin  Indication: LA thrombus, occluded aortic stent graft, s/p OR   Allergies  Allergen Reactions  . Erythromycin Shortness Of Breath and Swelling  . Simvastatin Other (See Comments)    CAUSE MUSCLE PAIN,.  . Statins Other (See Comments)    Muscle pain    Patient Measurements: Height: 6\' 3"  (190.5 cm) Weight: 92.5 kg (203 lb 14.8 oz) IBW/kg (Calculated) : 84.5   HEPARIN DW (KG): 79  Vital Signs: Temp: 98.3 F (36.8 C) (07/16 1131) Temp Source: Oral (07/16 0700) BP: 115/65 (07/16 1400) Pulse Rate: 80 (07/16 1400)  Labs: Recent Labs    03/06/20 1417 03/06/20 1417 03/06/20 1432 03/06/20 1432 03/06/20 2158 03/06/20 2158 03/07/20 0010 03/07/20 0459 03/07/20 1000 03/07/20 1430  HGB 10.9*   < > 11.9*   < >  --   --  7.9* 8.1*  --   --   HCT 35.8*   < > 35.0*  --   --   --  24.5* 25.4*  --   --   PLT 51*  --   --   --   --   --  41* 39*  --   --   APTT  --   --   --   --  159*   < >  --  45* 49* 52*  LABPROT 15.3*  --   --   --   --   --   --   --   --   --   INR 1.3*  --   --   --   --   --   --   --   --   --   CREATININE 1.08  --  0.90  --   --   --   --  1.06  --   --   TROPONINIHS 12  --   --   --  27*  --   --   --   --   --    < > = values in this interval not displayed.    Estimated Creatinine Clearance: 74.2 mL/min (by C-G formula based on SCr of 1.06 mg/dL).    Assessment: 74 y.o. 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 Patient was taken to OR for thrombectomy and intraop TEE revealed LA thrombus. Pharmacy initially consulted for IV heparin dosing, but as patient has suspected HIT, pharmacy has been consulted for bivalirudin dosing.   Patient takes apixaban at home for atrial fibrillation, last PTA dose 7/15 morning. Platelets 51 today, down from previous 186 two months ago.  Baseline INR 1.3  PTT now therapeutic at 52 sec after increasing bivalirudin to 0.07 mg/kg/hr. No overt/worsening bleeding or infusion issues.  Goal of Therapy:  aPTT 50-85 secs Monitor platelets by anticoagulation protocol: Yes  Plan:  Continue bivalirudin at 0.07 mg/kg/hr Check aPTT with AM labs Monitor daily aPTT, CBC, s/sx bleeding F/u HIT Ab and SRA results  Richardine Service, PharmD PGY2 Cardiology Pharmacy Resident Phone: [phone] 03/07/2020  3:04 PM  Please check [email or web address] for unit-specific pharmacy phone numbers.

## 2020-03-07 NOTE — Progress Notes (Signed)
[place] for Bivalirudin  Indication: LA thrombus, occluded aortic stent graft, s/p OR   Allergies  Allergen Reactions  . Erythromycin Shortness Of Breath and Swelling  . Simvastatin Other (See Comments)    CAUSE MUSCLE PAIN,.  . Statins Other (See Comments)    Muscle pain    Patient Measurements: Height: 6\' 3"  (190.5 cm) Weight: 92.5 kg (203 lb 14.8 oz) IBW/kg (Calculated) : 84.5   HEPARIN DW (KG): 79  Vital Signs: Temp: 98.4 F (36.9 C) (07/16 0700) Temp Source: Oral (07/16 0700) BP: 83/55 (07/16 0900) Pulse Rate: 86 (07/16 0900)  Labs: Recent Labs    03/06/20 1417 03/06/20 1417 03/06/20 1432 03/06/20 1432 03/06/20 2158 03/07/20 0010 03/07/20 0459  HGB 10.9*   < > 11.9*   < >  --  7.9* 8.1*  HCT 35.8*   < > 35.0*  --   --  24.5* 25.4*  PLT 51*  --   --   --   --  41* 39*  APTT  --   --   --   --  159*  --  45*  LABPROT 15.3*  --   --   --   --   --   --   INR 1.3*  --   --   --   --   --   --   CREATININE 1.08  --  0.90  --   --   --  1.06  TROPONINIHS 12  --   --   --  27*  --   --    < > = values in this interval not displayed.    Estimated Creatinine Clearance: 74.2 mL/min (by C-G formula based on SCr of 1.06 mg/dL).    Assessment: 74 y.o. 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 Patient was taken to OR for thrombectomy and intraop TEE revealed LA thrombus. Pharmacy initially consulted for IV heparin dosing, but as patient has suspected HIT, pharmacy has been consulted for bivalirudin dosing.   Patient takes apixaban at home for atrial fibrillation, last PTA dose 7/15 morning. Platelets 51 today, down from previous 186 two months ago. Baseline INR 1.3  PTT now up to 49 sec but remains slightly subtherapeutic after increasing bivalirudin to 0.06 mg/kg/hr. RN states that bleeding is improved/stable,  no infusion issues.   Goal of Therapy:  aPTT 50-85 secs Monitor platelets by anticoagulation protocol: Yes  Plan:  Increase bivalirudin slightly to 0.07 mg/kg/hr Recheck PTT in ~3 hours Monitor daily aPTT, CBC, s/sx bleeding F/u HIT Ab and SRA results  Richardine Service, PharmD PGY2 Cardiology Pharmacy Resident Phone: [phone] 03/07/2020  11:22 AM  Please check [email or web address] for unit-specific pharmacy phone numbers.

## 2020-03-07 NOTE — Progress Notes (Signed)
NAME:  [name], MRN:  [id number], DOB:  June 10, 1946, LOS: 1 ADMISSION DATE:  03/06/2020, CONSULTATION DATE:  03/06/2020 REFERRING MD:  Dr. Carlis Abbott, CHIEF COMPLAINT:  Ischemic right lower extremity   Brief History   74 y/o male with recent aortic endovascular stent presented 7/15 with right leg numbness & parashesia. He was found to have an ischemic right lower extremity and large apical thrombus.  Vascular surgery was consulted and originally admitted patient.  Intraoperative TEE with large apical thrombus. Given critical status patient will be admitted to CVICU with PCCM consultation post surgery.  Past Medical History  Asthma  CAD Type 2 diabetes  Abdominal aortic aneurysm status post endovascular stent PVD Hypertension Asthma  Significant Hospital Events   Admitted 7/15  Consults:  Vascular surgery PCCM  Procedures:    Significant Diagnostic Tests:  CT angio AO + bifem 7/15 >> Right limb of the bifurcated aortic stent graft is occluded. This is a new finding and consistent with patient's right leg symptoms. Concern for filling defects in the left atrium.  Right runoff has slightly changed since 01/14/2020. There is now single-vessel runoff from the peroneal artery. Previously, there was flow at the ankle from the posterior tibial artery. The lack of flow in the distal posterior tibial artery could be related to slow flow from the occluded stent limb but cannot exclude embolic disease involving the right runoff vessels. No significant change in the right outflow disease. Right SFA and right popliteal artery remain patent. There continues to be focal narrowing in the right popliteal artery possibly related to a dissection. Left iliac artery stents remain patent. No significant change in the left outflow or runoff vessels. No acute abnormality in the abdomen or pelvis. 7/15 multiple thrombectomies during vascular surgery with 4 compartment fasciotomies by Dr. Ferman Hamming Data:   Covid 7/15 > negative  Antimicrobials:     Interim history/subjective:  Afebrile  Glucose 183-200 UOP 1.3L, +4.4L in last 24 hours  Wants to know when he can go home.  Objective   Blood pressure 96/76, pulse 93, temperature 98.4 F (36.9 C), temperature source Oral, resp. rate (!) 25, height 6\' 3"  (1.905 m), weight 92.5 kg, SpO2 100 %.        Intake/Output Summary (Last 24 hours) at 03/07/2020 0803 Last data filed at 03/07/2020 0700 Gross per 24 hour  Intake 5965.44 ml  Output 1515 ml  Net 4450.44 ml   Filed Weights   03/06/20 1520 03/06/20 2300 03/07/20 0430  Weight: 79 kg 91.4 kg 92.5 kg    Examination: GEN: no acute distress sitting in bed HEENT: MMM, trachea midline CV: RRR, ext warm PULM: Clear, no wheezing GI: Soft, hypoactive BS EXT: R leg wrapped with strikethrough NEURO: Moves all 4 ext to command PSYCH: RASS 0, good insight SKIN: No rashes, groin sites soft  CBC, BMP benign, mag a bit low being repleted PTT 49  Resolved Hospital Problem list     Assessment & Plan:   Acute bilateral occlusions of aortobiliac graft Left apical thrombus Seen on intraoperative TEE -continue anticoagulation per TCTS, VVS -wound care, post op care per VVS -follow neurovascular checks   History of hypertension History of CAD and PVD Medication reconciliation not yet completed but initial review reveals home medications include Eliquis, Plavix, Cardura, Imdur, lisinopril, and Crestor -tele monitoring  -hold BP meds for now given soft pressures, fine to resume plavix  Thrombocytopenia Plt count 51 on admission, Plt count was 186 on 01/21/2020 -follow  CBC -monitor for bleeding  -follow up HIT panel   Post op hypotension- mild, giving some more albumin today  At Risk AKI -Trend BMP / urinary output -Replace electrolytes as indicated -Avoid nephrotoxic agents, ensure adequate renal perfusion  Diabetes Home medications include Metformin -SSI  -hold home  metformin  -CBG ACHS  Best practice:  Diet: advance as tolerated Pain/Anxiety/Delirium protocol (if indicated): N/A VAP protocol (if indicated): N/A DVT prophylaxis: bival Per vascular surgery GI prophylaxis: PPI Glucose control: SSI Mobility: Strict bedrest Code Status: Full code Family Communication: updated wife at length Disposition: ICU given soft pressures and neuro checks  32 min cc time Erskine Emery MD

## 2020-03-07 NOTE — Plan of Care (Signed)
  Problem: Education: Goal: Knowledge of General Education information will improve Description: Including pain rating scale, medication(s)/side effects and non-pharmacologic comfort measures Outcome: Progressing   Problem: Health Behavior/Discharge Planning: Goal: Ability to manage health-related needs will improve Outcome: Progressing   Problem: Clinical Measurements: Goal: Ability to maintain clinical measurements within normal limits will improve Outcome: Progressing Goal: Will remain free from infection Outcome: Progressing Goal: Diagnostic test results will improve Outcome: Progressing Goal: Respiratory complications will improve Outcome: Progressing Goal: Cardiovascular complication will be avoided Outcome: Progressing   Problem: Activity: Goal: Risk for activity intolerance will decrease Outcome: Progressing   Problem: Nutrition: Goal: Adequate nutrition will be maintained Outcome: Progressing   Problem: Coping: Goal: Level of anxiety will decrease Outcome: Progressing   Problem: Elimination: Goal: Will not experience complications related to bowel motility Outcome: Progressing Goal: Will not experience complications related to urinary retention Outcome: Progressing   Problem: Pain Managment: Goal: General experience of comfort will improve Outcome: Progressing   Problem: Safety: Goal: Ability to remain free from injury will improve Outcome: Progressing   Problem: Skin Integrity: Goal: Risk for impaired skin integrity will decrease Outcome: Progressing   Problem: Clinical Measurements: Goal: Signs and symptoms of graft occlusion will improve Outcome: Progressing   Problem: Skin Integrity: Goal: Demonstration of wound healing without infection will improve Outcome: Progressing

## 2020-03-07 NOTE — Progress Notes (Signed)
PT Cancellation Note  Patient Details Name: [name] MRN: [id number] DOB: [birth year]   Cancelled Treatment:    Reason Eval/Treat Not Completed: Patient not medically ready. Per vascular note pt to remain on bedrest today.   Shary Decamp [facility] 03/07/2020, 8:59 AM [place] Pager [phone] Office [phone]

## 2020-03-07 NOTE — Progress Notes (Signed)
OT Cancellation Note  Patient Details Name: Bayani Renteria MRN: [id number] DOB: 1945/12/28   Cancelled Treatment:    Reason Eval/Treat Not Completed: Other (comment). Per PA note, pt to continue with bedrest at this time. Will follow and assess when medically appropriate.   Ramond Dial, OT/L   Acute OT Clinical Specialist Acute Rehabilitation Services Pager [phone] Office [phone]  03/07/2020, 8:54 AM

## 2020-03-07 NOTE — Progress Notes (Signed)
1 Day Post-Op Procedure(s) (LRB): THROMBECTOMY OF RIGHT LIMB OF BIFURCATED AORTO-BI-ILIAC STENT GRAFT; RIGHT LOWER EXTREMITY THROMBECTOMY USING THE FEMORAL APPROACH; REDO EXPOSURE LEFT COMMON FEMORAL ARTERY; THROMBECTOMY LEFT LIMB OF AORTO-BI-ILIAC STENT GRAFT;  LEFT LOWER EXTREMITY THROMBECTOMY USING THE FEMORAL APPROACH (Bilateral) RIGHT LOWER EXTREMITY FOUR COMPARTMENT FASCIOTOMIES (Right) AORTOGRAM WITH BILATERAL ILIAC ARTERIOGRAM Subjective: Alert and breathing comfortably C/o pain R hip, buttock NSR Kerlex around R leg with serosang drainage bival at .06 mcg PTT 45 plts cont to drop 39k after heparin exposure in OR  Objective: Vital signs in last 24 hours: Temp:  [96.9 F (36.1 C)-98.4 F (36.9 C)] 98.4 F (36.9 C) (07/16 0700) Pulse Rate:  [67-93] 89 (07/16 0800) Cardiac Rhythm: Normal sinus rhythm (07/16 0800) Resp:  [12-31] 25 (07/16 0700) BP: (49-155)/(34-87) 99/63 (07/16 0800) SpO2:  [97 %-100 %] 100 % (07/16 0800) Arterial Line BP: (52-155)/(35-68) 81/68 (07/16 0800) Weight:  [78.5 kg-92.5 kg] 92.5 kg (07/16 0430)  Hemodynamic parameters for last 24 hours:    Intake/Output from previous day: 07/15 0701 - 07/16 0700 In: 5965.4 [P.O.:600; I.V.:3319.1; Blood:342.5; IV Piggyback:1703.9] Out: 1515 [Urine:1390; Blood:125] Intake/Output this shift: Total I/O In: 24.5 [I.V.:24.5] Out: -   No cardiac murmur Legs warm  Lab Results: Recent Labs    03/07/20 0010 03/07/20 0459  WBC 7.8 9.6  HGB 7.9* 8.1*  HCT 24.5* 25.4*  PLT 41* 39*   BMET:  Recent Labs    03/06/20 1417 03/06/20 1417 03/06/20 1432 03/07/20 0459  NA 139   < > 141 138  K 3.7   < > 3.6 4.2  CL 106   < > 104 107  CO2 22  --   --  20*  GLUCOSE 179*   < > 179* 207*  BUN 13   < > 14 13  CREATININE 1.08   < > 0.90 1.06  CALCIUM 9.3  --   --  8.4*   < > = values in this interval not displayed.    PT/INR:  Recent Labs    03/06/20 1417  LABPROT 15.3*  INR 1.3*   ABG    Component Value  Date/Time   HCO3 27.3 12/17/2019 1413   TCO2 22 03/06/2020 1432   ACIDBASEDEF 1.0 07/14/2007 1633   O2SAT 100.0 12/17/2019 1413   CBG (last 3)  Recent Labs    03/06/20 1956 03/06/20 2234 03/07/20 0656  GLUCAP 189* 267* 183*    Assessment/Plan: S/P Procedure(s) (LRB): THROMBECTOMY OF RIGHT LIMB OF BIFURCATED AORTO-BI-ILIAC STENT GRAFT; RIGHT LOWER EXTREMITY THROMBECTOMY USING THE FEMORAL APPROACH; REDO EXPOSURE LEFT COMMON FEMORAL ARTERY; THROMBECTOMY LEFT LIMB OF AORTO-BI-ILIAC STENT GRAFT;  LEFT LOWER EXTREMITY THROMBECTOMY USING THE FEMORAL APPROACH (Bilateral) RIGHT LOWER EXTREMITY FOUR COMPARTMENT FASCIOTOMIES (Right) AORTOGRAM WITH BILATERAL ILIAC ARTERIOGRAM  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

## 2020-03-07 NOTE — Progress Notes (Signed)
[place] for Bivalirudin  Indication: LA thrombus, occluded aortic stent graft, s/p OR   Allergies  Allergen Reactions  . Erythromycin Shortness Of Breath and Swelling  . Simvastatin Other (See Comments)    CAUSE MUSCLE PAIN,.  . Statins Other (See Comments)    Muscle pain    Patient Measurements: Height: 6\' 3"  (190.5 cm) Weight: 92.5 kg (203 lb 14.8 oz) IBW/kg (Calculated) : 84.5   HEPARIN DW (KG): 79  Vital Signs: Temp: 97.8 F (36.6 C) (07/16 0400) Temp Source: Oral (07/16 0400) BP: 104/75 (07/16 0515) Pulse Rate: 90 (07/16 0530)  Labs: Recent Labs    03/06/20 1417 03/06/20 1417 03/06/20 1432 03/06/20 2158 03/07/20 0010 03/07/20 0459  HGB 10.9*   < > 11.9*  --  7.9*  --   HCT 35.8*  --  35.0*  --  24.5*  --   PLT 51*  --   --   --  41*  --   APTT  --   --   --  159*  --  45*  LABPROT 15.3*  --   --   --   --   --   INR 1.3*  --   --   --   --   --   CREATININE 1.08  --  0.90  --   --   --   TROPONINIHS 12  --   --  27*  --   --    < > = values in this interval not displayed.    Estimated Creatinine Clearance: 87.4 mL/min (by C-G formula based on SCr of 0.9 mg/dL).    Assessment: 74 y.o. 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 Patient was taken to OR for thrombectomy and intraop TEE revealed LA thrombus. Pharmacy initially consulted for IV heparin dosing, but as patient has suspected HIT, pharmacy has been consulted for bivalirudin dosing.   Patient takes apixaban at home for atrial fibrillation, last PTA dose 7/15 morning. Platelets 51 today, down from previous 186 two months ago. Baseline INR 1.3  PTT now down to 45 sec (slightly subtherapeutic) on bivalirudin at 0.05mg /kg/hr. RN states that bleeding is much better - still slight ooze at fasciotomy site.  Goal of Therapy:  aPTT 50-85  secs Monitor platelets by anticoagulation protocol: Yes  Plan:  Increase bivalirudin slightly to 0.06 mg/kg/hr Recheck PTT in ~3 hours  Sherlon Handing, PharmD, BCPS Please see amion for complete clinical pharmacist phone list 03/07/2020 5:49 AM

## 2020-03-07 NOTE — Progress Notes (Signed)
Approx [phone]  Pt's BP trending downwards, CCM notified, gave 500 NS IV bolus per vascular prn orders and albumin per CCM orders.   PTT elevated, angiomax held per pharmacy. RLE fasciotomy incision bled through earlier, dressing reinforced, slight ooze now . Bilat groin sites unchanged (hematoma L groin).   Pt complained of R hip/groin pain and acutely decompensated with SBP 40-60s, mentation maintained but endorsed feeling weak. HR and oxygen stable throughout.  Dr Carlis Abbott notified  Stat ipoc done, Hgb not readable, stat CBC sent and Hgb = 7.9 (down from 11.9), ordered and hung emergent PRBC x 1, NS 1 L given.  Pt responded well, BP improved, more alert.   Angiomax off x 1.5 hr and restarted at 1/3 of previous dose per pharmacy, recheck PTT in 4 hr.

## 2020-03-07 NOTE — Progress Notes (Signed)
[place] for Bivalirudin  Indication: LA thrombus, occluded aortic stent graft, s/p OR   Allergies  Allergen Reactions  . Erythromycin Shortness Of Breath and Swelling  . Simvastatin Other (See Comments)    CAUSE MUSCLE PAIN,.  . Statins Other (See Comments)    Muscle pain    Patient Measurements: Height: 6\' 3"  (190.5 cm) Weight: 91.4 kg (201 lb 8 oz) IBW/kg (Calculated) : 84.5   HEPARIN DW (KG): 79  Vital Signs: Temp: 97.9 F (36.6 C) (07/16 0053) Temp Source: Oral (07/16 0053) BP: 91/58 (07/16 0045) Pulse Rate: 82 (07/16 0109)  Labs: Recent Labs    03/06/20 1417 03/06/20 1417 03/06/20 1432 03/06/20 2158 03/07/20 0010  HGB 10.9*   < > 11.9*  --  7.9*  HCT 35.8*  --  35.0*  --  24.5*  PLT 51*  --   --   --  41*  APTT  --   --   --  159*  --   LABPROT 15.3*  --   --   --   --   INR 1.3*  --   --   --   --   CREATININE 1.08  --  0.90  --   --   TROPONINIHS 12  --   --  27*  --    < > = values in this interval not displayed.    Estimated Creatinine Clearance: 87.4 mL/min (by C-G formula based on SCr of 0.9 mg/dL).   Medical History: Past Medical History:  Diagnosis Date  . Asthma   . Coronary artery disease   . Diabetes mellitus without complication ([place])   . History of kidney stones   . Hypertension   . Peripheral vascular disease [facility])     Assessment: 74 y.o. 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 Patient was taken to OR for thrombectomy and intraop TEE revealed LA thrombus. Pharmacy initially consulted for IV heparin dosing, but as patient has suspected HIT, pharmacy has been consulted for bivalirudin dosing.   Patient takes apixaban at home for atrial fibrillation, last PTA dose 7/15 morning. Platelets 51 today, down from previous 186 two months ago. Baseline INR 1.3  7/16 AM update:  APTT  was 159, drawn correctly, pt was oozing some from fasciotomy sites. Post-op Hgb down to 7.9, getting 1 unit of blood. Held bival for 1 hour.   Goal of Therapy:  aPTT 50-85 secs Monitor platelets by anticoagulation protocol: Yes  Plan:  Re-start Bivalirudin at reduced dose of 0.05 mg/kg/hr Re-check aPTT at [place], PharmD, [place] Pharmacist Phone: [phone]

## 2020-03-08 DIAGNOSIS — I998 Other disorder of circulatory system: Secondary | ICD-10-CM

## 2020-03-08 LAB — TYPE AND SCREEN
ABO/RH(D): O POS
Antibody Screen: NEGATIVE
Unit division: 0
Unit division: 0
Unit division: 0

## 2020-03-08 LAB — CBC
HCT: 24.3 % — ABNORMAL LOW (ref 39.0–52.0)
Hemoglobin: 7.9 g/dL — ABNORMAL LOW (ref 13.0–17.0)
MCH: 28 pg (ref 26.0–34.0)
MCHC: 32.5 g/dL (ref 30.0–36.0)
MCV: 86.2 fL (ref 80.0–100.0)
Platelets: 34 10*3/uL — ABNORMAL LOW (ref 150–400)
RBC: 2.82 MIL/uL — ABNORMAL LOW (ref 4.22–5.81)
RDW: 14.2 % (ref 11.5–15.5)
WBC: 9.5 10*3/uL (ref 4.0–10.5)
nRBC: 0 % (ref 0.0–0.2)

## 2020-03-08 LAB — BPAM RBC
Blood Product Expiration Date: 202108072359
Blood Product Expiration Date: 202108162359
Blood Product Expiration Date: 202108162359
ISSUE DATE / TIME: 202107160030
ISSUE DATE / TIME: 202107161813
ISSUE DATE / TIME: 202107162049
Unit Type and Rh: 5100
Unit Type and Rh: 5100
Unit Type and Rh: 5100

## 2020-03-08 LAB — BASIC METABOLIC PANEL
Anion gap: 8 (ref 5–15)
BUN: 10 mg/dL (ref 8–23)
CO2: 24 mmol/L (ref 22–32)
Calcium: 8.7 mg/dL — ABNORMAL LOW (ref 8.9–10.3)
Chloride: 107 mmol/L (ref 98–111)
Creatinine, Ser: 0.98 mg/dL (ref 0.61–1.24)
GFR calc Af Amer: >60 mL/min (ref >60)
GFR calc non Af Amer: >60 mL/min (ref >60)
Glucose, Bld: 162 mg/dL — ABNORMAL HIGH (ref 70–99)
Potassium: 3.8 mmol/L (ref 3.5–5.1)
Sodium: 139 mmol/L (ref 135–145)

## 2020-03-08 LAB — MAGNESIUM: Magnesium: 1.8 mg/dL (ref 1.7–2.4)

## 2020-03-08 LAB — GLUCOSE, CAPILLARY
Glucose-Capillary: 152 mg/dL — ABNORMAL HIGH (ref 70–99)
Glucose-Capillary: 133 mg/dL — ABNORMAL HIGH (ref 70–99)
Glucose-Capillary: 112 mg/dL — ABNORMAL HIGH (ref 70–99)

## 2020-03-08 LAB — APTT: aPTT: 49 seconds — ABNORMAL HIGH (ref 24–36)

## 2020-03-08 MED ORDER — POTASSIUM CHLORIDE CRYS ER 20 MEQ PO TBCR
20.0000 meq | EXTENDED_RELEASE_TABLET | Freq: Once | ORAL | Status: AC
  Administered 2020-03-08: 20 meq via ORAL
  Filled 2020-03-08: qty 1

## 2020-03-08 MED ORDER — DILTIAZEM HCL-DEXTROSE 125-5 MG/125ML-% IV SOLN (PREMIX)
5.0000 mg/h | INTRAVENOUS | Status: AC
  Administered 2020-03-08: 5 mg/h via INTRAVENOUS
  Filled 2020-03-08: qty 125

## 2020-03-08 MED ORDER — BUDESONIDE 0.25 MG/2ML IN SUSP: 0.2500 mg | Freq: Two times a day (BID) | RESPIRATORY_TRACT | Status: DC

## 2020-03-08 MED ORDER — SODIUM CHLORIDE 0.9 % IV SOLN
0.0950 mg/kg/h | INTRAVENOUS | Status: DC
  Administered 2020-03-08: 0.07 mg/kg/h via INTRAVENOUS
  Administered 2020-03-09 – 2020-03-14 (×6): 0.095 mg/kg/h via INTRAVENOUS
  Filled 2020-03-08 (×10): qty 250

## 2020-03-08 MED ORDER — DILTIAZEM HCL 60 MG PO TABS
60.0000 mg | ORAL_TABLET | Freq: Four times a day (QID) | ORAL | Status: DC
  Administered 2020-03-08 – 2020-03-11 (×12): 60 mg via ORAL
  Filled 2020-03-08 (×12): qty 1

## 2020-03-08 MED ORDER — ALBUTEROL SULFATE (2.5 MG/3ML) 0.083% IN NEBU
2.5000 mg | INHALATION_SOLUTION | RESPIRATORY_TRACT | Status: DC | PRN
  Administered 2020-03-13: 2.5 mg via RESPIRATORY_TRACT
  Filled 2020-03-08: qty 3

## 2020-03-08 MED ORDER — IPRATROPIUM-ALBUTEROL 0.5-2.5 (3) MG/3ML IN SOLN: 3.0000 mL | Freq: Four times a day (QID) | RESPIRATORY_TRACT | Status: DC

## 2020-03-08 MED ORDER — MAGNESIUM SULFATE 2 GM/50ML IV SOLN
2.0000 g | Freq: Once | INTRAVENOUS | Status: AC
  Administered 2020-03-08: 2 g via INTRAVENOUS
  Filled 2020-03-08: qty 50

## 2020-03-08 NOTE — Progress Notes (Addendum)
NAME:  [name], MRN:  [id number], DOB:  October 12, 1945, LOS: 2 ADMISSION DATE:  03/06/2020, CONSULTATION DATE:  03/06/2020 REFERRING MD:  Dr. Carlis Abbott, CHIEF COMPLAINT:  Ischemic right lower extremity   Brief History   74 y/o male with recent aortic endovascular stent presented 7/15 with right leg numbness & parashesia. He was found to have an ischemic right lower extremity and large apical thrombus.  Vascular surgery was consulted and originally admitted patient.  Intraoperative TEE with large apical thrombus. Given critical status patient will be admitted to CVICU with PCCM consultation post surgery.  Past Medical History  Asthma  CAD Type 2 diabetes  Abdominal aortic aneurysm status post endovascular stent PVD Hypertension Asthma  Significant Hospital Events   Admitted 7/15  Consults:  Vascular surgery PCCM CT Surgery Procedures:  7/15 Thrombectomy of both limbs of previous aortobiliac stent graft for repair of AAA and lower extremity thrombectomies and right leg fasciotomy.   Significant Diagnostic Tests:  CT angio AO + bifem 7/15 >> Right limb of the bifurcated aortic stent graft is occluded. This is a new finding and consistent with patient's right leg symptoms. Concern for filling defects in the left atrium.  Right runoff has slightly changed since 01/14/2020. There is now single-vessel runoff from the peroneal artery. Previously, there was flow at the ankle from the posterior tibial artery. The lack of flow in the distal posterior tibial artery could be related to slow flow from the occluded stent limb but cannot exclude embolic disease involving the right runoff vessels. No significant change in the right outflow disease. Right SFA and right popliteal artery remain patent. There continues to be focal narrowing in the right popliteal artery possibly related to a dissection. Left iliac artery stents remain patent. No significant change in the left outflow or runoff vessels. No acute  abnormality in the abdomen or pelvis. 7/15 multiple thrombectomies during vascular surgery with 4 compartment fasciotomies by Dr. Ferman Hamming Data:  Covid 7/15 > negative  Antimicrobials:   Cefazolin 7/15 - 7/16: surgical prophylaxis   Interim history/subjective:  No acute events overnight. He did not work with PT/OT yesterday, he is on bedrest.   This morning patient is having some cramps in left leg. We reviewed his care so far. All questions and concerns were addressed.   Objective   Blood pressure (!) 142/74, pulse 87, temperature 97.8 F (36.6 C), temperature source Oral, resp. rate (!) 28, height 6\' 3"  (1.905 m), weight 92.5 kg, SpO2 98 %.        Intake/Output Summary (Last 24 hours) at 03/08/2020 0707 Last data filed at 03/08/2020 0700 Gross per 24 hour  Intake 1445.93 ml  Output 2190 ml  Net -744.07 ml   Filed Weights   03/06/20 1520 03/06/20 2300 03/07/20 0430  Weight: 79 kg 91.4 kg 92.5 kg    Examination:  General: NAD, nl appearance HE: Normocephalic, atraumatic , EOMI, Conjunctivae normal ENT: No congestion, no rhinorrhea, no exudate or erythema  Cardiovascular: Normal rate, regular rhythm.  No murmurs, rubs, or gallops Pulmonary : Effort normal, breath sounds normal. No wheezes, rales, or rhonchi Abdominal: soft, nontender,  bowel sounds present Musculoskeletal: Right leg wrapped in soft bandage, warm distal extremities. Right leg edema. Skin: Warm, dry  Psychiatric/Behavioral:  normal mood, normal behavior  Neuro: Equal sensation of distal lower ext, Alert and oriented x 3   Hgb 7.9 , platelets 34, Mg 1.8 , K 3.8, Glucose 162  Resolved Hospital Problem list  Assessment & Plan:   Acute bilateral occlusions of aortobiliac graft Left apical thrombus -POD#2, for thrombectomy of both limbs of previous aortobiliac stent graft for repair of AAA and lower extremity thrombectomies and right leg fasciotomy.  - CT surgery evaluated patient with  interoperative TEE on 7/15, drop in platelets was concerning for HIT, Heparin stopped and Bivalirudin started. Patient not a candidate for cardiotomy while on bivaliruden du to high risk for massive bleeding.   -wound care, post op care per VVS - Continue Bivalirudin , dosing per pharmacy. Platelets stable at 46  History of hypertension History of CAD and PVD Medication reconciliation not yet completed but initial review reveals home medications include Eliquis, Plavix, Cardura, Imdur, lisinopril, and Crestor -tele monitoring  -hold BP meds for now  - Continue Plavix   Thrombocytopenia Plt count 51 on admission, Plt count was 186 on 01/21/2020. Platelets stable at 34. Hit antibody low, SRA pending. Not likely HIT. Possible drop was due to problem #1 -follow CBC -monitor for bleeding   Electrolytes Mg replaced to goal of 2  K replaced to goal of 4  Diabetes Home medications include Metformin. At goal this am.  -SSI  -hold home metformin  -CBG ACHS  Best practice:  Diet: advance as tolerated Pain/Anxiety/Delirium protocol (if indicated): N/A VAP protocol (if indicated): N/A DVT prophylaxis: bival Per vascular surgery GI prophylaxis: PPI Glucose control: SSI Mobility: Strict bedrest Code Status: Full code Family Communication: updated wife at length Disposition: ICU given soft pressures and neuro checks  Tamsen Snider, MD PGY2

## 2020-03-08 NOTE — Evaluation (Signed)
Physical Therapy Evaluation Patient Details Name: [name] MRN: [id number] DOB: [birth year] Today's Date: 03/08/2020   History of Present Illness  74 year old male with hx AAA s/p repair in 12/2019 with endovascular stent graft s/p left iliac thrombectomy and left external iliac stent, PVD, HTN, DM2, CAD, hx post-op atrial fibrillation who presented with R hip pain and leg parasthesia.On 7/15, he underwent emergent thrombectomy of right and left limb of aorto biiliac endograft and faciotomy R LE for acute on chronic limb ischemia.  Intra-op TEE demonstrated mobile left atrial thrombus, but managed non-operative.  Clinical Impression  Patient presents with decreased mobility due to pain in R LE, decreased strength/ROM and decreased balance and activity tolerance needing +2 for mobility up to EOB and stand pivot to recliner.  Just had dressing changed so in severe pain with R LE, but RN medicating.  He was previously mobilizing with walker versus cane on his own at home.  Has supportive wife who also cares for her mother in an ALF, but feel he should progress when pain controlled and be able to mobilize well enough to d/c home with family support and follow up HHPT.  Will continue to follow acutely.     Follow Up Recommendations Home health PT;Supervision/Assistance - 24 hour    Equipment Recommendations  None recommended by PT    Recommendations for Other Services       Precautions / Restrictions Precautions Precautions: Fall Precaution Comments: R LE fasciotomy, possibly pending wound vac      Mobility  Bed Mobility Overal bed mobility: Needs Assistance Bed Mobility: Supine to Sit     Supine to sit: HOB elevated;Mod assist;+2 for physical assistance     General bed mobility comments: assist for R leg off bed and to lift trunk  Transfers Overall transfer level: Needs assistance Equipment used: Rolling walker (2 wheeled) Transfers: Sit to/from [facility] Sit  to Stand: Mod assist;+2 physical assistance;From elevated surface Stand pivot transfers: Mod assist;+2 physical assistance       General transfer comment: Limited weight tolerance on R LE and decreased anterior weight shift due to pain in groins bilateral so lifting help from EOB, then standing pivotal steps on L LE with heavy reliance on RW to recliner mod A +2 for balance, safety, lines, cues for technique  Ambulation/Gait                Stairs            Wheelchair Mobility    Modified Rankin (Stroke Patients Only)       Balance Overall balance assessment: Needs assistance Sitting-balance support: Feet supported Sitting balance-Leahy Scale: Fair     Standing balance support: Bilateral upper extremity supported Standing balance-Leahy Scale: Poor Standing balance comment: heavy UE support needed                             Pertinent Vitals/Pain Pain Assessment: 0-10 Pain Score: 10-Worst pain ever Pain Location: R leg just s/p packing by MD Pain Descriptors / Indicators: Sore;Sharp Pain Intervention(s): Monitored during session;Repositioned;Limited activity within patient's tolerance    Home Living Family/patient expects to be discharged to:: Private residence Living Arrangements: Spouse/significant other Available Help at Discharge: Family;Available 24 hours/day Type of Home: House Home Access: Stairs to enter Entrance Stairs-Rails: None Entrance Stairs-Number of Steps: 3 Home Layout: One level Home Equipment: Walker - 2 wheels;Cane - single point;Shower seat;Grab bars - tub/shower  Prior Function Level of Independence: Needs assistance   Gait / Transfers Assistance Needed: modified indpendent with cane vs. walker in home. Activity decr in past weeks.           Hand Dominance        Extremity/Trunk Assessment   Upper Extremity Assessment Upper Extremity Assessment: LUE deficits/detail;RUE deficits/detail RUE Deficits /  Details: pain with shoulder elevation > 100, otherwise WFL LUE Deficits / Details: pain with shoulder elevation > 100, otherwise WFL    Lower Extremity Assessment Lower Extremity Assessment: LLE deficits/detail;RLE deficits/detail RLE Deficits / Details: surgeon just changed dressing on lower leg so very painful with ace wrap, wiggles toes, assist to lift antigravity, some weak quad activation present LLE Deficits / Details: hip flexion painful at groin, strength at least 3/5       Communication   Communication: No difficulties  Cognition Arousal/Alertness: Awake/alert Behavior During Therapy: WFL for tasks assessed/performed Overall Cognitive Status: Within Functional Limits for tasks assessed                                        General Comments General comments (skin integrity, edema, etc.): Noted some audible crackles in lungs improved after coughing and upright posture, though painful    Exercises Other Exercises Other Exercises: Encouaged ankle pumps and knee presses, performed about 3   Assessment/Plan    PT Assessment Patient needs continued PT services  PT Problem List Decreased strength;Decreased mobility;Pain;Impaired sensation;Decreased knowledge of use of DME;Decreased balance;Decreased activity tolerance;Decreased range of motion;Decreased safety awareness       PT Treatment Interventions DME instruction;Therapeutic activities;Gait training;Therapeutic exercise;Patient/family education;Functional mobility training;Balance training;Stair training    PT Goals (Current goals can be found in the Care Plan section)  Acute Rehab PT Goals Patient Stated Goal: to return to independent PT Goal Formulation: With patient Time For Goal Achievement: 03/22/20 Potential to Achieve Goals: Good    Frequency Min 3X/week   Barriers to discharge        Co-evaluation               AM-PAC PT "6 Clicks" Mobility  Outcome Measure Help needed turning  from your back to your side while in a flat bed without using bedrails?: A Little Help needed moving from lying on your back to sitting on the side of a flat bed without using bedrails?: A Lot Help needed moving to and from a bed to a chair (including a wheelchair)?: Total Help needed standing up from a chair using your arms (e.g., wheelchair or bedside chair)?: Total Help needed to walk in hospital room?: Total Help needed climbing 3-5 steps with a railing? : Total 6 Click Score: 9    End of Session Equipment Utilized During Treatment: Gait belt Activity Tolerance: Patient limited by pain Patient left: in chair;with call bell/phone within reach;with nursing/sitter in room   PT Visit Diagnosis: Other abnormalities of gait and mobility (R26.89);Pain;Difficulty in walking, not elsewhere classified (R26.2);Muscle weakness (generalized) (M62.81) Pain - Right/Left: Right Pain - part of body: Leg    Time: 0355-9741 PT Time Calculation (min) (ACUTE ONLY): 29 min   Charges:   PT Evaluation $PT Eval Moderate Complexity: 1 Mod PT Treatments $Therapeutic Activity: 8-22 mins        Magda Kiel, PT Acute Rehabilitation Services [facility]:[phone] [phone] 03/08/2020   Reginia Naas 03/08/2020, 1:28 PM

## 2020-03-08 NOTE — Progress Notes (Addendum)
[facility] for Bivalirudin  Indication: LA thrombus, occluded aortic stent graft, s/p OR   Allergies  Allergen Reactions  . Erythromycin Shortness Of Breath and Swelling  . Simvastatin Other (See Comments)    CAUSE MUSCLE PAIN,.  . Statins Other (See Comments)    Muscle pain    Patient Measurements: Height: 6\' 3"  (190.5 cm) Weight: 92.5 kg (203 lb 14.8 oz) IBW/kg (Calculated) : 84.5   HEPARIN DW (KG): 79 kg  Vital Signs: Temp: 99.4 F (37.4 C) (07/17 0730) Temp Source: Oral (07/17 0300) BP: 143/74 (07/17 1000) Pulse Rate: 85 (07/17 1026)  Labs: Recent Labs    03/06/20 1417 03/06/20 1432 03/06/20 2158 03/07/20 0010 03/07/20 0459 03/07/20 0459 03/07/20 1000 03/07/20 1430 03/07/20 1700 03/08/20 0415  HGB 10.9*   < >  --    < > 8.1*   < >  --   --  6.7* 7.9*  HCT 35.8*   < >  --    < > 25.4*  --   --   --  21.0* 24.3*  PLT 51*  --   --    < > 39*  --   --   --  37* 34*  APTT  --   --  159*   < > 45*  --  49* 52*  --   --   LABPROT 15.3*  --   --   --   --   --   --   --   --   --   INR 1.3*  --   --   --   --   --   --   --   --   --   CREATININE 1.08   < >  --   --  1.06  --   --   --  1.00 0.98  TROPONINIHS 12  --  27*  --   --   --   --   --   --   --    < > = values in this interval not displayed.    Estimated Creatinine Clearance: 80.2 mL/min (by C-G formula based on SCr of 0.98 mg/dL).    Assessment: 74 y.o. 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 Patient was taken to OR for thrombectomy and intraop TEE revealed LA thrombus. Pharmacy initially consulted for IV heparin dosing, but was changed to bivalirudin for possible HIT.  HIT antibody (0.103) negative for HIT, however pharmacy asked to continue bivalirudin for now given continued low platelets. APTT (49) slightly subtherapeutic on bivalirudin 0.07  mg/kg/hr. Hgb low but improved at 7.9, platelets stable at 34 but down from 186 two months ago. No overt bleeding or infusion issues.   Goal of Therapy:  aPTT 50-85 secs Monitor platelets by anticoagulation protocol: Yes  Plan:  Increase bivalirudin to 0.08 mg/kg/hr Check aPTT with AM labs Monitor daily aPTT, CBC, s/sx bleeding F/u HIT SRA results  Richardine Service, PharmD PGY2 Cardiology Pharmacy Resident Phone: [phone] 03/08/2020  11:14 AM  Please check [email or web address] for unit-specific pharmacy phone numbers.

## 2020-03-08 NOTE — Progress Notes (Signed)
Patient signed out to [facility]. They will take over as primary at 7am on 7/18.

## 2020-03-08 NOTE — Progress Notes (Signed)
   A Fib RVR 140s  Plan  -s tart cardizem gtt (known  A fib)     SIGNATURE    Dr. Brand Males, M.D., F.C.C.P,  Pulmonary and Critical Care Medicine Staff Physician, [place] Director - Interstitial Lung Disease  Program  Pulmonary [place] [facility] [place], [place], [address]  Pager: [phone], If no answer  OR between  19:00-7:00h: page [phone] Telephone (clinical office): [phone] Telephone (research): [phone]  11:55 AM 03/08/2020

## 2020-03-08 NOTE — Progress Notes (Signed)
After each pain medication, patients gets confused and hallucinates seeing cats. Easily re oriented to reality.

## 2020-03-08 NOTE — Progress Notes (Signed)
[facility]  Subjective  - feels ok some numbness in toes   Objective 124/75 85 99.4 F (37.4 C) (!) 24 97%  Intake/Output Summary (Last 24 hours) at 03/08/2020 0944 Last data filed at 03/08/2020 0900 Gross per 24 hour  Intake 1591.24 ml  Output 2540 ml  Net -948.76 ml   Doppler signals in feet Right leg fasciotomy clean muscle appears viable  Assessment/Planning: Continue bival Hopefully close fasciotomy early next week Ok to get out of bed and ambulate Will see if we can get a VAC for the fasciotomies Acute blood loss anemia stable  Ruta Hinds 03/08/2020 9:44 AM --  Laboratory Lab Results: Recent Labs    03/07/20 1700 03/08/20 0415  WBC 10.3 9.5  HGB 6.7* 7.9*  HCT 21.0* 24.3*  PLT 37* 34*   BMET Recent Labs    03/07/20 1700 03/08/20 0415  NA 137 139  K 3.5 3.8  CL 107 107  CO2 21* 24  GLUCOSE 133* 162*  BUN 13 10  CREATININE 1.00 0.98  CALCIUM 8.6* 8.7*    COAG Lab Results  Component Value Date   INR 1.3 (H) 03/06/2020   INR 1.1 01/15/2020   INR 1.0 01/14/2020   No results found for: PTT

## 2020-03-09 DIAGNOSIS — I482 Chronic atrial fibrillation, unspecified: Secondary | ICD-10-CM

## 2020-03-09 DIAGNOSIS — J439 Emphysema, unspecified: Secondary | ICD-10-CM

## 2020-03-09 LAB — CBC
HCT: 24.6 % — ABNORMAL LOW (ref 39.0–52.0)
Hemoglobin: 7.9 g/dL — ABNORMAL LOW (ref 13.0–17.0)
MCH: 28.4 pg (ref 26.0–34.0)
MCHC: 32.1 g/dL (ref 30.0–36.0)
MCV: 88.5 fL (ref 80.0–100.0)
Platelets: 49 10*3/uL — ABNORMAL LOW (ref 150–400)
RBC: 2.78 MIL/uL — ABNORMAL LOW (ref 4.22–5.81)
RDW: 14.1 % (ref 11.5–15.5)
WBC: 11.2 10*3/uL — ABNORMAL HIGH (ref 4.0–10.5)
nRBC: 0.3 % — ABNORMAL HIGH (ref 0.0–0.2)

## 2020-03-09 LAB — BASIC METABOLIC PANEL
Anion gap: 10 (ref 5–15)
BUN: 9 mg/dL (ref 8–23)
CO2: 23 mmol/L (ref 22–32)
Calcium: 9 mg/dL (ref 8.9–10.3)
Chloride: 103 mmol/L (ref 98–111)
Creatinine, Ser: 0.99 mg/dL (ref 0.61–1.24)
GFR calc Af Amer: >60 mL/min (ref >60)
GFR calc non Af Amer: >60 mL/min (ref >60)
Glucose, Bld: 139 mg/dL — ABNORMAL HIGH (ref 70–99)
Potassium: 3.9 mmol/L (ref 3.5–5.1)
Sodium: 136 mmol/L (ref 135–145)

## 2020-03-09 LAB — GLUCOSE, CAPILLARY
Glucose-Capillary: 128 mg/dL — ABNORMAL HIGH (ref 70–99)
Glucose-Capillary: 146 mg/dL — ABNORMAL HIGH (ref 70–99)
Glucose-Capillary: 177 mg/dL — ABNORMAL HIGH (ref 70–99)
Glucose-Capillary: 214 mg/dL — ABNORMAL HIGH (ref 70–99)
Glucose-Capillary: 133 mg/dL — ABNORMAL HIGH (ref 70–99)

## 2020-03-09 LAB — APTT
aPTT: 38 seconds — ABNORMAL HIGH (ref 24–36)
aPTT: 63 seconds — ABNORMAL HIGH (ref 24–36)

## 2020-03-09 LAB — MAGNESIUM: Magnesium: 2 mg/dL (ref 1.7–2.4)

## 2020-03-09 MED ORDER — UMECLIDINIUM BROMIDE 62.5 MCG/INH IN AEPB
1.0000 | INHALATION_SPRAY | Freq: Every day | RESPIRATORY_TRACT | Status: DC
  Administered 2020-03-12 – 2020-03-19 (×8): 1 via RESPIRATORY_TRACT
  Filled 2020-03-09 (×2): qty 7

## 2020-03-09 MED ORDER — POTASSIUM CHLORIDE CRYS ER 20 MEQ PO TBCR
20.0000 meq | EXTENDED_RELEASE_TABLET | Freq: Once | ORAL | Status: AC
  Administered 2020-03-09: 20 meq via ORAL
  Filled 2020-03-09: qty 1

## 2020-03-09 NOTE — Progress Notes (Signed)
[facility] for Bivalirudin  Indication: LA thrombus, occluded aortic stent graft, s/p OR   Allergies  Allergen Reactions  . Erythromycin Shortness Of Breath and Swelling  . Simvastatin Other (See Comments)    CAUSE MUSCLE PAIN,.  . Statins Other (See Comments)    Muscle pain    Patient Measurements: Height: 6\' 3"  (190.5 cm) Weight: 92.9 kg (204 lb 12.9 oz) IBW/kg (Calculated) : 84.5   HEPARIN DW (KG): 79 kg  Vital Signs: Temp: 99.7 F (37.6 C) (07/18 0400) Temp Source: Oral (07/18 0400) BP: 125/58 (07/18 0700) Pulse Rate: 71 (07/18 0700)  Labs: Recent Labs    03/06/20 1417 03/06/20 1432 03/06/20 2158 03/07/20 0010 03/07/20 0459 03/07/20 1430 03/07/20 1700 03/07/20 1700 03/08/20 0415 03/08/20 1435 03/09/20 0301  HGB 10.9*   < >  --    < >   < >  --  6.7*   < > 7.9*  --  7.9*  HCT 35.8*   < >  --    < >   < >  --  21.0*  --  24.3*  --  24.6*  PLT 51*  --   --    < >   < >  --  37*  --  34*  --  49*  APTT  --   --  159*   < >  --  52*  --   --   --  49* 38*  LABPROT 15.3*  --   --   --   --   --   --   --   --   --   --   INR 1.3*  --   --   --   --   --   --   --   --   --   --   CREATININE 1.08   < >  --    < >   < >  --  1.00  --  0.98  --  0.99  TROPONINIHS 12  --  27*  --   --   --   --   --   --   --   --    < > = values in this interval not displayed.    Estimated Creatinine Clearance: 79.4 mL/min (by C-G formula based on SCr of 0.99 mg/dL).    Assessment: 74 y.o. 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 Patient was taken to OR for thrombectomy and intraop TEE revealed LA thrombus. Pharmacy initially consulted for IV heparin dosing, but was changed to bivalirudin for possible HIT.  HIT antibody (0.103) negative for HIT, however pharmacy asked to continue bivalirudin for now given continued low  platelets.   APTT (38) trend down and subtherapeutic after increasing bivalirudin to 0.08 mg/kg/hr. Hgb low but stable at 7.9, platelets improved to 49 (baseline 186 two months ago). RN reports pt accidentally pulled out IV at ~0400 this AM after labs were drawn. No overt bleeding.    Goal of Therapy:  aPTT 50-85 secs Monitor platelets by anticoagulation protocol: Yes  Plan:  Increase bivalirudin to 0.095 mg/kg/hr Check 2-hr aPTT Monitor daily aPTT, CBC, s/sx bleeding F/u HIT SRA results  Richardine Service, PharmD PGY2 Cardiology Pharmacy Resident Phone: [phone] 03/09/2020  7:09 AM  Please check [email or web address] for unit-specific pharmacy phone numbers.

## 2020-03-09 NOTE — Progress Notes (Signed)
[facility]  Subjective  - feels ok, rapid afib yesterday   Objective 137/69 79 98.4 F (36.9 C) (!) 21 98%  Intake/Output Summary (Last 24 hours) at 03/09/2020 1029 Last data filed at 03/09/2020 0800 Gross per 24 hour  Intake 457.75 ml  Output 1050 ml  Net -592.25 ml   Sinus rhythm currently Feet pink warm bilaterally Fasciotomy dressing dry  Assessment/Planning: Patent aortic graft Will make NPO tonight hopefully close fasciotomies tomorrow Continue Angiomax Dr Donzetta Matters to review long term anticoagulation plan tomorrow.  HIT antibody negative.  Serotonin release pending  Ruta Hinds 03/09/2020 10:29 AM --  Laboratory Lab Results: Recent Labs    03/08/20 0415 03/09/20 0301  WBC 9.5 11.2*  HGB 7.9* 7.9*  HCT 24.3* 24.6*  PLT 34* 49*   BMET Recent Labs    03/08/20 0415 03/09/20 0301  NA 139 136  K 3.8 3.9  CL 107 103  CO2 24 23  GLUCOSE 162* 139*  BUN 10 9  CREATININE 0.98 0.99  CALCIUM 8.7* 9.0    COAG Lab Results  Component Value Date   INR 1.3 (H) 03/06/2020   INR 1.1 01/15/2020   INR 1.0 01/14/2020   No results found for: PTT

## 2020-03-09 NOTE — Progress Notes (Addendum)
NAME:  [name], MRN:  [id number], DOB:  1946/07/19, LOS: 3 ADMISSION DATE:  03/06/2020, CONSULTATION DATE:  03/06/2020 REFERRING MD:  Dr. Carlis Abbott, CHIEF COMPLAINT:  Ischemic right lower extremity   Brief History   74 y/o male with recent aortic endovascular stent presented 7/15 with right leg numbness & parashesia. He was found to have an ischemic right lower extremity and large apical thrombus.  Vascular surgery was consulted and originally admitted patient.  Intraoperative TEE with large apical thrombus. Given critical status patient will be admitted to CVICU with PCCM consultation post surgery.  Past Medical History  Asthma  CAD Type 2 diabetes  Abdominal aortic aneurysm status post endovascular stent PVD Hypertension Asthma  Significant Hospital Events   Admitted 7/15  7/17 - No acute events overnight. He did not work with PT/OT yesterday, he is on bedrest.     Consults:  Vascular surgery PCCM CT Surgery Procedures:  7/15 Thrombectomy of both limbs of previous aortobiliac stent graft for repair of AAA and lower extremity thrombectomies and right leg fasciotomy.   Significant Diagnostic Tests:  CT angio AO + bifem 7/15 >> Right limb of the bifurcated aortic stent graft is occluded. This is a new finding and consistent with patient's right leg symptoms. Concern for filling defects in the left atrium.  Right runoff has slightly changed since 01/14/2020. There is now single-vessel runoff from the peroneal artery. Previously, there was flow at the ankle from the posterior tibial artery. The lack of flow in the distal posterior tibial artery could be related to slow flow from the occluded stent limb but cannot exclude embolic disease involving the right runoff vessels. No significant change in the right outflow disease. Right SFA and right popliteal artery remain patent. There continues to be focal narrowing in the right popliteal artery possibly related to a dissection. Left iliac  artery stents remain patent. No significant change in the left outflow or runoff vessels. No acute abnormality in the abdomen or pelvis . 7/15 multiple thrombectomies during vascular surgery with 4 compartment fasciotomies by Dr. Ferman Hamming Data:  Covid 7/15 > negative  Antimicrobials:   Cefazolin 7/15 - 7/16: surgical prophylaxis   Interim history/subjective:   7/18 -platelets improved to 40 9K.  HIT antibody negative.  Serotonin release assay pending.  Vascular service prefers to continue Angiomax.  Plan is for fasciotomy closure tomorrow.  Had rapid A. fib and was briefly on Cardizem infusion.  Currently off after change to cardizem off. HR 70s and stable  He continues albuterol as needed for possible baseline COPD. Wife at bedside - confirmed he is on STIRVEDI (LABA) at home. Transfer to floor cancelld yesterday.  Currently doing well.  Objective   Blood pressure 137/69, pulse 77, temperature 98.4 F (36.9 C), resp. rate (!) 24, height 6\' 3"  (1.905 m), weight 92.9 kg, SpO2 99 %.        Intake/Output Summary (Last 24 hours) at 03/09/2020 1125 Last data filed at 03/09/2020 0800 Gross per 24 hour  Intake 457.75 ml  Output 1050 ml  Net -592.25 ml   Filed Weights   03/06/20 2300 03/07/20 0430 03/09/20 0500  Weight: 91.4 kg 92.5 kg 92.9 kg    Examination: General Appearance:  Looks stable. In bed. Watching TV Head:  Normocephalic, without obvious abnormality, atraumatic Eyes:  PERRL - yes, conjunctiva/corneas - muddy     Ears:  Normal external ear canals, both ears Nose:  G tube - no Throat:  ETT TUBE -  no , OG tube - no Neck:  Supple,  No enlargement/tenderness/nodules Lungs: Clear to auscultation bilaterally,  Heart:  S1 and S2 normal, no murmur, CVP - no.  Pressors - no Abdomen:  Soft, no masses, no organomegaly Genitalia / Rectal:  Not done Extremities:  Extremities- intact Skin:  ntact in exposed areas . Sacral area - x Neurologic:  Sedation - none -> RASS - +1 .  Moves all 4s - yes. CAM-ICU - neg . Orientation - x3+    Resolved Hospital Problem list     Assessment & Plan:   Acute bilateral occlusions of aortobiliac graft Left apical thrombus -POD#3, for thrombectomy of both limbs of previous aortobiliac stent graft for repair of AAA and lower extremity thrombectomies and right leg fasciotomy.  - CT surgery evaluated patient with interoperative TEE on 7/15, drop in platelets was concerning for HIT, Heparin stopped and Bivalirudin started. Patient not a candidate for cardiotomy while on bivaliruden du to high risk for massive bleeding.  - 7/18 - plat better. SRA pending  Plan -wound care, post op care per VVS - Continue Bivalirudin , dosing per pharmacy. - VVS managing    History of hypertension History of CAD and PVD - on plavix x man years Hx of A fib x new in 2021 but pre-admit. On eliquis ony at home Echo this admission with left atrial clot Medication reconciliation not yet completed but initial review reveals home medications include Eliquis, Plavix, Cardura, Imdur, lisinopril, and Crestor  03/09/2020 -brief A. fib RVR yesterday.  Start on Cardizem infusion with transition to Cardizem p.o.  Currently heart rate 70s  plan -Continue oral Cardizem -check twelve-lead EKG -Lopressor as needed -hold other home BP meds for now  - Continue Plavix  -Continue Angiomax  Thrombocytopenia Plt count 51 on admission, Plt count was 186 on 01/21/2020. Platelets stable at 34. Hit antibody neg, SRA pending. Not likely HIT. Possible drop was due to problem #1  03/09/2020: Tolerating Angiomax without bleeding.  Platelet count slowly improving to the 40s  Plan  -Angiomax per previous section =-> transition to IV heparin per the VS/cardiology -Oral Daily Plavix -follow CBC -monitor for bleeding    #Baseline COPD on albuterol as needed and olodaterol chronic maintenance  03/09/2020: Stable without any wheeze  Plan -Continue albuterol as  needed -Start daily Spiriva while in the hospital instead of long-acting beta agonist because of A. Fib  #Anemia of critical illness  03/09/2020: No visible bleeding  Plan  - PRBC for hgb </= 6.9gm%    - exceptions are   -  if ACS susepcted/confirmed then transfuse for hgb </= 8.0gm%,  or    -  active bleeding with hemodynamic instability, then transfuse regardless of hemoglobin value   At at all times try to transfuse 1 unit prbc as possible with exception of active hemorrhage    Diabetes Home medications include Metformin. At goal this am.  -SSI  -hold home metformin  -CBG ACHS  Best practice:  Diet: advance as tolerated Pain/Anxiety/Delirium protocol (if indicated): N/A VAP protocol (if indicated): N/A DVT prophylaxis: bival Per vascular surgery GI prophylaxis: PPI Glucose control: SSI Mobility: Strict bedrest Code Status: Full code Family Communication: Wife at bedside 03/09/2020 Disposition: Moved to telemetry.  [facility] hospitalist to assume primary care 03/10/2020 - d/w Dr Arlana Hove    ATTESTATION & SIGNATURE    Dr. Brand Males, M.D., [facility].C.P Pulmonary and Critical Care Medicine Staff Physician [facility] Pager: [phone]  [phone], If no answer or between  15:00h - 7:00h: call [phone]  03/09/2020 11:25 AM      LABS    PULMONARY Recent Labs  Lab 03/06/20 1432 03/06/20 1807  PHART  --  7.418  PCO2ART  --  35.4  PO2ART  --  136*  HCO3  --  23.2  TCO2 22 24  O2SAT  --  99.0    CBC Recent Labs  Lab 03/07/20 1700 03/08/20 0415 03/09/20 0301  HGB 6.7* 7.9* 7.9*  HCT 21.0* 24.3* 24.6*  WBC 10.3 9.5 11.2*  PLT 37* 34* 49*    COAGULATION Recent Labs  Lab 03/06/20 1417  INR 1.3*    CARDIAC  No results for input(s): TROPONINI in the last 168 hours. No results for input(s): PROBNP in the last 168 hours.   CHEMISTRY Recent Labs  Lab 03/06/20 1417 03/06/20 1417 03/06/20 1432  03/06/20 1432 03/06/20 1807 03/06/20 1807 03/07/20 0459 03/07/20 0459 03/07/20 1700 03/07/20 1700 03/08/20 0415 03/09/20 0301  NA 139   < > 141   < > 139  --  138  --  137  --  139 136  K 3.7   < > 3.6   < > 4.2   < > 4.2   < > 3.5   < > 3.8 3.9  CL 106   < > 104  --   --   --  107  --  107  --  107 103  CO2 22  --   --   --   --   --  20*  --  21*  --  24 23  GLUCOSE 179*   < > 179*  --   --   --  207*  --  133*  --  162* 139*  BUN 13   < > 14  --   --   --  13  --  13  --  10 9  CREATININE 1.08   < > 0.90  --   --   --  1.06  --  1.00  --  0.98 0.99  CALCIUM 9.3  --   --   --   --   --  8.4*  --  8.6*  --  8.7* 9.0  MG  --   --   --   --   --   --  1.6*  --  2.0  --  1.8 2.0  PHOS  --   --   --   --   --   --  3.1  --   --   --   --   --    < > = values in this interval not displayed.   Estimated Creatinine Clearance: 79.4 mL/min (by C-G formula based on SCr of 0.99 mg/dL).   LIVER Recent Labs  Lab 03/06/20 1417  INR 1.3*     INFECTIOUS Recent Labs  Lab 03/06/20 1426 03/06/20 2158  LATICACIDVEN 3.3* 2.4*     ENDOCRINE CBG (last 3)  Recent Labs    03/08/20 1532 03/08/20 2105 03/09/20 0642  GLUCAP 128* 112* 146*         IMAGING x48h  - image(s) personally visualized  -   highlighted in bold No results found.

## 2020-03-10 DIAGNOSIS — I714 Abdominal aortic aneurysm, without rupture: Secondary | ICD-10-CM | POA: Diagnosis not present

## 2020-03-10 DIAGNOSIS — I480 Paroxysmal atrial fibrillation: Secondary | ICD-10-CM | POA: Diagnosis not present

## 2020-03-10 DIAGNOSIS — I236 Thrombosis of atrium, auricular appendage, and ventricle as current complications following acute myocardial infarction: Secondary | ICD-10-CM | POA: Diagnosis not present

## 2020-03-10 DIAGNOSIS — I10 Essential (primary) hypertension: Secondary | ICD-10-CM

## 2020-03-10 DIAGNOSIS — T82898S Other specified complication of vascular prosthetic devices, implants and grafts, sequela: Secondary | ICD-10-CM

## 2020-03-10 DIAGNOSIS — I700 Atherosclerosis of aorta: Secondary | ICD-10-CM

## 2020-03-10 DIAGNOSIS — Z720 Tobacco use: Secondary | ICD-10-CM

## 2020-03-10 DIAGNOSIS — I998 Other disorder of circulatory system: Secondary | ICD-10-CM | POA: Diagnosis not present

## 2020-03-10 DIAGNOSIS — E1159 Type 2 diabetes mellitus with other circulatory complications: Secondary | ICD-10-CM

## 2020-03-10 DIAGNOSIS — I25118 Atherosclerotic heart disease of native coronary artery with other forms of angina pectoris: Secondary | ICD-10-CM

## 2020-03-10 DIAGNOSIS — I513 Intracardiac thrombosis, not elsewhere classified: Secondary | ICD-10-CM

## 2020-03-10 DIAGNOSIS — T82898A Other specified complication of vascular prosthetic devices, implants and grafts, initial encounter: Secondary | ICD-10-CM

## 2020-03-10 LAB — APTT: aPTT: 63 seconds — ABNORMAL HIGH (ref 24–36)

## 2020-03-10 LAB — BASIC METABOLIC PANEL
Anion gap: 7 (ref 5–15)
BUN: 12 mg/dL (ref 8–23)
CO2: 27 mmol/L (ref 22–32)
Calcium: 8.8 mg/dL — ABNORMAL LOW (ref 8.9–10.3)
Chloride: 100 mmol/L (ref 98–111)
Creatinine, Ser: 0.95 mg/dL (ref 0.61–1.24)
GFR calc Af Amer: >60 mL/min (ref >60)
GFR calc non Af Amer: >60 mL/min (ref >60)
Glucose, Bld: 161 mg/dL — ABNORMAL HIGH (ref 70–99)
Potassium: 4.3 mmol/L (ref 3.5–5.1)
Sodium: 134 mmol/L — ABNORMAL LOW (ref 135–145)

## 2020-03-10 LAB — CBC
HCT: 25.9 % — ABNORMAL LOW (ref 39.0–52.0)
Hemoglobin: 8.2 g/dL — ABNORMAL LOW (ref 13.0–17.0)
MCH: 28.3 pg (ref 26.0–34.0)
MCHC: 31.7 g/dL (ref 30.0–36.0)
MCV: 89.3 fL (ref 80.0–100.0)
Platelets: 69 10*3/uL — ABNORMAL LOW (ref 150–400)
RBC: 2.9 MIL/uL — ABNORMAL LOW (ref 4.22–5.81)
RDW: 14 % (ref 11.5–15.5)
WBC: 11.5 10*3/uL — ABNORMAL HIGH (ref 4.0–10.5)
nRBC: 0.2 % (ref 0.0–0.2)

## 2020-03-10 LAB — GLUCOSE, CAPILLARY
Glucose-Capillary: 137 mg/dL — ABNORMAL HIGH (ref 70–99)
Glucose-Capillary: 146 mg/dL — ABNORMAL HIGH (ref 70–99)
Glucose-Capillary: 137 mg/dL — ABNORMAL HIGH (ref 70–99)
Glucose-Capillary: 157 mg/dL — ABNORMAL HIGH (ref 70–99)
Glucose-Capillary: 151 mg/dL — ABNORMAL HIGH (ref 70–99)

## 2020-03-10 LAB — PHOSPHORUS: Phosphorus: 2.7 mg/dL (ref 2.5–4.6)

## 2020-03-10 LAB — TROPONIN I (HIGH SENSITIVITY): Troponin I (High Sensitivity): 13 ng/L (ref <18)

## 2020-03-10 LAB — MAGNESIUM: Magnesium: 1.9 mg/dL (ref 1.7–2.4)

## 2020-03-10 MED ORDER — ATORVASTATIN CALCIUM 10 MG PO TABS
20.0000 mg | ORAL_TABLET | Freq: Every day | ORAL | Status: DC
  Administered 2020-03-13 – 2020-03-19 (×7): 20 mg via ORAL
  Filled 2020-03-10 (×9): qty 2

## 2020-03-10 NOTE — Progress Notes (Addendum)
[place] for Bivalirudin  Indication: LA thrombus, occluded aortic stent graft, s/p OR   Allergies  Allergen Reactions  . Erythromycin Shortness Of Breath and Swelling  . Simvastatin Other (See Comments)    CAUSE MUSCLE PAIN,.  . Statins Other (See Comments)    Muscle pain    Patient Measurements: Height: 6\' 3"  (190.5 cm) Weight: 90.4 kg (199 lb 4.8 oz) IBW/kg (Calculated) : 84.5 HEPARIN DW (KG): 79 kg  Vital Signs: Temp: 98.2 F (36.8 C) (07/19 0700) Temp Source: Oral (07/19 0700) BP: 103/64 (07/19 0700) Pulse Rate: 78 (07/19 0700)  Labs: Recent Labs    03/08/20 0415 03/08/20 1435 03/09/20 0301 03/09/20 0921 03/10/20 0234 03/10/20 0623  HGB 7.9*  --  7.9*  --  8.2*  --   HCT 24.3*  --  24.6*  --  25.9*  --   PLT 34*  --  49*  --  69*  --   APTT  --    < > 38* 63* 63*  --   CREATININE 0.98  --  0.99  --  0.95  --   TROPONINIHS  --   --   --   --   --  13   < > = values in this interval not displayed.    Estimated Creatinine Clearance: 82.8 mL/min (by C-G formula based on SCr of 0.95 mg/dL).   Assessment:  74 y.o. 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 Patient was taken to OR for thrombectomy and intraop TEE revealed LA thrombus. Pharmacy initially consulted for IV heparin dosing, but was changed to bivalirudin for possible HIT. 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   APTT is therapeutic, H/H stable, pltc now up to 69k, planning PV lab tomorrow.  Goal of Therapy:  aPTT 50-85 secs Monitor platelets by anticoagulation protocol: Yes  Plan:  Continue bivalirudin to 0.095 mg/kg/hr Monitor daily aPTT, CBC, s/sx bleeding F/u HIT SRA results   [name], PharmD, BCPS Clinical Pharmacist [phone] Please  check AMION for all [place] numbers 03/10/2020

## 2020-03-10 NOTE — Progress Notes (Signed)
Dr. Genevive Bi was made aware of Afib-controlled rate in 80's with stable B/P- no orders received. Pt is on scheduled PO Cardizem and is on Angiomax

## 2020-03-10 NOTE — Progress Notes (Signed)
4 Days Post-Op Procedure(s) (LRB): THROMBECTOMY OF RIGHT LIMB OF BIFURCATED AORTO-BI-ILIAC STENT GRAFT; RIGHT LOWER EXTREMITY THROMBECTOMY USING THE FEMORAL APPROACH; REDO EXPOSURE LEFT COMMON FEMORAL ARTERY; THROMBECTOMY LEFT LIMB OF AORTO-BI-ILIAC STENT GRAFT;  LEFT LOWER EXTREMITY THROMBECTOMY USING THE FEMORAL APPROACH (Bilateral) RIGHT LOWER EXTREMITY FOUR COMPARTMENT FASCIOTOMIES (Right) AORTOGRAM WITH BILATERAL ILIAC ARTERIOGRAM Subjective: Left atrial thrombus, thrombocytopenia Stable on iv bival with PTT 63 RLE faciotomy closure tomorrow REC TEE under anesthesia to re- evaluate LA thrombus  Objective: Vital signs in last 24 hours: Temp:  [97.3 F (36.3 C)-99.2 F (37.3 C)] 98.7 F (37.1 C) (07/19 1500) Pulse Rate:  [71-94] 76 (07/19 0800) Cardiac Rhythm: Normal sinus rhythm (07/19 0800) Resp:  [9-25] 22 (07/19 0800) BP: (91-127)/(64-95) 126/68 (07/19 0800) SpO2:  [98 %-100 %] 99 % (07/19 0800) Weight:  [90.4 kg] 90.4 kg (07/19 0500)  Hemodynamic parameters for last 24 hours:  nsr with intermittent afib  Intake/Output from previous day: 07/18 0701 - 07/19 0700 In: 510.7 [P.O.:100; I.V.:410.7] Out: 800 [Urine:800] Intake/Output this shift: Total I/O In: 86.9 [I.V.:86.9] Out: 400 [Urine:400]  Neuro intact No murmur RLE with fasciotomy dressing in place  Lab Results: Recent Labs    03/09/20 0301 03/10/20 0234  WBC 11.2* 11.5*  HGB 7.9* 8.2*  HCT 24.6* 25.9*  PLT 49* 69*   BMET:  Recent Labs    03/09/20 0301 03/10/20 0234  NA 136 134*  K 3.9 4.3  CL 103 100  CO2 23 27  GLUCOSE 139* 161*  BUN 9 12  CREATININE 0.99 0.95  CALCIUM 9.0 8.8*    PT/INR: No results for input(s): LABPROT, INR in the last 72 hours. ABG    Component Value Date/Time   PHART 7.418 03/06/2020 1807   HCO3 23.2 03/06/2020 1807   TCO2 24 03/06/2020 1807   ACIDBASEDEF 1.0 03/06/2020 1807   O2SAT 99.0 03/06/2020 1807   CBG (last 3)  Recent Labs    03/10/20 0756  03/10/20 1133 03/10/20 1522  GLUCAP 146* 137* 157*    Assessment/Plan: S/P Procedure(s) (LRB): THROMBECTOMY OF RIGHT LIMB OF BIFURCATED AORTO-BI-ILIAC STENT GRAFT; RIGHT LOWER EXTREMITY THROMBECTOMY USING THE FEMORAL APPROACH; REDO EXPOSURE LEFT COMMON FEMORAL ARTERY; THROMBECTOMY LEFT LIMB OF AORTO-BI-ILIAC STENT GRAFT;  LEFT LOWER EXTREMITY THROMBECTOMY USING THE FEMORAL APPROACH (Bilateral) RIGHT LOWER EXTREMITY FOUR COMPARTMENT FASCIOTOMIES (Right) AORTOGRAM WITH BILATERAL ILIAC ARTERIOGRAM Cont bival until SRA test back for HIT TEE while under gen anesthesia tomorrow for closure fasciotomy   LOS: 4 days    [name] 03/10/2020

## 2020-03-10 NOTE — Progress Notes (Signed)
PHARMACIST LIPID MONITORING   [name] is a 74 y.o. male admitted on 03/06/2020 with RLE ischemia and apical thrombus.  Pharmacy has been consulted to optimize lipid-lowering therapy with the indication of secondary prevention for clinical ASCVD.  Recent Labs:  Lipid Panel (last 6 months):   Lab Results  Component Value Date   CHOL 124 03/07/2020   TRIG 72 03/07/2020   HDL 23 (L) 03/07/2020   CHOLHDL 5.4 03/07/2020   VLDL 14 03/07/2020   LDLCALC 87 03/07/2020    Hepatic function panel (last 6 months):   Lab Results  Component Value Date   AST 23 01/16/2020   ALT 12 01/16/2020   ALKPHOS 42 01/16/2020   BILITOT 0.7 01/16/2020    SCr (since admission):   Serum creatinine: 0.95 mg/dL 03/10/20 0234 Estimated creatinine clearance: 82.8 mL/min  Current lipid-lowering therapy: none Previous lipid-lowering therapies (if applicable): simvastatin 80mg , pravastatin 80mg , rosuvastatin 20mg  Documented or reported allergies or intolerances to lipid-lowering therapies (if applicable): muscle/joint pain  Assessment:  Patient agrees with changes to lipid-lowering therapy  Recommendation per protocol: Add alternative statin  Follow-up with:  Cardiology provider - Larae Grooms, MD  Follow-up labs after discharge:    Liver function panel and lipid panel in 8-12 weeks then annually  Plan: -Will order atorvastatin 20mg  daily (pt has not tried this drug previously)  -If pt tolerates, consider increasing to high-intensity (started moderate given prior intolerances x3)  [name], PharmD, BCPS Clinical Pharmacist [phone] Please check AMION for all [place] numbers 03/10/2020

## 2020-03-10 NOTE — Progress Notes (Signed)
PROGRESS NOTE    [name]  OIN:[id number] DOB: [birth year] DOA: 03/06/2020 PCP: [facility]    Brief Narrative:  Patient was admitted to the hospital with acute bilateral occlusion of October iliac grafts, complicated by left atrial thrombus.  74 year old male with a past medical history for abdominal aortic aneurysm status post repair 12/2019 with endovascular stent, graft status post left iliac thrombectomy and left external iliac stent.  He also has peripheral vascular disease, hypertension, type 2 diabetes mellitus, coronary disease and recent postop atrial fibrillation. Patient reported right hip pain and leg paresthesias, for about 2 hours prior to hospitalization, it occurred while he was sitting on a commode. On his initial physical examination his blood pressure was 113/72, heart rate 67, temperature 98.4, respiratory rate 16, oxygen saturation 100%.  His lungs were clear to auscultation bilaterally, heart S1-S2, present rhythmic soft abdomen, he had palpable left femoral pulse but no palpable right femoral pulse.  Right foot was cool.  No motor sensation in the right leg below the inguinal crease.  Profoundly ischemic right leg. Sodium 139, potassium 3.7, chloride 106, bicarb 22, glucose 179, BUN 13, creatinine 1.0, lactic acid 3.3, white count 9.5, hemoglobin 10.9, hematocrit 35.8, platelets 51.  SARS COVID-19 negative. CT angiography of the lower extremities showed right limb of the bifurcated aortic stent graft occluded.  Left iliac artery stent patent. EKG 65 bpm, normal axis, normal intervals, sinus rhythm, no ST segment or T wave changes, poor R wave progression.  Patient underwent emergent thrombectomy of the right limb of aortobiiliac endograft, right lower extremity thrombectomy, thrombectomy of left limb of aortobiiliac endograft, left lower extremity thrombectomy.  Right lower extremity 4 compartment fasciotomies.   Intraoperatively transesophageal echocardiography  showed a mobile left atrial thrombus.  Postoperatively he was transferred to the intensive care unit.  Assessment & Plan:   Principal Problem:   Vascular occlusion Active Problems:   DM (diabetes mellitus) ([place])   Essential hypertension   CAD (coronary artery disease)   Tobacco abuse   AAA (abdominal aortic aneurysm) without rupture (HCC)   Femoral-tibial bypass graft occlusion, right (HCC)   Aortic occlusion (HCC)   AF (paroxysmal atrial fibrillation) (HCC)   Left atrial thrombus   1. Acute bilateral occlusions of aortoiiliac grafts complicated with left atrial thrombus and thrombocytopenia.  Patient sp bilateral lower extremities, thrombectomy and 4 compartment fasciotomies.  Right lower extremity pain is controlled, Hg today is 8,2 and Hct is 28.3. Platelets trending up to 69 from 49. No signs of active bleeding. HIT antibodies negative.   Continue anticoagulation with Bivalirudin, concern for heparin induced thrombocytopenia with thrombosis. Follow with vascular recommendations. Plan for right lower extremity fasciotomy reversal tomorrow.   Pain control with IV morphine, and oral oxycodone.   2. Left atrial thrombus/ paroxysmal atrial fibrillation . Intraoperative TEE with LV systolic function 55 to 70% with mild LVH, preserved RV systolic function, positive large mobile lobulated mass in the LA, likely thrombus, 10 cm x 5 cm, the more distal part extends to the mitral valve during diastole, no significant inflow obstruction.  Continue anticoagulation per CT surgery recommendations, considering possibility of HIT and risk of bleeding.   Continue with diltiazem for rate control.   2. COPD. Stable with no signs of acute exacerbation. Continue oxymetry monitoring and as needed bronchodilator/ incruse ellipta therapy.  3. HTN. Continue blood pressure monitoring.   4. CAD/. No active chest pain.   Continue with atorvastatin and clopidogrel.    5. Uncontrolled T2DM,  Hgb A1c 8,2/  dyslipidemia. Continue glucose cover and monitoring with insulin sliding scale.   Continue with atorvastatin.   6. Hypokalemia. Stable renal function with serum cr at 0,95, bicarbonate at 27 and K at 4,3.   Will continue renal panel and electrolyte monitoring.   Patient continue to be at high risk for bleeding or recurrent thrombosis.   Status is: Inpatient  Remains inpatient appropriate because:IV treatments appropriate due to intensity of illness or inability to take PO   Dispo: The patient is from: Home              Anticipated d/c is to: Home              Anticipated d/c date is: 3 days              Patient currently is not medically stable to d/c.   DVT prophylaxis: bivalirudin  Code Status:   full  Family Communication:  I spoke with patient's wife at the bedside, we talked in detail about patient's condition, plan of care and prognosis and all questions were addressed.       Subjective: Patient with positive pain at the right lower extremity, controlled with analgesics, no nausea or vomiting,   Objective: Vitals:   03/10/20 0600 03/10/20 0700 03/10/20 0800 03/10/20 1139  BP:  103/64 126/68   Pulse: 77 78 76   Resp: (!) 22 19 (!) 22   Temp:  98.2 F (36.8 C)  99.2 F (37.3 C)  TempSrc:  Oral  Oral  SpO2: 100% 99% 99%   Weight:      Height:        Intake/Output Summary (Last 24 hours) at 03/10/2020 1308 Last data filed at 03/10/2020 1200 Gross per 24 hour  Intake 495.03 ml  Output 1200 ml  Net -704.97 ml   Filed Weights   03/07/20 0430 03/09/20 0500 03/10/20 0500  Weight: 92.5 kg 92.9 kg 90.4 kg    Examination:   General: not in pain or dyspnea Neurology: Awake and alert, non focal  E ENT: mild pallor, no icterus, oral mucosa moist Cardiovascular: No JVD. S1-S2 present, rhythmic, no gallops, rubs, or murmurs. No lower extremity edema. Pulmonary: positive breath sounds bilaterally, adequate air movement, no wheezing, rhonchi or  rales. Gastrointestinal. Abdomen soft with  non tender, no rebound or guarding Skin. Right leg with dressing in place.  Musculoskeletal: no joint deformities     Data Reviewed: I have personally reviewed following labs and imaging studies  CBC: Recent Labs  Lab 03/06/20 1417 03/06/20 1432 03/07/20 0459 03/07/20 1700 03/08/20 0415 03/09/20 0301 03/10/20 0234  WBC 9.5   < > 9.6 10.3 9.5 11.2* 11.5*  NEUTROABS 6.6  --   --   --   --   --   --   HGB 10.9*   < > 8.1* 6.7* 7.9* 7.9* 8.2*  HCT 35.8*   < > 25.4* 21.0* 24.3* 24.6* 25.9*  MCV 89.7   < > 86.4 85.4 86.2 88.5 89.3  PLT 51*   < > 39* 37* 34* 49* 69*   < > = values in this interval not displayed.   Basic Metabolic Panel: Recent Labs  Lab 03/07/20 0459 03/07/20 1700 03/08/20 0415 03/09/20 0301 03/10/20 0234  NA 138 137 139 136 134*  K 4.2 3.5 3.8 3.9 4.3  CL 107 107 107 103 100  CO2 20* 21* 24 23 27   GLUCOSE 207* 133* 162* 139* 161*  BUN 13 13  10 9 12   CREATININE 1.06 1.00 0.98 0.99 0.95  CALCIUM 8.4* 8.6* 8.7* 9.0 8.8*  MG 1.6* 2.0 1.8 2.0 1.9  PHOS 3.1  --   --   --  2.7   GFR: Estimated Creatinine Clearance: 82.8 mL/min (by C-G formula based on SCr of 0.95 mg/dL). Liver Function Tests: No results for input(s): AST, ALT, ALKPHOS, BILITOT, PROT, ALBUMIN in the last 168 hours. No results for input(s): LIPASE, AMYLASE in the last 168 hours. No results for input(s): AMMONIA in the last 168 hours. Coagulation Profile: Recent Labs  Lab 03/06/20 1417  INR 1.3*   Cardiac Enzymes: No results for input(s): CKTOTAL, CKMB, CKMBINDEX, TROPONINI in the last 168 hours. BNP (last 3 results) No results for input(s): PROBNP in the last 8760 hours. HbA1C: No results for input(s): HGBA1C in the last 72 hours. CBG: Recent Labs  Lab 03/09/20 1551 03/09/20 2149 03/10/20 0625 03/10/20 0756 03/10/20 1133  GLUCAP 214* 133* 137* 146* 137*   Lipid Profile: No results for input(s): CHOL, HDL, LDLCALC, TRIG, CHOLHDL,  LDLDIRECT in the last 72 hours. Thyroid Function Tests: No results for input(s): TSH, T4TOTAL, FREET4, T3FREE, THYROIDAB in the last 72 hours. Anemia Panel: No results for input(s): VITAMINB12, FOLATE, FERRITIN, TIBC, IRON, RETICCTPCT in the last 72 hours.    Radiology Studies: I have reviewed all of the imaging during this hospital visit personally     Scheduled Meds: . [START ON 03/11/2020] atorvastatin  20 mg Oral Daily  . Chlorhexidine Gluconate Cloth  6 each Topical Daily  . clopidogrel  75 mg Oral Daily  . diltiazem  60 mg Oral Q6H  . docusate sodium  100 mg Oral Daily  . insulin aspart  0-15 Units Subcutaneous TID WC  . pantoprazole (PROTONIX) IV  40 mg Intravenous Daily  . umeclidinium bromide  1 puff Inhalation Daily   Continuous Infusions: . sodium chloride Stopped (03/08/20 1008)  . bivalirudin (ANGIOMAX) infusion 0.5 mg/mL (Non-ACS indications) 0.095 mg/kg/hr (03/10/20 1200)  . sodium chloride       LOS: 4 days        Adaria Hole Gerome Apley, MD

## 2020-03-10 NOTE — Progress Notes (Addendum)
  Critical Care Note    03/10/2020 8:01 AM 4 Days Post-Op  Subjective:  Left foot hurts   Vitals:   03/10/20 0600 03/10/20 0700  BP:  103/64  Pulse: 77 78  Resp: (!) 22 19  Temp:  98.2 F (36.8 C)  SpO2: 315% 17%   Systolic BP: 61-607P>>XTGG controlled a. Fib on cardizem HR: 70-90s UOP: 800 cc  Physical Exam: Cardiac:  RRR Lungs:  CTAB Incisions:  Right lower leg dressing dry Extremities:  Intact motor and sensation bil. + Doppler signals x 3 bilaterally Abdomen:  soft  CBC    Component Value Date/Time   WBC 11.5 (H) 03/10/2020 0234   RBC 2.90 (L) 03/10/2020 0234   HGB 8.2 (L) 03/10/2020 0234   HCT 25.9 (L) 03/10/2020 0234   PLT 69 (L) 03/10/2020 0234   MCV 89.3 03/10/2020 0234   MCH 28.3 03/10/2020 0234   MCHC 31.7 03/10/2020 0234   RDW 14.0 03/10/2020 0234   LYMPHSABS 2.1 03/06/2020 1417   MONOABS 0.6 03/06/2020 1417   EOSABS 0.1 03/06/2020 1417   BASOSABS 0.0 03/06/2020 1417    BMET    Component Value Date/Time   NA 134 (L) 03/10/2020 0234   K 4.3 03/10/2020 0234   CL 100 03/10/2020 0234   CO2 27 03/10/2020 0234   GLUCOSE 161 (H) 03/10/2020 0234   BUN 12 03/10/2020 0234   CREATININE 0.95 03/10/2020 0234   CALCIUM 8.8 (L) 03/10/2020 0234   GFRNONAA >60 03/10/2020 0234   GFRAA >60 03/10/2020 0234     Intake/Output Summary (Last 24 hours) at 03/10/2020 0801 Last data filed at 03/10/2020 0700 Gross per 24 hour  Intake 494.87 ml  Output 800 ml  Net -305.13 ml    HOSPITAL MEDICATIONS Scheduled Meds: . Chlorhexidine Gluconate Cloth  6 each Topical Daily  . clopidogrel  75 mg Oral Daily  . diltiazem  60 mg Oral Q6H  . docusate sodium  100 mg Oral Daily  . insulin aspart  0-15 Units Subcutaneous TID WC  . pantoprazole (PROTONIX) IV  40 mg Intravenous Daily  . umeclidinium bromide  1 puff Inhalation Daily   Continuous Infusions: . sodium chloride Stopped (03/08/20 1008)  . bivalirudin (ANGIOMAX) infusion 0.5 mg/mL (Non-ACS indications) 0.095  mg/kg/hr (03/10/20 0700)  . sodium chloride     PRN Meds:.sodium chloride, acetaminophen **OR** acetaminophen, albuterol, alum & mag hydroxide-simeth, guaiFENesin-dextromethorphan, metoprolol tartrate, morphine injection, ondansetron, oxyCODONE, phenol, polyethylene glycol, potassium chloride, white petrolatum  Systems Assessment: Hemodynamics: not requiring vasopressors.  Pulmonary: Nonlabored, SaO2 98% on room air Renal: Adequate urine output currently.  Normal serum creatinine Vascular: Lower extremities well perfused with intact motor function and sensation ID: Remains afebrile.  White blood count11.5 Nutrition: Poor appetite.  Advance as tolerated Hematologic: Acute blood loss anemia status post 3 unit packed cells.  Trend is upwardContinue to monitor  Plan: -RLE fasciotomy closure tomorrow. Continue efforts at mobilization -DVT prophylaxis:  On Angiomax   Risa Grill, PA-C [facility] [phone] 03/10/2020  8:01 AM  I have independently interviewed and examined patient and agree with PA assessment and plan above. [place] for diet today and will plan possible fasciotomy closure tomorrow.   Jayveon Convey C. Donzetta Matters, MD [facility] Office: [phone] Pager: [phone]

## 2020-03-10 NOTE — Evaluation (Signed)
Occupational Therapy Evaluation Patient Details Name: [name] MRN: [id number] DOB: 1945/10/13 Today's Date: 03/10/2020    History of Present Illness 74 year old male with hx AAA s/p repair in 12/2019 with endovascular stent graft s/p left iliac thrombectomy and left external iliac stent, PVD, HTN, DM2, CAD, hx post-op atrial fibrillation who presented with R hip pain and leg parasthesia.On 7/15, he underwent emergent thrombectomy of right and left limb of aorto biiliac endograft and faciotomy R LE for acute on chronic limb ischemia.  Intra-op TEE demonstrated mobile left atrial thrombus, but managed non-operative.   Clinical Impression   This 74 yo male admitted and underwent above presents to acute OT with PLOF of Mod I with his basic ADLs. Currently he is unable to get up to EOB or back in bed by himself (max A), unable to stand from raised surface, has decreased EOB sitting balance all affecting his safety and independence with his basic ADLs (currently setup/S- total A). He will benefit from acute OT with follow up on CIR to get back to his PLOF (Mod I)    Follow Up Recommendations  CIR;Supervision/Assistance - 24 hour    Equipment Recommendations  Other (comment) (TBD next venue)    Recommendations for Other Services Rehab consult     Precautions / Restrictions Precautions Precautions: Fall Precaution Comments: R LE fasciotomy, possibly closure 03/11/20 Restrictions Weight Bearing Restrictions: No RLE Weight Bearing: Weight bearing as tolerated      Mobility Bed Mobility Overal bed mobility: Needs Assistance Bed Mobility: Supine to Sit;Sit to Supine     Supine to sit: Max assist;HOB elevated Sit to supine: Max assist   General bed mobility comments: assist for R leg off bed and to lift trunk in and OOB  Transfers                 General transfer comment: Attempted to stand x4 from raised bed, pt could not get clearance off of bed    Balance Overall balance  assessment: Needs assistance Sitting-balance support: Bilateral upper extremity supported;Feet supported Sitting balance-Leahy Scale: Poor Sitting balance - Comments: posterior lean that he would self correct 75% of time otherwise he needed VCs and occassional A Postural control: Posterior lean                                 ADL either performed or assessed with clinical judgement   ADL Overall ADL's : Needs assistance/impaired Eating/Feeding: Set up;Sitting;Supervision/ safety Eating/Feeding Details (indicate cue type and reason): EOB Grooming: Set up;Supervision/safety;Sitting Grooming Details (indicate cue type and reason): EOB Upper Body Bathing: Minimal assistance;Sitting Upper Body Bathing Details (indicate cue type and reason): EOB Lower Body Bathing: Total assistance;Bed level   Upper Body Dressing : Minimal assistance;Sitting Upper Body Dressing Details (indicate cue type and reason): EOB Lower Body Dressing: Total assistance;Bed level                       Vision Baseline Vision/History: Wears glasses Wears Glasses: At all times Patient Visual Report: No change from baseline              Pertinent Vitals/Pain Pain Assessment: Faces Faces Pain Scale: Hurts even more Pain Location: RLE with movement and attempt at [facility] Pain Descriptors / Indicators: Sore;Sharp Pain Intervention(s): Limited activity within patient's tolerance;Monitored during session;Repositioned     Hand Dominance Right   Extremity/Trunk Assessment Upper Extremity Assessment Upper  Extremity Assessment: Generalized weakness           Communication Communication Communication: No difficulties   Cognition Arousal/Alertness: Awake/alert Behavior During Therapy: WFL for tasks assessed/performed Overall Cognitive Status: Within Functional Limits for tasks assessed                                                Home Living Family/patient expects  to be discharged to:: Inpatient rehab Living Arrangements: Spouse/significant other Available Help at Discharge: Family;Available 24 hours/day Type of Home: House Home Access: Stairs to enter [employer] of Steps: 3 Entrance Stairs-Rails: None Home Layout: One level     Bathroom Shower/Tub: Tub/shower unit         Home Equipment: Environmental consultant - 2 wheels;Cane - single point;Shower seat;Grab bars - tub/shower          Prior Functioning/Environment Level of Independence: Needs assistance  Gait / Transfers Assistance Needed: modified indpendent with cane vs. walker in home. Activity decr in past weeks. ADL's / Homemaking Assistance Needed: independent from a [facility] level            OT Problem List: Decreased strength;Decreased range of motion;Impaired balance (sitting and/or standing);Decreased activity tolerance;Pain      OT Treatment/Interventions: Self-care/ADL training;DME and/or AE instruction;Patient/family education;Balance training;Therapeutic activities    OT Goals(Current goals can be found in the care plan section) Acute Rehab OT Goals Patient Stated Goal: to go to inpatient rehab and then home OT Goal Formulation: With patient/family Time For Goal Achievement: 03/24/20 Potential to Achieve Goals: Good  OT Frequency: Min 2X/week              AM-PAC OT "6 Clicks" Daily Activity     Outcome Measure Help from another person eating meals?: A Little Help from another person taking care of personal grooming?: A Little Help from another person toileting, which includes using toliet, bedpan, or urinal?: Total Help from another person bathing (including washing, rinsing, drying)?: A Little Help from another person to put on and taking off regular upper body clothing?: A Little Help from another person to put on and taking off regular lower body clothing?: Total 6 Click Score: 14   End of Session Equipment Utilized During Treatment: Gait belt;Rolling  walker  Activity Tolerance: Patient limited by pain Patient left: in bed;with call bell/phone within reach;with bed alarm set;with family/visitor present  OT Visit Diagnosis: Other abnormalities of gait and mobility (R26.89);Muscle weakness (generalized) (M62.81);Pain Pain - Right/Left: Right Pain - part of body: Leg                Time: 1660-6301 OT Time Calculation (min): 32 min Charges:  OT General Charges $OT Visit: 1 Visit OT Evaluation $OT Eval Moderate Complexity: 1 Mod OT Treatments $Self Care/Home Management : 8-22 mins  [place], OTR/L Acute [employer] Pager [phone] Office [phone]     Almon Register 03/10/2020, 5:58 PM

## 2020-03-10 NOTE — Progress Notes (Signed)
Pt noted going back into rate-controlled Afib around 2230. PO cardizem given at 2330--no change to rhythm. [place] notified. Since pt is rate controlled, VSS, and is already on anticoagulation, no new orders were made.

## 2020-03-10 NOTE — Consult Note (Signed)
WOC Nurse Consult Note: Reason for Consult: Right LE wounds, medial and lateral.  For closure in OR today Wound type: Surgical Pressure Injury POA: N/A Measurement: Medial measures 19cm x 4.5cm x 0.4cm Lateral measures 21cm x 5cm x 0.4cm Wound bed: Red, moist Drainage (amount, consistency, odor) serosanguinous Periwound: intact Dressing procedure/placement/frequency: For OR closure today.  Covered wounds with white petrolatum dressings, dry 4x4s, ABDs and secured with Kerlix roll gauze, ACE.  WOC nursing team will not follow, but will remain available to this patient, the nursing and medical teams.  Please re-consult if needed. Thanks, Maudie Flakes, MSN, RN, [place], Arther Abbott  Pager# [phone]

## 2020-03-11 ENCOUNTER — Encounter (HOSPITAL_COMMUNITY)
Admission: EM | Disposition: A | Discharge status: Skilled Nursing Facility | Payer: Self-pay | Source: Home / Self Care | Attending: Internal Medicine

## 2020-03-11 ENCOUNTER — Inpatient Hospital Stay (HOSPITAL_COMMUNITY): Payer: Medicare PPO | Admitting: Anesthesiology

## 2020-03-11 ENCOUNTER — Inpatient Hospital Stay (HOSPITAL_COMMUNITY): Payer: Medicare PPO

## 2020-03-11 ENCOUNTER — Encounter (HOSPITAL_COMMUNITY): Payer: Self-pay | Admitting: Pulmonary Disease

## 2020-03-11 DIAGNOSIS — I480 Paroxysmal atrial fibrillation: Secondary | ICD-10-CM | POA: Diagnosis not present

## 2020-03-11 DIAGNOSIS — I714 Abdominal aortic aneurysm, without rupture: Secondary | ICD-10-CM | POA: Diagnosis not present

## 2020-03-11 DIAGNOSIS — I739 Peripheral vascular disease, unspecified: Secondary | ICD-10-CM

## 2020-03-11 DIAGNOSIS — I513 Intracardiac thrombosis, not elsewhere classified: Secondary | ICD-10-CM

## 2020-03-11 DIAGNOSIS — I2510 Atherosclerotic heart disease of native coronary artery without angina pectoris: Secondary | ICD-10-CM | POA: Diagnosis not present

## 2020-03-11 DIAGNOSIS — I998 Other disorder of circulatory system: Secondary | ICD-10-CM | POA: Diagnosis not present

## 2020-03-11 DIAGNOSIS — T82898A Other specified complication of vascular prosthetic devices, implants and grafts, initial encounter: Secondary | ICD-10-CM

## 2020-03-11 DIAGNOSIS — I700 Atherosclerosis of aorta: Secondary | ICD-10-CM | POA: Diagnosis not present

## 2020-03-11 DIAGNOSIS — M79604 Pain in right leg: Secondary | ICD-10-CM | POA: Diagnosis not present

## 2020-03-11 DIAGNOSIS — I236 Thrombosis of atrium, auricular appendage, and ventricle as current complications following acute myocardial infarction: Secondary | ICD-10-CM | POA: Diagnosis not present

## 2020-03-11 HISTORY — PX: TEE WITHOUT CARDIOVERSION: SHX5443

## 2020-03-11 HISTORY — PX: FASCIOTOMY CLOSURE: SHX5829

## 2020-03-11 LAB — HEPATIC FUNCTION PANEL
ALT: 22 U/L (ref 0–44)
AST: 38 U/L (ref 15–41)
Albumin: 2.9 g/dL — ABNORMAL LOW (ref 3.5–5.0)
Alkaline Phosphatase: 50 U/L (ref 38–126)
Bilirubin, Direct: 0.2 mg/dL (ref 0.0–0.2)
Indirect Bilirubin: 1.6 mg/dL — ABNORMAL HIGH (ref 0.3–0.9)
Total Bilirubin: 1.8 mg/dL — ABNORMAL HIGH (ref 0.3–1.2)
Total Protein: 5.9 g/dL — ABNORMAL LOW (ref 6.5–8.1)

## 2020-03-11 LAB — BASIC METABOLIC PANEL
Anion gap: 9 (ref 5–15)
BUN: 11 mg/dL (ref 8–23)
CO2: 24 mmol/L (ref 22–32)
Calcium: 8.8 mg/dL — ABNORMAL LOW (ref 8.9–10.3)
Chloride: 102 mmol/L (ref 98–111)
Creatinine, Ser: 0.93 mg/dL (ref 0.61–1.24)
GFR calc Af Amer: >60 mL/min (ref >60)
GFR calc non Af Amer: >60 mL/min (ref >60)
Glucose, Bld: 158 mg/dL — ABNORMAL HIGH (ref 70–99)
Potassium: 3.7 mmol/L (ref 3.5–5.1)
Sodium: 135 mmol/L (ref 135–145)

## 2020-03-11 LAB — GLUCOSE, CAPILLARY
Glucose-Capillary: 151 mg/dL — ABNORMAL HIGH (ref 70–99)
Glucose-Capillary: 141 mg/dL — ABNORMAL HIGH (ref 70–99)
Glucose-Capillary: 118 mg/dL — ABNORMAL HIGH (ref 70–99)
Glucose-Capillary: 127 mg/dL — ABNORMAL HIGH (ref 70–99)
Glucose-Capillary: 149 mg/dL — ABNORMAL HIGH (ref 70–99)
Glucose-Capillary: 129 mg/dL — ABNORMAL HIGH (ref 70–99)

## 2020-03-11 LAB — ECHO INTRAOPERATIVE TEE
AV Mean grad: 4 mmHg
AV Peak grad: 8.2 mmHg
Ao pk vel: 1.43 m/s
Height: 75 in
S' Lateral: 3.03 cm
Weight: 3104.08 oz

## 2020-03-11 LAB — APTT
aPTT: 62 seconds — ABNORMAL HIGH (ref 24–36)
aPTT: 62 seconds — ABNORMAL HIGH (ref 24–36)

## 2020-03-11 LAB — CBC
HCT: 22.7 % — ABNORMAL LOW (ref 39.0–52.0)
Hemoglobin: 7.2 g/dL — ABNORMAL LOW (ref 13.0–17.0)
MCH: 27.9 pg (ref 26.0–34.0)
MCHC: 31.7 g/dL (ref 30.0–36.0)
MCV: 88 fL (ref 80.0–100.0)
Platelets: 100 10*3/uL — ABNORMAL LOW (ref 150–400)
RBC: 2.58 MIL/uL — ABNORMAL LOW (ref 4.22–5.81)
RDW: 13.8 % (ref 11.5–15.5)
WBC: 8 10*3/uL (ref 4.0–10.5)
nRBC: 0 % (ref 0.0–0.2)

## 2020-03-11 LAB — MAGNESIUM: Magnesium: 2 mg/dL (ref 1.7–2.4)

## 2020-03-11 LAB — PREPARE RBC (CROSSMATCH)

## 2020-03-11 LAB — PHOSPHORUS: Phosphorus: 2.7 mg/dL (ref 2.5–4.6)

## 2020-03-11 SURGERY — FASCIOTOMY CLOSURE
Anesthesia: General | Site: Leg Lower | Laterality: Right

## 2020-03-11 MED ORDER — SODIUM CHLORIDE 0.9 % IV SOLN
10.0000 mL/h | Freq: Once | INTRAVENOUS | Status: AC
  Administered 2020-03-11: 10 mL/h via INTRAVENOUS

## 2020-03-11 MED ORDER — PROPOFOL 10 MG/ML IV BOLUS
INTRAVENOUS | Status: AC
  Filled 2020-03-11: qty 20

## 2020-03-11 MED ORDER — SUGAMMADEX SODIUM 200 MG/2ML IV SOLN
INTRAVENOUS | Status: DC | PRN
Start: 2020-03-11 — End: 2020-03-11
  Administered 2020-03-11: 400 mg via INTRAVENOUS

## 2020-03-11 MED ORDER — PROMETHAZINE HCL 25 MG/ML IJ SOLN: 6.2500 mg | INTRAMUSCULAR | Status: DC | PRN

## 2020-03-11 MED ORDER — CEFAZOLIN SODIUM-DEXTROSE 2-3 GM-%(50ML) IV SOLR
INTRAVENOUS | Status: DC | PRN
  Administered 2020-03-11: 2 g via INTRAVENOUS

## 2020-03-11 MED ORDER — LACTATED RINGERS IV SOLN: INTRAVENOUS | Status: DC

## 2020-03-11 MED ORDER — CHLORHEXIDINE GLUCONATE 0.12 % MT SOLN
15.0000 mL | Freq: Once | OROMUCOSAL | Status: AC
  Administered 2020-03-12: 15 mL via OROMUCOSAL
  Filled 2020-03-11: qty 15

## 2020-03-11 MED ORDER — MIDAZOLAM HCL 5 MG/5ML IJ SOLN
INTRAMUSCULAR | Status: DC | PRN
  Administered 2020-03-11: 2 mg via INTRAVENOUS

## 2020-03-11 MED ORDER — PROPOFOL 10 MG/ML IV BOLUS
INTRAVENOUS | Status: DC | PRN
  Administered 2020-03-11: 100 mg via INTRAVENOUS

## 2020-03-11 MED ORDER — MIDAZOLAM HCL 2 MG/2ML IJ SOLN
INTRAMUSCULAR | Status: AC
  Filled 2020-03-11: qty 2

## 2020-03-11 MED ORDER — POTASSIUM CHLORIDE CRYS ER 20 MEQ PO TBCR
40.0000 meq | EXTENDED_RELEASE_TABLET | Freq: Once | ORAL | Status: AC
  Administered 2020-03-11: 40 meq via ORAL
  Filled 2020-03-11: qty 2

## 2020-03-11 MED ORDER — PHENYLEPHRINE HCL (PRESSORS) 10 MG/ML IV SOLN
INTRAVENOUS | Status: DC | PRN
  Administered 2020-03-11: 120 ug via INTRAVENOUS

## 2020-03-11 MED ORDER — FENTANYL CITRATE (PF) 250 MCG/5ML IJ SOLN
INTRAMUSCULAR | Status: AC
  Filled 2020-03-11: qty 5

## 2020-03-11 MED ORDER — CALCIUM CHLORIDE 10 % IV SOLN
INTRAVENOUS | Status: DC | PRN
  Administered 2020-03-11: 1 g via INTRAVENOUS

## 2020-03-11 MED ORDER — ORAL CARE MOUTH RINSE: 15.0000 mL | Freq: Once | OROMUCOSAL | Status: AC

## 2020-03-11 MED ORDER — LIDOCAINE 2% (20 MG/ML) 5 ML SYRINGE
INTRAMUSCULAR | Status: DC | PRN
  Administered 2020-03-11: 40 mg via INTRAVENOUS

## 2020-03-11 MED ORDER — ONDANSETRON HCL 4 MG/2ML IJ SOLN
INTRAMUSCULAR | Status: DC | PRN
  Administered 2020-03-11: 4 mg via INTRAVENOUS

## 2020-03-11 MED ORDER — LACTATED RINGERS IV SOLN: INTRAVENOUS | Status: DC | PRN

## 2020-03-11 MED ORDER — ALBUMIN HUMAN 5 % IV SOLN: INTRAVENOUS | Status: DC | PRN

## 2020-03-11 MED ORDER — MIDAZOLAM HCL 2 MG/2ML IJ SOLN: 0.5000 mg | Freq: Once | INTRAMUSCULAR | Status: DC | PRN

## 2020-03-11 MED ORDER — DILTIAZEM HCL 60 MG PO TABS
90.0000 mg | ORAL_TABLET | Freq: Four times a day (QID) | ORAL | Status: DC
  Administered 2020-03-11 – 2020-03-19 (×31): 90 mg via ORAL
  Filled 2020-03-11 (×31): qty 2

## 2020-03-11 MED ORDER — FENTANYL CITRATE (PF) 100 MCG/2ML IJ SOLN: 25.0000 ug | INTRAMUSCULAR | Status: DC | PRN

## 2020-03-11 MED ORDER — FENTANYL CITRATE (PF) 100 MCG/2ML IJ SOLN
INTRAMUSCULAR | Status: DC | PRN
  Administered 2020-03-11: 100 ug via INTRAVENOUS

## 2020-03-11 MED ORDER — ROCURONIUM BROMIDE 10 MG/ML (PF) SYRINGE
PREFILLED_SYRINGE | INTRAVENOUS | Status: AC
  Filled 2020-03-11: qty 10

## 2020-03-11 MED ORDER — FENTANYL CITRATE (PF) 100 MCG/2ML IJ SOLN
INTRAMUSCULAR | Status: AC
  Filled 2020-03-11: qty 2

## 2020-03-11 MED ORDER — OXYCODONE HCL 5 MG PO TABS: 5.0000 mg | ORAL_TABLET | Freq: Once | ORAL | Status: DC | PRN

## 2020-03-11 MED ORDER — PHENYLEPHRINE HCL-NACL 10-0.9 MG/250ML-% IV SOLN
INTRAVENOUS | Status: DC | PRN
  Administered 2020-03-11: 50 ug/min via INTRAVENOUS

## 2020-03-11 MED ORDER — SODIUM CHLORIDE 0.9% IV SOLUTION
Freq: Once | INTRAVENOUS | Status: AC
  Administered 2020-03-11: 250 mL via INTRAVENOUS

## 2020-03-11 MED ORDER — MEPERIDINE HCL 25 MG/ML IJ SOLN: 6.2500 mg | INTRAMUSCULAR | Status: DC | PRN

## 2020-03-11 MED ORDER — DILTIAZEM HCL 60 MG PO TABS
30.0000 mg | ORAL_TABLET | Freq: Once | ORAL | Status: AC
  Administered 2020-03-11: 30 mg via ORAL
  Filled 2020-03-11: qty 1

## 2020-03-11 MED ORDER — 0.9 % SODIUM CHLORIDE (POUR BTL) OPTIME
TOPICAL | Status: DC | PRN
  Administered 2020-03-11: 1000 mL

## 2020-03-11 MED ORDER — OXYCODONE HCL 5 MG/5ML PO SOLN: 5.0000 mg | Freq: Once | ORAL | Status: DC | PRN

## 2020-03-11 SURGICAL SUPPLY — 36 items
BAG ISL DRAPE 18X18 STRL (DRAPES)
BAG ISOLATION DRAPE 18X18 (DRAPES) IMPLANT
BNDG ELASTIC 4X5.8 VLCR STR LF (GAUZE/BANDAGES/DRESSINGS) ×4 IMPLANT
BNDG ELASTIC 6X5.8 VLCR STR LF (GAUZE/BANDAGES/DRESSINGS) IMPLANT
BNDG GAUZE ELAST 4 BULKY (GAUZE/BANDAGES/DRESSINGS) ×4 IMPLANT
CANISTER SUCT 3000ML PPV (MISCELLANEOUS) ×4 IMPLANT
COVER WAND RF STERILE (DRAPES) IMPLANT
DRAPE EXTREMITY BILATERAL (DRAPES) IMPLANT
DRAPE HALF SHEET 40X57 (DRAPES) IMPLANT
DRAPE ISOLATION BAG 18X18 (DRAPES)
DRAPE U-SHAPE 76X120 STRL (DRAPES) ×8 IMPLANT
DRSG PAD ABDOMINAL 8X10 ST (GAUZE/BANDAGES/DRESSINGS) ×4 IMPLANT
DRSG XEROFORM 1X8 (GAUZE/BANDAGES/DRESSINGS) ×4 IMPLANT
ELECT REM PT RETURN 9FT ADLT (ELECTROSURGICAL) ×4
ELECTRODE REM PT RTRN 9FT ADLT (ELECTROSURGICAL) ×2 IMPLANT
GAUZE SPONGE 4X4 12PLY STRL (GAUZE/BANDAGES/DRESSINGS) ×4 IMPLANT
GAUZE XEROFORM 5X9 LF (GAUZE/BANDAGES/DRESSINGS) IMPLANT
GLOVE BIO SURGEON STRL SZ7.5 (GLOVE) ×4 IMPLANT
GOWN STRL REUS W/ TWL LRG LVL3 (GOWN DISPOSABLE) ×4 IMPLANT
GOWN STRL REUS W/ TWL XL LVL3 (GOWN DISPOSABLE) ×2 IMPLANT
GOWN STRL REUS W/TWL LRG LVL3 (GOWN DISPOSABLE) ×8
GOWN STRL REUS W/TWL XL LVL3 (GOWN DISPOSABLE) ×4
KIT BASIN OR (CUSTOM PROCEDURE TRAY) ×4 IMPLANT
KIT TURNOVER KIT B (KITS) ×4 IMPLANT
NS IRRIG 1000ML POUR BTL (IV SOLUTION) ×4 IMPLANT
PACK CV ACCESS (CUSTOM PROCEDURE TRAY) IMPLANT
PACK GENERAL/GYN (CUSTOM PROCEDURE TRAY) ×4 IMPLANT
PACK UNIVERSAL I (CUSTOM PROCEDURE TRAY) IMPLANT
PAD ARMBOARD 7.5X6 YLW CONV (MISCELLANEOUS) ×8 IMPLANT
SUT ETHILON 3 0 PS 1 (SUTURE) IMPLANT
SUT VIC AB 2-0 CTX 36 (SUTURE) IMPLANT
SUT VIC AB 3-0 SH 27 (SUTURE)
SUT VIC AB 3-0 SH 27X BRD (SUTURE) IMPLANT
SUT VICRYL 4-0 PS2 18IN ABS (SUTURE) IMPLANT
TOWEL GREEN STERILE (TOWEL DISPOSABLE) ×4 IMPLANT
WATER STERILE IRR 1000ML POUR (IV SOLUTION) ×4 IMPLANT

## 2020-03-11 NOTE — Progress Notes (Signed)
  Echocardiogram Echocardiogram Transesophageal has been performed.  [name] 03/11/2020, 10:02 AM

## 2020-03-11 NOTE — Anesthesia Postprocedure Evaluation (Signed)
Anesthesia Post Note  Patient: [name]  Procedure(s) Performed: RIGHT LOWER EXTREMITY FASCIOTOMY WASHOUT AND CLOSURE (Right Leg Lower) TRANSESOPHAGEAL ECHOCARDIOGRAM (TEE)     Patient location during evaluation: PACU Anesthesia Type: General Level of consciousness: awake and alert, oriented and patient cooperative Pain management: pain level controlled Vital Signs Assessment: post-procedure vital signs reviewed and stable Respiratory status: spontaneous breathing, nonlabored ventilation, respiratory function stable and patient connected to nasal cannula oxygen Cardiovascular status: blood pressure returned to baseline and stable Postop Assessment: no apparent nausea or vomiting Anesthetic complications: no   No complications documented.  Last Vitals:  Vitals:   03/11/20 1221 03/11/20 1300  BP:  110/79  Pulse:  (!) 102  Resp:  (!) 22  Temp: 36.8 C   SpO2:  96%    Last Pain:  Vitals:   03/11/20 1244  TempSrc:   PainSc: 2                  Fields Oros,E. Marrietta Thunder

## 2020-03-11 NOTE — Progress Notes (Signed)
Patient received from PACU apporoximately 1100 in afib rhythm and heart rate in the low 120's. PO diltiazem administered, Dr. Donzetta Matters paged and made aware. CCM has signed off on patient. Further issues will be referred to the hospitalist.

## 2020-03-11 NOTE — Progress Notes (Signed)
   I interviewed and examined patient in his room with his wife present.  He has good signals bilateral feet does have pain in the right lower extremity compartments are soft status post closure dressings clean dry intact.  Plan will be possible surgical evacuation of thrombus with Dr. Prescott Gum next week.  [name]

## 2020-03-11 NOTE — Progress Notes (Signed)
[place] for Bivalirudin  Indication: LA thrombus, occluded aortic stent graft, s/p OR   Allergies  Allergen Reactions  . Erythromycin Shortness Of Breath and Swelling  . Simvastatin Other (See Comments)    CAUSE MUSCLE PAIN,.  . Statins Other (See Comments)    Muscle pain    Patient Measurements: Height: 6\' 3"  (190.5 cm) Weight: 88 kg (194 lb 0.1 oz) IBW/kg (Calculated) : 84.5 HEPARIN DW (KG): 79 kg  Vital Signs: Temp: 97.9 F (36.6 C) (07/20 1025) Temp Source: Oral (07/20 0319) BP: 109/81 (07/20 1102) Pulse Rate: 112 (07/20 1025)  Labs: Recent Labs    03/09/20 0301 03/09/20 0301 03/09/20 0921 03/10/20 0234 03/10/20 0623 03/11/20 0318  HGB 7.9*   < >  --  8.2*  --  7.2*  HCT 24.6*  --   --  25.9*  --  22.7*  PLT 49*  --   --  69*  --  100*  APTT 38*   < > 63* 63*  --  62*  CREATININE 0.99  --   --  0.95  --  0.93  TROPONINIHS  --   --   --   --  13  --    < > = values in this interval not displayed.    Estimated Creatinine Clearance: 84.6 mL/min (by C-G formula based on SCr of 0.93 mg/dL).   Assessment:  74 y.o. 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 Patient was taken to OR for thrombectomy and intraop TEE revealed LA thrombus. Pharmacy initially consulted for IV heparin dosing, but was changed to bivalirudin for possible HIT. 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  -s/p fasciotomy closure today -aPTT at goal -hg= 7.2, plt= 100    Goal of Therapy:  aPTT 50-85 secs Monitor platelets by anticoagulation protocol: Yes  Plan:  Continue bivalirudin to 0.095 mg/kg/hr Recheck an aPTT later today Monitor daily aPTT, CBC, s/sx bleeding F/u HIT SRA results  Please check AMION for all [place]  numbers 03/11/2020

## 2020-03-11 NOTE — Progress Notes (Signed)
Day of Surgery Procedure(s) (LRB): RIGHT LOWER EXTREMITY FASCIOTOMY WASHOUT AND CLOSURE (Right) TRANSESOPHAGEAL ECHOCARDIOGRAM (TEE) Subjective: RLE fasciotomy incisions closed in OR and repeat TEE shows no sign of lysis of large LA thrombus despite 6 days of bival He would benefit from sternotomy, cardiopulmonry bypass and resection of LA thrombus when he is ambulatory , has his coronaries cleared with cardiac CT FFR, and has a negative SRA to eliminate HIT as etiology of the thrombus as heparin will be needed for cardiopulmonary bypass   Objective: Vital signs in last 24 hours: Temp:  [97.9 F (36.6 C)-99.4 F (37.4 C)] 99.4 F (37.4 C) (07/20 1459) Pulse Rate:  [71-132] 77 (07/20 1500) Cardiac Rhythm: Atrial fibrillation (07/20 1025) Resp:  [14-29] 24 (07/20 1500) BP: (91-155)/(52-95) 138/86 (07/20 1500) SpO2:  [95 %-100 %] 99 % (07/20 1500) Weight:  [88 kg] 88 kg (07/20 0510)  Hemodynamic parameters for last 24 hours:    Intake/Output from previous day: 07/19 0701 - 07/20 0700 In: 445.2 [P.O.:50; I.V.:395.2] Out: 1800 [Urine:1800] Intake/Output this shift: Total I/O In: 1318.9 [I.V.:1018.9; IV Piggyback:300] Out: 551 [Urine:550; Blood:1]  nsr RLE wrapped Neuro intact  Lab Results: Recent Labs    03/10/20 0234 03/11/20 0318  WBC 11.5* 8.0  HGB 8.2* 7.2*  HCT 25.9* 22.7*  PLT 69* 100*   BMET:  Recent Labs    03/10/20 0234 03/11/20 0318  NA 134* 135  K 4.3 3.7  CL 100 102  CO2 27 24  GLUCOSE 161* 158*  BUN 12 11  CREATININE 0.95 0.93  CALCIUM 8.8* 8.8*    PT/INR: No results for input(s): LABPROT, INR in the last 72 hours. ABG    Component Value Date/Time   PHART 7.418 03/06/2020 1807   HCO3 23.2 03/06/2020 1807   TCO2 24 03/06/2020 1807   ACIDBASEDEF 1.0 03/06/2020 1807   O2SAT 99.0 03/06/2020 1807   CBG (last 3)  Recent Labs    03/11/20 1017 03/11/20 1150 03/11/20 1609  GLUCAP 118* 127* 149*    Assessment/Plan: S/P Procedure(s)  (LRB): RIGHT LOWER EXTREMITY FASCIOTOMY WASHOUT AND CLOSURE (Right) TRANSESOPHAGEAL ECHOCARDIOGRAM (TEE) Will follow for preparation of patient for surgery next week   LOS: 5 days    [name] 03/11/2020

## 2020-03-11 NOTE — Transfer of Care (Signed)
Immediate Anesthesia Transfer of Care Note  Patient: Findlay Dagher  Procedure(s) Performed: RIGHT LOWER EXTREMITY FASCIOTOMY WASHOUT AND CLOSURE (Right Leg Lower) TRANSESOPHAGEAL ECHOCARDIOGRAM (TEE)  Patient Location: PACU  Anesthesia Type:General  Level of Consciousness: awake, alert  and oriented  Airway & Oxygen Therapy: Patient Spontanous Breathing  Post-op Assessment: Report given to RN and Post -op Vital signs reviewed and stable  Post vital signs: Reviewed and stable  Last Vitals:  Vitals Value Taken Time  BP    Temp    Pulse 137 03/11/20 0952  Resp 21 03/11/20 0952  SpO2 99 % 03/11/20 0952  Vitals shown include unvalidated device data.  Last Pain:  Vitals:   03/11/20 0319  TempSrc: Oral  PainSc:       Patients Stated Pain Goal: 0 (81/10/31 5945)  Complications: No complications documented.

## 2020-03-11 NOTE — Progress Notes (Signed)
PROGRESS NOTE    Costa Jha  NIO:[id number] DOB: 1946/07/10 DOA: 03/06/2020 PCP: [facility]    Brief Narrative:  Patient was admitted to the hospital with acute bilateral occlusion of October iliac grafts, complicated by left atrial thrombus.  74 year old male with a past medical history for abdominal aortic aneurysm status post repair 12/2019 with endovascular stent, graft status post left iliac thrombectomy and left external iliac stent.  He also has peripheral vascular disease, hypertension, type 2 diabetes mellitus, coronary disease and recent postop atrial fibrillation. Patient reported right hip pain and leg paresthesias, for about 2 hours prior to hospitalization, it occurred while he was sitting on a commode. On his initial physical examination his blood pressure was 113/72, heart rate 67, temperature 98.4, respiratory rate 16, oxygen saturation 100%.  His lungs were clear to auscultation bilaterally, heart S1-S2, present rhythmic soft abdomen, he had palpable left femoral pulse but no palpable right femoral pulse.  Right foot was cool.  No motor sensation in the right leg below the inguinal crease.  Profoundly ischemic right leg. Sodium 139, potassium 3.7, chloride 106, bicarb 22, glucose 179, BUN 13, creatinine 1.0, lactic acid 3.3, white count 9.5, hemoglobin 10.9, hematocrit 35.8, platelets 51.  SARS COVID-19 negative. CT angiography of the lower extremities showed right limb of the bifurcated aortic stent graft occluded.  Left iliac artery stent patent. EKG 65 bpm, normal axis, normal intervals, sinus rhythm, no ST segment or T wave changes, poor R wave progression.  Patient underwent emergent thrombectomy of the right limb of aortobiiliac endograft, right lower extremity thrombectomy, thrombectomy of left limb of aortobiiliac endograft, left lower extremity thrombectomy.  Right lower extremity 4 compartment fasciotomies.  Intraoperatively transesophageal echocardiography  showed a mobile left atrial thrombus.   Patient was placed on Bivalirudin for anticoagulation. 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  Today patient underwent closure pf right leg fasciotomies today, during procedure patient developed atrial fibrillation with rapid ventricular response, controlled with AV blockade.   Assessment & Plan:   Principal Problem:   Vascular occlusion Active Problems:   DM (diabetes mellitus) ([place])   Essential hypertension   CAD (coronary artery disease)   Tobacco abuse   AAA (abdominal aortic aneurysm) without rupture (HCC)   Femoral-tibial bypass graft occlusion, right (HCC)   Aortic occlusion (HCC)   AF (paroxysmal atrial fibrillation) (HCC)   Left atrial thrombus   1. Acute bilateral occlusions of aortoiiliac grafts complicated with left atrial thrombus and thrombocytopenia/ acute anemia .  Patient sp bilateral lower extremities, thrombectomy and 4 compartment fasciotomies. HIT antibodies negative. Sp one unit PRBC on 07/15.  07/20 closure fasciotomies. Hg today is down to 7,2 with Hct is 22.7. Improved platelets up to 100.  Tolerating well anticoagulation with Bivalirudin. In the setting of worsening anemia and uncontrolled AFIb will order one unit PRBC today and continue close follow up on cell count.   Continue analgesia with IV morphine, and oral oxycodone.   2. Left atrial thrombus/ paroxysmal atrial fibrillation . Intraoperative TEE with LV systolic function 55 to 81% with mild LVH, preserved RV systolic function, positive large mobile lobulated mass in the LA, likely thrombus, 10 cm x 5 cm, the more distal part extends to the mitral valve during diastole, no significant inflow obstruction.  Continue anticoagulation with Bivalirudin with good toleration. For  uncontrolled atrial fibrillation  will increase diltiazem to 90 mg q6 and continue close telemetry monitoring.    2. COPD. Stable with no active signs of exacerbation. On as needed bronchodilator/ incruse ellipta therapy.  3. HTN. For now holding on antihypertensive therapy, blood pressure is controlled. Systolic blood pressure  546 mmHg today.   4. CAD/. No active chest pain, continue with clopidogrel and statin therapy (at home he was discontinue on statin due to muscle pain)   5. Uncontrolled T2DM, Hgb A1c 8,2/ dyslipidemia. Glucose cover and monitoring with insulin sliding scale. Patient tolerating po well. Fasting glucose this am 158 mg/dl.   On atorvastatin.   6. Hypokalemia. Stable renal function with serum cr at 0,93 with persistent low K down to 3,7 and serum bicarbonate 24. Mg is 2,0.   Will continue K correction with 40 meq Kcl, target K of 4, follow up on renal function in am.    Patient continue to be at high risk for worsening anemia, arrhythmias and embolic phenomena.   Status is: Inpatient  Remains inpatient appropriate because:IV treatments appropriate due to intensity of illness or inability to take PO   Dispo: The patient is from: Home              Anticipated d/c is to: Home              Anticipated d/c date is: 3 days              Patient currently is not medically stable to d/c.   DVT prophylaxis: bivalirudin   Code Status:   full  Family Communication:  I spoke with patient's wife at the bedside, we talked in detail about patient's condition, plan of care and prognosis and all questions were addressed.     Consultants:   Vascular surgery   CT surgery   Procedures:  07/15 Emergent thrombectomy of the right limb of aortobiiliac endograft, right lower extremity thrombectomy, thrombectomy of left limb of aortobiiliac endograft, left lower extremity thrombectomy.  Right lower extremity 4 compartment fasciotomies.  07/20 Fasciotomies  closure  Subjective: Patient with moderate pain at the right leg post procedure, controlled with analgesics, no nausea or vomiting, no dyspnea or chest pain, no palpitations.   Objective: Vitals:   03/11/20 1100 03/11/20 1102 03/11/20 1200 03/11/20 1221  BP: 109/81 109/81 121/79   Pulse: (!) 132  (!) 106   Resp: (!) 23  (!) 22   Temp:    98.3 F (36.8 C)  TempSrc:      SpO2: 98%  99%   Weight:      Height:        Intake/Output Summary (Last 24 hours) at 03/11/2020 1237 Last data filed at 03/11/2020 1025 Gross per 24 hour  Intake 1427.24 ml  Output 1751 ml  Net -323.76 ml   Filed Weights   03/09/20 0500 03/10/20 0500 03/11/20 0510  Weight: 92.9 kg 90.4 kg 88 kg    Examination:   General: deconditioned and ill looking appearing  Neurology: Awake and alert, non focal  E ENT: positive  pallor, no icterus, oral mucosa moist Cardiovascular: No JVD. S1-S2 present, irregularly irregular, no gallops, rubs, or murmurs. No lower extremity edema. Pulmonary: positive breath sounds bilaterally, adequate air movement, no wheezing, rhonchi or rales. Gastrointestinal. Abdomen soft and non tender Skin. Right leg with dressing in place.  Musculoskeletal: no joint deformities     Data Reviewed: I have personally reviewed following labs and imaging studies  CBC: Recent Labs  Lab  03/06/20 1417 03/06/20 1432 03/07/20 1700 03/08/20 0415 03/09/20 0301 03/10/20 0234 03/11/20 0318  WBC 9.5   < > 10.3 9.5 11.2* 11.5* 8.0  NEUTROABS 6.6  --   --   --   --   --   --   HGB 10.9*   < > 6.7* 7.9* 7.9* 8.2* 7.2*  HCT 35.8*   < > 21.0* 24.3* 24.6* 25.9* 22.7*  MCV 89.7   < > 85.4 86.2 88.5 89.3 88.0  PLT 51*   < > 37* 34* 49* 69* 100*   < > = values in this interval not displayed.   Basic Metabolic Panel: Recent Labs  Lab 03/07/20 0459 03/07/20 0459 03/07/20 1700 03/08/20 0415 03/09/20 0301 03/10/20 0234 03/11/20 0318  NA 138   < > 137 139 136 134* 135  K 4.2   < > 3.5 3.8 3.9  4.3 3.7  CL 107   < > 107 107 103 100 102  CO2 20*   < > 21* 24 23 27 24   GLUCOSE 207*   < > 133* 162* 139* 161* 158*  BUN 13   < > 13 10 9 12 11   CREATININE 1.06   < > 1.00 0.98 0.99 0.95 0.93  CALCIUM 8.4*   < > 8.6* 8.7* 9.0 8.8* 8.8*  MG 1.6*   < > 2.0 1.8 2.0 1.9 2.0  PHOS 3.1  --   --   --   --  2.7 2.7   < > = values in this interval not displayed.   GFR: Estimated Creatinine Clearance: 84.6 mL/min (by C-G formula based on SCr of 0.93 mg/dL). Liver Function Tests: Recent Labs  Lab 03/11/20 0318  AST 38  ALT 22  ALKPHOS 50  BILITOT 1.8*  PROT 5.9*  ALBUMIN 2.9*   No results for input(s): LIPASE, AMYLASE in the last 168 hours. No results for input(s): AMMONIA in the last 168 hours. Coagulation Profile: Recent Labs  Lab 03/06/20 1417  INR 1.3*   Cardiac Enzymes: No results for input(s): CKTOTAL, CKMB, CKMBINDEX, TROPONINI in the last 168 hours. BNP (last 3 results) No results for input(s): PROBNP in the last 8760 hours. HbA1C: No results for input(s): HGBA1C in the last 72 hours. CBG: Recent Labs  Lab 03/10/20 2135 03/11/20 0632 03/11/20 0835 03/11/20 1017 03/11/20 1150  GLUCAP 151* 151* 141* 118* 127*   Lipid Profile: No results for input(s): CHOL, HDL, LDLCALC, TRIG, CHOLHDL, LDLDIRECT in the last 72 hours. Thyroid Function Tests: No results for input(s): TSH, T4TOTAL, FREET4, T3FREE, THYROIDAB in the last 72 hours. Anemia Panel: No results for input(s): VITAMINB12, FOLATE, FERRITIN, TIBC, IRON, RETICCTPCT in the last 72 hours.    Radiology Studies: I have reviewed all of the imaging during this hospital visit personally     Scheduled Meds: . atorvastatin  20 mg Oral Daily  . Chlorhexidine Gluconate Cloth  6 each Topical Daily  . clopidogrel  75 mg Oral Daily  . diltiazem  60 mg Oral Q6H  . docusate sodium  100 mg Oral Daily  . fentaNYL      . insulin aspart  0-15 Units Subcutaneous TID WC  . pantoprazole (PROTONIX) IV  40 mg Intravenous  Daily  . umeclidinium bromide  1 puff Inhalation Daily   Continuous Infusions: . sodium chloride Stopped (03/08/20 1008)  . sodium chloride    . bivalirudin (ANGIOMAX) infusion 0.5 mg/mL (Non-ACS indications) 0.095 mg/kg/hr (03/11/20 0854)  . sodium chloride  LOS: 5 days        Rickelle Sylvestre Gerome Apley, MD

## 2020-03-11 NOTE — Progress Notes (Signed)
  Progress Note    03/11/2020 8:39 AM Day of Surgery  Subjective:  Having left leg pain  Vitals:   03/11/20 0500 03/11/20 0733  BP: 137/74   Pulse: 75   Resp: 16   Temp:  99.1 F (37.3 C)  SpO2: 98%     Physical Exam: [id number] Right leg dressing cdi Both feet are warm  CBC    Component Value Date/Time   WBC 8.0 03/11/2020 0318   RBC 2.58 (L) 03/11/2020 0318   HGB 7.2 (L) 03/11/2020 0318   HCT 22.7 (L) 03/11/2020 0318   PLT 100 (L) 03/11/2020 0318   MCV 88.0 03/11/2020 0318   MCH 27.9 03/11/2020 0318   MCHC 31.7 03/11/2020 0318   RDW 13.8 03/11/2020 0318   LYMPHSABS 2.1 03/06/2020 1417   MONOABS 0.6 03/06/2020 1417   EOSABS 0.1 03/06/2020 1417   BASOSABS 0.0 03/06/2020 1417    BMET    Component Value Date/Time   NA 135 03/11/2020 0318   K 3.7 03/11/2020 0318   CL 102 03/11/2020 0318   CO2 24 03/11/2020 0318   GLUCOSE 158 (H) 03/11/2020 0318   BUN 11 03/11/2020 0318   CREATININE 0.93 03/11/2020 0318   CALCIUM 8.8 (L) 03/11/2020 0318   GFRNONAA >60 03/11/2020 0318   GFRAA >60 03/11/2020 0318    INR    Component Value Date/Time   INR 1.3 (H) 03/06/2020 1417     Intake/Output Summary (Last 24 hours) at 03/11/2020 0839 Last data filed at 03/11/2020 0600 Gross per 24 hour  Intake 427.89 ml  Output 1600 ml  Net -1172.11 ml     Assessment/plan:  74 y.o. 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 Plan for repeat TEE today and washout possible closure of fasciotomy.     Vernecia Umble C. Donzetta Matters, MD [facility] Office: [phone] Pager: [phone]  03/11/2020 8:39 AM

## 2020-03-11 NOTE — Op Note (Signed)
    Patient name: Phinneas Shakoor MRN: [id number] DOB: Mar 03, 1946 Sex: male  03/11/2020 Pre-operative Diagnosis: open fasciotomy sites right leg Post-operative diagnosis:  Same Surgeon:  Erlene Quan C. Donzetta Matters, MD Procedure Performed:  Closure of right leg fasciotomies  Indications:  74yo male s/p bilateral EVAR limb thromboembolectomy's from groin approaches.  Also required right lower extremity fasciotomies.  He has a large atrial thrombus.  He is now indicated for take back to the operating room for possible fasciotomy closure and reevaluation of his thrombus.  Findings: Anesthesia performed transesophageal echo which demonstrated his left atrial thrombus.  Fasciotomies were amenable for closure without significant tension.   Procedure:  The patient was identified in the holding holding area and taken to operating room 9 where is placed upon operative general anesthesia was induced.  Anesthesia performed transesophageal echo identified the left atrial thrombus.  He was then sterilely prepped and draped in the right lower extremity in the usual fashion antibiotics were administered timeout was called.  We washed out the fasciotomy sites released the skin from the underlying soft tissue.  We then primarily closed them with staples.  There was not significant tension there was some superficial sloughing of the skin this was not full-thickness.  I completion both feet were warm and well-perfused.  He is awake and anesthesia having tolerated procedure without immediate complication.  All counts were correct at completion.  EBL: 10 cc     Kemond Amorin C. Donzetta Matters, MD [facility] Office: [phone] Pager: [phone]

## 2020-03-11 NOTE — Progress Notes (Signed)
PT Cancellation Note  Patient Details Name: [name] MRN: [id number] DOB: 06-May-1946   Cancelled Treatment:    Reason Eval/Treat Not Completed: Patient at procedure or test/unavailable (pt leaving for surgery)   [name] [name] 03/11/2020, 8:16 AM [name], PT Acute Rehabilitation Services Pager: [phone] Office: [phone]

## 2020-03-11 NOTE — Anesthesia Procedure Notes (Signed)
Procedure Name: Intubation Date/Time: 03/11/2020 9:10 AM Performed by: Babs Bertin, CRNA Pre-anesthesia Checklist: Patient identified, Emergency Drugs available, Suction available and Patient being monitored Patient Re-evaluated:Patient Re-evaluated prior to induction Oxygen Delivery Method: Circle System Utilized Preoxygenation: Pre-oxygenation with 100% oxygen Induction Type: IV induction Ventilation: Mask ventilation without difficulty Laryngoscope Size: Mac and 3 Grade View: Grade I Tube type: Oral Tube size: 7.5 mm Number of attempts: 1 Airway Equipment and Method: Stylet and Oral airway Placement Confirmation: ETT inserted through vocal cords under direct vision,  positive ETCO2 and breath sounds checked- equal and bilateral Secured at: 22 cm Tube secured with: Tape Dental Injury: Teeth and Oropharynx as per pre-operative assessment

## 2020-03-11 NOTE — Plan of Care (Signed)
  Problem: Education: Goal: Knowledge of General Education information will improve Description: Including pain rating scale, medication(s)/side effects and non-pharmacologic comfort measures Outcome: Progressing   Problem: Health Behavior/Discharge Planning: Goal: Ability to manage health-related needs will improve Outcome: Progressing   Problem: Clinical Measurements: Goal: Ability to maintain clinical measurements within normal limits will improve Outcome: Progressing Goal: Will remain free from infection Outcome: Progressing Goal: Diagnostic test results will improve Outcome: Progressing Goal: Respiratory complications will improve Outcome: Progressing Goal: Cardiovascular complication will be avoided Outcome: Progressing   Problem: Activity: Goal: Risk for activity intolerance will decrease Outcome: Progressing   Problem: Nutrition: Goal: Adequate nutrition will be maintained Outcome: Progressing   Problem: Coping: Goal: Level of anxiety will decrease Outcome: Progressing   Problem: Elimination: Goal: Will not experience complications related to bowel motility Outcome: Progressing Goal: Will not experience complications related to urinary retention Outcome: Progressing   Problem: Pain Managment: Goal: General experience of comfort will improve Outcome: Progressing   Problem: Safety: Goal: Ability to remain free from injury will improve Outcome: Progressing   Problem: Skin Integrity: Goal: Risk for impaired skin integrity will decrease Outcome: Progressing   Problem: Clinical Measurements: Goal: Signs and symptoms of graft occlusion will improve Outcome: Progressing   Problem: Skin Integrity: Goal: Demonstration of wound healing without infection will improve Outcome: Progressing

## 2020-03-11 NOTE — Anesthesia Preprocedure Evaluation (Addendum)
Anesthesia Evaluation  Patient identified by MRN, date of birth, ID band Patient awake    Reviewed: Allergy & Precautions, NPO status , Patient's Chart, lab work & pertinent test results  History of Anesthesia Complications Negative for: history of anesthetic complications  Airway Mallampati: II  TM Distance: >3 FB Neck ROM: Full    Dental  (+) Poor Dentition, Missing, Dental Advisory Given, Chipped   Pulmonary COPD,  COPD inhaler, former smoker,  03/06/2020 SARS coronavirus neg   breath sounds clear to auscultation       Cardiovascular hypertension, Pt. on medications (-) angina+ CAD, + Cardiac Stents and + Peripheral Vascular Disease  + dysrhythmias Atrial Fibrillation  Rhythm:Irregular Rate:Normal  03/06/2020 ECHO; EF 55-60%, mild MR, There is a large, mobile, lobulated mass in the LA. The echogenic character is largely homogenous. The edges are smooth and rounded. This is likely a thrombus.  It measures approximately 10cm x 5cm. It appears anchored deep within the LUPV and the remaining portion is freely mobile. The most distal portion of the mass extends through the mitral valve during diastole. No significant inflow obstruction is seen   Neuro/Psych negative neurological ROS     GI/Hepatic negative GI ROS, Neg liver ROS,   Endo/Other  diabetes (glu 151), Oral Hypoglycemic Agents  Renal/GU      Musculoskeletal   Abdominal   Peds  Hematology  (+) Blood dyscrasia (Hb 7.2, plt 100k), anemia , Eliquis, plavix   Anesthesia Other Findings   Reproductive/Obstetrics                            Anesthesia Physical Anesthesia Plan  ASA: III  Anesthesia Plan: General   Post-op Pain Management:    Induction: Intravenous  PONV Risk Score and Plan: 2 and Ondansetron and Dexamethasone  Airway Management Planned: Oral ETT  Additional Equipment: TEE  Intra-op Plan:   Post-operative Plan:  Extubation in OR  Informed Consent: I have reviewed the patients History and Physical, chart, labs and discussed the procedure including the risks, benefits and alternatives for the proposed anesthesia with the patient or authorized representative who has indicated his/her understanding and acceptance.     Dental advisory given  Plan Discussed with: CRNA and Surgeon  Anesthesia Plan Comments:        Anesthesia Quick Evaluation

## 2020-03-12 ENCOUNTER — Inpatient Hospital Stay (HOSPITAL_COMMUNITY): Payer: Medicare PPO

## 2020-03-12 ENCOUNTER — Encounter (HOSPITAL_COMMUNITY): Payer: Self-pay | Admitting: Vascular Surgery

## 2020-03-12 DIAGNOSIS — Z01810 Encounter for preprocedural cardiovascular examination: Secondary | ICD-10-CM | POA: Diagnosis not present

## 2020-03-12 DIAGNOSIS — I998 Other disorder of circulatory system: Secondary | ICD-10-CM | POA: Diagnosis not present

## 2020-03-12 DIAGNOSIS — I10 Essential (primary) hypertension: Secondary | ICD-10-CM | POA: Diagnosis not present

## 2020-03-12 DIAGNOSIS — I513 Intracardiac thrombosis, not elsewhere classified: Secondary | ICD-10-CM

## 2020-03-12 DIAGNOSIS — I236 Thrombosis of atrium, auricular appendage, and ventricle as current complications following acute myocardial infarction: Secondary | ICD-10-CM | POA: Diagnosis not present

## 2020-03-12 DIAGNOSIS — I480 Paroxysmal atrial fibrillation: Secondary | ICD-10-CM | POA: Diagnosis not present

## 2020-03-12 LAB — MAGNESIUM: Magnesium: 1.8 mg/dL (ref 1.7–2.4)

## 2020-03-12 LAB — BASIC METABOLIC PANEL
Anion gap: 9 (ref 5–15)
BUN: 12 mg/dL (ref 8–23)
CO2: 24 mmol/L (ref 22–32)
Calcium: 9 mg/dL (ref 8.9–10.3)
Chloride: 103 mmol/L (ref 98–111)
Creatinine, Ser: 1.06 mg/dL (ref 0.61–1.24)
GFR calc Af Amer: >60 mL/min (ref >60)
GFR calc non Af Amer: >60 mL/min (ref >60)
Glucose, Bld: 155 mg/dL — ABNORMAL HIGH (ref 70–99)
Potassium: 4 mmol/L (ref 3.5–5.1)
Sodium: 136 mmol/L (ref 135–145)

## 2020-03-12 LAB — GLUCOSE, CAPILLARY
Glucose-Capillary: 158 mg/dL — ABNORMAL HIGH (ref 70–99)
Glucose-Capillary: 127 mg/dL — ABNORMAL HIGH (ref 70–99)
Glucose-Capillary: 130 mg/dL — ABNORMAL HIGH (ref 70–99)
Glucose-Capillary: 150 mg/dL — ABNORMAL HIGH (ref 70–99)

## 2020-03-12 LAB — CBC
HCT: 26.7 % — ABNORMAL LOW (ref 39.0–52.0)
Hemoglobin: 8.6 g/dL — ABNORMAL LOW (ref 13.0–17.0)
MCH: 28.1 pg (ref 26.0–34.0)
MCHC: 32.2 g/dL (ref 30.0–36.0)
MCV: 87.3 fL (ref 80.0–100.0)
Platelets: 125 10*3/uL — ABNORMAL LOW (ref 150–400)
RBC: 3.06 MIL/uL — ABNORMAL LOW (ref 4.22–5.81)
RDW: 13.9 % (ref 11.5–15.5)
WBC: 9.9 10*3/uL (ref 4.0–10.5)
nRBC: 0 % (ref 0.0–0.2)

## 2020-03-12 LAB — PHOSPHORUS: Phosphorus: 2.9 mg/dL (ref 2.5–4.6)

## 2020-03-12 LAB — APTT: aPTT: 62 seconds — ABNORMAL HIGH (ref 24–36)

## 2020-03-12 MED ORDER — METOPROLOL TARTRATE 5 MG/5ML IV SOLN: 5.0000 mg | Freq: Once | INTRAVENOUS | Status: AC

## 2020-03-12 MED ORDER — NITROGLYCERIN 0.4 MG SL SUBL: 0.8000 mg | SUBLINGUAL_TABLET | Freq: Once | SUBLINGUAL | Status: AC

## 2020-03-12 MED ORDER — IOHEXOL 350 MG/ML SOLN
80.0000 mL | Freq: Once | INTRAVENOUS | Status: AC | PRN
  Administered 2020-03-12: 80 mL via INTRAVENOUS

## 2020-03-12 MED ORDER — MORPHINE SULFATE (PF) 2 MG/ML IV SOLN
INTRAVENOUS | Status: AC
  Administered 2020-03-12: 2 mg via INTRAVENOUS
  Filled 2020-03-12: qty 1

## 2020-03-12 MED ORDER — METOPROLOL TARTRATE 5 MG/5ML IV SOLN
INTRAVENOUS | Status: AC
  Administered 2020-03-12: 5 mg via INTRAVENOUS
  Filled 2020-03-12: qty 5

## 2020-03-12 MED ORDER — NITROGLYCERIN 0.4 MG SL SUBL
SUBLINGUAL_TABLET | SUBLINGUAL | Status: AC
  Administered 2020-03-12: 0.8 mg via SUBLINGUAL
  Filled 2020-03-12: qty 2

## 2020-03-12 NOTE — Progress Notes (Signed)
1 Day Post-Op Procedure(s) (LRB): RIGHT LOWER EXTREMITY FASCIOTOMY WASHOUT AND CLOSURE (Right) TRANSESOPHAGEAL ECHOCARDIOGRAM (TEE) Subjective: Patient stable after closure of right fasciotomy leg incisions yesterday He still has not been up out of bed or ambulated I discussed the echo findings from the TEE performed yesterday in the OR-no significant improvement in a large left atrial thrombus which is mobile extending from the left superior pulmonary vein to the mitral valve.  Patient's platelet count has significantly improved.  The serotonin release assay ordered over 5 days ago has not been reported to so I sent another one to [place].  I have recommended to the patient that he consider sternotomy, cardiopulmonary bypass, and extraction of left atrial thrombus later after patient has been recovered from his recent surgery and is able to ambulate- if follow-up echo shows no further improvement with anticoagulation.   will continue IV bivalirudin until a negative serotonin release assay is reported  Cardiac CT scan has been ordered to assess the thrombus more clearly as well as to evaluate the coronaries in preparation for possible extraction of left atrial thrombus  Objective: Vital signs in last 24 hours: Temp:  [97.9 F (36.6 C)-101 F (38.3 C)] 98.8 F (37.1 C) (07/21 0706) Pulse Rate:  [66-132] 79 (07/21 0706) Cardiac Rhythm: Normal sinus rhythm (07/21 0400) Resp:  [15-31] 19 (07/21 0706) BP: (104-149)/(61-91) 122/75 (07/21 0700) SpO2:  [90 %-100 %] 98 % (07/21 0706) Weight:  [88.1 kg] 88.1 kg (07/21 0600)  Hemodynamic parameters for last 24 hours:    Intake/Output from previous day: 07/20 0701 - 07/21 0700 In: 2261.2 [P.O.:270; I.V.:1376.2; Blood:315; IV Piggyback:300] Out: 7371 [Urine:1325; Blood:1] Intake/Output this shift: No intake/output data recorded.   Exam Alert and oriented Sinus rhythm without murmur Right lower leg incisions clean and dry Both feet  warm Abdomen nontender  Lab Results: Recent Labs    03/11/20 0318 03/12/20 0251  WBC 8.0 9.9  HGB 7.2* 8.6*  HCT 22.7* 26.7*  PLT 100* 125*   BMET:  Recent Labs    03/11/20 0318 03/12/20 0251  NA 135 136  K 3.7 4.0  CL 102 103  CO2 24 24  GLUCOSE 158* 155*  BUN 11 12  CREATININE 0.93 1.06  CALCIUM 8.8* 9.0    PT/INR: No results for input(s): LABPROT, INR in the last 72 hours. ABG    Component Value Date/Time   PHART 7.418 03/06/2020 1807   HCO3 23.2 03/06/2020 1807   TCO2 24 03/06/2020 1807   ACIDBASEDEF 1.0 03/06/2020 1807   O2SAT 99.0 03/06/2020 1807   CBG (last 3)  Recent Labs    03/11/20 1609 03/11/20 2132 03/12/20 0650  GLUCAP 149* 129* 158*    Assessment/Plan: S/P Procedure(s) (LRB): RIGHT LOWER EXTREMITY FASCIOTOMY WASHOUT AND CLOSURE (Right) TRANSESOPHAGEAL ECHOCARDIOGRAM (TEE) Continue IV bivalirudin  until SRA is reported negative If the patient declines surgery then long-term Coumadin with Plavix would be recommended rather than resuming Eliquis which he was taking when the thrombus was formed.   LOS: 6 days    Tharon Aquas Trigt III 03/12/2020

## 2020-03-12 NOTE — Progress Notes (Signed)
[facility] for Bivalirudin  Indication: LA thrombus, occluded aortic stent graft, s/p OR   Allergies  Allergen Reactions  . Erythromycin Shortness Of Breath and Swelling  . Simvastatin Other (See Comments)    CAUSE MUSCLE PAIN,.  . Statins Other (See Comments)    Muscle pain    Patient Measurements: Height: 6\' 3"  (190.5 cm) Weight: 88.1 kg (194 lb 3.6 oz) IBW/kg (Calculated) : 84.5 HEPARIN DW (KG): 79 kg  Vital Signs: Temp: 98.8 F (37.1 C) (07/21 0706) Temp Source: Oral (07/21 0706) BP: 122/75 (07/21 0700) Pulse Rate: 79 (07/21 0706)  Labs: Recent Labs    03/10/20 0234 03/10/20 0234 03/10/20 4742 03/11/20 0318 03/11/20 2017 03/12/20 0251  HGB 8.2*   < >  --  7.2*  --  8.6*  HCT 25.9*  --   --  22.7*  --  26.7*  PLT 69*  --   --  100*  --  125*  APTT 63*   < >  --  62* 62* 62*  CREATININE 0.95  --   --  0.93  --  1.06  TROPONINIHS  --   --  13  --   --   --    < > = values in this interval not displayed.    Estimated Creatinine Clearance: 74.2 mL/min (by C-G formula based on SCr of 1.06 mg/dL).   Assessment:  74 y.o. 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 Patient was taken to OR for thrombectomy and intraop TEE revealed LA thrombus. Pharmacy initially consulted for IV heparin dosing, but was changed to bivalirudin for possible HIT. 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 He is s/p fasciotomy closure 7/20 and possible surgical evacuation of thrombus next week. -aPTT at goal -hg= 8.6, plt= 125    Goal of Therapy:  aPTT 50-85 secs Monitor platelets by anticoagulation protocol: Yes  Plan:  Continue bivalirudin to 0.095 mg/kg/hr Monitor daily aPTT, CBC, s/sx bleeding F/u HIT SRA results  Hildred Laser,  PharmD Clinical Pharmacist **Pharmacist phone directory can now be found on [email or web address] ([email or web address]).  Listed under [place].

## 2020-03-12 NOTE — Progress Notes (Signed)
Carotid duplex bilateral study completed.   See Cv Proc for preliminary results.   Shantasia Hunnell  

## 2020-03-12 NOTE — Progress Notes (Signed)
  Progress Note    03/12/2020 7:40 AM 1 Day Post-Op  Subjective:  Feeling his legs more  Vitals:   03/12/20 0700 03/12/20 0706  BP: 122/75   Pulse: 78 79  Resp: (!) 22 19  Temp:    SpO2: 98% 98%    Physical Exam: Awake and alert Right groin cdi, left groin with stable hematoma Strong bilateral dp signals R leg fasciotomy sites are in tact  CBC    Component Value Date/Time   WBC 9.9 03/12/2020 0251   RBC 3.06 (L) 03/12/2020 0251   HGB 8.6 (L) 03/12/2020 0251   HCT 26.7 (L) 03/12/2020 0251   PLT 125 (L) 03/12/2020 0251   MCV 87.3 03/12/2020 0251   MCH 28.1 03/12/2020 0251   MCHC 32.2 03/12/2020 0251   RDW 13.9 03/12/2020 0251   LYMPHSABS 2.1 03/06/2020 1417   MONOABS 0.6 03/06/2020 1417   EOSABS 0.1 03/06/2020 1417   BASOSABS 0.0 03/06/2020 1417    BMET    Component Value Date/Time   NA 136 03/12/2020 0251   K 4.0 03/12/2020 0251   CL 103 03/12/2020 0251   CO2 24 03/12/2020 0251   GLUCOSE 155 (H) 03/12/2020 0251   BUN 12 03/12/2020 0251   CREATININE 1.06 03/12/2020 0251   CALCIUM 9.0 03/12/2020 0251   GFRNONAA >60 03/12/2020 0251   GFRAA >60 03/12/2020 0251    INR    Component Value Date/Time   INR 1.3 (H) 03/06/2020 1417     Intake/Output Summary (Last 24 hours) at 03/12/2020 0740 Last data filed at 03/12/2020 0700 Gross per 24 hour  Intake 2261.15 ml  Output 1326 ml  Net 935.15 ml     Assessment/plan:  74 y.o. male is s/p bilateral lower extremity thrombectomy and right lower extremity fasciotomy now closed.  He will need surgical removal of left atrial thrombus at the discretion of CT surgery.  He is stable from a vascular surgery standpoint at this time.  Adrea Sherpa C. Donzetta Matters, MD [facility] Office: [phone] Pager: [phone]  03/12/2020 7:40 AM

## 2020-03-12 NOTE — Progress Notes (Signed)
PROGRESS NOTE        PATIENT DETAILS Name: [name] Age: 74 y.o. Sex: male Date of Birth: 11-03-1945 Admit Date: 03/06/2020 Admitting Physician Garner Nash, DO [facility]  Brief Narrative: Patient is a 74 y.o. male with hx AAA s/p repair in 12/2019 with endovascular stent graft s/p left iliac thrombectomy and left external iliac stent, PVD, HTN, DM2, CAD,  atrial fibrillation presented with right ischemic leg-further evaluation revealed occluded right/left limb of the aorto biiliac endograft-underwent emergent thrombectomy and right lower extremity 4 compartment fasciotomy-further evaluation with a intraoperative TEE showed a mobile left atrial thrombus.  See below for further details.  Significant events: 7/15>> admit for right ischemic leg-underwent emergent thrombectomy/fasciotomy of right leg  Significant studies: 7/15>> CT angio lower extremities: Right limb of the aortic stent graft occluded 7/21>> carotid Doppler: None hemodynamically significant stenosis bilaterally  Antimicrobial therapy: None  Microbiology data: None  Procedures : 7/20>> intraoperative TEE: Large lobulated mobile thrombus in the left atrium 7/20>> closure of right leg fasciotomy 7/15>> intraoperative TEE: Large LA thrombus 7/15>> Thrombectomy of right limb, left limb of aorto biiliac endograft, right lower extremity 4 compartment fasciotomy  Consults: PCCM, VVS, CTVS  DVT Prophylaxis : SCDs Start: 03/06/20 1559 IV bivalirudin.  Subjective: Lying comfortably in bed-denies any chest pain or shortness of breath.  Assessment/Plan: Acute bilateral occlusion of aortoiliac grafts with right ischemic leg-s/p thrombectomy/4 compartment fasciotomy likely secondary to left atrial thrombus: Vascular surgery following-s/p thrombectomy-found to have a large LA thrombus-remains on IV anticoagulation-CT surgery contemplating sternotomy, cardiopulmonry bypass and  resection of LA thrombus-work-up in progress.  PAF: Sinus rhythm-continue diltiazem-on IV bivalirudin-awaiting HIT antibody.  Thrombocytopenia: Improved-continue bivalirudin-HIT antibody negative on 7/15-SRA still pending.  Normocytic anemia: Likely secondary to critical illness-no indication of GI loss.  Follow for now.  COPD: Not in exacerbation-continue bronchodilators  HTN: BP stable  CAD: No anginal symptoms-continue Plavix/statin  HLD: Continue statin  DM-2: CBG stable-continue SSI  CBG (last 3)  Recent Labs    03/11/20 2132 03/12/20 0650 03/12/20 1133  GLUCAP 129* 158* 127*   Diet: Diet Order            Diet Heart Room service appropriate? Yes; Fluid consistency: Thin  Diet effective now                  Code Status: Full code   Family Communication: None at bedside-we will discuss with family over the next few days.   Disposition Plan: Home vs Home with Home health vs SNF when ready for discharge Status is: Inpatient  Remains inpatient appropriate because:Inpatient level of care appropriate due to severity of illness   Dispo: The patient is from: Home              Anticipated d/c is to: TBD              Anticipated d/c date is: > 3 days              Patient currently is not medically stable to d/c.  Barriers to Discharge: Ischemic right leg-s/p emergent thrombectomy-found to have a large left atrial thrombus-CT surgery eval in progress-he may need cardiopulmonary bypass/thrombectomy  Antimicrobial agents: Anti-infectives (From admission, onward)   Start     Dose/Rate Route Frequency Ordered Stop   03/07/20 0200  ceFAZolin (ANCEF) IVPB 2g/100 mL premix  2 g 200 mL/hr over 30 Minutes Intravenous Every 8 hours 03/06/20 2112 03/07/20 1052   03/06/20 1958  ceFAZolin (ANCEF) 2-4 GM/100ML-% IVPB       Note to Pharmacy: Alba Cory   : cabinet override      03/06/20 1958 03/07/20 0759       Time spent: 35 minutes-Greater than 50% of this  time was spent in counseling, explanation of diagnosis, planning of further management, and coordination of care.  MEDICATIONS: Scheduled Meds: . atorvastatin  20 mg Oral Daily  . chlorhexidine  15 mL Mouth/Throat Once   Or  . mouth rinse  15 mL Mouth Rinse Once  . Chlorhexidine Gluconate Cloth  6 each Topical Daily  . clopidogrel  75 mg Oral Daily  . diltiazem  90 mg Oral Q6H  . docusate sodium  100 mg Oral Daily  . insulin aspart  0-15 Units Subcutaneous TID WC  . pantoprazole (PROTONIX) IV  40 mg Intravenous Daily  . umeclidinium bromide  1 puff Inhalation Daily   Continuous Infusions: . sodium chloride Stopped (03/08/20 1008)  . bivalirudin (ANGIOMAX) infusion 0.5 mg/mL (Non-ACS indications) 0.095 mg/kg/hr (03/12/20 0700)  . sodium chloride     PRN Meds:.sodium chloride, acetaminophen **OR** acetaminophen, albuterol, alum & mag hydroxide-simeth, guaiFENesin-dextromethorphan, metoprolol tartrate, morphine injection, ondansetron, oxyCODONE, phenol, polyethylene glycol, white petrolatum   PHYSICAL EXAM: Vital signs: Vitals:   03/12/20 0500 03/12/20 0600 03/12/20 0700 03/12/20 0706  BP: 110/69 130/70 122/75   Pulse:   78 79  Resp: 20 (!) 21 (!) 22 19  Temp:    98.8 F (37.1 C)  TempSrc:    Oral  SpO2:  96% 98% 98%  Weight:  88.1 kg    Height:       Filed Weights   03/10/20 0500 03/11/20 0510 03/12/20 0600  Weight: 90.4 kg 88 kg 88.1 kg   Body mass index is 24.28 kg/m.   Gen Exam:Alert awake-not in any distress HEENT:atraumatic, normocephalic Chest: B/L clear to auscultation anteriorly CVS:S1S2 regular Abdomen:soft non tender, non distended Extremities:no edema Neurology: Non focal Skin: no rash  I have personally reviewed following labs and imaging studies  LABORATORY DATA: CBC: Recent Labs  Lab 03/06/20 1417 03/06/20 1432 03/08/20 0415 03/09/20 0301 03/10/20 0234 03/11/20 0318 03/12/20 0251  WBC 9.5   < > 9.5 11.2* 11.5* 8.0 9.9  NEUTROABS 6.6  --    --   --   --   --   --   HGB 10.9*   < > 7.9* 7.9* 8.2* 7.2* 8.6*  HCT 35.8*   < > 24.3* 24.6* 25.9* 22.7* 26.7*  MCV 89.7   < > 86.2 88.5 89.3 88.0 87.3  PLT 51*   < > 34* 49* 69* 100* 125*   < > = values in this interval not displayed.    Basic Metabolic Panel: Recent Labs  Lab 03/07/20 0459 03/07/20 1700 03/08/20 0415 03/09/20 0301 03/10/20 0234 03/11/20 0318 03/12/20 0251  NA 138   < > 139 136 134* 135 136  K 4.2   < > 3.8 3.9 4.3 3.7 4.0  CL 107   < > 107 103 100 102 103  CO2 20*   < > 24 23 27 24 24   GLUCOSE 207*   < > 162* 139* 161* 158* 155*  BUN 13   < > 10 9 12 11 12   CREATININE 1.06   < > 0.98 0.99 0.95 0.93 1.06  CALCIUM 8.4*   < >  8.7* 9.0 8.8* 8.8* 9.0  MG 1.6*   < > 1.8 2.0 1.9 2.0 1.8  PHOS 3.1  --   --   --  2.7 2.7 2.9   < > = values in this interval not displayed.    GFR: Estimated Creatinine Clearance: 74.2 mL/min (by C-G formula based on SCr of 1.06 mg/dL).  Liver Function Tests: Recent Labs  Lab 03/11/20 0318  AST 38  ALT 22  ALKPHOS 50  BILITOT 1.8*  PROT 5.9*  ALBUMIN 2.9*   No results for input(s): LIPASE, AMYLASE in the last 168 hours. No results for input(s): AMMONIA in the last 168 hours.  Coagulation Profile: Recent Labs  Lab 03/06/20 1417  INR 1.3*    Cardiac Enzymes: No results for input(s): CKTOTAL, CKMB, CKMBINDEX, TROPONINI in the last 168 hours.  BNP (last 3 results) No results for input(s): PROBNP in the last 8760 hours.  Lipid Profile: No results for input(s): CHOL, HDL, LDLCALC, TRIG, CHOLHDL, LDLDIRECT in the last 72 hours.  Thyroid Function Tests: No results for input(s): TSH, T4TOTAL, FREET4, T3FREE, THYROIDAB in the last 72 hours.  Anemia Panel: No results for input(s): VITAMINB12, FOLATE, FERRITIN, TIBC, IRON, RETICCTPCT in the last 72 hours.  Urine analysis:    Component Value Date/Time   COLORURINE YELLOW 07/02/2016 2112   APPEARANCEUR CLEAR 07/02/2016 2112   LABSPEC 1.006 07/02/2016 2112    PHURINE 5.5 07/02/2016 2112   GLUCOSEU NEGATIVE 07/02/2016 2112   HGBUR LARGE (A) 07/02/2016 2112   BILIRUBINUR NEGATIVE 07/02/2016 2112   KETONESUR NEGATIVE 07/02/2016 2112   PROTEINUR NEGATIVE 07/02/2016 2112   NITRITE NEGATIVE 07/02/2016 2112   LEUKOCYTESUR TRACE (A) 07/02/2016 2112    Sepsis Labs: Lactic Acid, Venous    Component Value Date/Time   LATICACIDVEN 2.4 ([facility]) 03/06/2020 2158    MICROBIOLOGY: Recent Results (from the past 240 hour(s))  SARS Coronavirus 2 by RT PCR (hospital order, performed in [place] hospital lab) Nasopharyngeal Nasopharyngeal Swab     Status: None   Collection Time: 03/06/20  2:28 PM   Specimen: Nasopharyngeal Swab  Result Value Ref Range Status   SARS Coronavirus 2 NEGATIVE NEGATIVE Final    Comment: (NOTE) SARS-CoV-2 target nucleic acids are NOT DETECTED.  The SARS-CoV-2 RNA is generally detectable in upper and lower respiratory specimens during the acute phase of infection. The lowest concentration of SARS-CoV-2 viral copies this assay can detect is 250 copies / mL. A negative result does not preclude SARS-CoV-2 infection and should not be used as the sole basis for treatment or other patient management decisions.  A negative result may occur with improper specimen collection / handling, submission of specimen other than nasopharyngeal swab, presence of viral mutation(s) within the areas targeted by this assay, and inadequate number of viral copies (<250 copies / mL). A negative result must be combined with clinical observations, patient history, and epidemiological information.  Fact Sheet for Patients:   StrictlyIdeas.no  Fact Sheet for Healthcare Providers: BankingDealers.co.za  This test is not yet approved or  cleared by the [place] FDA and has been authorized for detection and/or diagnosis of SARS-CoV-2 by FDA under an Emergency Use Authorization (EUA).  This EUA will  remain in effect (meaning this test can be used) for the duration of the COVID-19 declaration under Section 564(b)(1) of the Act, 21 U.S.C. section 360bbb-3(b)(1), unless the authorization is terminated or revoked sooner.  Performed at [facility], [place] [address] [place], [place] [address]  MRSA PCR Screening     Status: None   Collection Time: 03/06/20  9:38 PM   Specimen: Nasal Mucosa; Nasopharyngeal  Result Value Ref Range Status   MRSA by PCR NEGATIVE NEGATIVE Final    Comment:        The GeneXpert MRSA Assay (FDA approved for NASAL specimens only), is one component of a comprehensive MRSA colonization surveillance program. It is not intended to diagnose MRSA infection nor to guide or monitor treatment for MRSA infections. Performed at [facility], [place] [address] [place], [place] [address]     RADIOLOGY STUDIES/RESULTS: ECHO INTRAOPERATIVE TEE  Result Date: 03/11/2020  *INTRAOPERATIVE TRANSESOPHAGEAL REPORT *  Patient Name:   ALDAN CAMEY  Date of Exam: 03/11/2020 Medical Rec #:  [id number]      Height:       75.0 in Accession #:    [id number]     Weight:       194.0 lb Date of Birth:  Jan 13, 1946       BSA:          2.17 m Patient Age:    53 years       BP:           137/74 mmHg Patient Gender: M              HR:           75 bpm. Exam Location:  Anesthesiology Transesophogeal exam was perform intraoperatively during surgical procedure. Patient was closely monitored under general anesthesia during the entirety of examination. Indications:     left atrial thrombus Sonographer:     Jannett Celestine RDCS (AE) Performing Phys: Annye Asa MD Diagnosing Phys: Annye Asa MD Report CC'd to:  Servando Snare MD Complications: No known complications during this procedure. PRE-OP FINDINGS  Left Ventricle: The left ventricle has low normal systolic function, with an ejection fraction of 50-55% 52%. The cavity size was normal. There is no increase in left ventricular wall  thickness. No evidence of left ventricular regional wall motion abnormalities. Right Ventricle: The right ventricle has normal systolic function. The cavity was normal. There is no increase in right ventricular wall thickness. Left Atrium: Left atrial size was mildly dilated. There is a very large, lobulated, mobile thrombus present in the L atrium. The atrial portion measures 2.58 x3.04 cm, however, the mass appears to extend well into the L superior pulmonary vein. It most likely obstructs flow from the L superior pulmonary vein. The thrombus does traverse the mitral apparatus during diastole, but it does not seem to affect forward flow. The left atrial appendage is well visualized and there is no evidence of thrombus present in it. Left atrial appendage velocity is normal at greater than 40 cm/s. Right Atrium: Right atrial size was normal in size. Right atrial pressure is estimated at 10 mmHg. Interatrial Septum: No atrial level shunt detected by color flow Doppler. Pericardium: There is no evidence of pericardial effusion. Mitral Valve: The mitral valve is normal in structure. No thickening of the mitral valve leaflet. No calcification of the mitral valve leaflet. Mitral valve regurgitation is not visualized by color flow Doppler. There is no evidence of mitral valve vegetation. There is no evidence of mitral stenosis. Tricuspid Valve: The tricuspid valve was normal in structure. Tricuspid valve regurgitation is trivial by color flow Doppler. There is no evidence of tricuspid valve vegetation. Aortic Valve: The aortic valve is tricuspid Aortic valve regurgitation was not visualized by color flow Doppler.  There is no evidence of aortic valve stenosis. There is no evidence of a vegetation on the aortic valve. Pulmonic Valve: The pulmonic valve was normal in structure, with normal leaflet excursion. No evidence of pulmonic stenosis. Pulmonic valve regurgitation is trivial by color flow Doppler. Aorta: The aortic  root, ascending aorta and aortic arch are normal in size and structure. There is evidence of scattered layered plaque in the descending aorta; Grade I, measuring 1-84mm in size. Pulmonary Artery: The pulmonary artery is of normal size. Venous: The inferior vena cava was not well visualized, but appears normal in size. +--------------+-------++ LEFT VENTRICLE        +--------------+-------++ PLAX 2D               +--------------+-------++ LVIDd:        4.13 cm +--------------+-------++ LVIDs:        3.03 cm +--------------+-------++ LV SV:        40 ml   +--------------+-------++ LV SV Index:  18.36   +--------------+-------++                       +--------------+-------++ +-------------+-----------++ AORTIC VALVE             +-------------+-----------++ AV Vmax:     143.00 cm/s +-------------+-----------++ AV Vmean:    94.100 cm/s +-------------+-----------++ AV VTI:      0.208 m     +-------------+-----------++ AV Peak Grad:8.2 mmHg    +-------------+-----------++ AV Mean Grad:4.0 mmHg    +-------------+-----------++ +-------------+---------++ MITRAL VALVE           +-------------+---------++ MV Peak grad:5.7 mmHg  +-------------+---------++ MV Mean grad:2.0 mmHg  +-------------+---------++ MV Vmax:     1.19 m/s  +-------------+---------++ MV Vmean:    68.8 cm/s +-------------+---------++ MV VTI:      0.32 m    +-------------+---------++  Annye Asa MD Electronically signed by Annye Asa MD Signature Date/Time: 03/11/2020/2:09:20 PM    Final    VAS US CAROTID  Result Date: 03/12/2020 Carotid Arterial Duplex Study Indications:       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 Risk Factors:      Hypertension, Diabetes, coronary artery disease. Comparison Study:  No prior studies. Performing Technologist: Darlin Coco  Examination Guidelines: A complete evaluation includes B-mode imaging, spectral Doppler, color Doppler,  and power Doppler as needed of all accessible portions of each vessel. Bilateral testing is considered an integral part of a complete examination. Limited examinations for reoccurring indications may be performed as noted.  Right Carotid Findings: +----------+--------+--------+--------+-----------------------+--------+           PSV cm/sEDV cm/sStenosisPlaque Description     Comments +----------+--------+--------+--------+-----------------------+--------+ CCA Prox  63      12              heterogenous and smooth         +----------+--------+--------+--------+-----------------------+--------+ CCA Mid   100     21              heterogenous and smooth         +----------+--------+--------+--------+-----------------------+--------+ CCA Distal80      13              heterogenous and smooth         +----------+--------+--------+--------+-----------------------+--------+ ICA Prox  47      13      1-39%   heterogenous                    +----------+--------+--------+--------+-----------------------+--------+  ICA Distal52      20                                              +----------+--------+--------+--------+-----------------------+--------+ ECA       51      9                                               +----------+--------+--------+--------+-----------------------+--------+ +----------+--------+-------+----------------+-------------------+           PSV cm/sEDV cmsDescribe        Arm Pressure (mmHG) +----------+--------+-------+----------------+-------------------+ Subclavian               Multiphasic, WNL                    +----------+--------+-------+----------------+-------------------+ +---------+--------+--+--------+--+---------+ VertebralPSV cm/s73EDV cm/s22Antegrade +---------+--------+--+--------+--+---------+  Left Carotid Findings: +----------+--------+--------+--------+-----------------------+--------+           PSV cm/sEDV  cm/sStenosisPlaque Description     Comments +----------+--------+--------+--------+-----------------------+--------+ CCA Prox  97      20              heterogenous                    +----------+--------+--------+--------+-----------------------+--------+ CCA Mid   93      19              heterogenous and smooth         +----------+--------+--------+--------+-----------------------+--------+ CCA Distal104     25              heterogenous and smooth         +----------+--------+--------+--------+-----------------------+--------+ ICA Prox  59      10      1-39%   heterogenous                    +----------+--------+--------+--------+-----------------------+--------+ ICA Distal92      34                                              +----------+--------+--------+--------+-----------------------+--------+ ECA       97      13                                              +----------+--------+--------+--------+-----------------------+--------+ +----------+--------+--------+----------------+-------------------+           PSV cm/sEDV cm/sDescribe        Arm Pressure (mmHG) +----------+--------+--------+----------------+-------------------+ Subclavian110             Multiphasic, WNL                    +----------+--------+--------+----------------+-------------------+ +---------+--------+--+--------+--+---------+ VertebralPSV cm/s93EDV cm/s29Antegrade +---------+--------+--+--------+--+---------+   Summary: Right Carotid: Velocities in the right ICA are consistent with a 1-39% stenosis.                Non-hemodynamically significant plaque <50% noted in the CCA. Left Carotid: Velocities in the left ICA are consistent with a 1-39% stenosis.  Non-hemodynamically significant plaque <50% noted in the CCA.  *See table(s) above for measurements and observations.     Preliminary      LOS: 6 days   Oren Binet, MD  [facility]    To  contact the attending provider between 7A-7P or the covering provider during after hours 7P-7A, please log into the web site [email or web address] and access using universal [place] password for that web site. If you do not have the password, please call the hospital operator.  03/12/2020, 11:32 AM

## 2020-03-12 NOTE — Progress Notes (Signed)
PT Cancellation Note  Patient Details Name: [name] MRN: [id number] DOB: 1945/09/07   Cancelled Treatment:    Reason Eval/Treat Not Completed: Patient at procedure or test/unavailable (carotid duplex in AM, not in CT). Will follow-up for PT treatment as schedule permits.  Mabeline Caras, PT, DPT Acute Rehabilitation Services  Pager [phone] Office [place] 03/12/2020, 4:35 PM

## 2020-03-13 ENCOUNTER — Inpatient Hospital Stay (HOSPITAL_COMMUNITY): Payer: Medicare PPO

## 2020-03-13 DIAGNOSIS — I10 Essential (primary) hypertension: Secondary | ICD-10-CM | POA: Diagnosis not present

## 2020-03-13 DIAGNOSIS — I513 Intracardiac thrombosis, not elsewhere classified: Secondary | ICD-10-CM | POA: Diagnosis not present

## 2020-03-13 DIAGNOSIS — I480 Paroxysmal atrial fibrillation: Secondary | ICD-10-CM | POA: Diagnosis not present

## 2020-03-13 DIAGNOSIS — L8990 Pressure ulcer of unspecified site, unspecified stage: Secondary | ICD-10-CM | POA: Insufficient documentation

## 2020-03-13 DIAGNOSIS — I998 Other disorder of circulatory system: Secondary | ICD-10-CM | POA: Diagnosis not present

## 2020-03-13 LAB — CBC
HCT: 27.3 % — ABNORMAL LOW (ref 39.0–52.0)
Hemoglobin: 8.6 g/dL — ABNORMAL LOW (ref 13.0–17.0)
MCH: 27.8 pg (ref 26.0–34.0)
MCHC: 31.5 g/dL (ref 30.0–36.0)
MCV: 88.3 fL (ref 80.0–100.0)
Platelets: 139 10*3/uL — ABNORMAL LOW (ref 150–400)
RBC: 3.09 MIL/uL — ABNORMAL LOW (ref 4.22–5.81)
RDW: 13.8 % (ref 11.5–15.5)
WBC: 10.6 10*3/uL — ABNORMAL HIGH (ref 4.0–10.5)
nRBC: 0 % (ref 0.0–0.2)

## 2020-03-13 LAB — BASIC METABOLIC PANEL
Anion gap: 10 (ref 5–15)
BUN: 10 mg/dL (ref 8–23)
CO2: 23 mmol/L (ref 22–32)
Calcium: 8.8 mg/dL — ABNORMAL LOW (ref 8.9–10.3)
Chloride: 102 mmol/L (ref 98–111)
Creatinine, Ser: 0.99 mg/dL (ref 0.61–1.24)
GFR calc Af Amer: >60 mL/min (ref >60)
GFR calc non Af Amer: >60 mL/min (ref >60)
Glucose, Bld: 134 mg/dL — ABNORMAL HIGH (ref 70–99)
Potassium: 3.6 mmol/L (ref 3.5–5.1)
Sodium: 135 mmol/L (ref 135–145)

## 2020-03-13 LAB — PHOSPHORUS: Phosphorus: 2.5 mg/dL (ref 2.5–4.6)

## 2020-03-13 LAB — GLUCOSE, CAPILLARY
Glucose-Capillary: 144 mg/dL — ABNORMAL HIGH (ref 70–99)
Glucose-Capillary: 200 mg/dL — ABNORMAL HIGH (ref 70–99)
Glucose-Capillary: 172 mg/dL — ABNORMAL HIGH (ref 70–99)
Glucose-Capillary: 183 mg/dL — ABNORMAL HIGH (ref 70–99)

## 2020-03-13 LAB — APTT: aPTT: 62 seconds — ABNORMAL HIGH (ref 24–36)

## 2020-03-13 MED ORDER — FLUCONAZOLE 100 MG PO TABS
100.0000 mg | ORAL_TABLET | Freq: Every day | ORAL | Status: AC
  Administered 2020-03-13 – 2020-03-14 (×2): 100 mg via ORAL
  Filled 2020-03-13 (×2): qty 1

## 2020-03-13 MED ORDER — IOHEXOL 300 MG/ML  SOLN
75.0000 mL | Freq: Once | INTRAMUSCULAR | Status: AC | PRN
  Administered 2020-03-13: 75 mL via INTRAVENOUS

## 2020-03-13 MED ORDER — GERHARDT'S BUTT CREAM
TOPICAL_CREAM | Freq: Three times a day (TID) | CUTANEOUS | Status: DC
  Administered 2020-03-13: 1 via TOPICAL
  Filled 2020-03-13: qty 1

## 2020-03-13 MED ORDER — ENOXAPARIN SODIUM 100 MG/ML ~~LOC~~ SOLN: 1.0000 mg/kg | Freq: Once | SUBCUTANEOUS | Status: DC

## 2020-03-13 NOTE — Progress Notes (Signed)
      [address]       [place] [address]             [phone]       Only complaint today is that he is cold. I placed three warm blankets on him. Otherwise, he is doing okay. Serotonin test for HIT still pending-hopefully will know more by the end of the day. Decision regarding CT surgery for atrial thrombus pending.   Nicholes Rough, PA-C

## 2020-03-13 NOTE — Progress Notes (Addendum)
  Progress Note    03/13/2020 7:34 AM 2 Days Post-Op  Subjective:  LLE incision pain and my "rump hurts"   Vitals:   03/13/20 0618 03/13/20 0700  BP: 103/90 132/70  Pulse:  74  Resp:  20  Temp:  97.8 F (36.6 C)  SpO2:  95%    Physical Exam: Cardiac:  RRR Lungs:  Upper airway noise. Clears with cough Incisions:  RLE fasciotomy sites covered with Xeroform. Both groin incisions well approximated. Ecchymosis of right medial thigh. Both groins with minor degree of nonexpanding hematom. No drainage or signs of infection Extremities:  Minimal RLE edema. Both feet warm with intact motor function. Sensation intact. Brisk 2+ DP pulses. Abdomen:  soft  CBC    Component Value Date/Time   WBC 10.6 (H) 03/13/2020 0304   RBC 3.09 (L) 03/13/2020 0304   HGB 8.6 (L) 03/13/2020 0304   HCT 27.3 (L) 03/13/2020 0304   PLT 139 (L) 03/13/2020 0304   MCV 88.3 03/13/2020 0304   MCH 27.8 03/13/2020 0304   MCHC 31.5 03/13/2020 0304   RDW 13.8 03/13/2020 0304   LYMPHSABS 2.1 03/06/2020 1417   MONOABS 0.6 03/06/2020 1417   EOSABS 0.1 03/06/2020 1417   BASOSABS 0.0 03/06/2020 1417    BMET    Component Value Date/Time   NA 135 03/13/2020 0304   K 3.6 03/13/2020 0304   CL 102 03/13/2020 0304   CO2 23 03/13/2020 0304   GLUCOSE 134 (H) 03/13/2020 0304   BUN 10 03/13/2020 0304   CREATININE 0.99 03/13/2020 0304   CALCIUM 8.8 (L) 03/13/2020 0304   GFRNONAA >60 03/13/2020 0304   GFRAA >60 03/13/2020 0304     Intake/Output Summary (Last 24 hours) at 03/13/2020 0734 Last data filed at 03/13/2020 0700 Gross per 24 hour  Intake 407.52 ml  Output 1275 ml  Net -867.48 ml    HOSPITAL MEDICATIONS Scheduled Meds: . atorvastatin  20 mg Oral Daily  . Chlorhexidine Gluconate Cloth  6 each Topical Daily  . clopidogrel  75 mg Oral Daily  . diltiazem  90 mg Oral Q6H  . docusate sodium  100 mg Oral Daily  . insulin aspart  0-15 Units Subcutaneous TID WC  . pantoprazole (PROTONIX) IV  40 mg  Intravenous Daily  . umeclidinium bromide  1 puff Inhalation Daily   Continuous Infusions: . sodium chloride Stopped (03/08/20 1008)  . bivalirudin (ANGIOMAX) infusion 0.5 mg/mL (Non-ACS indications) 0.095 mg/kg/hr (03/13/20 0700)  . sodium chloride     PRN Meds:.sodium chloride, acetaminophen **OR** acetaminophen, albuterol, alum & mag hydroxide-simeth, guaiFENesin-dextromethorphan, metoprolol tartrate, morphine injection, ondansetron, oxyCODONE, phenol, polyethylene glycol, white petrolatum  Assessment: 74 y.o. male is s/p bilateral lower extremity thrombectomy and right lower extremity fasciotomy now closed. VSS. Lower extremities well perfused. Remains on Angiomax infusion.  Plan: -Continue efforts at mobilization. Decision regarding CT surgery for atrial thrombus pending. -DVT prophylaxis:  Fully anticoagulated   Risa Grill, PA-C [facility] [phone] 03/13/2020  7:34 AM   I have seen and evaluated the patient. I agree with the PA note as documented above. S/P bilateral thombectomies of both limbs of previous EVAR with atrial thrombus.  Brisk right peroneal signal and left DP/PT signals.  Right leg fasciotomies look ok closed.  Serotonin test for HIT still pending.  Appreciate further input from CT surgery.  Marty Heck, MD [facility] [place] Office: [phone]

## 2020-03-13 NOTE — Progress Notes (Signed)
OT Treatment Note  Pt making steady progress toward goals despite painful RLE. Wife present for session. Asked nsg to order Geomat to increase ability/activity tolerance for sitting up in chair. Continue to recommend rehab at [facility] at this time. Will continue to follow acutely.     03/13/20 1300  OT Visit Information  Last OT Received On 03/13/20  Assistance Needed +2 (helpful for mobility)  History of Present Illness Pt is a 74 y.o. male with recent AAA repair 12/2019, now admitted 03/06/20 with R hip pain and leg parasthesis; s/p emergent thrombectomy of BLEs, aortobiiliac endograft, fasciotomy 7/15. S/p closure of RLE fasciotomies 7/20; intraoperative TEE Showed large lobulated mobile thrombus in L atrium. Cardiothoracic sx contemplating sternotomy, cardiopulmonry bypass and resection of LA thrombus; work-up in progress. Other PMH includes HTN, PVD, CAD, DM, asthma.  Precautions  Precautions Fall  Precaution Comments R LE incisions; faciotomies with pain  Pain Assessment  Pain Assessment 0-10  Pain Score 7  Pain Location RLE  Pain Descriptors / Indicators Aching;Discomfort;Grimacing;Guarding;Moaning  Pain Intervention(s) Limited activity within patient's tolerance;Repositioned  Cognition  Arousal/Alertness Awake/alert  Behavior During Therapy WFL for tasks assessed/performed  Overall Cognitive Status Impaired/Different from baseline  Area of Impairment Attention;Safety/judgement;Awareness  Current Attention Level Sustained  Safety/Judgement Decreased awareness of safety  Awareness Emergent  General Comments slow processing  Upper Extremity Assessment  Upper Extremity Assessment Generalized weakness  ADL  Overall ADL's  Needs assistance/impaired  Eating/Feeding Set up;Sitting  Grooming Set up;Sitting  Upper Body Bathing Set up;Sitting  Lower Body Bathing Maximal assistance;Sit to/from stand  Upper Body Dressing  Minimal assistance;Sitting  Lower Body Dressing Maximal  assistance;Sit to/from stand  Functional mobility during ADLs Moderate assistance;+2 for safety/equipment;Rolling walker;Cueing for safety;Cueing for sequencing  General ADL Comments significantly limited by pain  Bed Mobility  Overal bed mobility Needs Assistance  Supine to sit Min assist  Balance  Sitting balance-Leahy Scale Fair  Standing balance-Leahy Scale Poor  Standing balance comment heavy UE support needed  Transfers  Overall transfer level Needs assistance  Transfers Sit to/from Stand;Stand Pivot Transfers  Sit to Stand Mod assist;+2 physical assistance  Stand pivot transfers Min assist;+2 physical assistance  Exercises  Exercises General Upper Extremity  General Exercises - Upper Extremity  Shoulder Flexion Strengthening;Both;10 reps;Theraband  Shoulder Extension Strengthening;Both;10 reps;Seated;Theraband  Elbow Flexion Strengthening;Both;10 reps;Seated;Theraband  Elbow Extension Strengthening;10 reps;Seated;Theraband  Theraband Level (Shoulder Flexion) Level 2 (Red)  Theraband Level (Shoulder Extension) Level 2 (Red)  Theraband Level (Elbow Flexion) Level 2 (Red)  Theraband Level (Elbow Extension) Level 2 (Red)  OT - End of Session  Equipment Utilized During Treatment Gait belt;Rolling walker  Activity Tolerance Patient limited by pain  Patient left in chair;with call bell/phone within reach;with chair alarm set;with family/visitor present  Nurse Communication Mobility status  OT Assessment/Plan  OT Plan Discharge plan remains appropriate  OT Visit Diagnosis Other abnormalities of gait and mobility (R26.89);Muscle weakness (generalized) (M62.81);Pain;Unsteadiness on feet (R26.81)  Pain - Right/Left Right  Pain - part of body Leg  OT Frequency (ACUTE ONLY) Min 2X/week  Recommendations for Other Services Rehab consult  Follow Up Recommendations CIR;Supervision/Assistance - 24 hour  OT Equipment 3 in 1 bedside commode  AM-PAC OT "6 Clicks" Daily Activity Outcome  Measure (Version 2)  Help from another person eating meals? 3  Help from another person taking care of personal grooming? 3  Help from another person toileting, which includes using toliet, bedpan, or urinal? 2  Help from another person bathing (including washing, rinsing,  drying)? 2  Help from another person to put on and taking off regular upper body clothing? 3  Help from another person to put on and taking off regular lower body clothing? 2  6 Click Score 15  OT Goal Progression  Progress towards OT goals Progressing toward goals  Acute Rehab OT Goals  Patient Stated Goal to get stronger  OT Goal Formulation With patient/family  Time For Goal Achievement 03/24/20  Potential to Achieve Goals Good  ADL Goals  Pt Will Perform Grooming with min guard assist;standing  Pt Will Perform Lower Body Bathing with min guard assist;with adaptive equipment;sit to/from stand  Pt Will Perform Lower Body Dressing with min guard assist;with adaptive equipment;sit to/from stand  Pt Will Transfer to Toilet with min guard assist;ambulating;bedside commode  Pt Will Perform Toileting - Clothing Manipulation and hygiene with min guard assist;sit to/from stand  Additional ADL Goal #1 Pt will be Mod I in and OOB for basic ADLs  OT Time Calculation  OT Start Time (ACUTE ONLY) 1133  OT Stop Time (ACUTE ONLY) 1156  OT Time Calculation (min) 23 min  OT General Charges  $OT Visit 1 Visit  OT Treatments  $Self Care/Home Management  23-37 mins  Maurie Boettcher, OT/L   Acute OT Clinical Specialist [place] Pager [phone] Office [phone]

## 2020-03-13 NOTE — NC FL2 (Signed)
[place] LEVEL OF CARE SCREENING TOOL     IDENTIFICATION  Patient Name: [name] Birthdate: [birth year] Sex: male Admission Date (Current Location): 03/06/2020  [facility] and [place] Number:  Herbalist and Address:  [facility] 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   Vascular occlusion    Femoral-tibial bypass graft occlusion, 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/06/2020   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   AAA (abdominal aortic aneurysm) without rupture ([place]) 01/15/2020   Tobacco abuse 06/02/2017   CAD (coronary artery disease) 07/12/2016   Bradycardia 07/06/2016   Chest pain with high risk for cardiac etiology 07/03/2016   Hypokalemia 07/03/2016   DM (diabetes mellitus) ([place]) 07/03/2016   Essential hypertension 07/03/2016   Gross hematuria 07/03/2016    Orientation RESPIRATION BLADDER Height & Weight     Self, Time, Situation, Place  Normal Incontinent Weight: 198 lb 13.7 oz (90.2 kg) Height:  6\' 3"  (190.5 cm)  BEHAVIORAL SYMPTOMS/MOOD NEUROLOGICAL BOWEL NUTRITION STATUS      Continent Diet (see discharge summary)  AMBULATORY STATUS COMMUNICATION OF NEEDS Skin   Extensive Assist Verbally PU Stage and Appropriate Care, Skin abrasions, Other (Comment) (PU stage 3 buttocks; closed incision on L & R groin; closed incision on R leg)                       Personal Care Assistance Level of Assistance   Bathing, Feeding, Dressing Bathing Assistance: Maximum assistance Feeding assistance: Independent Dressing Assistance: Maximum assistance     Functional Limitations Info  Sight, Speech, Hearing Sight Info: Adequate Hearing Info: Adequate Speech Info: Adequate    SPECIAL CARE FACTORS FREQUENCY  OT (By licensed OT), PT (By licensed PT)     PT Frequency: 5x week OT Frequency: 5x week            Contractures Contractures Info: Not present    Additional Factors Info  Code Status, Allergies, Insulin Sliding Scale Code Status Info: Full Code Allergies Info: Erythromycin, Simvastatin, Statins   Insulin Sliding Scale Info: insulin aspart (novoLOG) injection 0-15 Units 3x daily with meals       Current Medications (03/13/2020):  This is the current hospital active medication list Current Facility-Administered Medications  Medication Dose Route Frequency Provider Last Rate Last Admin   0.9 %  sodium chloride infusion   Intravenous PRN Dagoberto Ligas, PA-C   Stopped at 03/08/20 1008   acetaminophen (TYLENOL) tablet 325-650 mg  325-650 mg Oral Q4H PRN Dagoberto Ligas, PA-C   650 mg at 03/12/20 1817   Or   acetaminophen (TYLENOL) suppository 325-650 mg  325-650 mg Rectal Q4H PRN Dagoberto Ligas, PA-C       albuterol (PROVENTIL) (2.5 MG/3ML) 0.083% nebulizer solution 2.5 mg  2.5 mg Nebulization Q4H PRN Dagoberto Ligas, PA-C   2.5 mg at 03/13/20 1654   alum & mag hydroxide-simeth (MAALOX/MYLANTA) 200-200-20 MG/5ML suspension 15-30 mL  15-30  mL Oral Q2H PRN Dagoberto Ligas, PA-C       atorvastatin (LIPITOR) tablet 20 mg  20 mg Oral Daily Dagoberto Ligas, PA-C   20 mg at 03/13/20 0942   bivalirudin (ANGIOMAX) 250 mg in sodium chloride 0.9 % 500 mL (0.5 mg/mL) infusion  0.095 mg/kg/hr Intravenous Continuous Dagoberto Ligas, PA-C 17.6 mL/hr at 03/13/20 0921 0.095 mg/kg/hr at 03/13/20 5956   Chlorhexidine Gluconate Cloth 2 % PADS 6 each  6 each Topical Daily Dagoberto Ligas,  PA-C   6 each at 03/12/20 2359   clopidogrel (PLAVIX) tablet 75 mg  75 mg Oral Daily Dagoberto Ligas, PA-C   75 mg at 03/13/20 3875   diltiazem (CARDIZEM) tablet 90 mg  90 mg Oral Q6H Arrien, Jimmy Picket, MD   90 mg at 03/13/20 0618   docusate sodium (COLACE) capsule 100 mg  100 mg Oral Daily Dagoberto Ligas, PA-C   100 mg at 03/13/20 6433   fluconazole (DIFLUCAN) tablet 100 mg  100 mg Oral Daily Jonetta Osgood, MD   100 mg at 03/13/20 2951   Gerhardt's butt cream   Topical TID Jonetta Osgood, MD   Given at 03/13/20 1648   guaiFENesin-dextromethorphan (ROBITUSSIN DM) 100-10 MG/5ML syrup 15 mL  15 mL Oral Q4H PRN Dagoberto Ligas, PA-C       insulin aspart (novoLOG) injection 0-15 Units  0-15 Units Subcutaneous TID WC Dagoberto Ligas, PA-C   2 Units at 03/13/20 0734   metoprolol tartrate (LOPRESSOR) injection 2-5 mg  2-5 mg Intravenous Q2H PRN Dagoberto Ligas, PA-C   5 mg at 03/11/20 1307   morphine 2 MG/ML injection 2-5 mg  2-5 mg Intravenous Q1H PRN Dagoberto Ligas, PA-C   2 mg at 03/12/20 1650   ondansetron (ZOFRAN) injection 4 mg  4 mg Intravenous Q6H PRN Dagoberto Ligas, PA-C       oxyCODONE (Oxy IR/ROXICODONE) immediate release tablet 5-10 mg  5-10 mg Oral Q4H PRN Dagoberto Ligas, PA-C   10 mg at 03/13/20 0849   pantoprazole (PROTONIX) injection 40 mg  40 mg Intravenous Daily Dagoberto Ligas, PA-C   40 mg at 03/13/20 0939   phenol (CHLORASEPTIC) mouth spray 1 spray  1 spray Mouth/Throat PRN Dagoberto Ligas, PA-C   1 spray at 03/07/20 0419   polyethylene glycol (MIRALAX / GLYCOLAX) packet 17 g  17 g Oral Daily PRN Dagoberto Ligas, PA-C       sodium chloride 0.9 % bolus 1,000 mL  1,000 mL Intravenous Once Dagoberto Ligas, PA-C       umeclidinium bromide (INCRUSE ELLIPTA) 62.5 MCG/INH 1 puff  1 puff Inhalation Daily Dagoberto Ligas, PA-C   1 puff at 03/13/20 1646   white petrolatum (VASELINE) gel   Topical PRN Dagoberto Ligas, PA-C   0.2 application at  88/41/66 0228     Discharge Medications: Please see discharge summary for a list of discharge medications.  Relevant Imaging Results:  Relevant Lab Results:   Additional Information [id number] [place], [place]

## 2020-03-13 NOTE — Progress Notes (Signed)
[place] for Bivalirudin  Indication: LA thrombus, occluded aortic stent graft, s/p OR   Allergies  Allergen Reactions  . Erythromycin Shortness Of Breath and Swelling  . Simvastatin Other (See Comments)    CAUSE MUSCLE PAIN,.  . Statins Other (See Comments)    Muscle pain    Patient Measurements: Height: 6\' 3"  (190.5 cm) Weight: 90.2 kg (198 lb 13.7 oz) IBW/kg (Calculated) : 84.5 HEPARIN DW (KG): 79 kg  Vital Signs: Temp: 97.9 F (36.6 C) (07/22 0801) Temp Source: Oral (07/22 0801) BP: 117/82 (07/22 0801) Pulse Rate: 74 (07/22 0700)  Labs: Recent Labs    03/11/20 0318 03/11/20 0318 03/11/20 2017 03/12/20 0251 03/13/20 0304  HGB 7.2*   < >  --  8.6* 8.6*  HCT 22.7*  --   --  26.7* 27.3*  PLT 100*  --   --  125* 139*  APTT 62*   < > 62* 62* 62*  CREATININE 0.93  --   --  1.06 0.99   < > = values in this interval not displayed.    Estimated Creatinine Clearance: 79.4 mL/min (by C-G formula based on SCr of 0.99 mg/dL).   Assessment:  74 y.o. 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 Patient was taken to OR for thrombectomy and intraop TEE revealed LA thrombus. Pharmacy initially consulted for IV heparin dosing, but was changed to bivalirudin for possible HIT. 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 He is s/p fasciotomy closure 7/20 and possible surgical evacuation of thrombus next week. -aPTT at goal -hg= 8.6, plt= 125 -SRA in-process. I called [facility] 7/22. Results may be available later today or tomorrow (turn around is 3-7 days)   Goal of Therapy:  aPTT 50-85 secs Monitor platelets by anticoagulation protocol: Yes  Plan:  Continue bivalirudin to 0.095 mg/kg/hr Monitor daily aPTT, CBC, s/sx bleeding F/u HIT SRA  results  Hildred Laser, PharmD Clinical Pharmacist **Pharmacist phone directory can now be found on [email or web address] ([email or web address]).  Listed under [place].

## 2020-03-13 NOTE — Progress Notes (Signed)
Patient ID: [name], male   DOB: 1945-09-06, 74 y.o.   MRN: [id number] Called by Radiologist with CT chest report.  CT shows: 1. 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 2. 3.6 cm x 2.4 cm area of low attenuation within the anterior aspect of the right atrium and adjacent portion of the left ventricle. While this is likely consistent with vascular invasion of the previously noted lung mass, a component of associated thrombus cannot be excluded. 3. Evidence of prior stenting of the infrarenal abdominal aorta.  Pt is being followed by CT surgery.  Called Dr. Orvan Seen with CT surgery to update him on CT results.   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

## 2020-03-13 NOTE — Progress Notes (Signed)
Physical Therapy Treatment Patient Details Name: [name] MRN: [id number] DOB: 02/22/1946 Today's Date: 03/13/2020    History of Present Illness Pt is a 74 y.o. male with recent AAA repair 12/2019, now admitted 03/06/20 with R hip pain and leg parasthesis; s/p emergent thrombectomy of BLEs, aortobiiliac endograft, fasciotomy 7/15. S/p closure of RLE fasciotomies 7/20; intraoperative TEE Showed large lobulated mobile thrombus in L atrium. Cardiothoracic sx contemplating sternotomy, cardiopulmonry bypass and resection of LA thrombus; work-up in progress. Other PMH includes HTN, PVD, CAD, DM, asthma.    PT Comments    Pt reports very sore sacrum as well as painful RLE and bil groin. Pt able to get OOB and walk very short distance 4' and 2' in room limited by pain. RN provided medicine and will plan to premedicate for success in future sessions. Pt educated for HEP, progression and need for signficant improvement in mobility to consider return home. Pt will need to achieve minguard level if planning to return home and until then SNF recommended at present.   HR 76 98% on RA 117/72    Follow Up Recommendations  SNF;Supervision/Assistance - 24 hour (SNF)     Equipment Recommendations  Rolling walker with 5" wheels    Recommendations for Other Services       Precautions / Restrictions Precautions Precautions: Fall Precaution Comments: R LE incisions with pain    Mobility  Bed Mobility Overal bed mobility: Needs Assistance Bed Mobility: Supine to Sit     Supine to sit: Mod assist     General bed mobility comments: assist to move RLE off bed and elevate trunk  Transfers Overall transfer level: Needs assistance   Transfers: Sit to/from Stand Sit to Stand: Mod assist;+2 safety/equipment         General transfer comment: performed 2 standing trials with decreased assist on 2nd. cues for hand placement, sequence, safety assist to rise with decreased weight on  RLE  Ambulation/Gait Ambulation/Gait assistance: Min assist;+2 safety/equipment Gait Distance (Feet): 4 Feet Assistive device: Rolling walker (2 wheeled) Gait Pattern/deviations: Step-to pattern;Decreased stride length;Trunk flexed   Gait velocity interpretation: <1.8 ft/sec, indicate of risk for recurrent falls General Gait Details: chair follow with pt able to walk 4' then 2'. limited by pain   Stairs             Wheelchair Mobility    Modified Rankin (Stroke Patients Only)       Balance Overall balance assessment: Needs assistance Sitting-balance support: Bilateral upper extremity supported;Feet supported Sitting balance-Leahy Scale: Poor Sitting balance - Comments: pt able to sit without and with UE support, uncomfortable and shifting with sore sacrum   Standing balance support: Bilateral upper extremity supported Standing balance-Leahy Scale: Poor Standing balance comment: heavy UE support needed                            Cognition Arousal/Alertness: Awake/alert Behavior During Therapy: WFL for tasks assessed/performed Overall Cognitive Status: Within Functional Limits for tasks assessed                                        Exercises General Exercises - Lower Extremity Long Arc QuadSinclair Ship;Right;Seated;10 reps Hip Flexion/Marching: 10 reps;AROM;AAROM;Right;Left;Seated (AAROM on RLE)    General Comments        Pertinent Vitals/Pain Pain Score: 6  Pain Location: RLE  and bil  groin with movement and standing Pain Descriptors / Indicators: Sore;Sharp;Constant Pain Intervention(s): Limited activity within patient's tolerance;Repositioned;Monitored during session    Home Living                      Prior Function            PT Goals (current goals can now be found in the care plan section) Progress towards PT goals: Progressing toward goals    Frequency    Min 3X/week      PT Plan Discharge plan needs  to be updated    Co-evaluation              AM-PAC PT "6 Clicks" Mobility   Outcome Measure  Help needed turning from your back to your side while in a flat bed without using bedrails?: A Little Help needed moving from lying on your back to sitting on the side of a flat bed without using bedrails?: A Lot Help needed moving to and from a bed to a chair (including a wheelchair)?: A Lot Help needed standing up from a chair using your arms (e.g., wheelchair or bedside chair)?: A Lot Help needed to walk in hospital room?: A Lot Help needed climbing 3-5 steps with a railing? : Total 6 Click Score: 12    End of Session Equipment Utilized During Treatment: Gait belt Activity Tolerance: Patient limited by pain Patient left: in chair;with call bell/phone within reach;with nursing/sitter in room;with chair alarm set Nurse Communication: Mobility status PT Visit Diagnosis: Other abnormalities of gait and mobility (R26.89);Pain;Difficulty in walking, not elsewhere classified (R26.2);Muscle weakness (generalized) (M62.81)     Time: 0829-0900 PT Time Calculation (min) (ACUTE ONLY): 31 min  Charges:  $Gait Training: 8-22 mins $Therapeutic Activity: 8-22 mins                     Bibi Economos P, PT Acute Rehabilitation Services Pager: [phone] Office: [place] 03/13/2020, 12:01 PM

## 2020-03-13 NOTE — Plan of Care (Signed)
  Problem: Clinical Measurements: Goal: Ability to maintain clinical measurements within normal limits will improve Outcome: Progressing Goal: Respiratory complications will improve Outcome: Progressing   Problem: Nutrition: Goal: Adequate nutrition will be maintained Outcome: Progressing

## 2020-03-13 NOTE — Consult Note (Signed)
[facility] Nurse Consult Note: Reason for Consult: New area of intertriginous dermatitis noted at gluteal cleft, partial thickness skin loss Wound type:moisture associated skin damage, specifically ITD Pressure Injury POA: N/A Measurement:6cm x 0.4cm x 0.2cm with red wound bed and small amount serous exudate Wound bed:As noted above Drainage (amount, consistency, odor) As noted above Periwound: intact Dressing procedure/placement/frequency: I will provide Nursing with guidance for the care of this area using 1:1:1 zinc oxide, hydrocortisone cream and lotrimin cream (Gerhart's Butt Cream). This will eb applied 3 times daily and PRN for comfort. Patient has been instructed to turn side to side and minimize time in the supine position.Recommendation:  One or two doses of PO diflucan is recommended. [place] nursing team will not follow, but will remain available to this patient, the nursing and medical teams.  Please re-consult if needed. Thanks, Maudie Flakes, MSN, RN, [place], [facility]  Pager# [phone]

## 2020-03-13 NOTE — Progress Notes (Signed)
PROGRESS NOTE        PATIENT DETAILS Name: [name] Age: 74 y.o. Sex: male Date of Birth: [birth year] Admit Date: 03/06/2020 Admitting Physician Garner Nash, DO DPO:[id number]  Brief Narrative: Patient is a 74 y.o. male with hx AAA s/p repair in 12/2019 with endovascular stent graft s/p left iliac thrombectomy and left external iliac stent, PVD, HTN, DM2, CAD,  atrial fibrillation presented with right ischemic leg-further evaluation revealed occluded right/left limb of the aorto biiliac endograft-underwent emergent thrombectomy and right lower extremity 4 compartment fasciotomy-further evaluation with a intraoperative TEE showed a mobile left atrial thrombus.  See below for further details.  Significant events: 7/15>> admit for right ischemic leg-underwent emergent thrombectomy/fasciotomy of right leg  Significant studies: 7/15>> CT angio lower extremities: Right limb of the aortic stent graft occluded 7/21>> carotid Doppler: None hemodynamically significant stenosis bilaterally 7/21>> CT coronary: Large left upper lobe mass-encasing the left hilum-occluding left upper lobe/lingula bronchi with signs of postobstructive pneumonitis in the left upper lobe-mass invades the left upper lobe pulmonary vein with tumor thrombus in the left atrium extending to the mitral valve  Antimicrobial therapy: None  Microbiology data: None  Pathology: 7/15>>Thrombus-Right iliac thrombectomy:pending  Procedures : 7/20>> intraoperative TEE: Large lobulated mobile thrombus in the left atrium 7/20>> closure of right leg fasciotomy 7/15>> intraoperative TEE: Large LA thrombus 7/15>> Thrombectomy of right limb, left limb of aorto biiliac endograft, right lower extremity 4 compartment fasciotomy  Consults: PCCM, VVS, CTVS  DVT Prophylaxis : SCDs Start: 03/06/20 1559 IV bivalirudin.  Subjective: Seen earlier this morning-was working with physical  therapy.  Assessment/Plan: Acute bilateral occlusion of aortoiliac grafts with right ischemic leg-s/p thrombectomy/4 compartment fasciotomy likely secondary to left atrial thrombus: Vascular surgery following-s/p thrombectomy-found to have a large LA thrombus (?  Tumor thrombus-see below)-remains on IV anticoagulation-CT surgery contemplating sternotomy, cardiopulmonry bypass and resection of LA thrombus-work-up in progress.  Have reached out to pathology to see if we can get a prelim report-on biopsy of thrombus from right iliac thrombectomy-awaiting call back  Left lung mass: Seen on cardiac CT-Per radiology-appears to have a large left lung mass-encasing the hilum-and invading into the pulmonary vein into the left atrium and across the mitral valve.  Have ordered a dedicated CT chest-may need PCCM input at some point for bronchoscopy/biopsy-if thrombectomy biopsy is inconclusive.  PAF: Continue telemetry monitoring-on diltiazem-on IV bivalirudin.  Thrombocytopenia: Improved-continue bivalirudin-HIT antibody negative on 7/15-SRA still pending.  Normocytic anemia: Likely secondary to critical illness-no indication of GI loss.  Follow for now.  COPD: Not in exacerbation-continue bronchodilators.  HTN: BP stable  CAD: No anginal symptoms-continue Plavix/statin  HLD: Continue statin  DM-2: CBG stable-continue SSI   Recent Labs    03/12/20 2131 03/13/20 0700 03/13/20 1124  GLUCAP 150* 144* 200*   Pressure injury: Appreciate wound care input-have initiated 2 days of Diflucan.  Pressure Injury 03/12/20 Buttocks Mid Stage 3 -  Full thickness tissue loss. Subcutaneous fat may be visible but bone, tendon or muscle are NOT exposed. (Active)  03/12/20 2000  Location: Buttocks  Location Orientation: Mid  Staging: Stage 3 -  Full thickness tissue loss. Subcutaneous fat may be visible but bone, tendon or muscle are NOT exposed.  Wound Description (Comments):   Present on Admission:      Diet: Diet Order  Diet Heart Room service appropriate? Yes; Fluid consistency: Thin  Diet effective now                  Code Status: Full code   Family Communication: Left voicemail for spouse on 7/22  Disposition Plan: Status is: Inpatient  Remains inpatient appropriate because:Inpatient level of care appropriate due to severity of illness   Dispo: The patient is from: Home              Anticipated d/c is to: TBD              Anticipated d/c date is: > 3 days              Patient currently is not medically stable to d/c.  Barriers to Discharge: Ischemic right leg-s/p emergent thrombectomy-found to have a large left atrial thrombus-CT surgery eval in progress-he may need cardiopulmonary bypass/thrombectomy  Antimicrobial agents: Anti-infectives (From admission, onward)   Start     Dose/Rate Route Frequency Ordered Stop   03/13/20 1000  fluconazole (DIFLUCAN) tablet 100 mg     Discontinue     100 mg Oral Daily 03/13/20 0858 03/15/20 0959   03/07/20 0200  ceFAZolin (ANCEF) IVPB 2g/100 mL premix        2 g 200 mL/hr over 30 Minutes Intravenous Every 8 hours 03/06/20 2112 03/07/20 1052   03/06/20 1958  ceFAZolin (ANCEF) 2-4 GM/100ML-% IVPB       Note to Pharmacy: Alba Cory   : cabinet override      03/06/20 1958 03/07/20 0759       Time spent: 35 minutes-Greater than 50% of this time was spent in counseling, explanation of diagnosis, planning of further management, and coordination of care.  MEDICATIONS: Scheduled Meds: . atorvastatin  20 mg Oral Daily  . Chlorhexidine Gluconate Cloth  6 each Topical Daily  . clopidogrel  75 mg Oral Daily  . diltiazem  90 mg Oral Q6H  . docusate sodium  100 mg Oral Daily  . fluconazole  100 mg Oral Daily  . Gerhardt's butt cream   Topical TID  . insulin aspart  0-15 Units Subcutaneous TID WC  . pantoprazole (PROTONIX) IV  40 mg Intravenous Daily  . umeclidinium bromide  1 puff Inhalation Daily   Continuous  Infusions: . sodium chloride Stopped (03/08/20 1008)  . bivalirudin (ANGIOMAX) infusion 0.5 mg/mL (Non-ACS indications) 0.095 mg/kg/hr (03/13/20 0921)  . sodium chloride     PRN Meds:.sodium chloride, acetaminophen **OR** acetaminophen, albuterol, alum & mag hydroxide-simeth, guaiFENesin-dextromethorphan, metoprolol tartrate, morphine injection, ondansetron, oxyCODONE, phenol, polyethylene glycol, white petrolatum   PHYSICAL EXAM: Vital signs: Vitals:   03/13/20 0600 03/13/20 0618 03/13/20 0700 03/13/20 0801  BP: 103/90 103/90 132/70 117/82  Pulse: 75  74   Resp: (!) 26  20   Temp:   97.8 F (36.6 C) 97.9 F (36.6 C)  TempSrc:   Oral Oral  SpO2: 97%  95%   Weight:      Height:       Filed Weights   03/11/20 0510 03/12/20 0600 03/13/20 0500  Weight: 88 kg 88.1 kg 90.2 kg   Body mass index is 24.86 kg/m.   Gen Exam:Alert awake-not in any distress HEENT:atraumatic, normocephalic Chest: B/L clear to auscultation anteriorly CVS:S1S2 regular Abdomen:soft non tender, non distended Extremities:no edema Neurology: Non focal Skin: no rash  I have personally reviewed following labs and imaging studies  LABORATORY DATA: CBC: Recent Labs  Lab 03/06/20 1417 03/06/20 1432 03/09/20  0301 03/10/20 0234 03/11/20 0318 03/12/20 0251 03/13/20 0304  WBC 9.5   < > 11.2* 11.5* 8.0 9.9 10.6*  NEUTROABS 6.6  --   --   --   --   --   --   HGB 10.9*   < > 7.9* 8.2* 7.2* 8.6* 8.6*  HCT 35.8*   < > 24.6* 25.9* 22.7* 26.7* 27.3*  MCV 89.7   < > 88.5 89.3 88.0 87.3 88.3  PLT 51*   < > 49* 69* 100* 125* 139*   < > = values in this interval not displayed.    Basic Metabolic Panel: Recent Labs  Lab 03/07/20 0459 03/07/20 1700 03/08/20 0415 03/08/20 0415 03/09/20 0301 03/10/20 0234 03/11/20 0318 03/12/20 0251 03/13/20 0304  NA 138   < > 139   < > 136 134* 135 136 135  K 4.2   < > 3.8   < > 3.9 4.3 3.7 4.0 3.6  CL 107   < > 107   < > 103 100 102 103 102  CO2 20*   < > 24   < >  23 27 24 24 23   GLUCOSE 207*   < > 162*   < > 139* 161* 158* 155* 134*  BUN 13   < > 10   < > 9 12 11 12 10   CREATININE 1.06   < > 0.98   < > 0.99 0.95 0.93 1.06 0.99  CALCIUM 8.4*   < > 8.7*   < > 9.0 8.8* 8.8* 9.0 8.8*  MG 1.6*   < > 1.8  --  2.0 1.9 2.0 1.8  --   PHOS 3.1  --   --   --   --  2.7 2.7 2.9 2.5   < > = values in this interval not displayed.    GFR: Estimated Creatinine Clearance: 79.4 mL/min (by C-G formula based on SCr of 0.99 mg/dL).  Liver Function Tests: Recent Labs  Lab 03/11/20 0318  AST 38  ALT 22  ALKPHOS 50  BILITOT 1.8*  PROT 5.9*  ALBUMIN 2.9*   No results for input(s): LIPASE, AMYLASE in the last 168 hours. No results for input(s): AMMONIA in the last 168 hours.  Coagulation Profile: Recent Labs  Lab 03/06/20 1417  INR 1.3*    Cardiac Enzymes: No results for input(s): CKTOTAL, CKMB, CKMBINDEX, TROPONINI in the last 168 hours.  BNP (last 3 results) No results for input(s): PROBNP in the last 8760 hours.  Lipid Profile: No results for input(s): CHOL, HDL, LDLCALC, TRIG, CHOLHDL, LDLDIRECT in the last 72 hours.  Thyroid Function Tests: No results for input(s): TSH, T4TOTAL, FREET4, T3FREE, THYROIDAB in the last 72 hours.  Anemia Panel: No results for input(s): VITAMINB12, FOLATE, FERRITIN, TIBC, IRON, RETICCTPCT in the last 72 hours.  Urine analysis:    Component Value Date/Time   COLORURINE YELLOW 07/02/2016 2112   APPEARANCEUR CLEAR 07/02/2016 2112   LABSPEC 1.006 07/02/2016 2112   PHURINE 5.5 07/02/2016 2112   GLUCOSEU NEGATIVE 07/02/2016 2112   HGBUR LARGE (A) 07/02/2016 2112   BILIRUBINUR NEGATIVE 07/02/2016 2112   KETONESUR NEGATIVE 07/02/2016 2112   PROTEINUR NEGATIVE 07/02/2016 2112   NITRITE NEGATIVE 07/02/2016 2112   LEUKOCYTESUR TRACE (A) 07/02/2016 2112    Sepsis Labs: Lactic Acid, Venous    Component Value Date/Time   LATICACIDVEN 2.4 ([place]) 03/06/2020 2158    MICROBIOLOGY: Recent Results (from the past 240  hour(s))  SARS Coronavirus 2 by RT PCR (hospital  order, performed in [facility] hospital lab) Nasopharyngeal Nasopharyngeal Swab     Status: None   Collection Time: 03/06/20  2:28 PM   Specimen: Nasopharyngeal Swab  Result Value Ref Range Status   SARS Coronavirus 2 NEGATIVE NEGATIVE Final    Comment: (NOTE) SARS-CoV-2 target nucleic acids are NOT DETECTED.  The SARS-CoV-2 RNA is generally detectable in upper and lower respiratory specimens during the acute phase of infection. The lowest concentration of SARS-CoV-2 viral copies this assay can detect is 250 copies / mL. A negative result does not preclude SARS-CoV-2 infection and should not be used as the sole basis for treatment or other patient management decisions.  A negative result may occur with improper specimen collection / handling, submission of specimen other than nasopharyngeal swab, presence of viral mutation(s) within the areas targeted by this assay, and inadequate number of viral copies (<250 copies / mL). A negative result must be combined with clinical observations, patient history, and epidemiological information.  Fact Sheet for Patients:   StrictlyIdeas.no  Fact Sheet for Healthcare Providers: BankingDealers.co.za  This test is not yet approved or  cleared by the [place] FDA and has been authorized for detection and/or diagnosis of SARS-CoV-2 by FDA under an Emergency Use Authorization (EUA).  This EUA will remain in effect (meaning this test can be used) for the duration of the COVID-19 declaration under Section 564(b)(1) of the Act, 21 U.S.C. section 360bbb-3(b)(1), unless the authorization is terminated or revoked sooner.  Performed at [facility], [place] [address], [place], [place] [address]   MRSA PCR Screening     Status: None   Collection Time: 03/06/20  9:38 PM   Specimen: Nasal Mucosa; Nasopharyngeal  Result Value Ref Range Status   MRSA  by PCR NEGATIVE NEGATIVE Final    Comment:        The GeneXpert MRSA Assay (FDA approved for NASAL specimens only), is one component of a comprehensive MRSA colonization surveillance program. It is not intended to diagnose MRSA infection nor to guide or monitor treatment for MRSA infections. Performed at [facility], [place] [address], [place], [place] [address]     RADIOLOGY STUDIES/RESULTS: CT CORONARY MORPH W/CTA COR W/SCORE W/CA W/CM &/OR WO/CM  Addendum Date: 03/13/2020   ADDENDUM REPORT: 03/13/2020 09:16 EXAM: OVER-READ INTERPRETATION  CT CHEST The following report is an over-read performed by radiologist Dr. Norlene Duel [facility] on 03/13/2020. This over-read does not include interpretation of cardiac or coronary anatomy or pathology. The coronary CTA and coronary calcium score interpretation by the cardiologist is attached. COMPARISON:  CTA 01/16/2020 FINDINGS: Vascular: Normal heart size.  No pericardial effusion identified. Mediastinum/nodes: Dilated and patulous esophagus. Lungs/pleura: There is a large, left upper lobe, perihilar lung mass which measures 8.9 x 5.5 cm, image 3/11. This mass encases the left hilum and occludes the left upper lobe and lingular bronchi with signs of postobstructive pneumonitis in the left upper lobe. The mass invades the left upper lobe pulmonary vein with tumor thrombus identified in the left atrium. The tumor thrombus protrudes through the mitral valve into the left ventricle. Upper abdomen: No acute abnormality. Musculoskeletal: No acute abnormality. IMPRESSION: 1. 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 This encases the left hilum and occludes the left upper lobe and lingular bronchi with signs of postobstructive pneumonitis in the left upper lobe. The mass invades the left upper lobe pulmonary vein with tumor thrombus in the left  atrium, extending through the mitral  valve. Recommend further evaluation with PET-CT. Electronically Signed   By: Kerby Moors M.D.   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 EXAM: Cardiac/Coronary  CTA TECHNIQUE: The patient was scanned on a [employer]. FINDINGS: A 100 kV prospective scan was triggered in the descending thoracic aorta at 111 HU's. Axial non-contrast 3 mm slices were carried out through the heart. The data set was analyzed on a dedicated work station and scored using the [place]. Gantry rotation speed was 250 msecs and collimation was .6 mm. No beta blockade and 0.8 mg of sl NTG was given. The 3D data set was reconstructed in 5% intervals of the 67-82 % of the R-R cycle. Diastolic phases were analyzed on a dedicated work station using MPR, MIP and VRT modes. The patient received 80 cc of contrast. Aorta: Normal size. There is moderate to severe diffuse atherosclerotic plaque with mild calcifications. No dissection. Aortic Valve:  Tri[place].  No calcifications. Coronary Arteries: Normal coronary origin. Right dominance. Coronary artery evaluation is affected by significant motion and poor vasodilation and therefore not interpretable. There are two stents in the proximal to mid RCA. 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 The thrombus is highly mobile with high risk of embolization. It is partially protruding into the left ventricle across the mitral valve and partially obstructing the flow to the left ventricle. The thrombus is only partially extending into the left atrial appendage. Dilated pulmonary artery suggestive of pulmonary hypertension. IMPRESSION: 1. Coronary Arteries: Normal coronary origin. Right dominance. Coronary  artery evaluation is affected by significant motion and poor vasodilation and therefore not interpretable. There are two stents in the proximal to mid RCA. 2. There is moderate to severe diffuse atherosclerotic plaque with mild calcifications. No dissection. 3. 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 The thrombus is highly mobile with high risk of embolization. It is partially protruding into the left ventricle across the mitral valve and partially obstructing the flow to the left ventricle. The thrombus is only partially extending into the left atrial appendage. 4. Dilated pulmonary artery suggestive of pulmonary hypertension. Electronically Signed: By: Ena Dawley On: 03/12/2020 18:57   VAS US CAROTID  Result Date: 03/12/2020 Carotid Arterial Duplex Study Indications:       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 Risk Factors:      Hypertension, Diabetes, coronary artery disease. Comparison Study:  No prior studies. Performing Technologist: Darlin Coco  Examination Guidelines: A complete evaluation includes B-mode imaging, spectral Doppler, color Doppler, and power Doppler as needed of all accessible portions of each vessel. Bilateral testing is considered an integral part of a complete examination. Limited examinations for reoccurring indications may be performed as noted.  Right Carotid Findings: +----------+--------+--------+--------+-----------------------+--------+           PSV cm/sEDV cm/sStenosisPlaque Description     Comments +----------+--------+--------+--------+-----------------------+--------+ CCA Prox  63      12              heterogenous and smooth         +----------+--------+--------+--------+-----------------------+--------+  CCA Mid   100     21              heterogenous and smooth          +----------+--------+--------+--------+-----------------------+--------+ CCA Distal80      13              heterogenous and smooth         +----------+--------+--------+--------+-----------------------+--------+ ICA Prox  47      13      1-39%   heterogenous                    +----------+--------+--------+--------+-----------------------+--------+ ICA Distal52      20                                              +----------+--------+--------+--------+-----------------------+--------+ ECA       51      9                                               +----------+--------+--------+--------+-----------------------+--------+ +----------+--------+-------+----------------+-------------------+           PSV cm/sEDV cmsDescribe        Arm Pressure (mmHG) +----------+--------+-------+----------------+-------------------+ Subclavian               Multiphasic, WNL                    +----------+--------+-------+----------------+-------------------+ +---------+--------+--+--------+--+---------+ VertebralPSV cm/s73EDV cm/s22Antegrade +---------+--------+--+--------+--+---------+  Left Carotid Findings: +----------+--------+--------+--------+-----------------------+--------+           PSV cm/sEDV cm/sStenosisPlaque Description     Comments +----------+--------+--------+--------+-----------------------+--------+ CCA Prox  97      20              heterogenous                    +----------+--------+--------+--------+-----------------------+--------+ CCA Mid   93      19              heterogenous and smooth         +----------+--------+--------+--------+-----------------------+--------+ CCA Distal104     25              heterogenous and smooth         +----------+--------+--------+--------+-----------------------+--------+ ICA Prox  59      10      1-39%   heterogenous                     +----------+--------+--------+--------+-----------------------+--------+ ICA Distal92      34                                              +----------+--------+--------+--------+-----------------------+--------+ ECA       97      13                                              +----------+--------+--------+--------+-----------------------+--------+ +----------+--------+--------+----------------+-------------------+           PSV cm/sEDV cm/sDescribe  Arm Pressure (mmHG) +----------+--------+--------+----------------+-------------------+ Subclavian110             Multiphasic, WNL                    +----------+--------+--------+----------------+-------------------+ +---------+--------+--+--------+--+---------+ VertebralPSV cm/s93EDV cm/s29Antegrade +---------+--------+--+--------+--+---------+   Summary: Right Carotid: Velocities in the right ICA are consistent with a 1-39% stenosis.                Non-hemodynamically significant plaque <50% noted in the CCA. Left Carotid: Velocities in the left ICA are consistent with a 1-39% stenosis.               Non-hemodynamically significant plaque <50% noted in the CCA.  *See table(s) above for measurements and observations.  Electronically signed by Ruta Hinds MD on 03/12/2020 at 6:14:00 PM.    Final      LOS: 7 days   Oren Binet, MD  [facility]    To contact the attending provider between 7A-7P or the covering provider during after hours 7P-7A, please log into the web site [email or web address] and access using universal [place] password for that web site. If you do not have the password, please call the hospital operator.  03/13/2020, 1:14 PM

## 2020-03-14 DIAGNOSIS — I236 Thrombosis of atrium, auricular appendage, and ventricle as current complications following acute myocardial infarction: Secondary | ICD-10-CM

## 2020-03-14 DIAGNOSIS — I998 Other disorder of circulatory system: Secondary | ICD-10-CM | POA: Diagnosis not present

## 2020-03-14 DIAGNOSIS — I513 Intracardiac thrombosis, not elsewhere classified: Secondary | ICD-10-CM | POA: Diagnosis not present

## 2020-03-14 DIAGNOSIS — I480 Paroxysmal atrial fibrillation: Secondary | ICD-10-CM | POA: Diagnosis not present

## 2020-03-14 DIAGNOSIS — I10 Essential (primary) hypertension: Secondary | ICD-10-CM | POA: Diagnosis not present

## 2020-03-14 LAB — CBC
HCT: 26.7 % — ABNORMAL LOW (ref 39.0–52.0)
Hemoglobin: 8.4 g/dL — ABNORMAL LOW (ref 13.0–17.0)
MCH: 27.5 pg (ref 26.0–34.0)
MCHC: 31.5 g/dL (ref 30.0–36.0)
MCV: 87.5 fL (ref 80.0–100.0)
Platelets: 147 10*3/uL — ABNORMAL LOW (ref 150–400)
RBC: 3.05 MIL/uL — ABNORMAL LOW (ref 4.22–5.81)
RDW: 13.8 % (ref 11.5–15.5)
WBC: 9.2 10*3/uL (ref 4.0–10.5)
nRBC: 0 % (ref 0.0–0.2)

## 2020-03-14 LAB — GLUCOSE, CAPILLARY
Glucose-Capillary: 149 mg/dL — ABNORMAL HIGH (ref 70–99)
Glucose-Capillary: 147 mg/dL — ABNORMAL HIGH (ref 70–99)
Glucose-Capillary: 191 mg/dL — ABNORMAL HIGH (ref 70–99)
Glucose-Capillary: 187 mg/dL — ABNORMAL HIGH (ref 70–99)

## 2020-03-14 LAB — SEROTONIN RELEASE ASSAY (SRA)
SRA .2 IU/mL UFH Ser-aCnc: <1 % (ref 0–20)
SRA 100IU/mL UFH Ser-aCnc: <1 % (ref 0–20)

## 2020-03-14 LAB — APTT: aPTT: 59 seconds — ABNORMAL HIGH (ref 24–36)

## 2020-03-14 NOTE — Progress Notes (Signed)
Patient ID: [name], male   DOB: 1946/02/14, 74 y.o.   MRN: [id number]        [address]       [place], [address]             [phone]                 3 Days Post-Op Procedure(s) (LRB): RIGHT LOWER EXTREMITY FASCIOTOMY WASHOUT AND CLOSURE (Right) TRANSESOPHAGEAL ECHOCARDIOGRAM (TEE)  LOS: 8 days   Subjective: Patient seen for Dr. Darcey Nora.  He is now in stepdown.  Was being considered for surgical removal of left atrial clot.  Patient and his wife give the history that he was diagnosed in October 2019 with squamous cell carcinoma of the lung by bronchoscopy biopsy at the [place] [facility] in [place].  The wife confirms that the patient had declined treatment is.  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 versus chondrosarcoma.-Further studies are still pending      Objective: Vital signs in last 24 hours: Patient Vitals for the past 24 hrs:  BP Temp Temp src Pulse Resp SpO2 Weight  03/14/20 1124 (!) 135/78 98.7 F (37.1 C) Oral 68 14 98 % --  03/14/20 0748 122/77 97.8 F (36.6 C) Oral -- 15 97 % --  03/14/20 0434 -- -- -- -- -- -- 88.7 kg  03/14/20 0353 126/77 98.4 F (36.9 C) Oral 66 14 94 % --  03/13/20 2300 117/71 98.9 F (37.2 C) Oral 67 19 98 % --  03/13/20 1939 113/78 98.8 F (37.1 C) Oral 77 17 96 % --  03/13/20 1526 127/69 (!) 97.4 F (36.3 C) Oral 74 19 99 % --    Filed Weights   03/12/20 0600 03/13/20 0500 03/14/20 0434  Weight: 88.1 kg 90.2 kg 88.7 kg    Hemodynamic parameters for last 24 hours:    Intake/Output from previous day: 07/22 0701 - 07/23 0700 In: -  Out: 300 [Urine:300] Intake/Output this shift: Total I/O In: -  Out: 500 [Urine:500]  Scheduled Meds: . atorvastatin  20 mg Oral Daily  . Chlorhexidine Gluconate Cloth  6 each Topical Daily  . clopidogrel  75 mg Oral Daily  .  diltiazem  90 mg Oral Q6H  . docusate sodium  100 mg Oral Daily  . Shayanna Thatch's butt cream   Topical TID  . insulin aspart  0-15 Units Subcutaneous TID WC  . pantoprazole (PROTONIX) IV  40 mg Intravenous Daily  . umeclidinium bromide  1 puff Inhalation Daily   Continuous Infusions: . sodium chloride Stopped (03/08/20 1008)  . bivalirudin (ANGIOMAX) infusion 0.5 mg/mL (Non-ACS indications) 0.095 mg/kg/hr (03/13/20 0921)  . sodium chloride     PRN Meds:.sodium chloride, acetaminophen **OR** acetaminophen, albuterol, alum & mag hydroxide-simeth, guaiFENesin-dextromethorphan, metoprolol tartrate, morphine injection, ondansetron, oxyCODONE, phenol, polyethylene glycol, white petrolatum  General appearance: alert, cooperative and no distress Neurologic: intact Heart: regular rate and rhythm, S1, S2 normal, no murmur, click, rub or gallop Lungs: diminished breath sounds bibasilar Abdomen: soft, non-tender; bowel sounds normal; no masses,  no organomegaly Groin incision is intact feet viable  lab Results: CBC: Recent Labs    03/13/20 0304 03/14/20 0830  WBC 10.6* 9.2  HGB 8.6* 8.4*  HCT 27.3* 26.7*  PLT 139* 147*   BMET:  Recent Labs    03/12/20 0251 03/13/20 0304  NA 136 135  K 4.0 3.6  CL 103 102  CO2 24 23  GLUCOSE 155* 134*  BUN 12 10  CREATININE 1.06 0.99  CALCIUM 9.0 8.8*    PT/INR: No results for input(s): LABPROT, INR in the last 72 hours.   Radiology CT CHEST W CONTRAST  Addendum Date: 03/14/2020   ADDENDUM REPORT: 03/14/2020 13:45 ADDENDUM: Thoracic surgeon Dr. Servando Snare called for a second opinion regarding the location of the cardiac mass. The irregular soft tissue mass within the left atrial appendage, left superior pulmonary vein and left aspect of the left atrium extending to mitral valve and potentially to the entrance of the left ventricle is contiguous with the large central left upper lobe lung mass and is favored to represent direct tumor invasion of these  structures. While a component of bland thrombus is possible at the periphery of this cardiac mass, this is felt to largely represent direct cardiac tumor invasion. These results were discussed by telephone by Dr. Polly Cobia at 03/14/2020 at 1:44 pm with Dr. Servando Snare, who verbally acknowledged these results. Electronically Signed   By: Ilona Sorrel M.D.   On: 03/14/2020 13:45   Result Date: 03/14/2020 CLINICAL DATA:  Abnormal x-ray findings. EXAM: CT CHEST WITH CONTRAST TECHNIQUE: Multidetector CT imaging of the chest was performed during intravenous contrast administration. CONTRAST:  9mL OMNIPAQUE IOHEXOL 300 MG/ML  SOLN COMPARISON:  March 12, 2020 FINDINGS: Cardiovascular: The pulmonary arteries are limited in evaluation secondary to suboptimal opacification with intravenous contrast. Normal heart size. A 3.6 cm x 2.4 cm area of low attenuation is seen within the anterior aspect of the right atrium. This extends from a large left suprahilar mass. Mild involvement of the left atrium is noted. No pericardial effusion. Mediastinum/Nodes: No enlarged mediastinal, hilar, or axillary lymph nodes. Thyroid gland, trachea, and esophagus demonstrate no significant findings. Lungs/Pleura: There is a 10.5 cm x 5.7 cm lobulated soft tissue mass seen along the left hilar and left suprahilar regions. There is no evidence of acute infiltrate, pleural effusion or pneumothorax. Upper Abdomen: There is evidence of prior stenting of the infrarenal abdominal aorta. Musculoskeletal: No chest wall abnormality. No acute or significant osseous findings. IMPRESSION: 1. 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 2. 3.6 cm x 2.4 cm area of low attenuation within the anterior aspect of the right atrium and adjacent portion of the left ventricle. While this is likely consistent with vascular invasion of the previously noted lung mass, a component of associated thrombus cannot be excluded. 3. Evidence of  prior stenting of the infrarenal abdominal aorta. Electronically Signed: By: Virgina Norfolk M.D. On: 03/13/2020 19:05   CT CORONARY MORPH W/CTA COR W/SCORE W/CA W/CM &/OR WO/CM  Addendum Date: 03/13/2020   ADDENDUM REPORT: 03/13/2020 09:16 EXAM: OVER-READ INTERPRETATION  CT CHEST The following report is an over-read performed by radiologist Dr. Norlene Duel [facility] on 03/13/2020. This over-read does not include interpretation of cardiac or coronary anatomy or pathology. The coronary CTA and coronary calcium score interpretation by the cardiologist is attached. COMPARISON:  CTA 01/16/2020 FINDINGS: Vascular: Normal heart size.  No pericardial effusion identified. Mediastinum/nodes: Dilated and patulous esophagus. Lungs/pleura: There is a large, left upper lobe, perihilar lung mass which measures 8.9 x 5.5 cm, image 3/11. This mass encases the left hilum and occludes the left upper lobe and lingular bronchi with signs of postobstructive pneumonitis in the left upper lobe. The mass invades the left upper lobe pulmonary  vein with tumor thrombus identified in the left atrium. The tumor thrombus protrudes through the mitral valve into the left ventricle. Upper abdomen: No acute abnormality. Musculoskeletal: No acute abnormality. IMPRESSION: 1. 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 This encases the left hilum and occludes the left upper lobe and lingular bronchi with signs of postobstructive pneumonitis in the left upper lobe. The mass invades the left upper lobe pulmonary vein with tumor thrombus in the left atrium, extending through the mitral valve. Recommend further evaluation with PET-CT. Electronically Signed   By: Kerby Moors M.D.   On: 03/13/2020 09:16   Result Date: 03/13/2020 CLINICAL DATA:  74 year old male with h/o CAD s/p PCI to RCA in 2006, PAD, s/p EVAR with left EIA stent and finding of a large left atrial  thrombus. EXAM: Cardiac/Coronary  CTA TECHNIQUE: The patient was scanned on a [employer]. FINDINGS: A 100 kV prospective scan was triggered in the descending thoracic aorta at 111 HU's. Axial non-contrast 3 mm slices were carried out through the heart. The data set was analyzed on a dedicated work station and scored using the [place]. Gantry rotation speed was 250 msecs and collimation was .6 mm. No beta blockade and 0.8 mg of sl NTG was given. The 3D data set was reconstructed in 5% intervals of the 67-82 % of the R-R cycle. Diastolic phases were analyzed on a dedicated work station using MPR, MIP and VRT modes. The patient received 80 cc of contrast. Aorta: Normal size. There is moderate to severe diffuse atherosclerotic plaque with mild calcifications. No dissection. Aortic Valve:  Trileaflet.  No calcifications. Coronary Arteries: Normal coronary origin. Right dominance. Coronary artery evaluation is affected by significant motion and poor vasodilation and therefore not interpretable. There are two stents in the proximal to mid RCA. 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 The thrombus is highly mobile with high risk of embolization. It is partially protruding into the left ventricle across the mitral valve and partially obstructing the flow to the left ventricle. The thrombus is only partially extending into the left atrial appendage. Dilated pulmonary artery suggestive of pulmonary hypertension. IMPRESSION: 1. Coronary Arteries: Normal coronary origin. Right dominance. Coronary artery evaluation is affected by significant motion and poor vasodilation and therefore not interpretable. There are two stents in the proximal to mid RCA. 2. There is moderate to severe diffuse atherosclerotic plaque with mild calcifications. No dissection. 3. 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 The thrombus is highly mobile with high risk of embolization. It is partially protruding into the left ventricle across the mitral valve and partially obstructing the flow to the left ventricle. The thrombus is only partially extending into the left atrial appendage. 4. Dilated pulmonary artery suggestive of pulmonary hypertension. Electronically Signed: By: Ena Dawley On: 03/12/2020 18:57     Assessment/Plan: S/P Procedure(s) (LRB): RIGHT LOWER EXTREMITY FASCIOTOMY WASHOUT AND CLOSURE (Right) TRANSESOPHAGEAL ECHOCARDIOGRAM (TEE) On review of this patient's TEE, cardiac CT, chest CT-the patient has a large left hilar mass with mediastinal invasion into the left atrium, with embolization of tumor to the lower extremities.  The final path is pending on this, hope there will  be enough tissue to allow molecular testing.  I have discussed with Dr. Darcey Nora,  will not proceed with idea of cardiac catheterization as a preop study prior to surgical removal of "left atrial clot".    Grace Isaac MD 03/14/2020 2:48 PM

## 2020-03-14 NOTE — Progress Notes (Signed)
[place] for Bivalirudin  Indication: LA thrombus, occluded aortic stent graft, s/p OR   Allergies  Allergen Reactions  . Erythromycin Shortness Of Breath and Swelling  . Simvastatin Other (See Comments)    CAUSE MUSCLE PAIN,.  . Statins Other (See Comments)    Muscle pain    Patient Measurements: Height: 6\' 3"  (190.5 cm) Weight: 88.7 kg (195 lb 8.8 oz) IBW/kg (Calculated) : 84.5 HEPARIN DW (KG): 79 kg  Vital Signs: Temp: 97.8 F (36.6 C) (07/23 0748) Temp Source: Oral (07/23 0748) BP: 122/77 (07/23 0748) Pulse Rate: 66 (07/23 0353)  Labs: Recent Labs    03/12/20 0251 03/12/20 0251 03/13/20 0304 03/14/20 0229 03/14/20 0830  HGB 8.6*   < > 8.6*  --  8.4*  HCT 26.7*  --  27.3*  --  26.7*  PLT 125*  --  139*  --  147*  APTT 62*  --  62* 59*  --   CREATININE 1.06  --  0.99  --   --    < > = values in this interval not displayed.    Estimated Creatinine Clearance: 79.4 mL/min (by C-G formula based on SCr of 0.99 mg/dL).   Assessment:  74 y.o. 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 Patient was taken to OR for thrombectomy and intraop TEE revealed LA thrombus. Pharmacy initially consulted for IV heparin dosing, but was changed to bivalirudin for possible HIT. 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 He is s/p fasciotomy closure 7/20 and possible surgical evacuation of thrombus next week. -aPTT at goal -hg= 8.6, plt= 147 -SRA in-process. I called [facility] 7/22. Results may be available later today or tomorrow (turn around is 3-7 days)   Goal of Therapy:  aPTT 50-85 secs Monitor platelets by anticoagulation protocol: Yes  Plan:  Continue bivalirudin to 0.095 mg/kg/hr Monitor daily aPTT, CBC, s/sx bleeding F/u HIT SRA  results  Hildred Laser, PharmD Clinical Pharmacist **Pharmacist phone directory can now be found on [email or web address] ([email or web address]).  Listed under [place].

## 2020-03-14 NOTE — Progress Notes (Signed)
PROGRESS NOTE        PATIENT DETAILS Name: [name] Age: 74 y.o. Sex: male Date of Birth: 08-16-1946 Admit Date: 03/06/2020 Admitting Physician Garner Nash, DO [facility]  Brief Narrative: Patient is a 74 y.o. male with hx AAA s/p repair in 12/2019 with endovascular stent graft s/p left iliac thrombectomy and left external iliac stent, PVD, HTN, DM2, CAD,  atrial fibrillation presented with right ischemic leg-further evaluation revealed occluded right/left limb of the aorto biiliac endograft-underwent emergent thrombectomy and right lower extremity 4 compartment fasciotomy-further evaluation with a intraoperative TEE showed a mobile left atrial thrombus.  See below for further details.  Significant events: 7/15>> admit for right ischemic leg-underwent emergent thrombectomy/fasciotomy of right leg  Significant studies: 7/15>> CT angio lower extremities: Right limb of the aortic stent graft occluded 7/21>> carotid Doppler: None hemodynamically significant stenosis bilaterally 7/21>> CT coronary: Large left upper lobe mass-encasing the left hilum-occluding left upper lobe/lingula bronchi with signs of postobstructive pneumonitis in the left upper lobe-mass invades the left upper lobe pulmonary vein with tumor thrombus in the left atrium extending to the mitral valve  Antimicrobial therapy: None  Microbiology data: None  Pathology: 7/15>>Thrombus-Right iliac thrombectomy:pending  Procedures : 7/20>> intraoperative TEE: Large lobulated mobile thrombus in the left atrium 7/20>> closure of right leg fasciotomy 7/15>> intraoperative TEE: Large LA thrombus 7/15>> Thrombectomy of right limb, left limb of aorto biiliac endograft, right lower extremity 4 compartment fasciotomy  Consults: PCCM, VVS, CTVS  DVT Prophylaxis : SCDs Start: 03/06/20 1559 IV bivalirudin.  Subjective: No major issues overnight-denies any chest pain or shortness of  breath.  Assessment/Plan: Acute bilateral occlusion of aortoiliac grafts with right ischemic leg-s/p thrombectomy/4 compartment fasciotomy likely secondary to left atrial thrombus: Vascular surgery following-s/p thrombectomy-found to have a large LA thrombus (?  Tumor thrombus-see below)-remains on IV anticoagulation-CT surgery contemplating sternotomy, cardiopulmonry bypass and resection of LA thrombus-work-up in progress.  Have reached out to pathology to see if we can get a prelim report-on biopsy of thrombus from right iliac thrombectomy-awaiting call back  Left lung mass: Seen on cardiac CT-subsequently confirmed on dedicated CT chest.  Spoke with patient today-he acknowledged to me today that approximately a year ago (around February 2020) [facility]-and elected not to proceed with any treatment.  Suspect underlying lung malignancy-may have caused hypercoagulable state resulting in tumor thrombus/LA thrombus with consequent embolic phenomena to lower extremities causing ischemic leg.  Have asked nursing staff to see if we can get records from the [facility] including biopsy/bronchoscopy report.  We will need to discuss with oncology at some point-but will await records/biopsy results.  PAF: Continue telemetry monitoring-on diltiazem-on IV bivalirudin.  Thrombocytopenia: Improved-continue bivalirudin-HIT antibody negative on 7/15-SRA still pending.  Normocytic anemia: Likely secondary to critical illness-no indication of GI loss.  Follow for now.  COPD: Not in exacerbation-continue bronchodilators.  HTN: BP stable  CAD: No anginal symptoms-continue Plavix/statin  HLD: Continue statin  DM-2: CBG stable-continue SSI   Recent Labs    03/13/20 1638 03/13/20 2126 03/14/20 0610  GLUCAP 172* 183* 149*   Pressure injury: Appreciate wound care input-have initiated 2 days of Diflucan.  Pressure Injury 03/12/20 Buttocks Mid Stage 3 -  Full thickness tissue  loss. Subcutaneous fat may be visible but bone, tendon or muscle are NOT exposed. (Active)  03/12/20 2000  Location: Buttocks  Location Orientation: Mid  Staging: Stage 3 -  Full thickness tissue loss. Subcutaneous fat may be visible but bone, tendon or muscle are NOT exposed.  Wound Description (Comments):   Present on Admission:     Diet: Diet Order            Diet Heart Room service appropriate? Yes; Fluid consistency: Thin  Diet effective now                  Code Status: Full code   Family Communication: Spoke to spouse on 7/23  Disposition Plan: Status is: Inpatient  Remains inpatient appropriate because:Inpatient level of care appropriate due to severity of illness   Dispo: The patient is from: Home              Anticipated d/c is to: TBD              Anticipated d/c date is: > 3 days              Patient currently is not medically stable to d/c.  Barriers to Discharge: Ischemic right leg-s/p emergent thrombectomy-found to have a large left atrial thrombus-CT surgery eval in progress-he may need cardiopulmonary bypass/thrombectomy  Antimicrobial agents: Anti-infectives (From admission, onward)   Start     Dose/Rate Route Frequency Ordered Stop   03/13/20 1000  fluconazole (DIFLUCAN) tablet 100 mg        100 mg Oral Daily 03/13/20 0858 03/14/20 0910   03/07/20 0200  ceFAZolin (ANCEF) IVPB 2g/100 mL premix        2 g 200 mL/hr over 30 Minutes Intravenous Every 8 hours 03/06/20 2112 03/07/20 1052   03/06/20 1958  ceFAZolin (ANCEF) 2-4 GM/100ML-% IVPB       Note to Pharmacy: Alba Cory   : cabinet override      03/06/20 1958 03/07/20 0759       Time spent: 25 minutes-Greater than 50% of this time was spent in counseling, explanation of diagnosis, planning of further management, and coordination of care.  MEDICATIONS: Scheduled Meds: . atorvastatin  20 mg Oral Daily  . Chlorhexidine Gluconate Cloth  6 each Topical Daily  . clopidogrel  75 mg Oral Daily   . diltiazem  90 mg Oral Q6H  . docusate sodium  100 mg Oral Daily  . Gerhardt's butt cream   Topical TID  . insulin aspart  0-15 Units Subcutaneous TID WC  . pantoprazole (PROTONIX) IV  40 mg Intravenous Daily  . umeclidinium bromide  1 puff Inhalation Daily   Continuous Infusions: . sodium chloride Stopped (03/08/20 1008)  . bivalirudin (ANGIOMAX) infusion 0.5 mg/mL (Non-ACS indications) 0.095 mg/kg/hr (03/13/20 0921)  . sodium chloride     PRN Meds:.sodium chloride, acetaminophen **OR** acetaminophen, albuterol, alum & mag hydroxide-simeth, guaiFENesin-dextromethorphan, metoprolol tartrate, morphine injection, ondansetron, oxyCODONE, phenol, polyethylene glycol, white petrolatum   PHYSICAL EXAM: Vital signs: Vitals:   03/13/20 2300 03/14/20 0353 03/14/20 0434 03/14/20 0748  BP: 117/71 126/77  122/77  Pulse: 67 66    Resp: 19 14  15   Temp: 98.9 F (37.2 C) 98.4 F (36.9 C)  97.8 F (36.6 C)  TempSrc: Oral Oral  Oral  SpO2: 98% 94%  97%  Weight:   88.7 kg   Height:       Filed Weights   03/12/20 0600 03/13/20 0500 03/14/20 0434  Weight: 88.1 kg 90.2 kg 88.7 kg   Body mass index is 24.44 kg/m.   Gen  Exam:Alert awake-not in any distress HEENT:atraumatic, normocephalic Chest: B/L clear to auscultation anteriorly CVS:S1S2 regular Abdomen:soft non tender, non distended Extremities:no edema Neurology: Non focal Skin: no rash  I have personally reviewed following labs and imaging studies  LABORATORY DATA: CBC: Recent Labs  Lab 03/10/20 0234 03/11/20 0318 03/12/20 0251 03/13/20 0304 03/14/20 0830  WBC 11.5* 8.0 9.9 10.6* 9.2  HGB 8.2* 7.2* 8.6* 8.6* 8.4*  HCT 25.9* 22.7* 26.7* 27.3* 26.7*  MCV 89.3 88.0 87.3 88.3 87.5  PLT 69* 100* 125* 139* 147*    Basic Metabolic Panel: Recent Labs  Lab 03/08/20 0415 03/08/20 0415 03/09/20 0301 03/10/20 0234 03/11/20 0318 03/12/20 0251 03/13/20 0304  NA 139   < > 136 134* 135 136 135  K 3.8   < > 3.9 4.3 3.7 4.0  3.6  CL 107   < > 103 100 102 103 102  CO2 24   < > 23 27 24 24 23   GLUCOSE 162*   < > 139* 161* 158* 155* 134*  BUN 10   < > 9 12 11 12 10   CREATININE 0.98   < > 0.99 0.95 0.93 1.06 0.99  CALCIUM 8.7*   < > 9.0 8.8* 8.8* 9.0 8.8*  MG 1.8  --  2.0 1.9 2.0 1.8  --   PHOS  --   --   --  2.7 2.7 2.9 2.5   < > = values in this interval not displayed.    GFR: Estimated Creatinine Clearance: 79.4 mL/min (by C-G formula based on SCr of 0.99 mg/dL).  Liver Function Tests: Recent Labs  Lab 03/11/20 0318  AST 38  ALT 22  ALKPHOS 50  BILITOT 1.8*  PROT 5.9*  ALBUMIN 2.9*   No results for input(s): LIPASE, AMYLASE in the last 168 hours. No results for input(s): AMMONIA in the last 168 hours.  Coagulation Profile: No results for input(s): INR, PROTIME in the last 168 hours.  Cardiac Enzymes: No results for input(s): CKTOTAL, CKMB, CKMBINDEX, TROPONINI in the last 168 hours.  BNP (last 3 results) No results for input(s): PROBNP in the last 8760 hours.  Lipid Profile: No results for input(s): CHOL, HDL, LDLCALC, TRIG, CHOLHDL, LDLDIRECT in the last 72 hours.  Thyroid Function Tests: No results for input(s): TSH, T4TOTAL, FREET4, T3FREE, THYROIDAB in the last 72 hours.  Anemia Panel: No results for input(s): VITAMINB12, FOLATE, FERRITIN, TIBC, IRON, RETICCTPCT in the last 72 hours.  Urine analysis:    Component Value Date/Time   COLORURINE YELLOW 07/02/2016 2112   APPEARANCEUR CLEAR 07/02/2016 2112   LABSPEC 1.006 07/02/2016 2112   PHURINE 5.5 07/02/2016 2112   GLUCOSEU NEGATIVE 07/02/2016 2112   HGBUR LARGE (A) 07/02/2016 2112   BILIRUBINUR NEGATIVE 07/02/2016 2112   KETONESUR NEGATIVE 07/02/2016 2112   PROTEINUR NEGATIVE 07/02/2016 2112   NITRITE NEGATIVE 07/02/2016 2112   LEUKOCYTESUR TRACE (A) 07/02/2016 2112    Sepsis Labs: Lactic Acid, Venous    Component Value Date/Time   LATICACIDVEN 2.4 ([facility]) 03/06/2020 2158    MICROBIOLOGY: Recent Results (from the past  240 hour(s))  SARS Coronavirus 2 by RT PCR (hospital order, performed in [place] hospital lab) Nasopharyngeal Nasopharyngeal Swab     Status: None   Collection Time: 03/06/20  2:28 PM   Specimen: Nasopharyngeal Swab  Result Value Ref Range Status   SARS Coronavirus 2 NEGATIVE NEGATIVE Final    Comment: (NOTE) SARS-CoV-2 target nucleic acids are NOT DETECTED.  The SARS-CoV-2 RNA is generally detectable in  upper and lower respiratory specimens during the acute phase of infection. The lowest concentration of SARS-CoV-2 viral copies this assay can detect is 250 copies / mL. A negative result does not preclude SARS-CoV-2 infection and should not be used as the sole basis for treatment or other patient management decisions.  A negative result may occur with improper specimen collection / handling, submission of specimen other than nasopharyngeal swab, presence of viral mutation(s) within the areas targeted by this assay, and inadequate number of viral copies (<250 copies / mL). A negative result must be combined with clinical observations, patient history, and epidemiological information.  Fact Sheet for Patients:   StrictlyIdeas.no  Fact Sheet for Healthcare Providers: BankingDealers.co.za  This test is not yet approved or  cleared by the [place] FDA and has been authorized for detection and/or diagnosis of SARS-CoV-2 by FDA under an Emergency Use Authorization (EUA).  This EUA will remain in effect (meaning this test can be used) for the duration of the COVID-19 declaration under Section 564(b)(1) of the Act, 21 U.S.C. section 360bbb-3(b)(1), unless the authorization is terminated or revoked sooner.  Performed at [facility], [place] [address] [place], [place] [address]   MRSA PCR Screening     Status: None   Collection Time: 03/06/20  9:38 PM   Specimen: Nasal Mucosa; Nasopharyngeal  Result Value Ref Range Status    MRSA by PCR NEGATIVE NEGATIVE Final    Comment:        The GeneXpert MRSA Assay (FDA approved for NASAL specimens only), is one component of a comprehensive MRSA colonization surveillance program. It is not intended to diagnose MRSA infection nor to guide or monitor treatment for MRSA infections. Performed at [facility], [place] [address] [place], [place] [address]     RADIOLOGY STUDIES/RESULTS: CT CHEST W CONTRAST  Result Date: 03/13/2020 CLINICAL DATA:  Abnormal x-ray findings. EXAM: CT CHEST WITH CONTRAST TECHNIQUE: Multidetector CT imaging of the chest was performed during intravenous contrast administration. CONTRAST:  58mL OMNIPAQUE IOHEXOL 300 MG/ML  SOLN COMPARISON:  March 12, 2020 FINDINGS: Cardiovascular: The pulmonary arteries are limited in evaluation secondary to suboptimal opacification with intravenous contrast. Normal heart size. A 3.6 cm x 2.4 cm area of low attenuation is seen within the anterior aspect of the right atrium. This extends from a large left suprahilar mass. Mild involvement of the left atrium is noted. No pericardial effusion. Mediastinum/Nodes: No enlarged mediastinal, hilar, or axillary lymph nodes. Thyroid gland, trachea, and esophagus demonstrate no significant findings. Lungs/Pleura: There is a 10.5 cm x 5.7 cm lobulated soft tissue mass seen along the left hilar and left suprahilar regions. There is no evidence of acute infiltrate, pleural effusion or pneumothorax. Upper Abdomen: There is evidence of prior stenting of the infrarenal abdominal aorta. Musculoskeletal: No chest wall abnormality. No acute or significant osseous findings. IMPRESSION: 1. 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 2. 3.6 cm x 2.4 cm area of low attenuation within the anterior aspect of the right atrium and adjacent portion of the left ventricle. While this is likely consistent with vascular invasion of the previously noted lung mass,  a component of associated thrombus cannot be excluded. 3. Evidence of prior stenting of the infrarenal abdominal aorta. Electronically Signed   By: Virgina Norfolk M.D.   On: 03/13/2020 19:05   CT CORONARY MORPH W/CTA COR W/SCORE W/CA W/CM &/OR WO/CM  Addendum Date: 03/13/2020   ADDENDUM REPORT: 03/13/2020  09:16 EXAM: OVER-READ INTERPRETATION  CT CHEST The following report is an over-read performed by radiologist Dr. Norlene Duel [facility] on 03/13/2020. This over-read does not include interpretation of cardiac or coronary anatomy or pathology. The coronary CTA and coronary calcium score interpretation by the cardiologist is attached. COMPARISON:  CTA 01/16/2020 FINDINGS: Vascular: Normal heart size.  No pericardial effusion identified. Mediastinum/nodes: Dilated and patulous esophagus. Lungs/pleura: There is a large, left upper lobe, perihilar lung mass which measures 8.9 x 5.5 cm, image 3/11. This mass encases the left hilum and occludes the left upper lobe and lingular bronchi with signs of postobstructive pneumonitis in the left upper lobe. The mass invades the left upper lobe pulmonary vein with tumor thrombus identified in the left atrium. The tumor thrombus protrudes through the mitral valve into the left ventricle. Upper abdomen: No acute abnormality. Musculoskeletal: No acute abnormality. IMPRESSION: 1. 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 This encases the left hilum and occludes the left upper lobe and lingular bronchi with signs of postobstructive pneumonitis in the left upper lobe. The mass invades the left upper lobe pulmonary vein with tumor thrombus in the left atrium, extending through the mitral valve. Recommend further evaluation with PET-CT. Electronically Signed   By: Kerby Moors M.D.   On: 03/13/2020 09:16   Result Date: 03/13/2020 CLINICAL DATA:  74 year old male with h/o CAD s/p PCI to RCA in 2006,  PAD, s/p EVAR with left EIA stent and finding of a large left atrial thrombus. EXAM: Cardiac/Coronary  CTA TECHNIQUE: The patient was scanned on a [employer]. FINDINGS: A 100 kV prospective scan was triggered in the descending thoracic aorta at 111 HU's. Axial non-contrast 3 mm slices were carried out through the heart. The data set was analyzed on a dedicated work station and scored using the [place]. Gantry rotation speed was 250 msecs and collimation was .6 mm. No beta blockade and 0.8 mg of sl NTG was given. The 3D data set was reconstructed in 5% intervals of the 67-82 % of the R-R cycle. Diastolic phases were analyzed on a dedicated work station using MPR, MIP and VRT modes. The patient received 80 cc of contrast. Aorta: Normal size. There is moderate to severe diffuse atherosclerotic plaque with mild calcifications. No dissection. Aortic Valve:  Trileaflet.  No calcifications. Coronary Arteries: Normal coronary origin. Right dominance. Coronary artery evaluation is affected by significant motion and poor vasodilation and therefore not interpretable. There are two stents in the proximal to mid RCA. 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 The thrombus is highly mobile with high risk of embolization. It is partially protruding into the left ventricle across the mitral valve and partially obstructing the flow to the left ventricle. The thrombus is only partially extending into the left atrial appendage. Dilated pulmonary artery suggestive of pulmonary hypertension. IMPRESSION: 1. Coronary Arteries: Normal coronary origin. Right dominance. Coronary artery evaluation is affected by significant motion and poor vasodilation and therefore not interpretable. There are two stents in the proximal to mid RCA. 2. There is moderate to severe diffuse atherosclerotic plaque  with mild calcifications. No dissection. 3. 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  The thrombus is highly mobile with high risk of embolization. It is partially protruding into the left ventricle across the mitral valve and partially obstructing the flow to the left ventricle. The thrombus is only partially extending into the left atrial appendage. 4. Dilated pulmonary artery suggestive of pulmonary hypertension. Electronically Signed: By: Ena Dawley On: 03/12/2020 18:57     LOS: 8 days   Oren Binet, MD  [facility]    To contact the attending provider between 7A-7P or the covering provider during after hours 7P-7A, please log into the web site [email or web address] and access using universal Brenda password for that web site. If you do not have the password, please call the hospital operator.  03/14/2020, 11:14 AM

## 2020-03-14 NOTE — Progress Notes (Addendum)
      [address]       [place] [address]             [phone]      He is doing okay this morning. Asking to be pulled up in the bed. Serotonin test for HIT still pending. Remains on Angiomax.   Assisted in getting the patient more comfortable. Right leg is wrapped in Kerlix and still draining.   Nicholes Rough, PA-C   Chest CT scan on 03/13/2020  IMPRESSION: 1. 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 2. 3.6 cm x 2.4 cm area of low attenuation within the anterior aspect of the right atrium and adjacent portion of the left ventricle. While this is likely consistent with vascular invasion of the previously noted lung mass, a component of associated thrombus cannot be excluded. 3. Evidence of prior stenting of the infrarenal abdominal aorta.

## 2020-03-14 NOTE — Progress Notes (Addendum)
  Progress Note    03/14/2020 7:57 AM 3 Days Post-Op  Subjective: continues to complain of buttock soreness and " raw area". Also states he feels a little more short of breath this morning   Vitals:   03/14/20 0353 03/14/20 0748  BP: 126/77 122/77  Pulse: 66   Resp: 14 15  Temp: 98.4 F (36.9 C) 97.8 F (36.6 C)  SpO2: 94% 97%   Physical Exam: Cardiac:  regular Lungs:  Non labored, 97% on RA Incisions:  Bilateral groin incisions with stable hematomas. Incisions c/d/i. Right lower extremity fasciotomy sites closed with staples to skin. Right medial incision with some mid wound dehiscence. Serous drainage from both wounds. Xeroform dressings Extremities: Bilateral lower extremities well perfused and warm. Motor and sensation intact. Doppler PT right and Doppler L DP/ PT signals Abdomen:  Soft, non tender Neurologic: alert and oriented  CBC    Component Value Date/Time   WBC 10.6 (H) 03/13/2020 0304   RBC 3.09 (L) 03/13/2020 0304   HGB 8.6 (L) 03/13/2020 0304   HCT 27.3 (L) 03/13/2020 0304   PLT 139 (L) 03/13/2020 0304   MCV 88.3 03/13/2020 0304   MCH 27.8 03/13/2020 0304   MCHC 31.5 03/13/2020 0304   RDW 13.8 03/13/2020 0304   LYMPHSABS 2.1 03/06/2020 1417   MONOABS 0.6 03/06/2020 1417   EOSABS 0.1 03/06/2020 1417   BASOSABS 0.0 03/06/2020 1417    BMET    Component Value Date/Time   NA 135 03/13/2020 0304   K 3.6 03/13/2020 0304   CL 102 03/13/2020 0304   CO2 23 03/13/2020 0304   GLUCOSE 134 (H) 03/13/2020 0304   BUN 10 03/13/2020 0304   CREATININE 0.99 03/13/2020 0304   CALCIUM 8.8 (L) 03/13/2020 0304   GFRNONAA >60 03/13/2020 0304   GFRAA >60 03/13/2020 0304    INR    Component Value Date/Time   INR 1.3 (H) 03/06/2020 1417     Intake/Output Summary (Last 24 hours) at 03/14/2020 0757 Last data filed at 03/13/2020 2121 Gross per 24 hour  Intake --  Output 300 ml  Net -300 ml     Assessment/Plan:  74 y.o. male is s/p bilateral thrombectomies of  both limbs of previous EVAR with RLE fasciotomies  8 Days post op and right lower extremity fasciotomy closure 3 Days Post-Op. Will need to continue to monitor fasciotomy sites for adequate healing. Medial incision with some wound dehiscence and serous drainage from both incisions. Xeroform and Kerlix applied. Continue to mobilize as tolerated. CT surgery still pending decision on surgery for atrial thrombus. Serotonin test for HIT still pending. Remains on Angiomax.   DVT prophylaxis:  Bivalirudin   Karoline Caldwell, PA-C [facility] [phone] 03/14/2020 7:57 AM   I have seen and evaluated the patient. I agree with the PA note as documented above. Brisk doppler signals bilateral lower extremities.  Remains on angiomax.  Serotonin study still pending.  Apparently was diagnosed with lung cancer at [facility] and elected no treatment.  May explain hypercoagulable state.  Marty Heck, MD [facility] [place] Office: [phone]

## 2020-03-14 NOTE — Plan of Care (Signed)
  Problem: Education: Goal: Knowledge of General Education information will improve Description: Including pain rating scale, medication(s)/side effects and non-pharmacologic comfort measures Outcome: Progressing   Problem: Health Behavior/Discharge Planning: Goal: Ability to manage health-related needs will improve Outcome: Progressing   Problem: Clinical Measurements: Goal: Ability to maintain clinical measurements within normal limits will improve Outcome: Progressing Goal: Will remain free from infection Outcome: Progressing Goal: Diagnostic test results will improve Outcome: Progressing Goal: Respiratory complications will improve Outcome: Progressing Goal: Cardiovascular complication will be avoided Outcome: Progressing   Problem: Activity: Goal: Risk for activity intolerance will decrease Outcome: Progressing   Problem: Nutrition: Goal: Adequate nutrition will be maintained Outcome: Progressing   Problem: Coping: Goal: Level of anxiety will decrease Outcome: Progressing   Problem: Elimination: Goal: Will not experience complications related to bowel motility Outcome: Progressing Goal: Will not experience complications related to urinary retention Outcome: Progressing   Problem: Pain Managment: Goal: General experience of comfort will improve Outcome: Progressing   Problem: Safety: Goal: Ability to remain free from injury will improve Outcome: Progressing   Problem: Skin Integrity: Goal: Risk for impaired skin integrity will decrease Outcome: Progressing   Problem: Clinical Measurements: Goal: Signs and symptoms of graft occlusion will improve Outcome: Progressing   Problem: Skin Integrity: Goal: Demonstration of wound healing without infection will improve Outcome: Progressing

## 2020-03-15 DIAGNOSIS — I236 Thrombosis of atrium, auricular appendage, and ventricle as current complications following acute myocardial infarction: Secondary | ICD-10-CM | POA: Diagnosis not present

## 2020-03-15 DIAGNOSIS — I10 Essential (primary) hypertension: Secondary | ICD-10-CM | POA: Diagnosis not present

## 2020-03-15 DIAGNOSIS — I480 Paroxysmal atrial fibrillation: Secondary | ICD-10-CM | POA: Diagnosis not present

## 2020-03-15 DIAGNOSIS — I513 Intracardiac thrombosis, not elsewhere classified: Secondary | ICD-10-CM | POA: Diagnosis not present

## 2020-03-15 DIAGNOSIS — I998 Other disorder of circulatory system: Secondary | ICD-10-CM | POA: Diagnosis not present

## 2020-03-15 LAB — APTT: aPTT: 42 seconds — ABNORMAL HIGH (ref 24–36)

## 2020-03-15 LAB — BASIC METABOLIC PANEL
Anion gap: 10 (ref 5–15)
BUN: 10 mg/dL (ref 8–23)
CO2: 23 mmol/L (ref 22–32)
Calcium: 9 mg/dL (ref 8.9–10.3)
Chloride: 104 mmol/L (ref 98–111)
Creatinine, Ser: 0.92 mg/dL (ref 0.61–1.24)
GFR calc Af Amer: >60 mL/min (ref >60)
GFR calc non Af Amer: >60 mL/min (ref >60)
Glucose, Bld: 142 mg/dL — ABNORMAL HIGH (ref 70–99)
Potassium: 4 mmol/L (ref 3.5–5.1)
Sodium: 137 mmol/L (ref 135–145)

## 2020-03-15 LAB — TYPE AND SCREEN
ABO/RH(D): O POS
Antibody Screen: NEGATIVE
Unit division: 0
Unit division: 0

## 2020-03-15 LAB — CBC
HCT: 28.1 % — ABNORMAL LOW (ref 39.0–52.0)
Hemoglobin: 9.1 g/dL — ABNORMAL LOW (ref 13.0–17.0)
MCH: 28.9 pg (ref 26.0–34.0)
MCHC: 32.4 g/dL (ref 30.0–36.0)
MCV: 89.2 fL (ref 80.0–100.0)
Platelets: 148 10*3/uL — ABNORMAL LOW (ref 150–400)
RBC: 3.15 MIL/uL — ABNORMAL LOW (ref 4.22–5.81)
RDW: 13.7 % (ref 11.5–15.5)
WBC: 7.7 10*3/uL (ref 4.0–10.5)
nRBC: 0 % (ref 0.0–0.2)

## 2020-03-15 LAB — BPAM RBC
Blood Product Expiration Date: 202108202359
Blood Product Expiration Date: 202108202359
ISSUE DATE / TIME: 202107201428
Unit Type and Rh: 5100
Unit Type and Rh: 5100

## 2020-03-15 LAB — GLUCOSE, CAPILLARY
Glucose-Capillary: 130 mg/dL — ABNORMAL HIGH (ref 70–99)
Glucose-Capillary: 159 mg/dL — ABNORMAL HIGH (ref 70–99)
Glucose-Capillary: 130 mg/dL — ABNORMAL HIGH (ref 70–99)
Glucose-Capillary: 144 mg/dL — ABNORMAL HIGH (ref 70–99)

## 2020-03-15 MED ORDER — ENOXAPARIN SODIUM 100 MG/ML ~~LOC~~ SOLN
1.0000 mg/kg | Freq: Two times a day (BID) | SUBCUTANEOUS | Status: DC
  Administered 2020-03-16 – 2020-03-18 (×5): 87.5 mg via SUBCUTANEOUS
  Filled 2020-03-15 (×5): qty 0.88

## 2020-03-15 MED ORDER — ENOXAPARIN SODIUM 100 MG/ML ~~LOC~~ SOLN
1.0000 mg/kg | Freq: Once | SUBCUTANEOUS | Status: AC
  Administered 2020-03-15: 87.5 mg via SUBCUTANEOUS
  Filled 2020-03-15: qty 0.88

## 2020-03-15 MED ORDER — ENOXAPARIN SODIUM 100 MG/ML ~~LOC~~ SOLN
1.0000 mg/kg | Freq: Once | SUBCUTANEOUS | Status: DC
  Filled 2020-03-15: qty 0.88

## 2020-03-15 MED ORDER — HEPARIN (PORCINE) 25000 UT/250ML-% IV SOLN
1250.0000 [IU]/h | INTRAVENOUS | Status: DC
  Administered 2020-03-15: 1250 [IU]/h via INTRAVENOUS
  Filled 2020-03-15: qty 250

## 2020-03-15 MED ORDER — ENOXAPARIN SODIUM 100 MG/ML ~~LOC~~ SOLN: 1.0000 mg/kg | Freq: Two times a day (BID) | SUBCUTANEOUS | Status: DC

## 2020-03-15 NOTE — Plan of Care (Signed)
  Problem: Education: Goal: Knowledge of General Education information will improve Description: Including pain rating scale, medication(s)/side effects and non-pharmacologic comfort measures Outcome: Progressing   Problem: Health Behavior/Discharge Planning: Goal: Ability to manage health-related needs will improve Outcome: Progressing   Problem: Clinical Measurements: Goal: Ability to maintain clinical measurements within normal limits will improve Outcome: Progressing Goal: Will remain free from infection Outcome: Progressing Goal: Diagnostic test results will improve Outcome: Progressing Goal: Respiratory complications will improve Outcome: Progressing Goal: Cardiovascular complication will be avoided Outcome: Progressing   Problem: Activity: Goal: Risk for activity intolerance will decrease Outcome: Progressing   Problem: Nutrition: Goal: Adequate nutrition will be maintained Outcome: Progressing   Problem: Coping: Goal: Level of anxiety will decrease Outcome: Progressing   Problem: Elimination: Goal: Will not experience complications related to bowel motility Outcome: Progressing Goal: Will not experience complications related to urinary retention Outcome: Progressing   Problem: Pain Managment: Goal: General experience of comfort will improve Outcome: Progressing   Problem: Safety: Goal: Ability to remain free from injury will improve Outcome: Progressing   Problem: Skin Integrity: Goal: Risk for impaired skin integrity will decrease Outcome: Progressing   Problem: Clinical Measurements: Goal: Signs and symptoms of graft occlusion will improve Outcome: Progressing   Problem: Skin Integrity: Goal: Demonstration of wound healing without infection will improve Outcome: Progressing

## 2020-03-15 NOTE — Plan of Care (Signed)
  Problem: Education: Goal: Knowledge of General Education information will improve Description: Including pain rating scale, medication(s)/side effects and non-pharmacologic comfort measures 03/15/2020 1552 by Theotis Barrio, RN Outcome: Progressing 03/15/2020 1552 by Theotis Barrio, RN Outcome: Progressing   Problem: Health Behavior/Discharge Planning: Goal: Ability to manage health-related needs will improve 03/15/2020 1552 by Theotis Barrio, RN Outcome: Progressing 03/15/2020 1552 by Theotis Barrio, RN Outcome: Progressing   Problem: Clinical Measurements: Goal: Ability to maintain clinical measurements within normal limits will improve 03/15/2020 1552 by Theotis Barrio, RN Outcome: Progressing 03/15/2020 1552 by Theotis Barrio, RN Outcome: Progressing Goal: Will remain free from infection 03/15/2020 1552 by Theotis Barrio, RN Outcome: Progressing 03/15/2020 1552 by Theotis Barrio, RN Outcome: Progressing Goal: Diagnostic test results will improve 03/15/2020 1552 by Theotis Barrio, RN Outcome: Progressing 03/15/2020 1552 by Theotis Barrio, RN Outcome: Progressing Goal: Respiratory complications will improve 03/15/2020 1552 by Theotis Barrio, RN Outcome: Progressing 03/15/2020 1552 by Theotis Barrio, RN Outcome: Progressing Goal: Cardiovascular complication will be avoided 03/15/2020 1552 by Theotis Barrio, RN Outcome: Progressing 03/15/2020 1552 by Theotis Barrio, RN Outcome: Progressing   Problem: Activity: Goal: Risk for activity intolerance will decrease 03/15/2020 1552 by Theotis Barrio, RN Outcome: Progressing 03/15/2020 1552 by Theotis Barrio, RN Outcome: Progressing   Problem: Nutrition: Goal: Adequate nutrition will be maintained 03/15/2020 1552 by Theotis Barrio, RN Outcome: Progressing 03/15/2020 1552 by Theotis Barrio, RN Outcome: Progressing   Problem: Coping: Goal: Level of anxiety will decrease 03/15/2020 1552 by Theotis Barrio, RN Outcome: Progressing 03/15/2020 1552 by Theotis Barrio, RN Outcome: Progressing   Problem: Elimination: Goal: Will not experience complications related to bowel motility 03/15/2020 1552 by Theotis Barrio, RN Outcome: Progressing 03/15/2020 1552 by Theotis Barrio, RN Outcome: Progressing Goal: Will not experience complications related to urinary retention 03/15/2020 1552 by Theotis Barrio, RN Outcome: Progressing 03/15/2020 1552 by Theotis Barrio, RN Outcome: Progressing   Problem: Pain Managment: Goal: General experience of comfort will improve 03/15/2020 1552 by Theotis Barrio, RN Outcome: Progressing 03/15/2020 1552 by Theotis Barrio, RN Outcome: Progressing   Problem: Safety: Goal: Ability to remain free from injury will improve 03/15/2020 1552 by Theotis Barrio, RN Outcome: Progressing 03/15/2020 1552 by Theotis Barrio, RN Outcome: Progressing   Problem: Skin Integrity: Goal: Risk for impaired skin integrity will decrease 03/15/2020 1552 by Theotis Barrio, RN Outcome: Progressing 03/15/2020 1552 by Theotis Barrio, RN Outcome: Progressing   Problem: Clinical Measurements: Goal: Signs and symptoms of graft occlusion will improve 03/15/2020 1552 by Theotis Barrio, RN Outcome: Progressing 03/15/2020 1552 by Theotis Barrio, RN Outcome: Progressing   Problem: Skin Integrity: Goal: Demonstration of wound healing without infection will improve 03/15/2020 1552 by Theotis Barrio, RN Outcome: Progressing 03/15/2020 1552 by Theotis Barrio, RN Outcome: Progressing

## 2020-03-15 NOTE — Progress Notes (Addendum)
PROGRESS NOTE        PATIENT DETAILS Name: [name] Age: 74 y.o. Sex: male Date of Birth: Feb 28, 1946 Admit Date: 03/06/2020 Admitting Physician Garner Nash, DO [facility]  Brief Narrative: Patient is a 74 y.o. male with hx AAA s/p repair in 12/2019 with endovascular stent graft s/p left iliac thrombectomy and left external iliac stent, PVD, HTN, DM2, CAD,  atrial fibrillation presented with right ischemic leg-further evaluation revealed occluded right/left limb of the aorto biiliac endograft-underwent emergent thrombectomy and right lower extremity 4 compartment fasciotomy-further evaluation with a intraoperative TEE showed a mobile left atrial thrombus.  Subsequent CT imaging study showed a large left lung mass-eroding into the mediastinum into the pulmonary vein and into the left atrium.  Suspicion for tumor embolism causing ischemic leg-see below for further details.  Significant events: 7/15>> admit for right ischemic leg-underwent emergent thrombectomy/fasciotomy of right leg  Significant studies: 7/15>> CT angio lower extremities: Right limb of the aortic stent graft occluded 7/21>> carotid Doppler: None hemodynamically significant stenosis bilaterally 7/21>> CT coronary: Large left upper lobe mass-encasing the left hilum-occluding left upper lobe/lingula bronchi with signs of postobstructive pneumonitis in the left upper lobe-mass invades the left upper lobe pulmonary vein with tumor thrombus in the left atrium extending to the mitral valve  Antimicrobial therapy: None  Microbiology data: None  Pathology: 7/15>>Thrombus-Right iliac thrombectomy:pending  Procedures : 7/20>> intraoperative TEE: Large lobulated mobile thrombus in the left atrium 7/20>> closure of right leg fasciotomy 7/15>> intraoperative TEE: Large LA thrombus 7/15>> Thrombectomy of right limb, left limb of aorto biiliac endograft, right lower extremity 4 compartment  fasciotomy  Consults: PCCM, VVS, CTVS  DVT Prophylaxis : SCDs Start: 03/06/20 1559 IV bivalirudin.  Subjective: No major issues overnight-denies any chest pain or shortness of breath.  Assessment/Plan: Acute bilateral occlusion of aortoiliac grafts with right ischemic leg-s/p thrombectomy/4 compartment fasciotomy likely secondary to tumor embolism from large atrial thrombus/tumor due to lung malignancy eroding into the mediastinum: Vascular surgery following-s/p thrombectomy.  Discussed with pathology (Dr. 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  See discussion below.  In interim-remains on full dose anticoagulation-pharmacy recommending that we switch to IV heparin as HIT antibody/SRA negative.  Since no surgical procedures are being planned-we will start overlapping Coumadin.  Addendum: Spoke with oncology-Dr. [name] recommends Lovenox-we will discuss with pharmacy.  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  He apparently had a bronchoscopy with biopsy-awaiting records from [facility].  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  We will discuss with oncology.  PAF: Continue telemetry monitoring-on diltiazem-initially on IV bivalirudin-but being switched to IV heparin with overlapping Coumadin on 7/24.  Thrombocytopenia: Improved-HIT antibody negative and SRA negative-initially on bivalirudin-being switched to IV heparin.   Normocytic anemia: Likely secondary to critical illness-no indication of GI loss.  Follow for now.  COPD: Not in exacerbation-continue bronchodilators.  HTN: BP stable  CAD: No anginal symptoms-continue Plavix/statin  HLD: Continue statin  DM-2: CBG stable-continue  SSI   Recent Labs    03/14/20 1613 03/14/20 2056 03/15/20 0632  GLUCAP 191* 187* 130*   Pressure injury:  Appreciate wound care input-have initiated 2 days of Diflucan.  Pressure Injury 03/12/20 Buttocks Mid Stage 3 -  Full thickness tissue loss. Subcutaneous fat may be visible but bone, tendon or muscle are NOT exposed. (Active)  03/12/20 2000  Location: Buttocks  Location Orientation: Mid  Staging: Stage 3 -  Full thickness tissue loss. Subcutaneous fat may be visible but bone, tendon or muscle are NOT exposed.  Wound Description (Comments):   Present on Admission:     Diet: Diet Order            Diet Heart Room service appropriate? Yes; Fluid consistency: Thin  Diet effective now                  Code Status: Full code   Family Communication: Spoke to spouse on 7/23  Disposition Plan: Status is: Inpatient  Remains inpatient appropriate because:Inpatient level of care appropriate due to severity of illness   Dispo: The patient is from: Home              Anticipated d/c is to: TBD              Anticipated d/c date is: > 3 days              Patient currently is not medically stable to d/c.  Barriers to Discharge: Ischemic right leg-s/p emergent thrombectomy-found to have a large left atrial thrombus-CT surgery eval in progress-he may need cardiopulmonary bypass/thrombectomy  Antimicrobial agents: Anti-infectives (From admission, onward)   Start     Dose/Rate Route Frequency Ordered Stop   03/13/20 1000  fluconazole (DIFLUCAN) tablet 100 mg        100 mg Oral Daily 03/13/20 0858 03/14/20 0910   03/07/20 0200  ceFAZolin (ANCEF) IVPB 2g/100 mL premix        2 g 200 mL/hr over 30 Minutes Intravenous Every 8 hours 03/06/20 2112 03/07/20 1052   03/06/20 1958  ceFAZolin (ANCEF) 2-4 GM/100ML-% IVPB       Note to Pharmacy: Alba Cory   : cabinet override      03/06/20 1958 03/07/20 0759       Time spent: 25 minutes-Greater than 50% of this time was spent in  counseling, explanation of diagnosis, planning of further management, and coordination of care.  MEDICATIONS: Scheduled Meds: . atorvastatin  20 mg Oral Daily  . clopidogrel  75 mg Oral Daily  . diltiazem  90 mg Oral Q6H  . docusate sodium  100 mg Oral Daily  . Gerhardt's butt cream   Topical TID  . insulin aspart  0-15 Units Subcutaneous TID WC  . pantoprazole (PROTONIX) IV  40 mg Intravenous Daily  . umeclidinium bromide  1 puff Inhalation Daily   Continuous Infusions: . sodium chloride Stopped (03/08/20 1008)  . heparin 1,250 Units/hr (03/15/20 1031)  . sodium chloride     PRN Meds:.sodium chloride, acetaminophen **OR** acetaminophen, albuterol, alum & mag hydroxide-simeth, guaiFENesin-dextromethorphan, metoprolol tartrate, morphine injection, ondansetron, oxyCODONE, phenol, polyethylene glycol, white petrolatum   PHYSICAL EXAM: Vital signs: Vitals:   03/14/20 1932 03/14/20 2312 03/15/20 0258 03/15/20 0800  BP: (!) 129/67 (!) 138/78 (!) 131/73 126/77  Pulse: 63 64 62 71  Resp: 20 16 20 20   Temp: 98.7 F (37.1 C) 98 F (36.7 C) 98.2 F (36.8 C) 98.4 F (36.9 C)  TempSrc: Oral Oral Oral Axillary  SpO2: 98% 98% 98% 97%  Weight:   87.9 kg   Height:   6\' 3"  (1.905 m)  Filed Weights   03/13/20 0500 03/14/20 0434 03/15/20 0258  Weight: 90.2 kg 88.7 kg 87.9 kg   Body mass index is 24.22 kg/m.   Gen Exam:Alert awake-not in any distress HEENT:atraumatic, normocephalic Chest: B/L clear to auscultation anteriorly CVS:S1S2 regular Abdomen:soft non tender, non distended Extremities:no edema Neurology: Non focal Skin: no rash  I have personally reviewed following labs and imaging studies  LABORATORY DATA: CBC: Recent Labs  Lab 03/11/20 0318 03/12/20 0251 03/13/20 0304 03/14/20 0830 03/15/20 0246  WBC 8.0 9.9 10.6* 9.2 7.7  HGB 7.2* 8.6* 8.6* 8.4* 9.1*  HCT 22.7* 26.7* 27.3* 26.7* 28.1*  MCV 88.0 87.3 88.3 87.5 89.2  PLT 100* 125* 139* 147* 148*    Basic  Metabolic Panel: Recent Labs  Lab 03/09/20 0301 03/09/20 0301 03/10/20 0234 03/11/20 0318 03/12/20 0251 03/13/20 0304 03/15/20 0246  NA 136   < > 134* 135 136 135 137  K 3.9   < > 4.3 3.7 4.0 3.6 4.0  CL 103   < > 100 102 103 102 104  CO2 23   < > 27 24 24 23 23   GLUCOSE 139*   < > 161* 158* 155* 134* 142*  BUN 9   < > 12 11 12 10 10   CREATININE 0.99   < > 0.95 0.93 1.06 0.99 0.92  CALCIUM 9.0   < > 8.8* 8.8* 9.0 8.8* 9.0  MG 2.0  --  1.9 2.0 1.8  --   --   PHOS  --   --  2.7 2.7 2.9 2.5  --    < > = values in this interval not displayed.    GFR: Estimated Creatinine Clearance: 85.5 mL/min (by C-G formula based on SCr of 0.92 mg/dL).  Liver Function Tests: Recent Labs  Lab 03/11/20 0318  AST 38  ALT 22  ALKPHOS 50  BILITOT 1.8*  PROT 5.9*  ALBUMIN 2.9*   No results for input(s): LIPASE, AMYLASE in the last 168 hours. No results for input(s): AMMONIA in the last 168 hours.  Coagulation Profile: No results for input(s): INR, PROTIME in the last 168 hours.  Cardiac Enzymes: No results for input(s): CKTOTAL, CKMB, CKMBINDEX, TROPONINI in the last 168 hours.  BNP (last 3 results) No results for input(s): PROBNP in the last 8760 hours.  Lipid Profile: No results for input(s): CHOL, HDL, LDLCALC, TRIG, CHOLHDL, LDLDIRECT in the last 72 hours.  Thyroid Function Tests: No results for input(s): TSH, T4TOTAL, FREET4, T3FREE, THYROIDAB in the last 72 hours.  Anemia Panel: No results for input(s): VITAMINB12, FOLATE, FERRITIN, TIBC, IRON, RETICCTPCT in the last 72 hours.  Urine analysis:    Component Value Date/Time   COLORURINE YELLOW 07/02/2016 2112   APPEARANCEUR CLEAR 07/02/2016 2112   LABSPEC 1.006 07/02/2016 2112   PHURINE 5.5 07/02/2016 2112   GLUCOSEU NEGATIVE 07/02/2016 2112   HGBUR LARGE (A) 07/02/2016 2112   BILIRUBINUR NEGATIVE 07/02/2016 2112   KETONESUR NEGATIVE 07/02/2016 2112   PROTEINUR NEGATIVE 07/02/2016 2112   NITRITE NEGATIVE 07/02/2016  2112   LEUKOCYTESUR TRACE (A) 07/02/2016 2112    Sepsis Labs: Lactic Acid, Venous    Component Value Date/Time   LATICACIDVEN 2.4 ([place]) 03/06/2020 2158    MICROBIOLOGY: Recent Results (from the past 240 hour(s))  SARS Coronavirus 2 by RT PCR (hospital order, performed in [place] hospital lab) Nasopharyngeal Nasopharyngeal Swab     Status: None   Collection Time: 03/06/20  2:28 PM   Specimen: Nasopharyngeal Swab  Result Value  Ref Range Status   SARS Coronavirus 2 NEGATIVE NEGATIVE Final    Comment: (NOTE) SARS-CoV-2 target nucleic acids are NOT DETECTED.  The SARS-CoV-2 RNA is generally detectable in upper and lower respiratory specimens during the acute phase of infection. The lowest concentration of SARS-CoV-2 viral copies this assay can detect is 250 copies / mL. A negative result does not preclude SARS-CoV-2 infection and should not be used as the sole basis for treatment or other patient management decisions.  A negative result may occur with improper specimen collection / handling, submission of specimen other than nasopharyngeal swab, presence of viral mutation(s) within the areas targeted by this assay, and inadequate number of viral copies (<250 copies / mL). A negative result must be combined with clinical observations, patient history, and epidemiological information.  Fact Sheet for Patients:   StrictlyIdeas.no  Fact Sheet for Healthcare Providers: BankingDealers.co.za  This test is not yet approved or  cleared by the [place] FDA and has been authorized for detection and/or diagnosis of SARS-CoV-2 by FDA under an Emergency Use Authorization (EUA).  This EUA will remain in effect (meaning this test can be used) for the duration of the COVID-19 declaration under Section 564(b)(1) of the Act, 21 U.S.C. section 360bbb-3(b)(1), unless the authorization is terminated or revoked sooner.  Performed at [facility], [place] [address], [place], [place] [address]   MRSA PCR Screening     Status: None   Collection Time: 03/06/20  9:38 PM   Specimen: Nasal Mucosa; Nasopharyngeal  Result Value Ref Range Status   MRSA by PCR NEGATIVE NEGATIVE Final    Comment:        The GeneXpert MRSA Assay (FDA approved for NASAL specimens only), is one component of a comprehensive MRSA colonization surveillance program. It is not intended to diagnose MRSA infection nor to guide or monitor treatment for MRSA infections. Performed at [facility], [place] [address], [place], [place] [address]     RADIOLOGY STUDIES/RESULTS: CT CHEST W CONTRAST  Addendum Date: 03/14/2020   ADDENDUM REPORT: 03/14/2020 13:45 ADDENDUM: Thoracic surgeon Dr. Servando Snare called for a second opinion regarding the location of the cardiac mass. The irregular soft tissue mass within the left atrial appendage, left superior pulmonary vein and left aspect of the left atrium extending to mitral valve and potentially to the entrance of the left ventricle is contiguous with the large central left upper lobe lung mass and is favored to represent direct tumor invasion of these structures. While a component of bland thrombus is possible at the periphery of this cardiac mass, this is felt to largely represent direct cardiac tumor invasion. These results were discussed by telephone by Dr. Polly Cobia at 03/14/2020 at 1:44 pm with Dr. Servando Snare, who verbally acknowledged these results. Electronically Signed   By: Ilona Sorrel M.D.   On: 03/14/2020 13:45   Result Date: 03/14/2020 CLINICAL DATA:  Abnormal x-ray findings. EXAM: CT CHEST WITH CONTRAST TECHNIQUE: Multidetector CT imaging of the chest was performed during intravenous contrast administration. CONTRAST:  37mL OMNIPAQUE IOHEXOL 300 MG/ML  SOLN COMPARISON:  March 12, 2020 FINDINGS: Cardiovascular: The pulmonary arteries are limited in evaluation secondary to suboptimal opacification with intravenous contrast.  Normal heart size. A 3.6 cm x 2.4 cm area of low attenuation is seen within the anterior aspect of the right atrium. This extends from a large left suprahilar mass. Mild involvement of the left atrium is noted. No pericardial effusion. Mediastinum/Nodes: No enlarged mediastinal, hilar, or axillary  lymph nodes. Thyroid gland, trachea, and esophagus demonstrate no significant findings. Lungs/Pleura: There is a 10.5 cm x 5.7 cm lobulated soft tissue mass seen along the left hilar and left suprahilar regions. There is no evidence of acute infiltrate, pleural effusion or pneumothorax. Upper Abdomen: There is evidence of prior stenting of the infrarenal abdominal aorta. Musculoskeletal: No chest wall abnormality. No acute or significant osseous findings. IMPRESSION: 1. 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 2. 3.6 cm x 2.4 cm area of low attenuation within the anterior aspect of the right atrium and adjacent portion of the left ventricle. While this is likely consistent with vascular invasion of the previously noted lung mass, a component of associated thrombus cannot be excluded. 3. Evidence of prior stenting of the infrarenal abdominal aorta. Electronically Signed: By: Virgina Norfolk M.D. On: 03/13/2020 19:05     LOS: 9 days   Oren Binet, MD  [facility]    To contact the attending provider between 7A-7P or the covering provider during after hours 7P-7A, please log into the web site [email or web address] and access using universal [place] password for that web site. If you do not have the password, please call the hospital operator.  03/15/2020, 11:09 AM

## 2020-03-15 NOTE — Progress Notes (Signed)
Patient ID: [name], male   DOB: 07-Nov-1945, 74 y.o.   MRN: [id number]      [address]       [place] [address]             [phone]                 4 Days Post-Op Procedure(s) (LRB): RIGHT LOWER EXTREMITY FASCIOTOMY WASHOUT AND CLOSURE (Right) TRANSESOPHAGEAL ECHOCARDIOGRAM (TEE)  LOS: 9 days   Subjective: Feels well this am, to start get mobilized   Objective: Vital signs in last 24 hours: Patient Vitals for the past 24 hrs:  BP Temp Temp src Pulse Resp SpO2 Height Weight  03/15/20 0800 126/77 98.4 F (36.9 C) Axillary 71 20 97 % -- --  03/15/20 0258 (!) 131/73 98.2 F (36.8 C) Oral 62 20 98 % 6\' 3"  (1.905 m) 87.9 kg  03/14/20 2312 (!) 138/78 98 F (36.7 C) Oral 64 16 98 % -- --  03/14/20 1932 (!) 129/67 98.7 F (37.1 C) Oral 63 20 98 % -- --  03/14/20 1614 -- 97.9 F (36.6 C) Oral -- -- -- -- --  03/14/20 1537 (!) 129/75 -- -- 66 21 100 % -- --  03/14/20 1124 (!) 135/78 98.7 F (37.1 C) Oral 68 14 98 % -- --    Filed Weights   03/13/20 0500 03/14/20 0434 03/15/20 0258  Weight: 90.2 kg 88.7 kg 87.9 kg    Hemodynamic parameters for last 24 hours:    Intake/Output from previous day: 07/23 0701 - 07/24 0700 In: 816.1 [I.V.:816.1] Out: 1050 [Urine:1050] Intake/Output this shift: No intake/output data recorded.  Scheduled Meds: . atorvastatin  20 mg Oral Daily  . clopidogrel  75 mg Oral Daily  . diltiazem  90 mg Oral Q6H  . docusate sodium  100 mg Oral Daily  . [place] Dalporto's butt cream   Topical TID  . insulin aspart  0-15 Units Subcutaneous TID WC  . pantoprazole (PROTONIX) IV  40 mg Intravenous Daily  . umeclidinium bromide  1 puff Inhalation Daily   Continuous Infusions: . sodium chloride Stopped (03/08/20 1008)  . heparin 1,250 Units/hr (03/15/20 1031)  . sodium chloride     PRN Meds:.sodium chloride, acetaminophen **OR** acetaminophen, albuterol, alum & mag hydroxide-simeth, guaiFENesin-dextromethorphan, metoprolol tartrate,  morphine injection, ondansetron, oxyCODONE, phenol, polyethylene glycol, white petrolatum  General appearance: alert and cooperative Neurologic: intact Heart: regular rate and rhythm, S1, S2 normal, no murmur, click, rub or gallop Extremities: feet viable   Lab Results: CBC: Recent Labs    03/14/20 0830 03/15/20 0246  WBC 9.2 7.7  HGB 8.4* 9.1*  HCT 26.7* 28.1*  PLT 147* 148*   BMET:  Recent Labs    03/13/20 0304 03/15/20 0246  NA 135 137  K 3.6 4.0  CL 102 104  CO2 23 23  GLUCOSE 134* 142*  BUN 10 10  CREATININE 0.99 0.92  CALCIUM 8.8* 9.0    PT/INR: No results for input(s): LABPROT, INR in the last 72 hours.   Radiology CT CHEST W CONTRAST  Addendum Date: 03/14/2020   ADDENDUM REPORT: 03/14/2020 13:45 ADDENDUM: Thoracic surgeon Dr. Servando Snare called for a second opinion regarding the location of the cardiac mass. The irregular soft tissue mass within the left atrial appendage, left superior pulmonary vein and left aspect of the left atrium extending to mitral valve and potentially to the entrance of the left ventricle is contiguous with the large central left upper lobe lung mass  and is favored to represent direct tumor invasion of these structures. While a component of bland thrombus is possible at the periphery of this cardiac mass, this is felt to largely represent direct cardiac tumor invasion. These results were discussed by telephone by Dr. Polly Cobia at 03/14/2020 at 1:44 pm with Dr. Servando Snare, who verbally acknowledged these results. Electronically Signed   By: Ilona Sorrel M.D.   On: 03/14/2020 13:45   Result Date: 03/14/2020 CLINICAL DATA:  Abnormal x-ray findings. EXAM: CT CHEST WITH CONTRAST TECHNIQUE: Multidetector CT imaging of the chest was performed during intravenous contrast administration. CONTRAST:  62mL OMNIPAQUE IOHEXOL 300 MG/ML  SOLN COMPARISON:  March 12, 2020 FINDINGS: Cardiovascular: The pulmonary arteries are limited in evaluation secondary to suboptimal  opacification with intravenous contrast. Normal heart size. A 3.6 cm x 2.4 cm area of low attenuation is seen within the anterior aspect of the right atrium. This extends from a large left suprahilar mass. Mild involvement of the left atrium is noted. No pericardial effusion. Mediastinum/Nodes: No enlarged mediastinal, hilar, or axillary lymph nodes. Thyroid gland, trachea, and esophagus demonstrate no significant findings. Lungs/Pleura: There is a 10.5 cm x 5.7 cm lobulated soft tissue mass seen along the left hilar and left suprahilar regions. There is no evidence of acute infiltrate, pleural effusion or pneumothorax. Upper Abdomen: There is evidence of prior stenting of the infrarenal abdominal aorta. Musculoskeletal: No chest wall abnormality. No acute or significant osseous findings. IMPRESSION: 1. 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 2. 3.6 cm x 2.4 cm area of low attenuation within the anterior aspect of the right atrium and adjacent portion of the left ventricle. While this is likely consistent with vascular invasion of the previously noted lung mass, a component of associated thrombus cannot be excluded. 3. Evidence of prior stenting of the infrarenal abdominal aorta. Electronically Signed: By: Virgina Norfolk M.D. On: 03/13/2020 19:05     Assessment/Plan: S/P Procedure(s) (LRB): RIGHT LOWER EXTREMITY FASCIOTOMY WASHOUT AND CLOSURE (Right) TRANSESOPHAGEAL ECHOCARDIOGRAM (TEE) CT surgery not planning on surgical intervention, as get path results confirmed will ned oncology consult- patient is agreeable to consider cancer treatment   Patient still at risk of tumour and or clot embolization  Consider changing to coumadin as previous emboli on noac    Grace Isaac MD 03/15/2020 10:43 AM

## 2020-03-15 NOTE — Progress Notes (Signed)
[facility]  Subjective  -no complaints.  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   Objective 126/77 71 98.4 F (36.9 C) (Axillary) 20 97%  Intake/Output Summary (Last 24 hours) at 03/15/2020 0944 Last data filed at 03/15/2020 0647 Gross per 24 hour  Intake 816.1 ml  Output 1050 ml  Net -233.9 ml    Right lower extremity fasciotomy incisions are clean dry intact and there is some desquamation on the medial incision Right peroneal signal brisk Left PT DP signal brisk  Laboratory Lab Results: Recent Labs    03/14/20 0830 03/15/20 0246  WBC 9.2 7.7  HGB 8.4* 9.1*  HCT 26.7* 28.1*  PLT 147* 148*   BMET Recent Labs    03/13/20 0304 03/15/20 0246  NA 135 137  K 3.6 4.0  CL 102 104  CO2 23 23  GLUCOSE 134* 142*  BUN 10 10  CREATININE 0.99 0.92  CALCIUM 8.8* 9.0    COAG Lab Results  Component Value Date   INR 1.3 (H) 03/06/2020   INR 1.1 01/15/2020   INR 1.0 01/14/2020   No results found for: PTT  Assessment/Planning:  74 year old male status post thrombectomy of bilateral limbs of EVAR with 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  Further work-up has now revealed that the thrombus sent to pathology is likely malignancy.  Apparently was diagnosed with lung cancer at the [place] and did not seek treatment last year.  CT chest shows large left upper lobe lung mass with likely invasion into the atrial appendage as well as the pulmonary vein and left atrium extending to the mitral valve.  Likely not a candidate for surgery per CT which seems appropriate.  Serotonin study negative for HIT.  Marty Heck 03/15/2020 9:44 AM --

## 2020-03-15 NOTE — Progress Notes (Addendum)
[place] for Bivalirudin >> heparin Indication: LA thrombus, occluded aortic stent graft, s/p OR   Allergies  Allergen Reactions  . Erythromycin Shortness Of Breath and Swelling  . Simvastatin Other (See Comments)    CAUSE MUSCLE PAIN,.  . Statins Other (See Comments)    Muscle pain    Patient Measurements: Height: 6\' 3"  (190.5 cm) Weight: 87.9 kg (193 lb 12.6 oz) IBW/kg (Calculated) : 84.5 HEPARIN DW (KG): 79 kg  Vital Signs: Temp: 98.2 F (36.8 C) (07/24 0258) Temp Source: Oral (07/24 0258) BP: 131/73 (07/24 0258) Pulse Rate: 62 (07/24 0258)  Labs: Recent Labs    03/13/20 0304 03/13/20 0304 03/14/20 0229 03/14/20 0830 03/15/20 0246  HGB 8.6*   < >  --  8.4* 9.1*  HCT 27.3*  --   --  26.7* 28.1*  PLT 139*  --   --  147* 148*  APTT 62*  --  59*  --  42*  CREATININE 0.99  --   --   --  0.92   < > = values in this interval not displayed.    Estimated Creatinine Clearance: 85.5 mL/min (by C-G formula based on SCr of 0.92 mg/dL).   Assessment:  74 y.o. 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 Patient was taken to OR for thrombectomy and intraop TEE revealed LA thrombus. Pharmacy initially consulted for IV heparin dosing, but was changed to bivalirudin for possible HIT. 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 He is s/p fasciotomy closure 7/20. Discussed with pathology that thrombus that was noted previous is a malignancy and not clot. Was previously on apixaban PTA.  APTT came back slightly subtherapeutic at 42, on 0.095 mg/kg/hr. Hgb 9.1, plt 148 (stable). SRA negative.   Discussed with MD teams and okay to change to heparin infusion at this time.   Goal of Therapy:  Heparin level: 0.3-0.7 sec Monitor platelets by  anticoagulation protocol: Yes  Plan:  Discontinue bivalirudin infusion  Start heparin infusion at 1250 units/hr  Get level in 6 hours  Monitor daily aPTT, CBC, s/sx bleeding  Antonietta Jewel, PharmD, [place] Pharmacist  Phone: [phone] 03/15/2020 7:35 AM  Please check AMION for all [place] phone numbers After 10:00 PM, call [place]   ADDENDUM  Discussed with Dr Sloan Leiter who spoke with Oncology who would recommending doing enoxaparin at discharge. Discontinue heparin order. Entered enoxaparin 1 mg/kg every 12 hours (to start 1 hour after heparin infusion discontinued) and will put in [facility] consult to check price.  Antonietta Jewel, PharmD, [place] Clinical Pharmacist  Phone: [phone] 03/15/2020 11:41 AM  Please check AMION for all [place] phone numbers After 10:00 PM, call [place] [phone]

## 2020-03-15 NOTE — Consult Note (Addendum)
[place] [phone]   Fax:([phone] [place] NOTE  Patient Care Team: Hagerman as PCP - General ([place]) [name], MD as PCP - Cardiology (Cardiology) Patient, No Pcp Per (General Practice)  CHIEF COMPLAINTS/PURPOSE OF CONSULTATION:  "Lung Mass with Extension into heart "  HISTORY OF PRESENTING ILLNESS:  [name] 74 y.o. male with medical history significant for DM type II, PVD, HTN, and asthma who presents with bilateral lower extremity ischemia and was found to have a lung/cardiac mass.   On review of the previous records Mr. [name] presented to the emergency department on 03/06/2020 with pain in his right lower extremity.  He noted that his right leg became painful and he had no sensation or movement in that extremity.  Of note he had previously presented the emergency department on 01/14/2020 with pain in his left leg and had to undergo a left common and external iliac thrombectomy and endovascular aneurysm repair on 01/15/2020 with Dr. Donzetta Matters.  On further evaluation he was found to have an occluded right and left limb of the aorto biiliac endograft and underwent an emergent thrombectomy and right lower extremity 4 compartment fasciotomy with intraoperative TEE.  The TEE revealed a mobile thrombus in the left atrium.  Subsequent CT imaging of the chest for surgical planning revealed a large left lung mass eroding into the mediastinum and into the pulmonary vein and left atrium.  The concern was that the patient's embolisms were being caused by this tumor.  Due to concern for this extension of a left lung mass into the heart oncology was consulted for further evaluation and management.   On exam today history [name] is accompanied by his wife.  He noted that he was first diagnosed with lung cancer in 2019 and was offered chemotherapy and radiation at that time.  He declined further treatment and noted that he did not  wish to pursue any treatment for this cancer.  The patient notes now in retrospect that he "did not understand the severity of the condition".  He reports that this has not caused him any trouble until this current hospitalization.  He notes that he may be interested in consideration of therapy at this time.  He states that he is not having any issues with shortness of breath, chest pain, nausea, vomiting, or diarrhea.  He does have lower extremity discomfort status post his vascular procedure.  He has had no recent issues with weight loss and denies having any headaches, vision changes, or focal neurological deficits.  A full 10 point ROS is listed below.  MEDICAL HISTORY:  Past Medical History:  Diagnosis Date  . Asthma   . Coronary artery disease   . Diabetes mellitus without complication ([place])   . History of kidney stones   . Hypertension   . Peripheral vascular disease ([place])     SURGICAL HISTORY: Past Surgical History:  Procedure Laterality Date  . AORTOGRAM  03/06/2020   Procedure: AORTOGRAM WITH BILATERAL ILIAC ARTERIOGRAM;  Surgeon: Marty Heck, MD;  Location: [facility];  Service: Vascular;;  . CARDIAC CATHETERIZATION N/A 07/05/2016   Procedure: Left Heart Cath and Coronary Angiography;  Surgeon: Sherren Mocha, MD;  Location: [facility];  Service: Cardiovascular;  Laterality: N/A;  . CORONARY ANGIOPLASTY    . EMBOLECTOMY Bilateral 03/06/2020   Procedure: THROMBECTOMY OF RIGHT LIMB OF BIFURCATED AORTO-BI-ILIAC STENT GRAFT; RIGHT LOWER EXTREMITY THROMBECTOMY USING THE FEMORAL APPROACH; REDO EXPOSURE LEFT COMMON FEMORAL  ARTERY; THROMBECTOMY LEFT LIMB OF AORTO-BI-ILIAC STENT GRAFT;  LEFT LOWER EXTREMITY THROMBECTOMY USING THE FEMORAL APPROACH;  Surgeon: Marty Heck, MD;  Location: [facility];  Service: Vascular;  Laterality: Bilateral;  . ENDOVASCULAR STENT INSERTION N/A 01/15/2020   Procedure: ENDOVASCULAR ANEURYSM REPAIR;  Surgeon: Waynetta Sandy, MD;   Location: [facility];  Service: Vascular;  Laterality: N/A;  . FASCIOTOMY Right 03/06/2020   Procedure: RIGHT LOWER EXTREMITY FOUR COMPARTMENT FASCIOTOMIES;  Surgeon: Marty Heck, MD;  Location: [facility];  Service: Vascular;  Laterality: Right;  . FASCIOTOMY CLOSURE Right 03/11/2020   Procedure: RIGHT LOWER EXTREMITY FASCIOTOMY WASHOUT AND CLOSURE;  Surgeon: Waynetta Sandy, MD;  Location: [facility];  Service: Vascular;  Laterality: Right;  . heart stents  2006  . INSERTION OF ILIAC STENT Left 01/15/2020   Procedure: INSERTION OF LEFT EXTERNAL ILIAC ARTERY STENT;  Surgeon: Waynetta Sandy, MD;  Location: [facility];  Service: Vascular;  Laterality: Left;  . LOWER EXTREMITY ANGIOGRAM  01/15/2020   Procedure: Left Lower Extremity Angiogram;  Surgeon: Waynetta Sandy, MD;  Location: [facility];  Service: Vascular;;  . TEE WITHOUT CARDIOVERSION  03/11/2020   Procedure: TRANSESOPHAGEAL ECHOCARDIOGRAM (TEE);  Surgeon: Waynetta Sandy, MD;  Location: [facility];  Service: Vascular;;  . THROMBECTOMY ILIAC ARTERY  01/15/2020   Procedure: THROMBECTOMY OF LEFT ILIAC ARTERY;  Surgeon: Waynetta Sandy, MD;  Location: [facility];  Service: Vascular;;    SOCIAL HISTORY: Social History   Socioeconomic History  . Marital status: Married    Spouse name: Not on file  . Number of children: Not on file  . Years of education: Not on file  . Highest education level: Not on file  Occupational History  . Not on file  Tobacco Use  . Smoking status: Former Research scientist (life sciences)  . Smokeless tobacco: Never Used  Vaping Use  . Vaping Use: Never used  Substance and Sexual Activity  . Alcohol use: No    Alcohol/week: 0.0 standard drinks  . Drug use: No  . Sexual activity: Not on file  Other Topics Concern  . Not on file  Social History Narrative  . Not on file   Social Determinants of Health   Financial Resource Strain:   . Difficulty of Paying Living Expenses:   Food Insecurity:   . Worried  About Charity fundraiser in the Last Year:   . Arboriculturist in the Last Year:   Transportation Needs:   . Film/video editor (Medical):   Marland Kitchen Lack of Transportation (Non-Medical):   Physical Activity:   . Days of Exercise per Week:   . Minutes of Exercise per Session:   Stress:   . Feeling of Stress :   Social Connections:   . Frequency of Communication with Friends and Family:   . Frequency of Social Gatherings with Friends and Family:   . Attends Religious Services:   . Active Member of Clubs or Organizations:   . Attends Archivist Meetings:   Marland Kitchen Marital Status:   Intimate Partner Violence:   . Fear of Current or Ex-Partner:   . Emotionally Abused:   Marland Kitchen Physically Abused:   . Sexually Abused:     FAMILY HISTORY: Family History  Problem Relation Age of Onset  . CAD Mother   . CAD Brother     ALLERGIES:  is allergic to erythromycin, simvastatin, and statins.  MEDICATIONS:  Current Facility-Administered Medications  Medication Dose Route Frequency Provider Last Rate  Last Admin  . 0.9 %  sodium chloride infusion   Intravenous PRN Dagoberto Ligas, PA-C   Stopped at 03/08/20 1008  . acetaminophen (TYLENOL) tablet 325-650 mg  325-650 mg Oral Q4H PRN Dagoberto Ligas, PA-C   650 mg at 03/12/20 1817   Or  . acetaminophen (TYLENOL) suppository 325-650 mg  325-650 mg Rectal Q4H PRN Dagoberto Ligas, PA-C      . albuterol (PROVENTIL) (2.5 MG/3ML) 0.083% nebulizer solution 2.5 mg  2.5 mg Nebulization Q4H PRN Dagoberto Ligas, PA-C   2.5 mg at 03/13/20 1654  . alum & mag hydroxide-simeth (MAALOX/MYLANTA) 200-200-20 MG/5ML suspension 15-30 mL  15-30 mL Oral Q2H PRN Dagoberto Ligas, PA-C      . atorvastatin (LIPITOR) tablet 20 mg  20 mg Oral Daily Dagoberto Ligas, PA-C   20 mg at 03/15/20 1026  . clopidogrel (PLAVIX) tablet 75 mg  75 mg Oral Daily Dagoberto Ligas, PA-C   75 mg at 03/15/20 1023  . diltiazem (CARDIZEM) tablet 90 mg  90 mg Oral Q6H Arrien, Jimmy Picket, MD   90 mg at 03/15/20 1150  . docusate sodium (COLACE) capsule 100 mg  100 mg Oral Daily Dagoberto Ligas, PA-C   100 mg at 03/15/20 1023  . [START ON 03/16/2020] enoxaparin (LOVENOX) injection 87.5 mg  1 mg/kg Subcutaneous Q12H Ghimire, Henreitta Leber, MD      . Gerhardt's butt cream   Topical TID Jonetta Osgood, MD   Given at 03/14/20 2144  . guaiFENesin-dextromethorphan (ROBITUSSIN DM) 100-10 MG/5ML syrup 15 mL  15 mL Oral Q4H PRN Dagoberto Ligas, PA-C      . insulin aspart (novoLOG) injection 0-15 Units  0-15 Units Subcutaneous TID WC Dagoberto Ligas, PA-C   3 Units at 03/15/20 1152  . metoprolol tartrate (LOPRESSOR) injection 2-5 mg  2-5 mg Intravenous Q2H PRN Dagoberto Ligas, PA-C   5 mg at 03/11/20 1307  . morphine 2 MG/ML injection 2-5 mg  2-5 mg Intravenous Q1H PRN Dagoberto Ligas, PA-C   2 mg at 03/12/20 1650  . ondansetron (ZOFRAN) injection 4 mg  4 mg Intravenous Q6H PRN Dagoberto Ligas, PA-C      . oxyCODONE (Oxy IR/ROXICODONE) immediate release tablet 5-10 mg  5-10 mg Oral Q4H PRN Dagoberto Ligas, PA-C   10 mg at 03/15/20 0813  . pantoprazole (PROTONIX) injection 40 mg  40 mg Intravenous Daily Dagoberto Ligas, PA-C   40 mg at 03/15/20 1023  . phenol (CHLORASEPTIC) mouth spray 1 spray  1 spray Mouth/Throat PRN Dagoberto Ligas, PA-C   1 spray at 03/07/20 0419  . polyethylene glycol (MIRALAX / GLYCOLAX) packet 17 g  17 g Oral Daily PRN Dagoberto Ligas, PA-C      . sodium chloride 0.9 % bolus 1,000 mL  1,000 mL Intravenous Once Dagoberto Ligas, PA-C      . umeclidinium bromide (INCRUSE ELLIPTA) 62.5 MCG/INH 1 puff  1 puff Inhalation Daily Dagoberto Ligas, PA-C   1 puff at 03/15/20 1000  . white petrolatum (VASELINE) gel   Topical PRN Dagoberto Ligas, PA-C   0.2 application at 41/93/79 0240    REVIEW OF SYSTEMS:   Constitutional: ( - ) fevers, ( - )  chills , ( - ) night sweats Eyes: ( - ) blurriness of vision, ( - ) double vision, ( - ) watery eyes Ears, nose, mouth,  throat, and face: ( - ) mucositis, ( - ) sore throat Respiratory: ( - ) cough, ( - ) dyspnea, ( - ) wheezes Cardiovascular: ( - )  palpitation, ( - ) chest discomfort, ( - ) lower extremity swelling Gastrointestinal:  ( - ) nausea, ( - ) heartburn, ( - ) change in bowel habits Skin: ( - ) abnormal skin rashes Lymphatics: ( - ) new lymphadenopathy, ( - ) easy bruising Neurological: ( - ) numbness, ( - ) tingling, ( - ) new weaknesses Behavioral/Psych: ( - ) mood change, ( - ) new changes  All other systems were reviewed with the patient and are negative.  PHYSICAL EXAMINATION: ECOG PERFORMANCE STATUS: 3 - Symptomatic, >50% confined to bed  Vitals:   03/15/20 0258 03/15/20 0800  BP: (!) 131/73 126/77  Pulse: 62 71  Resp: 20 20  Temp: 98.2 F (36.8 C) 98.4 F (36.9 C)  SpO2: 98% 97%   Filed Weights   03/13/20 0500 03/14/20 0434 03/15/20 0258  Weight: 198 lb 13.7 oz (90.2 kg) 195 lb 8.8 oz (88.7 kg) 193 lb 12.6 oz (87.9 kg)    GENERAL: well appearing elderly African American male in NAD  SKIN: skin color, texture, turgor are normal, no rashes or significant lesions EYES: conjunctiva are pink and non-injected, sclera clear LUNGS: clear to auscultation and percussion with normal breathing effort HEART: regular rate & rhythm and no murmurs and no lower extremity edema Musculoskeletal: no cyanosis of digits and no clubbing  PSYCH: alert & oriented x 3, fluent speech NEURO: no focal motor/sensory deficits  LABORATORY DATA:  I have reviewed the data as listed CBC Latest Ref Rng & Units 03/15/2020 03/14/2020 03/13/2020  WBC 4.0 - 10.5 K/uL 7.7 9.2 10.6(H)  Hemoglobin 13.0 - 17.0 g/dL 9.1(L) 8.4(L) 8.6(L)  Hematocrit 39 - 52 % 28.1(L) 26.7(L) 27.3(L)  Platelets 150 - 400 K/uL 148(L) 147(L) 139(L)    CMP Latest Ref Rng & Units 03/15/2020 03/13/2020 03/12/2020  Glucose 70 - 99 mg/dL 142(H) 134(H) 155(H)  BUN 8 - 23 mg/dL 10 10 12   Creatinine 0.61 - 1.24 mg/dL 0.92 0.99 1.06  Sodium 135 -  145 mmol/L 137 135 136  Potassium 3.5 - 5.1 mmol/L 4.0 3.6 4.0  Chloride 98 - 111 mmol/L 104 102 103  CO2 22 - 32 mmol/L 23 23 24   Calcium 8.9 - 10.3 mg/dL 9.0 8.8(L) 9.0  Total Protein 6.5 - 8.1 g/dL - - -  Total Bilirubin 0.3 - 1.2 mg/dL - - -  Alkaline Phos 38 - 126 U/L - - -  AST 15 - 41 U/L - - -  ALT 0 - 44 U/L - - -     PATHOLOGY: Evaluation of thrombus from thrombectomy is pending, preliminary concerning for malignancy.   RADIOGRAPHIC STUDIES: I have personally reviewed the radiological images as listed and agreed with the findings in the report: Large mass in the left lung with direct invasion of the heart.  No other evidence of metastatic disease elsewhere.  DG Abd 1 View  Result Date: 03/06/2020 CLINICAL DATA:  Arteriogram. EXAM: ABDOMEN - 1 VIEW COMPARISON:  March 06, 2020 FINDINGS: The patient has undergone abdominal aortic angiogram. An endograft is noted. The left limb of the graft appears to be patent. Several images demonstrate non patency of the right limb. IMPRESSION: The patient has undergone abdominal aortic angiogram. Please see separate operative report for complete details. Electronically Signed   By: Constance Holster M.D.   On: 03/06/2020 19:49   CT CHEST W CONTRAST  Addendum Date: 03/14/2020   ADDENDUM REPORT: 03/14/2020 13:45 ADDENDUM: Thoracic surgeon Dr. Servando Snare called for a second opinion regarding the  location of the cardiac mass. The irregular soft tissue mass within the left atrial appendage, left superior pulmonary vein and left aspect of the left atrium extending to mitral valve and potentially to the entrance of the left ventricle is contiguous with the large central left upper lobe lung mass and is favored to represent direct tumor invasion of these structures. While a component of bland thrombus is possible at the periphery of this cardiac mass, this is felt to largely represent direct cardiac tumor invasion. These results were discussed by telephone  by Dr. Polly Cobia at 03/14/2020 at 1:44 pm with Dr. Servando Snare, who verbally acknowledged these results. Electronically Signed   By: Ilona Sorrel M.D.   On: 03/14/2020 13:45   Result Date: 03/14/2020 CLINICAL DATA:  Abnormal x-ray findings. EXAM: CT CHEST WITH CONTRAST TECHNIQUE: Multidetector CT imaging of the chest was performed during intravenous contrast administration. CONTRAST:  2mL OMNIPAQUE IOHEXOL 300 MG/ML  SOLN COMPARISON:  March 12, 2020 FINDINGS: Cardiovascular: The pulmonary arteries are limited in evaluation secondary to suboptimal opacification with intravenous contrast. Normal heart size. A 3.6 cm x 2.4 cm area of low attenuation is seen within the anterior aspect of the right atrium. This extends from a large left suprahilar mass. Mild involvement of the left atrium is noted. No pericardial effusion. Mediastinum/Nodes: No enlarged mediastinal, hilar, or axillary lymph nodes. Thyroid gland, trachea, and esophagus demonstrate no significant findings. Lungs/Pleura: There is a 10.5 cm x 5.7 cm lobulated soft tissue mass seen along the left hilar and left suprahilar regions. There is no evidence of acute infiltrate, pleural effusion or pneumothorax. Upper Abdomen: There is evidence of prior stenting of the infrarenal abdominal aorta. Musculoskeletal: No chest wall abnormality. No acute or significant osseous findings. IMPRESSION: 1. 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 2. 3.6 cm x 2.4 cm area of low attenuation within the anterior aspect of the right atrium and adjacent portion of the left ventricle. While this is likely consistent with vascular invasion of the previously noted lung mass, a component of associated thrombus cannot be excluded. 3. Evidence of prior stenting of the infrarenal abdominal aorta. Electronically Signed: By: Virgina Norfolk M.D. On: 03/13/2020 19:05   CT ANGIO AO+BIFEM W & OR WO CONTRAST  Result Date: 03/06/2020 CLINICAL DATA:   73 year old with sudden onset of right leg weakness. Abdominal aortic aneurysm repair 1 month ago. EXAM: CT ANGIOGRAPHY OF ABDOMINAL AORTA WITH ILIOFEMORAL RUNOFF TECHNIQUE: Multidetector CT imaging of the abdomen, pelvis and lower extremities was performed using the standard protocol during bolus administration of intravenous contrast. Multiplanar CT image reconstructions and MIPs were obtained to evaluate the vascular anatomy. CONTRAST:  111mL OMNIPAQUE IOHEXOL 350 MG/ML SOLN COMPARISON:  CTA 01/16/2020 and 01/14/2020 FINDINGS: VASCULAR Heart: There is concern for filling defects in the left atrium that could represent thrombus. This area is incompletely evaluated. This area of concern is seen on sequence 5, image 1. Aorta: Normal appearance of the distal descending thoracic aorta. Again noted is an aortic stent graft in the infrarenal aorta. Main body of the stent graft is patent but the right limb of the stent graft is occluded. Left limb of the graft remains patent. The abdominal aortic aneurysm sac measures 4.8 x 4.4 cm and previously measured 5.1 x 4.5 cm. No obvious endoleak on this arterial phase of imaging. No stranding or fluid around the aneurysm sac. Celiac: Celiac trunk and main branch vessels are patent. SMA: Mild atherosclerotic disease at  the origin without significant stenosis. Main branches of the SMA are patent. Renals: Single bilateral renal arteries are patent. IMA: Origin of the IMA is occluded due to the aneurysm sac. Evidence for reconstitution beyond the origin. RIGHT Lower Extremity Inflow: Right limb of the aortic stent graft is occluded. This represents a new finding. Chronic stenosis at the origin of the right internal iliac artery. Right external iliac artery is patent. Outflow: Flow postsurgical changes in the right groin. The right common femoral artery has atherosclerotic disease but patent. Right profunda femoral artery is patent. Right SFA is patent with areas of at least mild  narrowing. Again noted is a short segment narrowing in the distal right SFA measuring close to 50% stenosis. These findings are unchanged. Right popliteal artery is patent. Again noted is focal narrowing in the right popliteal artery just above the knee joint and this may be related to a chronic dissection in this area. Degree of stenosis appears to be greater than 50% at the popliteal artery dissection. Runoff: Primary runoff is the peroneal artery which is similar to the previous examination. Small amount of flow in the proximal anterior tibial artery and there may be decreased flow along the anterior tibial artery collateral distribution. This may be related to a small amount of thrombus or slow flow. Posterior tibial occludes in the mid calf and this is similar to the previous examination. Previously, there was distal reconstitution of the posterior tibial artery which is not clearly identified on this examination. LEFT Lower Extremity Inflow: Left aortic stent graft limb is patent. Left common and external iliac artery stents remain patent. Outflow: Postsurgical changes around the left common femoral artery. Left common femoral artery is patent. Left profunda femoral arteries are patent. Left SFA is patent with mild areas of narrowing. Stable appearance of the left SFA. Left popliteal artery is patent Runoff: Again noted is narrowing at the origin of the left anterior tibial artery. Left anterior tibial artery occludes in the proximal calf and there may be some areas of segmental reconstitution. Tibioperoneal trunk is patent. Peroneal artery is patent and there may be improved flow in the peroneal artery compared to the previous examination. Previously, there was a segment of the peroneal artery that was occluded. Posterior tibial artery occludes in the proximal calf. Evidence for segmental reconstitution of the posterior tibial artery. Veins: No obvious venous abnormality within the limitations of this  arterial phase study. Review of the MIP images confirms the above findings. NON-VASCULAR Lower chest: Lung bases are clear without pleural effusions. Hepatobiliary: Gallbladder is decompressed. Main portal veins are patent. Mild dilatation of the proximal extrahepatic bile duct measuring up to 1.3 cm and unchanged. Distal common bile duct measures roughly 0.5 cm and similar to the previous examination. Pancreas: Unremarkable. No pancreatic ductal dilatation or surrounding inflammatory changes. Spleen: Normal in size without focal abnormality. Adrenals/Urinary Tract: Normal appearance of the adrenal glands. Negative for hydronephrosis. Probable small renal cysts. Urinary bladder is unremarkable. Stomach/Bowel: Stomach is within normal limits. Appendix appears normal. No evidence of bowel wall thickening, distention, or inflammatory changes. Lymphatic: No significant lymph node enlargement in the abdomen or pelvis. Reproductive: Prostate is mildly prominent measuring 4.8 cm in transverse dimension. Other: Negative for ascites.  Negative for free air. Musculoskeletal: No acute bone abnormality. IMPRESSION: VASCULAR 1. Right limb of the bifurcated aortic stent graft is occluded. This is a new finding and consistent with patient's right leg symptoms. 2. Concern for filling defects in the left atrium. This  could represent left atrial thrombus and source for embolic disease. 3. Right runoff has slightly changed since 01/14/2020. There is now single-vessel runoff from the peroneal artery. Previously, there was flow at the ankle from the posterior tibial artery. The lack of flow in the distal posterior tibial artery could be related to slow flow from the occluded stent limb but cannot exclude embolic disease involving the right runoff vessels. 4. No significant change in the right outflow disease. Right SFA and right popliteal artery remain patent. There continues to be focal narrowing in the right popliteal artery possibly  related to a dissection. 5. Left iliac artery stents remain patent. No significant change in the left outflow or runoff vessels. NON-VASCULAR 1. No acute abnormality in the abdomen or pelvis. These results were called by telephone at the time of interpretation on 03/06/2020 at 3:30 pm to provider Monica Martinez, MD, who verbally acknowledged these results. Electronically Signed   By: Markus Daft M.D.   On: 03/06/2020 16:13   CT CORONARY MORPH W/CTA COR W/SCORE W/CA W/CM &/OR WO/CM  Addendum Date: 03/13/2020   ADDENDUM REPORT: 03/13/2020 09:16 EXAM: OVER-READ INTERPRETATION  CT CHEST The following report is an over-read performed by radiologist Dr. Norlene Duel [facility] on 03/13/2020. This over-read does not include interpretation of cardiac or coronary anatomy or pathology. The coronary CTA and coronary calcium score interpretation by the cardiologist is attached. COMPARISON:  CTA 01/16/2020 FINDINGS: Vascular: Normal heart size.  No pericardial effusion identified. Mediastinum/nodes: Dilated and patulous esophagus. Lungs/pleura: There is a large, left upper lobe, perihilar lung mass which measures 8.9 x 5.5 cm, image 3/11. This mass encases the left hilum and occludes the left upper lobe and lingular bronchi with signs of postobstructive pneumonitis in the left upper lobe. The mass invades the left upper lobe pulmonary vein with tumor thrombus identified in the left atrium. The tumor thrombus protrudes through the mitral valve into the left ventricle. Upper abdomen: No acute abnormality. Musculoskeletal: No acute abnormality. IMPRESSION: 1. 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 This encases the left hilum and occludes the left upper lobe and lingular bronchi with signs of postobstructive pneumonitis in the left upper lobe. The mass invades the left upper lobe pulmonary vein with tumor thrombus in the left atrium, extending  through the mitral valve. Recommend further evaluation with PET-CT. Electronically Signed   By: Kerby Moors M.D.   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 EXAM: Cardiac/Coronary  CTA TECHNIQUE: The patient was scanned on a [employer]. FINDINGS: A 100 kV prospective scan was triggered in the descending thoracic aorta at 111 HU's. Axial non-contrast 3 mm slices were carried out through the heart. The data set was analyzed on a dedicated work station and scored using the [place]. Gantry rotation speed was 250 msecs and collimation was .6 mm. No beta blockade and 0.8 mg of sl NTG was given. The 3D data set was reconstructed in 5% intervals of the 67-82 % of the R-R cycle. Diastolic phases were analyzed on a dedicated work station using MPR, MIP and VRT modes. The patient received 80 cc of contrast. Aorta: Normal size. There is moderate to severe diffuse atherosclerotic plaque with mild calcifications. No dissection. Aortic Valve:  Trileaflet.  No calcifications. Coronary  Arteries: Normal coronary origin. Right dominance. Coronary artery evaluation is affected by significant motion and poor vasodilation and therefore not interpretable. There are two stents in the proximal to mid RCA. 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 The thrombus is highly mobile with high risk of embolization. It is partially protruding into the left ventricle across the mitral valve and partially obstructing the flow to the left ventricle. The thrombus is only partially extending into the left atrial appendage. Dilated pulmonary artery suggestive of pulmonary hypertension. IMPRESSION: 1. Coronary Arteries: Normal coronary origin. Right  dominance. Coronary artery evaluation is affected by significant motion and poor vasodilation and therefore not interpretable. There are two stents in the proximal to mid RCA. 2. There is moderate to severe diffuse atherosclerotic plaque with mild calcifications. No dissection. 3. 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 The thrombus is highly mobile with high risk of embolization. It is partially protruding into the left ventricle across the mitral valve and partially obstructing the flow to the left ventricle. The thrombus is only partially extending into the left atrial appendage. 4. Dilated pulmonary artery suggestive of pulmonary hypertension. Electronically Signed: By: Ena Dawley On: 03/12/2020 18:57   DG C-Arm 1-60 Min  Result Date: 03/06/2020 CLINICAL DATA:  Angiogram EXAM: DG C-ARM 1-60 MIN FLUOROSCOPY TIME:  Fluoroscopy Time:  3 minutes and 28 seconds Radiation Exposure Index (if provided by the fluoroscopic device): 74.67 mGy Number of Acquired Spot Images: 6 COMPARISON:  March 06, 2020 CT FINDINGS: The patient has undergone abdominal aortic angiogram. An endograft is noted. The left limb of the graft appears to be patent. Several images demonstrate non patency of the right limb. IMPRESSION: The patient has undergone angiogram. Please see separate operative report for complete intraoperative details. Electronically Signed   By: Constance Holster M.D.   On: 03/06/2020 19:50   ECHO INTRAOPERATIVE TEE  Result Date: 03/11/2020  *INTRAOPERATIVE TRANSESOPHAGEAL REPORT *  Patient Name:   KELYN KOSKELA  Date of Exam: 03/11/2020 Medical Rec #:  [id number]      Height:       75.0 in Accession #:    [id number]     Weight:       194.0 lb Date of Birth:  01-Sep-1945       BSA:          2.17 m Patient Age:    74 years       BP:           137/74 mmHg Patient Gender: M               HR:           75 bpm. Exam Location:  Anesthesiology Transesophogeal exam was perform intraoperatively during surgical procedure. Patient was closely monitored under general anesthesia during the entirety of examination. Indications:     left atrial thrombus Sonographer:     Jannett Celestine RDCS (AE) Performing Phys: Annye Asa MD Diagnosing Phys: Annye Asa MD Report CC'd to:  Servando Snare MD Complications: No known complications during this procedure. PRE-OP FINDINGS  Left Ventricle: The left ventricle has low normal systolic function, with an ejection fraction of 50-55% 52%. The cavity size was normal. There is no  increase in left ventricular wall thickness. No evidence of left ventricular regional wall motion abnormalities. Right Ventricle: The right ventricle has normal systolic function. The cavity was normal. There is no increase in right ventricular wall thickness. Left Atrium: Left atrial size was mildly dilated. There is a very large, lobulated, mobile thrombus present in the L atrium. The atrial portion measures 2.58 x3.04 cm, however, the mass appears to extend well into the L superior pulmonary vein. It most likely obstructs flow from the L superior pulmonary vein. The thrombus does traverse the mitral apparatus during diastole, but it does not seem to affect forward flow. The left atrial appendage is well visualized and there is no evidence of thrombus present in it. Left atrial appendage velocity is normal at greater than 40 cm/s. Right Atrium: Right atrial size was normal in size. Right atrial pressure is estimated at 10 mmHg. Interatrial Septum: No atrial level shunt detected by color flow Doppler. Pericardium: There is no evidence of pericardial effusion. Mitral Valve: The mitral valve is normal in structure. No thickening of the mitral valve leaflet. No calcification of the mitral valve leaflet. Mitral valve regurgitation is not visualized by color flow Doppler. There is no evidence  of mitral valve vegetation. There is no evidence of mitral stenosis. Tricuspid Valve: The tricuspid valve was normal in structure. Tricuspid valve regurgitation is trivial by color flow Doppler. There is no evidence of tricuspid valve vegetation. Aortic Valve: The aortic valve is tricuspid Aortic valve regurgitation was not visualized by color flow Doppler. There is no evidence of aortic valve stenosis. There is no evidence of a vegetation on the aortic valve. Pulmonic Valve: The pulmonic valve was normal in structure, with normal leaflet excursion. No evidence of pulmonic stenosis. Pulmonic valve regurgitation is trivial by color flow Doppler. Aorta: The aortic root, ascending aorta and aortic arch are normal in size and structure. There is evidence of scattered layered plaque in the descending aorta; Grade I, measuring 1-75mm in size. Pulmonary Artery: The pulmonary artery is of normal size. Venous: The inferior vena cava was not well visualized, but appears normal in size. +--------------+-------++ LEFT VENTRICLE        +--------------+-------++ PLAX 2D               +--------------+-------++ LVIDd:        4.13 cm +--------------+-------++ LVIDs:        3.03 cm +--------------+-------++ LV SV:        40 ml   +--------------+-------++ LV SV Index:  18.36   +--------------+-------++                       +--------------+-------++ +-------------+-----------++ AORTIC VALVE             +-------------+-----------++ AV Vmax:     143.00 cm/s +-------------+-----------++ AV Vmean:    94.100 cm/s +-------------+-----------++ AV VTI:      0.208 m     +-------------+-----------++ AV Peak Grad:8.2 mmHg    +-------------+-----------++ AV Mean Grad:4.0 mmHg    +-------------+-----------++ +-------------+---------++ MITRAL VALVE           +-------------+---------++ MV Peak grad:5.7 mmHg  +-------------+---------++ MV Mean grad:2.0 mmHg  +-------------+---------++ MV  Vmax:     1.19 m/s  +-------------+---------++ MV Vmean:    68.8 cm/s +-------------+---------++ MV VTI:      0.32 m    +-------------+---------++  Annye Asa MD Electronically signed by Annye Asa MD Signature Date/Time: 03/11/2020/2:09:20 PM    Final  ECHO INTRAOPERATIVE TEE  Result Date: 03/07/2020  *INTRAOPERATIVE TRANSESOPHAGEAL REPORT *  Patient Name:   JANSEL VONSTEIN Date of Exam: 03/06/2020 Medical Rec #:  [id number]     Height:       75.0 in Accession #:    [id number]    Weight:       174.2 lb Date of Birth:  30-Oct-1945      BSA:          2.07 m Patient Age:    58 years      BP:           113/72 mmHg Patient Gender: M             HR:           83 bpm. Exam Location:  Inpatient Transesophogeal exam was perform intraoperatively during surgical procedure. Patient was closely monitored under general anesthesia during the entirety of examination. Indications:     Left atrial thrombus Performing Phys: [id number] Gwenyth Allegra CLARK Diagnosing Phys: Albertha Ghee MD Complications: No known complications during this procedure. POST-OP IMPRESSIONS Overall, there were no significant changes from pre-bypass. PRE-OP FINDINGS  Left Ventricle: The left ventricle has normal systolic function, with an ejection fraction of 55-60%. The cavity size was normal. There is mild concentric left ventricular hypertrophy. No evidence of left ventricular regional wall motion abnormalities. Right Ventricle: The right ventricle has normal systolic function. The cavity was normal. There is no increase in right ventricular wall thickness. Left Atrium: Left atrial size was normal in size. There is a large, mobile, lobulated mass in the LA. The echogenic character is largely homogenous. The edges are smooth and rounded. This is likely a thrombus. It measures approximately 10cm x 5cm. It appears anchored deep within the LUPV and the remaining portion is freely mobile. The most distal portion of the mass extends through  the mitral valve during diastole. No significant inflow obstruction is seen. The left atrial appendage is well visualized and there is no evidence of thrombus present. Right Atrium: Right atrial size was normal in size. Right atrial pressure is estimated at 10 mmHg. Interatrial Septum: No atrial level shunt detected by color flow Doppler. Pericardium: There is no evidence of pericardial effusion. Mitral Valve: The mitral valve is normal in structure. Mitral valve regurgitation is trivial by color flow Doppler. Tricuspid Valve: The tricuspid valve was normal in structure. Tricuspid valve regurgitation is trivial by color flow Doppler. Aortic Valve: The aortic valve is normal in structure. Aortic valve regurgitation was not visualized by color flow Doppler. There is no evidence of aortic valve stenosis. Pulmonic Valve: The pulmonic valve was normal in structure, with normal. Pulmonic valve regurgitation is not visualized by color flow Doppler. Aorta: The ascending aorta and aortic root are normal in size and structure. There is evidence of atheroma immobile plaque in the descending aorta and aortic arch; Grade IV, measuring >103mm in size.  Albertha Ghee MD Electronically signed by Albertha Ghee MD Signature Date/Time: 03/07/2020/9:21:44 AM    Final    VAS US CAROTID  Result Date: 03/12/2020 Carotid Arterial Duplex Study Indications:       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 Risk Factors:      Hypertension, Diabetes, coronary artery disease. Comparison Study:  No prior studies. Performing Technologist: Darlin Coco  Examination Guidelines: A complete evaluation includes B-mode imaging, spectral Doppler, color Doppler, and power Doppler as needed of all accessible portions of each vessel. Bilateral testing is considered an integral  part of a complete examination. Limited examinations for reoccurring indications may be performed as noted.  Right Carotid Findings:  +----------+--------+--------+--------+-----------------------+--------+           PSV cm/sEDV cm/sStenosisPlaque Description     Comments +----------+--------+--------+--------+-----------------------+--------+ CCA Prox  63      12              heterogenous and smooth         +----------+--------+--------+--------+-----------------------+--------+ CCA Mid   100     21              heterogenous and smooth         +----------+--------+--------+--------+-----------------------+--------+ CCA Distal80      13              heterogenous and smooth         +----------+--------+--------+--------+-----------------------+--------+ ICA Prox  47      13      1-39%   heterogenous                    +----------+--------+--------+--------+-----------------------+--------+ ICA Distal52      20                                              +----------+--------+--------+--------+-----------------------+--------+ ECA       51      9                                               +----------+--------+--------+--------+-----------------------+--------+ +----------+--------+-------+----------------+-------------------+           PSV cm/sEDV cmsDescribe        Arm Pressure (mmHG) +----------+--------+-------+----------------+-------------------+ Subclavian               Multiphasic, WNL                    +----------+--------+-------+----------------+-------------------+ +---------+--------+--+--------+--+---------+ VertebralPSV cm/s73EDV cm/s22Antegrade +---------+--------+--+--------+--+---------+  Left Carotid Findings: +----------+--------+--------+--------+-----------------------+--------+           PSV cm/sEDV cm/sStenosisPlaque Description     Comments +----------+--------+--------+--------+-----------------------+--------+ CCA Prox  97      20              heterogenous                     +----------+--------+--------+--------+-----------------------+--------+ CCA Mid   93      19              heterogenous and smooth         +----------+--------+--------+--------+-----------------------+--------+ CCA Distal104     25              heterogenous and smooth         +----------+--------+--------+--------+-----------------------+--------+ ICA Prox  59      10      1-39%   heterogenous                    +----------+--------+--------+--------+-----------------------+--------+ ICA Distal92      34                                              +----------+--------+--------+--------+-----------------------+--------+ ECA  97      13                                              +----------+--------+--------+--------+-----------------------+--------+ +----------+--------+--------+----------------+-------------------+           PSV cm/sEDV cm/sDescribe        Arm Pressure (mmHG) +----------+--------+--------+----------------+-------------------+ Subclavian110             Multiphasic, WNL                    +----------+--------+--------+----------------+-------------------+ +---------+--------+--+--------+--+---------+ VertebralPSV cm/s93EDV cm/s29Antegrade +---------+--------+--+--------+--+---------+   Summary: Right Carotid: Velocities in the right ICA are consistent with a 1-39% stenosis.                Non-hemodynamically significant plaque <50% noted in the CCA. Left Carotid: Velocities in the left ICA are consistent with a 1-39% stenosis.               Non-hemodynamically significant plaque <50% noted in the CCA.  *See table(s) above for measurements and observations.  Electronically signed by Ruta Hinds MD on 03/12/2020 at 6:14:00 PM.    Final     ASSESSMENT & PLAN [name] 74 y.o. male with medical history significant for DM type II, PVD, HTN, and asthma who presents with bilateral lower extremity ischemia and was found to have a  lung/cardiac mass.  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  This is an extraordinarily rare phenomena and it is unclear what type of malignancy is causing this, though the differential would be lung primary versus some form of sarcoma.  At this time the key to diagnosis we will be obtaining a pathological evaluation.  Reportedly the patient knew he had a lung mass which was worked up at the [facility] previously.  A next best step would be to obtain this information to assure that we do not do any redundant work-up.  Additionally he had the thrombus extracted during his thrombectomy evaluated by pathology and there are concerning preliminary findings for malignancy.  At this time would recommend for these 2 pieces of the work-up to return prior to considering further diagnostic work-up.  In the event that no diagnosis can be confirmed we would need to consider bronchoscopy with biopsy of the lung mass.  At this time I would recommend continued anticoagulation with Lovenox therapy in the setting of the tumor thrombus.  I am concerned the patient may not be a treatment candidate given the extensive invasion of the heart as well as the high risk of developing further thromboembolic events.  We will have a better idea of the prognosis and the treatment options moving forward once the histological diagnosis has been obtained.  #Lung/Cardiac Mass. 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 This is contiguous with a left upper lobe lung mass.  --please obtain an MRI brain and CT of the abdomen/pelvis w contrast to complete the staging workup.  --f/u the pending pathology from the thrombus obtaining during his vascular procedure --recommend therapeutic dose Lovenox as  anticoagulation in the setting of a tumor thrombus. Patient failed Eliquis therapy. Anticoagulation may not prevent future thromboembolic  events given the direct invasion.  --obtain records from the [facility] regarding prior workup for his lung mass. Can consider bronchoscopy for tumor sampling if outside sample was non-diagnostic.  --pending the return of his pathology results will need to have goals of care discussions.   All questions were answered. The patient knows to call the clinic with any problems, questions or concerns.  A total of more than 55 minutes were spent on this encounter and over half of that time was spent on counseling and coordination of care as outlined above.   Ledell Peoples, MD Department of Hematology/Oncology [place] at [facility] Phone: [phone] Pager: [phone] Email: [email or web address]  03/15/2020 1:46 PM

## 2020-03-16 DIAGNOSIS — I10 Essential (primary) hypertension: Secondary | ICD-10-CM | POA: Diagnosis not present

## 2020-03-16 DIAGNOSIS — I480 Paroxysmal atrial fibrillation: Secondary | ICD-10-CM | POA: Diagnosis not present

## 2020-03-16 DIAGNOSIS — I513 Intracardiac thrombosis, not elsewhere classified: Secondary | ICD-10-CM | POA: Diagnosis not present

## 2020-03-16 DIAGNOSIS — I998 Other disorder of circulatory system: Secondary | ICD-10-CM | POA: Diagnosis not present

## 2020-03-16 LAB — CBC
HCT: 28.5 % — ABNORMAL LOW (ref 39.0–52.0)
Hemoglobin: 9 g/dL — ABNORMAL LOW (ref 13.0–17.0)
MCH: 27.5 pg (ref 26.0–34.0)
MCHC: 31.6 g/dL (ref 30.0–36.0)
MCV: 87.2 fL (ref 80.0–100.0)
Platelets: 160 10*3/uL (ref 150–400)
RBC: 3.27 MIL/uL — ABNORMAL LOW (ref 4.22–5.81)
RDW: 13.5 % (ref 11.5–15.5)
WBC: 8.3 10*3/uL (ref 4.0–10.5)
nRBC: 0 % (ref 0.0–0.2)

## 2020-03-16 LAB — GLUCOSE, CAPILLARY
Glucose-Capillary: 140 mg/dL — ABNORMAL HIGH (ref 70–99)
Glucose-Capillary: 154 mg/dL — ABNORMAL HIGH (ref 70–99)
Glucose-Capillary: 196 mg/dL — ABNORMAL HIGH (ref 70–99)
Glucose-Capillary: 101 mg/dL — ABNORMAL HIGH (ref 70–99)
Glucose-Capillary: 171 mg/dL — ABNORMAL HIGH (ref 70–99)

## 2020-03-16 MED ORDER — CYCLOBENZAPRINE HCL 10 MG PO TABS
5.0000 mg | ORAL_TABLET | Freq: Three times a day (TID) | ORAL | Status: AC | PRN
  Administered 2020-03-16 – 2020-03-18 (×3): 5 mg via ORAL
  Filled 2020-03-16 (×3): qty 1

## 2020-03-16 NOTE — Plan of Care (Signed)
  Problem: Education: Goal: Knowledge of General Education information will improve Description: Including pain rating scale, medication(s)/side effects and non-pharmacologic comfort measures Outcome: Progressing   Problem: Health Behavior/Discharge Planning: Goal: Ability to manage health-related needs will improve Outcome: Progressing   Problem: Clinical Measurements: Goal: Ability to maintain clinical measurements within normal limits will improve Outcome: Progressing Goal: Will remain free from infection Outcome: Progressing Goal: Diagnostic test results will improve Outcome: Progressing Goal: Respiratory complications will improve Outcome: Progressing Goal: Cardiovascular complication will be avoided Outcome: Progressing   Problem: Activity: Goal: Risk for activity intolerance will decrease Outcome: Progressing   Problem: Nutrition: Goal: Adequate nutrition will be maintained Outcome: Progressing   Problem: Coping: Goal: Level of anxiety will decrease Outcome: Progressing   Problem: Elimination: Goal: Will not experience complications related to bowel motility Outcome: Progressing Goal: Will not experience complications related to urinary retention Outcome: Progressing   Problem: Pain Managment: Goal: General experience of comfort will improve Outcome: Progressing   Problem: Safety: Goal: Ability to remain free from injury will improve Outcome: Progressing   Problem: Skin Integrity: Goal: Risk for impaired skin integrity will decrease Outcome: Progressing   Problem: Clinical Measurements: Goal: Signs and symptoms of graft occlusion will improve Outcome: Progressing   Problem: Skin Integrity: Goal: Demonstration of wound healing without infection will improve Outcome: Progressing

## 2020-03-16 NOTE — Progress Notes (Signed)
[facility]  Subjective  -no complaints.    Objective 117/79 69 97.8 F (36.6 C) (Oral) 15 92%  Intake/Output Summary (Last 24 hours) at 03/16/2020 0930 Last data filed at 03/16/2020 2841 Gross per 24 hour  Intake --  Output 900 ml  Net -900 ml    Right lower extremity fasciotomy incisions are clean dry intact and there is some desquamation on the medial incision along the staple line Right peroneal signal brisk Left PT DP signal brisk  Laboratory Lab Results: Recent Labs    03/15/20 0246 03/16/20 0244  WBC 7.7 8.3  HGB 9.1* 9.0*  HCT 28.1* 28.5*  PLT 148* 160   BMET Recent Labs    03/15/20 0246  NA 137  K 4.0  CL 104  CO2 23  GLUCOSE 142*  BUN 10  CREATININE 0.92  CALCIUM 9.0    COAG Lab Results  Component Value Date   INR 1.3 (H) 03/06/2020   INR 1.1 01/15/2020   INR 1.0 01/14/2020   No results found for: PTT  Assessment/Planning:  74 year old male status post thrombectomy of bilateral limbs of EVAR with 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  Further work-up has now revealed that the thrombus sent to pathology is likely malignancy.  Apparently was diagnosed with lung cancer at the [place] and did not seek treatment last year.  CT chest shows large left upper lobe lung mass with likely invasion into the atrial appendage as well as the pulmonary vein and left atrium extending to the mitral valve.  Likely not a candidate for surgery per CT which seems appropriate.  Serotonin study negative for HIT. Discussed with pharmacy yesterday ok to stop angiomax now that HIT work-up negative.  On treatment dose lovenox now.  Marty Heck 03/16/2020 9:30 AM --

## 2020-03-16 NOTE — Progress Notes (Signed)
PROGRESS NOTE        PATIENT DETAILS Name: [name] Age: 74 y.o. Sex: male Date of Birth: 09-27-1945 Admit Date: 03/06/2020 Admitting Physician Garner Nash, DO EQA:[id number]  Brief Narrative: Patient is a 74 y.o. male with hx AAA s/p repair in 12/2019 with endovascular stent graft s/p left iliac thrombectomy and left external iliac stent, PVD, HTN, DM2, CAD,  atrial fibrillation presented with right ischemic leg-further evaluation revealed occluded right/left limb of the aorto biiliac endograft-underwent emergent thrombectomy and right lower extremity 4 compartment fasciotomy-further evaluation with a intraoperative TEE showed a mobile left atrial thrombus.  Subsequent CT imaging study showed a large left lung mass-eroding into the mediastinum into the pulmonary vein and into the left atrium.  Suspicion for tumor embolism causing ischemic leg-see below for further details.  Significant events: 7/15>> admit for right ischemic leg-underwent emergent thrombectomy/fasciotomy of right leg  Significant studies: 7/15>> CT angio lower extremities: Right limb of the aortic stent graft occluded 7/21>> carotid Doppler: None hemodynamically significant stenosis bilaterally 7/21>> CT coronary: Large left upper lobe mass-encasing the left hilum-occluding left upper lobe/lingula bronchi with signs of postobstructive pneumonitis in the left upper lobe-mass invades the left upper lobe pulmonary vein with tumor thrombus in the left atrium extending to the mitral valve  Antimicrobial therapy: None  Microbiology data: None  Pathology: 7/15>>Thrombus-Right iliac thrombectomy:pending  Procedures : 7/20>> intraoperative TEE: Large lobulated mobile thrombus in the left atrium 7/20>> closure of right leg fasciotomy 7/15>> intraoperative TEE: Large LA thrombus 7/15>> Thrombectomy of right limb, left limb of aorto biiliac endograft, right lower extremity 4 compartment  fasciotomy  Consults: PCCM, VVS, CTVS  DVT Prophylaxis : SCDs Start: 03/06/20 1559 IV bivalirudin.  Subjective: Lying comfortably in bed-no chest pain or shortness of breath.   Assessment/Plan: Acute bilateral occlusion of aortoiliac grafts with right ischemic leg-s/p thrombectomy/4 compartment fasciotomy likely secondary to tumor embolism from large atrial thrombus/tumor due to lung malignancy eroding into the mediastinum: Vascular surgery following-s/p thrombectomy.  Discussed with pathology (Dr. 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  See discussion below.  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  Left lung mass: Seen on CT imaging studies-upon discussion with patient on 7/23-he confirmed that he was told he has "lung cancer" sometime either in 2020 or 2019-[facility]-but he decided not to proceed with any treatment.  He apparently had a bronchoscopy with biopsy-awaiting records from [facility].  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  Appreciate oncology input.  PAF: Had a brief run of atrial flutter yesterday-back in sinus rhythm-continue Cardizem-now on full dose Lovenox.    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  Normocytic anemia: Likely secondary to critical illness-no indication of GI loss.  Follow for now.  COPD: Not in exacerbation-continue bronchodilators.  HTN: BP stable  CAD: No anginal symptoms-continue Plavix/statin  HLD: Continue statin  DM-2: CBG stable-continue SSI   Recent Labs    03/15/20 2106 03/16/20 0600 03/16/20 0724  GLUCAP 144* 140* 154*   Pressure injury: Appreciate wound care input-have initiated 2 days of Diflucan.  Pressure Injury 03/12/20 Buttocks Mid Stage 3 -  Full  thickness tissue loss. Subcutaneous fat may be visible but bone,  tendon or muscle are NOT exposed. (Active)  03/12/20 2000  Location: Buttocks  Location Orientation: Mid  Staging: Stage 3 -  Full thickness tissue loss. Subcutaneous fat may be visible but bone, tendon or muscle are NOT exposed.  Wound Description (Comments):   Present on Admission:     Diet: Diet Order            Diet Heart Room service appropriate? Yes; Fluid consistency: Thin  Diet effective now                  Code Status: Full code   Family Communication: Spoke to spouse on 7/23  Disposition Plan: Status is: Inpatient  Remains inpatient appropriate because:Inpatient level of care appropriate due to severity of illness   Dispo: The patient is from: Home              Anticipated d/c is to: TBD              Anticipated d/c date is: > 3 days              Patient currently is not medically stable to d/c.  Barriers to Discharge: Ischemic right leg-s/p emergent thrombectomy-found to have a large left atrial thrombus-awaiting records from [facility] placement   Antimicrobial agents: Anti-infectives (From admission, onward)   Start     Dose/Rate Route Frequency Ordered Stop   03/13/20 1000  fluconazole (DIFLUCAN) tablet 100 mg        100 mg Oral Daily 03/13/20 0858 03/14/20 0910   03/07/20 0200  ceFAZolin (ANCEF) IVPB 2g/100 mL premix        2 g 200 mL/hr over 30 Minutes Intravenous Every 8 hours 03/06/20 2112 03/07/20 1052   03/06/20 1958  ceFAZolin (ANCEF) 2-4 GM/100ML-% IVPB       Note to Pharmacy: Alba Cory   : cabinet override      03/06/20 1958 03/07/20 0759       Time spent: 25 minutes-Greater than 50% of this time was spent in counseling, explanation of diagnosis, planning of further management, and coordination of care.  MEDICATIONS: Scheduled Meds:  atorvastatin  20 mg Oral Daily   clopidogrel  75 mg Oral Daily   diltiazem  90 mg Oral Q6H   docusate sodium  100 mg  Oral Daily   enoxaparin (LOVENOX) injection  1 mg/kg Subcutaneous Q12H   Gerhardt's butt cream   Topical TID   insulin aspart  0-15 Units Subcutaneous TID WC   pantoprazole (PROTONIX) IV  40 mg Intravenous Daily   umeclidinium bromide  1 puff Inhalation Daily   Continuous Infusions:  sodium chloride Stopped (03/08/20 1008)   sodium chloride     PRN Meds:.sodium chloride, acetaminophen **OR** acetaminophen, albuterol, alum & mag hydroxide-simeth, guaiFENesin-dextromethorphan, metoprolol tartrate, morphine injection, ondansetron, oxyCODONE, phenol, polyethylene glycol, white petrolatum   PHYSICAL EXAM: Vital signs: Vitals:   03/15/20 1913 03/15/20 2335 03/16/20 0358 03/16/20 0713  BP: 118/67 128/80 126/68 117/79  Pulse:  67 91 69  Resp:  22 23 15   Temp: 98 F (36.7 C) 97.6 F (36.4 C) 97.8 F (36.6 C) 97.8 F (36.6 C)  TempSrc: Oral Oral Oral Oral  SpO2:  99% 100% 92%  Weight:   88.3 kg   Height:       Filed Weights   03/14/20 0434 03/15/20 0258 03/16/20 0358  Weight: 88.7 kg 87.9 kg 88.3 kg   Body mass index is 24.33 kg/m.   Gen Exam:Alert awake-not in  any distress HEENT:atraumatic, normocephalic Chest: B/L clear to auscultation anteriorly CVS:S1S2 regular Abdomen:soft non tender, non distended Extremities:no edema Neurology: Non focal Skin: no rash  I have personally reviewed following labs and imaging studies  LABORATORY DATA: CBC: Recent Labs  Lab 03/12/20 0251 03/13/20 0304 03/14/20 0830 03/15/20 0246 03/16/20 0244  WBC 9.9 10.6* 9.2 7.7 8.3  HGB 8.6* 8.6* 8.4* 9.1* 9.0*  HCT 26.7* 27.3* 26.7* 28.1* 28.5*  MCV 87.3 88.3 87.5 89.2 87.2  PLT 125* 139* 147* 148* 427    Basic Metabolic Panel: Recent Labs  Lab 03/10/20 0234 03/11/20 0318 03/12/20 0251 03/13/20 0304 03/15/20 0246  NA 134* 135 136 135 137  K 4.3 3.7 4.0 3.6 4.0  CL 100 102 103 102 104  CO2 27 24 24 23 23   GLUCOSE 161* 158* 155* 134* 142*  BUN 12 11 12 10 10   CREATININE  0.95 0.93 1.06 0.99 0.92  CALCIUM 8.8* 8.8* 9.0 8.8* 9.0  MG 1.9 2.0 1.8  --   --   PHOS 2.7 2.7 2.9 2.5  --     GFR: Estimated Creatinine Clearance: 85.5 mL/min (by C-G formula based on SCr of 0.92 mg/dL).  Liver Function Tests: Recent Labs  Lab 03/11/20 0318  AST 38  ALT 22  ALKPHOS 50  BILITOT 1.8*  PROT 5.9*  ALBUMIN 2.9*   No results for input(s): LIPASE, AMYLASE in the last 168 hours. No results for input(s): AMMONIA in the last 168 hours.  Coagulation Profile: No results for input(s): INR, PROTIME in the last 168 hours.  Cardiac Enzymes: No results for input(s): CKTOTAL, CKMB, CKMBINDEX, TROPONINI in the last 168 hours.  BNP (last 3 results) No results for input(s): PROBNP in the last 8760 hours.  Lipid Profile: No results for input(s): CHOL, HDL, LDLCALC, TRIG, CHOLHDL, LDLDIRECT in the last 72 hours.  Thyroid Function Tests: No results for input(s): TSH, T4TOTAL, FREET4, T3FREE, THYROIDAB in the last 72 hours.  Anemia Panel: No results for input(s): VITAMINB12, FOLATE, FERRITIN, TIBC, IRON, RETICCTPCT in the last 72 hours.  Urine analysis:    Component Value Date/Time   COLORURINE YELLOW 07/02/2016 2112   APPEARANCEUR CLEAR 07/02/2016 2112   LABSPEC 1.006 07/02/2016 2112   PHURINE 5.5 07/02/2016 2112   GLUCOSEU NEGATIVE 07/02/2016 2112   HGBUR LARGE (A) 07/02/2016 2112   BILIRUBINUR NEGATIVE 07/02/2016 2112   KETONESUR NEGATIVE 07/02/2016 2112   PROTEINUR NEGATIVE 07/02/2016 2112   NITRITE NEGATIVE 07/02/2016 2112   LEUKOCYTESUR TRACE (A) 07/02/2016 2112    Sepsis Labs: Lactic Acid, Venous    Component Value Date/Time   LATICACIDVEN 2.4 ([place]) 03/06/2020 2158    MICROBIOLOGY: Recent Results (from the past 240 hour(s))  SARS Coronavirus 2 by RT PCR (hospital order, performed in [place] hospital lab) Nasopharyngeal Nasopharyngeal Swab     Status: None   Collection Time: 03/06/20  2:28 PM   Specimen: Nasopharyngeal Swab  Result Value Ref  Range Status   SARS Coronavirus 2 NEGATIVE NEGATIVE Final    Comment: (NOTE) SARS-CoV-2 target nucleic acids are NOT DETECTED.  The SARS-CoV-2 RNA is generally detectable in upper and lower respiratory specimens during the acute phase of infection. The lowest concentration of SARS-CoV-2 viral copies this assay can detect is 250 copies / mL. A negative result does not preclude SARS-CoV-2 infection and should not be used as the sole basis for treatment or other patient management decisions.  A negative result may occur with improper specimen collection / handling, submission of specimen  other than nasopharyngeal swab, presence of viral mutation(s) within the areas targeted by this assay, and inadequate number of viral copies (<250 copies / mL). A negative result must be combined with clinical observations, patient history, and epidemiological information.  Fact Sheet for Patients:   StrictlyIdeas.no  Fact Sheet for Healthcare Providers: BankingDealers.co.za  This test is not yet approved or  cleared by the [place] FDA and has been authorized for detection and/or diagnosis of SARS-CoV-2 by FDA under an Emergency Use Authorization (EUA).  This EUA will remain in effect (meaning this test can be used) for the duration of the COVID-19 declaration under Section 564(b)(1) of the Act, 21 U.S.C. section 360bbb-3(b)(1), unless the authorization is terminated or revoked sooner.  Performed at [facility], [place] [address], [place], [place] [address]   MRSA PCR Screening     Status: None   Collection Time: 03/06/20  9:38 PM   Specimen: Nasal Mucosa; Nasopharyngeal  Result Value Ref Range Status   MRSA by PCR NEGATIVE NEGATIVE Final    Comment:        The GeneXpert MRSA Assay (FDA approved for NASAL specimens only), is one component of a comprehensive MRSA colonization surveillance program. It is not intended to diagnose  MRSA infection nor to guide or monitor treatment for MRSA infections. Performed at [facility], [place] [address], [place], [place] [address]     RADIOLOGY STUDIES/RESULTS: No results found.   LOS: 10 days   Oren Binet, MD  [facility]    To contact the attending provider between 7A-7P or the covering provider during after hours 7P-7A, please log into the web site [email or web address] and access using universal [place] password for that web site. If you do not have the password, please call the hospital operator.  03/16/2020, 10:54 AM

## 2020-03-16 NOTE — Progress Notes (Signed)
Pt currently in rate controlled afib with rate peaking in low 100s. Pt previously in normal sinus, but does have a history of afib and is on cardizem. Morning dose of cardizem given, will continue to monitor.

## 2020-03-17 ENCOUNTER — Encounter (HOSPITAL_COMMUNITY)
Admission: EM | Disposition: A | Discharge status: Skilled Nursing Facility | Payer: Self-pay | Source: Home / Self Care | Attending: Internal Medicine

## 2020-03-17 DIAGNOSIS — I998 Other disorder of circulatory system: Secondary | ICD-10-CM | POA: Diagnosis not present

## 2020-03-17 DIAGNOSIS — I480 Paroxysmal atrial fibrillation: Secondary | ICD-10-CM | POA: Diagnosis not present

## 2020-03-17 LAB — SURGICAL PATHOLOGY

## 2020-03-17 LAB — CBC
HCT: 30.2 % — ABNORMAL LOW (ref 39.0–52.0)
Hemoglobin: 9.4 g/dL — ABNORMAL LOW (ref 13.0–17.0)
MCH: 27.1 pg (ref 26.0–34.0)
MCHC: 31.1 g/dL (ref 30.0–36.0)
MCV: 87 fL (ref 80.0–100.0)
Platelets: 149 10*3/uL — ABNORMAL LOW (ref 150–400)
RBC: 3.47 MIL/uL — ABNORMAL LOW (ref 4.22–5.81)
RDW: 13.6 % (ref 11.5–15.5)
WBC: 9.3 10*3/uL (ref 4.0–10.5)
nRBC: 0 % (ref 0.0–0.2)

## 2020-03-17 LAB — GLUCOSE, CAPILLARY
Glucose-Capillary: 149 mg/dL — ABNORMAL HIGH (ref 70–99)
Glucose-Capillary: 229 mg/dL — ABNORMAL HIGH (ref 70–99)
Glucose-Capillary: 125 mg/dL — ABNORMAL HIGH (ref 70–99)
Glucose-Capillary: 128 mg/dL — ABNORMAL HIGH (ref 70–99)

## 2020-03-17 LAB — BASIC METABOLIC PANEL
Anion gap: 9 (ref 5–15)
BUN: 11 mg/dL (ref 8–23)
CO2: 24 mmol/L (ref 22–32)
Calcium: 9.3 mg/dL (ref 8.9–10.3)
Chloride: 102 mmol/L (ref 98–111)
Creatinine, Ser: 1.08 mg/dL (ref 0.61–1.24)
GFR calc Af Amer: >60 mL/min (ref >60)
GFR calc non Af Amer: >60 mL/min (ref >60)
Glucose, Bld: 133 mg/dL — ABNORMAL HIGH (ref 70–99)
Potassium: 3.9 mmol/L (ref 3.5–5.1)
Sodium: 135 mmol/L (ref 135–145)

## 2020-03-17 SURGERY — LEFT HEART CATH AND CORONARY ANGIOGRAPHY
Anesthesia: LOCAL

## 2020-03-17 NOTE — TOC Initial Note (Addendum)
Transition of Care [facility]) - Initial/Assessment Note    Patient Details  Name: [name] MRN: [id number] Date of Birth: 1946-02-07  Transition [facility]) CM/SW Contact:    Jacquelynn Cree Phone Number: 03/17/2020, 3:02 PM  Clinical Narrative:                 CSW met with patient and patient's wife Antionette bedside regarding PT recommendation for short-term SNF at discharge. Patient and wife are in agreement with SNF at discharge with preference for a location close to the hospital. PTA, patient was ambulating at home with the use of a walker and cane. Patent was independent with ADLs.  CSW explained insurance authrization process to family, insurance currently pending ref #[id number]. TOC team will continue to follow.  Expected Discharge Plan: Skilled Nursing Facility Barriers to Discharge: Continued Medical Work up, Ship broker   Patient Goals and CMS Choice   CMS Medicare.gov Compare Post Acute Care list provided to:: Patient Choice offered to / list presented to : Patient  Expected Discharge Plan and Services Expected Discharge Plan: [place] In-house Referral: Clinical Social Work     Living arrangements for the past 2 months: [place]                                      Prior Living Arrangements/Services Living arrangements for the past 2 months: Single Family Home Lives with:: Spouse Patient language and need for interpreter reviewed:: Yes Do you feel safe going back to the place where you live?: Yes      Need for Family Participation in Patient Care: No (Comment) Care giver support system in place?: Yes (comment)   Criminal Activity/Legal Involvement Pertinent to Current Situation/Hospitalization: No - Comment as needed  Activities of Daily Living Home Assistive Devices/Equipment: Cane (specify quad or straight), Walker (specify type) (straight, front wheel) ADL Screening (condition at time of  admission) Patient's cognitive ability adequate to safely complete daily activities?: No Is the patient deaf or have difficulty hearing?: No Does the patient have difficulty seeing, even when wearing glasses/contacts?: No Does the patient have difficulty concentrating, remembering, or making decisions?: No Patient able to express need for assistance with ADLs?: Yes Does the patient have difficulty dressing or bathing?: No Independently performs ADLs?: Yes (appropriate for developmental age) Does the patient have difficulty walking or climbing stairs?: No Weakness of Legs: Both Weakness of Arms/Hands: None  Permission Sought/Granted Permission sought to share information with : Facility Sport and exercise psychologist, Family Supports Permission granted to share information with : Yes, Verbal Permission Granted  Share Information with NAME: Antionette Twombly  Permission granted to share info w AGENCY: SNFs  Permission granted to share info w Relationship: Spouse  Permission granted to share info w Contact Information: [phone]  Emotional Assessment   Attitude/Demeanor/Rapport: Unable to Assess Affect (typically observed): Unable to Assess Orientation: : Oriented to Self, Oriented to Place, Oriented to  Time, Oriented to Situation Alcohol / Substance Use: Not Applicable Psych Involvement: No (comment)  Admission diagnosis:  Femoral-tibial bypass graft occlusion, right [facility]) [Y09.983J] Patient Active Problem List   Diagnosis Date Noted  . Pressure injury of skin 03/13/2020  . AF (paroxysmal atrial fibrillation) ([place]) 03/10/2020  . Left atrial thrombus 03/10/2020  . Vascular occlusion   . Femoral-tibial bypass graft occlusion, right ([place]) 03/06/2020  . Aortic occlusion ([place]) 03/06/2020  . AAA (abdominal aortic aneurysm)  without rupture ([place]) 01/15/2020  . Tobacco abuse 06/02/2017  . CAD (coronary artery disease) 07/12/2016  . Bradycardia 07/06/2016  . Chest pain with high risk for  cardiac etiology 07/03/2016  . Hypokalemia 07/03/2016  . DM (diabetes mellitus) ([place]) 07/03/2016  . Essential hypertension 07/03/2016  . Gross hematuria 07/03/2016   PCP:  Mark:   [place], [place] [address] Phone: [phone] Fax: [phone]     Social Determinants of Health (SDOH) Interventions    Readmission Risk Interventions No flowsheet data found.

## 2020-03-17 NOTE — Progress Notes (Signed)
PROGRESS NOTE        PATIENT DETAILS Name: [name] Age: 74 y.o. Sex: male Date of Birth: [birth year] Admit Date: 03/06/2020 Admitting Physician Garner Nash, DO QQP:[facility]PPJKD[facility]  Brief Narrative: Patient is a 74 y.o. male with hx AAA s/p repair in 12/2019 with endovascular stent graft s/p left iliac thrombectomy and left external iliac stent, PVD, HTN, DM2, CAD,  atrial fibrillation presented with right ischemic leg-further evaluation revealed occluded right/left limb of the aorto biiliac endograft-underwent emergent thrombectomy and right lower extremity 4 compartment fasciotomy-further evaluation with a intraoperative TEE showed a mobile left atrial thrombus.  Subsequent CT imaging study showed a large left lung mass-eroding into the mediastinum into the pulmonary vein and into the left atrium.  Suspicion for tumor embolism causing ischemic leg-see below for further details.  Significant events: 7/15>> admit for right ischemic leg-underwent emergent thrombectomy/fasciotomy of right leg  Significant studies: 7/15>> CT angio lower extremities: Right limb of the aortic stent graft occluded 7/21>> carotid Doppler: None hemodynamically significant stenosis bilaterally 7/21>> CT coronary: Large left upper lobe mass-encasing the left hilum-occluding left upper lobe/lingula bronchi with signs of postobstructive pneumonitis in the left upper lobe-mass invades the left upper lobe pulmonary vein with tumor thrombus in the left atrium extending to the mitral valve  Antimicrobial therapy: None  Microbiology data: None  Pathology: 7/15>>Thrombus-Right iliac thrombectomy:pending  Procedures : 7/20>> intraoperative TEE: Large lobulated mobile thrombus in the left atrium 7/20>> closure of right leg fasciotomy 7/15>> intraoperative TEE: Large LA thrombus 7/15>> Thrombectomy of right limb, left limb of aorto biiliac endograft, right lower extremity 4 compartment  fasciotomy  Consults: PCCM, VVS, CTVS, Hematology  DVT Prophylaxis : SCDs Start: 03/06/20 1559 SQ Lovenox  Subjective: Working with PT when I saw him earlier this morning-he walked the hallway-very briefly.   Assessment/Plan: Acute bilateral occlusion of aortoiliac grafts with right ischemic leg-s/p thrombectomy/4 compartment fasciotomy likely secondary to tumor embolism from large atrial thrombus/tumor due to lung malignancy eroding into the mediastinum: Vascular surgery following-s/p thrombectomy.  Discussed with pathology (Dr. 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  See discussion below.  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  CT surgery initially contemplated sternotomy and clot removal-but given diagnosis of malignancy-he is now deemed not operative candidate.  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  He apparently had a bronchoscopy with biopsy-awaiting records from [facility].  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  Appreciate oncology input.  PAF: Had a run of A. fib with RVR earlier this morning-back in sinus rhythm-remains on full dose Lovenox.  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  Normocytic anemia: Likely secondary to critical illness-no indication of GI loss.  Follow for now.  COPD: Not in exacerbation-continue bronchodilators.  HTN: BP stable  CAD-with history of PCI to RCA in 2006: No anginal symptoms-continue Plavix/statin  HLD: Continue statin  DM-2: CBG stable-continue SSI   Recent Labs    03/16/20 1623  03/16/20 2129 03/17/20 0555  GLUCAP 101* 171* 149*   Pressure  injury: Appreciate wound care input-have initiated 2 days of Diflucan.  Pressure Injury 03/12/20 Buttocks Mid Stage 3 -  Full thickness tissue loss. Subcutaneous fat may be visible but bone, tendon or muscle are NOT exposed. (Active)  03/12/20 2000  Location: Buttocks  Location Orientation: Mid  Staging: Stage 3 -  Full thickness tissue loss. Subcutaneous fat may be visible but bone, tendon or muscle are NOT exposed.  Wound Description (Comments):   Present on Admission:     Diet: Diet Order            Diet Heart Room service appropriate? Yes; Fluid consistency: Thin  Diet effective now                  Code Status: Full code   Family Communication: Left a voicemail for spouse  Disposition Plan: Status is: Inpatient  Remains inpatient appropriate because:Inpatient level of care appropriate due to severity of illness   Dispo: The patient is from: Home              Anticipated d/c is to: TBD              Anticipated d/c date is: > 1 days              Patient currently is medically stable to d/c.  Barriers to Discharge: Ischemic right leg-s/p emergent thrombectomy-found to have a large left atrial thrombus-awaiting records from [place] [facility] placement   Antimicrobial agents: Anti-infectives (From admission, onward)   Start     Dose/Rate Route Frequency Ordered Stop   03/13/20 1000  fluconazole (DIFLUCAN) tablet 100 mg        100 mg Oral Daily 03/13/20 0858 03/14/20 0910   03/07/20 0200  ceFAZolin (ANCEF) IVPB 2g/100 mL premix        2 g 200 mL/hr over 30 Minutes Intravenous Every 8 hours 03/06/20 2112 03/07/20 1052   03/06/20 1958  ceFAZolin (ANCEF) 2-4 GM/100ML-% IVPB       Note to Pharmacy: Alba Cory   : cabinet override      03/06/20 1958 03/07/20 0759       Time spent: 15 minutes-Greater than 50% of this time was spent in counseling, explanation of diagnosis, planning of further  management, and coordination of care.  MEDICATIONS: Scheduled Meds: . atorvastatin  20 mg Oral Daily  . clopidogrel  75 mg Oral Daily  . diltiazem  90 mg Oral Q6H  . docusate sodium  100 mg Oral Daily  . enoxaparin (LOVENOX) injection  1 mg/kg Subcutaneous Q12H  . Gerhardt's butt cream   Topical TID  . insulin aspart  0-15 Units Subcutaneous TID WC  . pantoprazole (PROTONIX) IV  40 mg Intravenous Daily  . umeclidinium bromide  1 puff Inhalation Daily   Continuous Infusions: . sodium chloride Stopped (03/08/20 1008)  . sodium chloride     PRN Meds:.sodium chloride, acetaminophen **OR** acetaminophen, albuterol, alum & mag hydroxide-simeth, cyclobenzaprine, guaiFENesin-dextromethorphan, metoprolol tartrate, morphine injection, ondansetron, oxyCODONE, phenol, polyethylene glycol, white petrolatum   PHYSICAL EXAM: Vital signs: Vitals:   03/17/20 0500 03/17/20 0700 03/17/20 0714 03/17/20 0716  BP:   105/73   Pulse:      Resp:  20 20 18   Temp:   97.6 F (36.4 C)   TempSrc:   Oral   SpO2:      Weight: 89.7 kg     Height:       Filed Weights   03/15/20 0258 03/16/20 0358 03/17/20 0500  Weight: 87.9 kg 88.3 kg 89.7 kg   Body mass index is 24.72 kg/m.   Gen Exam:Alert awake-not in any distress HEENT:atraumatic, normocephalic Chest: B/L clear to auscultation anteriorly CVS:S1S2 regular Abdomen:soft non tender, non distended Extremities:no edema Neurology: Non focal Skin: no rash  I have personally reviewed following labs and imaging studies  LABORATORY DATA: CBC: Recent Labs  Lab 03/13/20 0304 03/14/20 0830 03/15/20 0246 03/16/20 0244 03/17/20 0152  WBC 10.6* 9.2 7.7 8.3 9.3  HGB 8.6* 8.4* 9.1* 9.0* 9.4*  HCT 27.3* 26.7* 28.1* 28.5* 30.2*  MCV 88.3 87.5 89.2 87.2 87.0  PLT 139* 147* 148* 160 149*    Basic Metabolic Panel: Recent Labs  Lab 03/11/20 0318 03/12/20 0251 03/13/20 0304 03/15/20 0246 03/17/20 0152  NA 135 136 135 137 135  K 3.7 4.0 3.6 4.0  3.9  CL 102 103 102 104 102  CO2 24 24 23 23 24   GLUCOSE 158* 155* 134* 142* 133*  BUN 11 12 10 10 11   CREATININE 0.93 1.06 0.99 0.92 1.08  CALCIUM 8.8* 9.0 8.8* 9.0 9.3  MG 2.0 1.8  --   --   --   PHOS 2.7 2.9 2.5  --   --     GFR: Estimated Creatinine Clearance: 72.8 mL/min (by C-G formula based on SCr of 1.08 mg/dL).  Liver Function Tests: Recent Labs  Lab 03/11/20 0318  AST 38  ALT 22  ALKPHOS 50  BILITOT 1.8*  PROT 5.9*  ALBUMIN 2.9*   No results for input(s): LIPASE, AMYLASE in the last 168 hours. No results for input(s): AMMONIA in the last 168 hours.  Coagulation Profile: No results for input(s): INR, PROTIME in the last 168 hours.  Cardiac Enzymes: No results for input(s): CKTOTAL, CKMB, CKMBINDEX, TROPONINI in the last 168 hours.  BNP (last 3 results) No results for input(s): PROBNP in the last 8760 hours.  Lipid Profile: No results for input(s): CHOL, HDL, LDLCALC, TRIG, CHOLHDL, LDLDIRECT in the last 72 hours.  Thyroid Function Tests: No results for input(s): TSH, T4TOTAL, FREET4, T3FREE, THYROIDAB in the last 72 hours.  Anemia Panel: No results for input(s): VITAMINB12, FOLATE, FERRITIN, TIBC, IRON, RETICCTPCT in the last 72 hours.  Urine analysis:    Component Value Date/Time   COLORURINE YELLOW 07/02/2016 2112   APPEARANCEUR CLEAR 07/02/2016 2112   LABSPEC 1.006 07/02/2016 2112   PHURINE 5.5 07/02/2016 2112   GLUCOSEU NEGATIVE 07/02/2016 2112   HGBUR LARGE (A) 07/02/2016 2112   BILIRUBINUR NEGATIVE 07/02/2016 2112   KETONESUR NEGATIVE 07/02/2016 2112   PROTEINUR NEGATIVE 07/02/2016 2112   NITRITE NEGATIVE 07/02/2016 2112   LEUKOCYTESUR TRACE (A) 07/02/2016 2112    Sepsis Labs: Lactic Acid, Venous    Component Value Date/Time   LATICACIDVEN 2.4 ([place]) 03/06/2020 2158    MICROBIOLOGY: No results found for this or any previous visit (from the past 240 hour(s)).  RADIOLOGY STUDIES/RESULTS: No results found.   LOS: 11 days   Oren Binet, MD  [facility]    To contact the attending provider between 7A-7P or the covering provider during after hours 7P-7A, please log into the web site [email or web address] and access using universal [place] password for that web site. If you do not have the password, please call the hospital operator.  03/17/2020, 10:59 AM

## 2020-03-17 NOTE — Progress Notes (Signed)
  Progress Note    03/17/2020 7:22 AM 6 Days Post-Op  Subjective:  Says his legs feel good when people aren't dropping things on them.  Pt getting ready to eat.  Afebrile 99% RA  Vitals:   03/17/20 0714 03/17/20 0716  BP: 105/73   Pulse:    Resp: 20 18  Temp: 97.6 F (36.4 C)   SpO2:      Physical Exam: Cardiac:  tachy Lungs:  Non labored Extremities:  Monophasic DP doppler signals bilaterally as well as right PT and brisk left PT   CBC    Component Value Date/Time   WBC 9.3 03/17/2020 0152   RBC 3.47 (L) 03/17/2020 0152   HGB 9.4 (L) 03/17/2020 0152   HCT 30.2 (L) 03/17/2020 0152   PLT 149 (L) 03/17/2020 0152   MCV 87.0 03/17/2020 0152   MCH 27.1 03/17/2020 0152   MCHC 31.1 03/17/2020 0152   RDW 13.6 03/17/2020 0152   LYMPHSABS 2.1 03/06/2020 1417   MONOABS 0.6 03/06/2020 1417   EOSABS 0.1 03/06/2020 1417   BASOSABS 0.0 03/06/2020 1417    BMET    Component Value Date/Time   NA 135 03/17/2020 0152   K 3.9 03/17/2020 0152   CL 102 03/17/2020 0152   CO2 24 03/17/2020 0152   GLUCOSE 133 (H) 03/17/2020 0152   BUN 11 03/17/2020 0152   CREATININE 1.08 03/17/2020 0152   CALCIUM 9.3 03/17/2020 0152   GFRNONAA >60 03/17/2020 0152   GFRAA >60 03/17/2020 0152    INR    Component Value Date/Time   INR 1.3 (H) 03/06/2020 1417     Intake/Output Summary (Last 24 hours) at 03/17/2020 3202 Last data filed at 03/17/2020 0500 Gross per 24 hour  Intake --  Output 1825 ml  Net -1825 ml     Assessment:  74 y.o. male is s/p:  thrombectomy of bilateral limbs of EVAR with suspected atrial thrombus    Plan: -pt with brisk doppler signals bilateral feet -pt with LUL mass with invasion into the atrial appendage as well as the pulmonary vein and left atrium extending to the mitral valve.  Per CT surgery, not a surgical candidate.   -DVT prophylaxis:  HIT work up negative and pt now on Lovenox.    Leontine Locket, PA-C [facility] [phone] 03/17/2020 7:22 AM

## 2020-03-17 NOTE — Progress Notes (Signed)
Physical Therapy Treatment Patient Details Name: [name] MRN: [id number] DOB: 13-Oct-1945 Today's Date: 03/17/2020    History of Present Illness Pt is a 74 y.o. male with recent AAA repair 12/2019, now admitted 03/06/20 with R hip pain and leg parasthesis; s/p emergent thrombectomy of BLEs, aortobiiliac endograft, fasciotomy 7/15. S/p closure of RLE fasciotomies 7/20; intraoperative TEE Showed large lobulated mobile thrombus in L atrium. Cardiothoracic sx contemplating sternotomy, cardiopulmonry bypass and resection of LA thrombus; work-up in progress. Other PMH includes HTN, PVD, CAD, DM, asthma.    PT Comments    Pt pleasant and happy to have received premedication. Pt able to move RLE without assist today and progress to walking in hall with limited distance due to Afib and SOB. When in NSR pt with improved distance and activity tolerance with cues for RW use and safety. Pt educated for RLE ROM and HEP and encouraged to continue movement throughout the day. Also requested wife bring shoes for pt.   Hr 78-144    Follow Up Recommendations  SNF;Supervision/Assistance - 24 hour     Equipment Recommendations  Rolling walker with 5" wheels    Recommendations for Other Services       Precautions / Restrictions Precautions Precautions: Fall Precaution Comments: R LE incisions; faciotomies with pain Restrictions Weight Bearing Restrictions: Yes RLE Weight Bearing: Weight bearing as tolerated    Mobility  Bed Mobility Overal bed mobility: Needs Assistance       Supine to sit: Min assist     General bed mobility comments: assist to move RLE off bed and elevate trunk  Transfers Overall transfer level: Needs assistance   Transfers: Sit to/from Stand;Stand Pivot Transfers Sit to Stand: Mod assist;+2 physical assistance         General transfer comment: mod +2 to stand from bed with progressive improvement over 3 trials to min +2  Ambulation/Gait Ambulation/Gait  assistance: Mod assist;+2 physical assistance Gait Distance (Feet): 36 Feet Assistive device: Rolling walker (2 wheeled) Gait Pattern/deviations: Step-to pattern;Decreased stride length;Trunk flexed   Gait velocity interpretation: 1.31 - 2.62 ft/sec, indicative of limited community ambulator General Gait Details: cues for safety with pt able to walk 24', 12', 17' with seated rest and close chair follow. Initial 2 trials pt with Afib up to 144 limiting gait and on 2nd trial pt with episode of knee buckling with assist to maintain balance. Last trial HR maintained NSR with 78-101 with pt with significantly improved gait tolerance   Stairs             Wheelchair Mobility    Modified Rankin (Stroke Patients Only)       Balance Overall balance assessment: Needs assistance Sitting-balance support: Feet supported;No upper extremity supported Sitting balance-Leahy Scale: Fair Sitting balance - Comments: able to sit EOB without UE support   Standing balance support: Bilateral upper extremity supported Standing balance-Leahy Scale: Poor Standing balance comment: heavy UE support needed, pt initially with posterior lean pulling up on RW with cues for pushing through RW                            Cognition Arousal/Alertness: Awake/alert Behavior During Therapy: [facility] for tasks assessed/performed Overall Cognitive Status: Impaired/Different from baseline Area of Impairment: Attention;Safety/judgement;Awareness                   Current Attention Level: Sustained     Safety/Judgement: Decreased awareness of safety Awareness: Emergent   General  Comments: slow processing      Exercises General Exercises - Lower Extremity Long Arc Quad: AAROM;Right;Seated;10 reps    General Comments        Pertinent Vitals/Pain Pain Score: 5  Pain Location: RLE, bil groin Pain Descriptors / Indicators: Aching;Discomfort;Grimacing;Guarding;Moaning Pain Intervention(s):  Limited activity within patient's tolerance;Monitored during session;Premedicated before session;Repositioned    Home Living                      Prior Function            PT Goals (current goals can now be found in the care plan section) Progress towards PT goals: Progressing toward goals    Frequency    Min 3X/week      PT Plan Current plan remains appropriate    Co-evaluation              AM-PAC PT "6 Clicks" Mobility   Outcome Measure  Help needed turning from your back to your side while in a flat bed without using bedrails?: A Little Help needed moving from lying on your back to sitting on the side of a flat bed without using bedrails?: A Little Help needed moving to and from a bed to a chair (including a wheelchair)?: A Lot Help needed standing up from a chair using your arms (e.g., wheelchair or bedside chair)?: A Lot Help needed to walk in hospital room?: A Lot Help needed climbing 3-5 steps with a railing? : Total 6 Click Score: 13    End of Session Equipment Utilized During Treatment: Gait belt Activity Tolerance: Patient tolerated treatment well Patient left: in chair;with call bell/phone within reach;with chair alarm set Nurse Communication: Mobility status PT Visit Diagnosis: Other abnormalities of gait and mobility (R26.89);Pain;Difficulty in walking, not elsewhere classified (R26.2);Muscle weakness (generalized) (M62.81)     Time: 4827-0786 PT Time Calculation (min) (ACUTE ONLY): 30 min  Charges:  $Gait Training: 8-22 mins $Therapeutic Activity: 8-22 mins                     [place], PT Acute Rehabilitation Services Pager: [phone] Office: [place] 03/17/2020, 9:57 AM

## 2020-03-17 NOTE — Plan of Care (Signed)
  Problem: Education: Goal: Knowledge of General Education information will improve Description: Including pain rating scale, medication(s)/side effects and non-pharmacologic comfort measures Outcome: Progressing   Problem: Health Behavior/Discharge Planning: Goal: Ability to manage health-related needs will improve Outcome: Progressing   Problem: Clinical Measurements: Goal: Ability to maintain clinical measurements within normal limits will improve Outcome: Progressing Goal: Will remain free from infection Outcome: Progressing Goal: Diagnostic test results will improve Outcome: Progressing Goal: Respiratory complications will improve Outcome: Progressing Goal: Cardiovascular complication will be avoided Outcome: Progressing   Problem: Activity: Goal: Risk for activity intolerance will decrease Outcome: Progressing   Problem: Nutrition: Goal: Adequate nutrition will be maintained Outcome: Progressing   Problem: Coping: Goal: Level of anxiety will decrease Outcome: Progressing   Problem: Elimination: Goal: Will not experience complications related to bowel motility Outcome: Progressing Goal: Will not experience complications related to urinary retention Outcome: Progressing   Problem: Pain Managment: Goal: General experience of comfort will improve Outcome: Progressing   Problem: Safety: Goal: Ability to remain free from injury will improve Outcome: Progressing   Problem: Skin Integrity: Goal: Risk for impaired skin integrity will decrease Outcome: Progressing   Problem: Clinical Measurements: Goal: Signs and symptoms of graft occlusion will improve Outcome: Progressing   Problem: Skin Integrity: Goal: Demonstration of wound healing without infection will improve Outcome: Progressing

## 2020-03-18 DIAGNOSIS — I480 Paroxysmal atrial fibrillation: Secondary | ICD-10-CM | POA: Diagnosis not present

## 2020-03-18 DIAGNOSIS — I10 Essential (primary) hypertension: Secondary | ICD-10-CM | POA: Diagnosis not present

## 2020-03-18 DIAGNOSIS — I998 Other disorder of circulatory system: Secondary | ICD-10-CM | POA: Diagnosis not present

## 2020-03-18 DIAGNOSIS — I513 Intracardiac thrombosis, not elsewhere classified: Secondary | ICD-10-CM | POA: Diagnosis not present

## 2020-03-18 LAB — GLUCOSE, CAPILLARY
Glucose-Capillary: 133 mg/dL — ABNORMAL HIGH (ref 70–99)
Glucose-Capillary: 268 mg/dL — ABNORMAL HIGH (ref 70–99)
Glucose-Capillary: 70 mg/dL (ref 70–99)
Glucose-Capillary: 228 mg/dL — ABNORMAL HIGH (ref 70–99)

## 2020-03-18 LAB — SARS CORONAVIRUS 2 BY RT PCR (HOSPITAL ORDER, PERFORMED IN ~~LOC~~ HOSPITAL LAB): SARS Coronavirus 2: NEGATIVE

## 2020-03-18 MED ORDER — ENOXAPARIN SODIUM 80 MG/0.8ML ~~LOC~~ SOLN
80.0000 mg | Freq: Two times a day (BID) | SUBCUTANEOUS | Status: DC
  Administered 2020-03-18 – 2020-03-19 (×2): 80 mg via SUBCUTANEOUS
  Filled 2020-03-18 (×2): qty 0.8

## 2020-03-18 MED ORDER — CYCLOBENZAPRINE HCL 10 MG PO TABS
5.0000 mg | ORAL_TABLET | Freq: Three times a day (TID) | ORAL | Status: DC | PRN
  Administered 2020-03-18: 5 mg via ORAL
  Filled 2020-03-18: qty 1

## 2020-03-18 NOTE — Plan of Care (Signed)
  Problem: Education: Goal: Knowledge of General Education information will improve Description: Including pain rating scale, medication(s)/side effects and non-pharmacologic comfort measures Outcome: Progressing   Problem: Health Behavior/Discharge Planning: Goal: Ability to manage health-related needs will improve Outcome: Progressing   Problem: Clinical Measurements: Goal: Ability to maintain clinical measurements within normal limits will improve Outcome: Progressing Goal: Will remain free from infection Outcome: Progressing Goal: Diagnostic test results will improve Outcome: Progressing Goal: Respiratory complications will improve Outcome: Progressing Goal: Cardiovascular complication will be avoided Outcome: Progressing   Problem: Activity: Goal: Risk for activity intolerance will decrease Outcome: Progressing   Problem: Nutrition: Goal: Adequate nutrition will be maintained Outcome: Progressing   Problem: Coping: Goal: Level of anxiety will decrease Outcome: Progressing   Problem: Elimination: Goal: Will not experience complications related to bowel motility Outcome: Progressing Goal: Will not experience complications related to urinary retention Outcome: Progressing   Problem: Pain Managment: Goal: General experience of comfort will improve Outcome: Progressing   Problem: Safety: Goal: Ability to remain free from injury will improve Outcome: Progressing   Problem: Skin Integrity: Goal: Risk for impaired skin integrity will decrease Outcome: Progressing   Problem: Clinical Measurements: Goal: Signs and symptoms of graft occlusion will improve Outcome: Progressing   Problem: Skin Integrity: Goal: Demonstration of wound healing without infection will improve Outcome: Progressing

## 2020-03-18 NOTE — TOC Progression Note (Addendum)
Transition of Care [facility]) - Progression Note    Patient Details  Name: [name] MRN: [id number] Date of Birth: [birth year]  Transition of Care [facility]) CM/SW [place], [place] Phone Number: 03/18/2020, 12:52 PM  Clinical Narrative:    4p CSW informed by Helene Kelp that patient has 3 SNF days left and will then be in co-pay days. CSW relayed the information to [name], answering all questions. [facility] confirmed bed availability for Wednesday.  3:00p Received insurance authorization Reference [id number]. Good 7/27 thru 7/29. Fax continued clinicals to [phone]. CSW attempted to update [facility] on insurance authorization, awaiting a call back.  12:50p CSW spoke with patient and patient's spouse [name] bedside to provide bed offers. Patient chose [facility]. CSW contacted [facility] who confirmed bed availability.   Expected Discharge Plan: [place] Barriers to Discharge: Continued Medical Work up, Ship broker  Expected Discharge Plan and Services Expected Discharge Plan: [place] In-house Referral: Clinical Social Work     Living arrangements for the past 2 months: Single Family Home                                       Social Determinants of Health (SDOH) Interventions    Readmission Risk Interventions No flowsheet data found.

## 2020-03-18 NOTE — Progress Notes (Addendum)
[facility] [place]  Subjective  - Feels lack of energy.  Legs over all feel better.   Objective (!) 151/98 74 98.6 F (37 C) (Oral) 18 98%  Intake/Output Summary (Last 24 hours) at 03/18/2020 0718 Last data filed at 03/17/2020 1900 Gross per 24 hour  Intake --  Output 800 ml  Net -800 ml    B groin incisions healing well Doppler DP/PT B signals, motor and sensation intact.  Clean dressing right LE changed by RN this am. Heart RRR  Assessment/Planning: 74 y.o. male is s/p:  thrombectomy of bilateral limbs of EVAR with suspected atrial thrombus   Patent EVAR and B LE with doppler signals intact.  Encouraged ambulation as tolerates. 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  Per CT surgery, not a surgical candidate.   Roxy Horseman 03/18/2020 7:18 AM --  Laboratory Lab Results: Recent Labs    03/16/20 0244 03/17/20 0152  WBC 8.3 9.3  HGB 9.0* 9.4*  HCT 28.5* 30.2*  PLT 160 149*   BMET Recent Labs    03/17/20 0152  NA 135  K 3.9  CL 102  CO2 24  GLUCOSE 133*  BUN 11  CREATININE 1.08  CALCIUM 9.3    COAG Lab Results  Component Value Date   INR 1.3 (H) 03/06/2020   INR 1.1 01/15/2020   INR 1.0 01/14/2020   No results found for: PTT   I have seen and evaluated the patient. I agree with the PA note as documented above.   Marty Heck, MD [facility] [place] Office: [phone]

## 2020-03-18 NOTE — Progress Notes (Signed)
PROGRESS NOTE        PATIENT DETAILS Name: [name] Age: 74 y.o. Sex: male Date of Birth: Apr 20, 1946 Admit Date: 03/06/2020 Admitting Physician Garner Nash, DO YYQ:[facility]  Brief Narrative: Patient is a 74 y.o. male with hx AAA s/p repair in 12/2019 with endovascular stent graft s/p left iliac thrombectomy and left external iliac stent, PVD, HTN, DM2, CAD,  atrial fibrillation presented with right ischemic leg-further evaluation revealed occluded right/left limb of the aorto biiliac endograft-underwent emergent thrombectomy and right lower extremity 4 compartment fasciotomy-further evaluation with a intraoperative TEE showed a mobile left atrial thrombus.  Subsequent CT imaging study showed a large left lung mass-eroding into the mediastinum into the pulmonary vein and into the left atrium.  Suspicion for tumor embolism causing ischemic leg-see below for further details.  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  Significant studies: 7/15>> CT angio lower extremities: Right limb of the aortic stent graft occluded 7/21>> carotid Doppler: None hemodynamically significant stenosis bilaterally 7/21>> CT coronary: Large left upper lobe mass-encasing the left hilum-occluding left upper lobe/lingula bronchi with signs of postobstructive pneumonitis in the left upper lobe-mass invades the left upper lobe pulmonary vein with tumor thrombus in the left atrium extending to the mitral valve  Antimicrobial therapy: None  Microbiology data: None  Pathology: 7/15>>Thrombus-Right iliac thrombectomy:Poorly differentiated malignancy with extensive necrosis   Procedures : 7/20>> intraoperative TEE: Large lobulated mobile thrombus in the left atrium 7/20>>  closure of right leg fasciotomy 7/15>> intraoperative TEE: Large LA thrombus 7/15>> Thrombectomy of right limb, left limb of aorto biiliac endograft, right lower extremity 4 compartment fasciotomy  Consults: PCCM, VVS, CTVS, Hematology/oncology  DVT Prophylaxis : SCDs Start: 03/06/20 1559 SQ Lovenox  Subjective: Lying comfortably in bed-no issues overnight.  Assessment/Plan: Acute bilateral occlusion of aortoiliac grafts with right ischemic leg-s/p thrombectomy/4 compartment fasciotomy likely secondary to tumor embolism from large atrial thrombus/tumor due to lung malignancy eroding into the mediastinum: Vascular surgery following-s/p thrombectomy.  Discussed with pathology (Dr. 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  See discussion below.  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  CT surgery initially contemplated sternotomy and clot removal-but given diagnosis of malignancy-he is now deemed not operative candidate.  Left lung mass-and known history of squamous cell cancer of the lung: Seen on CT imaging studies-upon discussion with patient on 7/23-he confirmed that he was told he has "lung cancer" sometime either in 2019-[facility]-but he decided not to proceed with any treatment.  Obtained records from daughter [place] on 7/26-he had bronchoscopy with biopsy-and had a extensive work-up-he was diagnosed with invasive squamous cell carcinoma.  Appreciate oncology input-oncology will arrange for outpatient follow-up-continue goals of care discussion. PAF: Had a run of A. fib with RVR earlier this morning-back in sinus rhythm-remains on full dose Lovenox.  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  Normocytic anemia: Likely secondary to critical illness-no indication of GI loss.  Follow for  now.  COPD: Not in exacerbation-continue bronchodilators.  HTN: BP stable  CAD-with history of PCI to RCA in 2006: No anginal symptoms-continue Plavix/statin  HLD: Continue statin  DM-2: CBG  stable-continue SSI   Recent Labs    03/17/20 2129 03/18/20 0620 03/18/20 1131  GLUCAP 128* 133* 268*   Pressure injury: Appreciate wound care input-have initiated 2 days of Diflucan.  Pressure Injury 03/12/20 Buttocks Mid Stage 3 -  Full thickness tissue loss. Subcutaneous fat may be visible but bone, tendon or muscle are NOT exposed. (Active)  03/12/20 2000  Location: Buttocks  Location Orientation: Mid  Staging: Stage 3 -  Full thickness tissue loss. Subcutaneous fat may be visible but bone, tendon or muscle are NOT exposed.  Wound Description (Comments):   Present on Admission:     Diet: Diet Order            Diet Heart Room service appropriate? Yes; Fluid consistency: Thin  Diet effective now                  Code Status: Full code   Family Communication: Left a voicemail for spouse  Disposition Plan: Status is: Inpatient  Remains inpatient appropriate because:Inpatient level of care appropriate due to severity of illness   Dispo: The patient is from: Home              Anticipated d/c is to: TBD              Anticipated d/c date is: > 1 days              Patient currently is medically stable to d/c.  Barriers to Discharge: Awaiting SNF bed/insurance authorization  Antimicrobial agents: Anti-infectives (From admission, onward)   Start     Dose/Rate Route Frequency Ordered Stop   03/13/20 1000  fluconazole (DIFLUCAN) tablet 100 mg        100 mg Oral Daily 03/13/20 0858 03/14/20 0910   03/07/20 0200  ceFAZolin (ANCEF) IVPB 2g/100 mL premix        2 g 200 mL/hr over 30 Minutes Intravenous Every 8 hours 03/06/20 2112 03/07/20 1052   03/06/20 1958  ceFAZolin (ANCEF) 2-4 GM/100ML-% IVPB       Note to Pharmacy: Alba Cory   : cabinet override      03/06/20 1958  03/07/20 0759       Time spent: 15 minutes-Greater than 50% of this time was spent in counseling, explanation of diagnosis, planning of further management, and coordination of care.  MEDICATIONS: Scheduled Meds: . atorvastatin  20 mg Oral Daily  . clopidogrel  75 mg Oral Daily  . diltiazem  90 mg Oral Q6H  . docusate sodium  100 mg Oral Daily  . enoxaparin (LOVENOX) injection  80 mg Subcutaneous Q12H  . Gerhardt's butt cream   Topical TID  . insulin aspart  0-15 Units Subcutaneous TID WC  . pantoprazole (PROTONIX) IV  40 mg Intravenous Daily  . umeclidinium bromide  1 puff Inhalation Daily   Continuous Infusions: . sodium chloride Stopped (03/08/20 1008)  . sodium chloride     PRN Meds:.sodium chloride, acetaminophen **OR** acetaminophen, albuterol, alum & mag hydroxide-simeth, guaiFENesin-dextromethorphan, metoprolol tartrate, morphine injection, ondansetron, oxyCODONE, phenol, polyethylene glycol, white petrolatum   PHYSICAL EXAM: Vital signs: Vitals:   03/18/20 1139 03/18/20 1141 03/18/20 1142 03/18/20 1143  BP:      Pulse:      Resp: 20 19 16 17   Temp:      TempSrc:      SpO2:      Weight:      Height:       Filed Weights   03/16/20 0358 03/17/20  0500 03/18/20 0500  Weight: 88.3 kg 89.7 kg 85.8 kg   Body mass index is 23.64 kg/m.   Gen Exam:Alert awake-not in any distress HEENT:atraumatic, normocephalic Chest: B/L clear to auscultation anteriorly CVS:S1S2 regular Abdomen:soft non tender, non distended Extremities:no edema Neurology: Non focal Skin: no rash  I have personally reviewed following labs and imaging studies  LABORATORY DATA: CBC: Recent Labs  Lab 03/13/20 0304 03/14/20 0830 03/15/20 0246 03/16/20 0244 03/17/20 0152  WBC 10.6* 9.2 7.7 8.3 9.3  HGB 8.6* 8.4* 9.1* 9.0* 9.4*  HCT 27.3* 26.7* 28.1* 28.5* 30.2*  MCV 88.3 87.5 89.2 87.2 87.0  PLT 139* 147* 148* 160 149*    Basic Metabolic Panel: Recent Labs  Lab 03/12/20 0251  03/13/20 0304 03/15/20 0246 03/17/20 0152  NA 136 135 137 135  K 4.0 3.6 4.0 3.9  CL 103 102 104 102  CO2 24 23 23 24   GLUCOSE 155* 134* 142* 133*  BUN 12 10 10 11   CREATININE 1.06 0.99 0.92 1.08  CALCIUM 9.0 8.8* 9.0 9.3  MG 1.8  --   --   --   PHOS 2.9 2.5  --   --     GFR: Estimated Creatinine Clearance: 72.8 mL/min (by C-G formula based on SCr of 1.08 mg/dL).  Liver Function Tests: No results for input(s): AST, ALT, ALKPHOS, BILITOT, PROT, ALBUMIN in the last 168 hours. No results for input(s): LIPASE, AMYLASE in the last 168 hours. No results for input(s): AMMONIA in the last 168 hours.  Coagulation Profile: No results for input(s): INR, PROTIME in the last 168 hours.  Cardiac Enzymes: No results for input(s): CKTOTAL, CKMB, CKMBINDEX, TROPONINI in the last 168 hours.  BNP (last 3 results) No results for input(s): PROBNP in the last 8760 hours.  Lipid Profile: No results for input(s): CHOL, HDL, LDLCALC, TRIG, CHOLHDL, LDLDIRECT in the last 72 hours.  Thyroid Function Tests: No results for input(s): TSH, T4TOTAL, FREET4, T3FREE, THYROIDAB in the last 72 hours.  Anemia Panel: No results for input(s): VITAMINB12, FOLATE, FERRITIN, TIBC, IRON, RETICCTPCT in the last 72 hours.  Urine analysis:    Component Value Date/Time   COLORURINE YELLOW 07/02/2016 2112   APPEARANCEUR CLEAR 07/02/2016 2112   LABSPEC 1.006 07/02/2016 2112   PHURINE 5.5 07/02/2016 2112   GLUCOSEU NEGATIVE 07/02/2016 2112   HGBUR LARGE (A) 07/02/2016 2112   BILIRUBINUR NEGATIVE 07/02/2016 2112   KETONESUR NEGATIVE 07/02/2016 2112   PROTEINUR NEGATIVE 07/02/2016 2112   NITRITE NEGATIVE 07/02/2016 2112   LEUKOCYTESUR TRACE (A) 07/02/2016 2112    Sepsis Labs: Lactic Acid, Venous    Component Value Date/Time   LATICACIDVEN 2.4 ([place]) 03/06/2020 2158    MICROBIOLOGY: Recent Results (from the past 240 hour(s))  SARS Coronavirus 2 by RT PCR (hospital order, performed in [facility] hospital  lab) Nasopharyngeal     Status: None   Collection Time: 03/18/20  9:05 AM   Specimen: Nasopharyngeal  Result Value Ref Range Status   SARS Coronavirus 2 NEGATIVE NEGATIVE Final    Comment: (NOTE) SARS-CoV-2 target nucleic acids are NOT DETECTED.  The SARS-CoV-2 RNA is generally detectable in upper and lower respiratory specimens during the acute phase of infection. The lowest concentration of SARS-CoV-2 viral copies this assay can detect is 250 copies / mL. A negative result does not preclude SARS-CoV-2 infection and should not be used as the sole basis for treatment or other patient management decisions.  A negative result may occur with improper specimen collection / handling,  submission of specimen other than nasopharyngeal swab, presence of viral mutation(s) within the areas targeted by this assay, and inadequate number of viral copies (<250 copies / mL). A negative result must be combined with clinical observations, patient history, and epidemiological information.  Fact Sheet for Patients:   StrictlyIdeas.no  Fact Sheet for Healthcare Providers: BankingDealers.co.za  This test is not yet approved or  cleared by the [place] FDA and has been authorized for detection and/or diagnosis of SARS-CoV-2 by FDA under an Emergency Use Authorization (EUA).  This EUA will remain in effect (meaning this test can be used) for the duration of the COVID-19 declaration under Section 564(b)(1) of the Act, 21 U.S.C. section 360bbb-3(b)(1), unless the authorization is terminated or revoked sooner.  Performed at [facility], [place] [address], [place], [place] [address]     RADIOLOGY STUDIES/RESULTS: No results found.   LOS: 12 days   Oren Binet, MD  [facility]    To contact the attending provider between 7A-7P or the covering provider during after hours 7P-7A, please log into the web site [email or web address] and access  using universal [place] password for that web site. If you do not have the password, please call the hospital operator.  03/18/2020, 1:16 PM

## 2020-03-19 DIAGNOSIS — R441 Visual hallucinations: Secondary | ICD-10-CM | POA: Diagnosis not present

## 2020-03-19 DIAGNOSIS — N400 Enlarged prostate without lower urinary tract symptoms: Secondary | ICD-10-CM | POA: Diagnosis not present

## 2020-03-19 DIAGNOSIS — J449 Chronic obstructive pulmonary disease, unspecified: Secondary | ICD-10-CM | POA: Diagnosis not present

## 2020-03-19 DIAGNOSIS — I10 Essential (primary) hypertension: Secondary | ICD-10-CM | POA: Diagnosis not present

## 2020-03-19 DIAGNOSIS — Z7401 Bed confinement status: Secondary | ICD-10-CM | POA: Diagnosis not present

## 2020-03-19 DIAGNOSIS — R1312 Dysphagia, oropharyngeal phase: Secondary | ICD-10-CM | POA: Diagnosis not present

## 2020-03-19 DIAGNOSIS — R5381 Other malaise: Secondary | ICD-10-CM | POA: Diagnosis not present

## 2020-03-19 DIAGNOSIS — Z029 Encounter for administrative examinations, unspecified: Secondary | ICD-10-CM | POA: Diagnosis not present

## 2020-03-19 DIAGNOSIS — I998 Other disorder of circulatory system: Secondary | ICD-10-CM | POA: Diagnosis not present

## 2020-03-19 DIAGNOSIS — E1351 Other specified diabetes mellitus with diabetic peripheral angiopathy without gangrene: Secondary | ICD-10-CM | POA: Diagnosis not present

## 2020-03-19 DIAGNOSIS — I714 Abdominal aortic aneurysm, without rupture: Secondary | ICD-10-CM | POA: Diagnosis not present

## 2020-03-19 DIAGNOSIS — L89303 Pressure ulcer of unspecified buttock, stage 3: Secondary | ICD-10-CM | POA: Diagnosis not present

## 2020-03-19 DIAGNOSIS — I480 Paroxysmal atrial fibrillation: Secondary | ICD-10-CM | POA: Diagnosis not present

## 2020-03-19 DIAGNOSIS — I25118 Atherosclerotic heart disease of native coronary artery with other forms of angina pectoris: Secondary | ICD-10-CM | POA: Diagnosis not present

## 2020-03-19 DIAGNOSIS — Z7189 Other specified counseling: Secondary | ICD-10-CM | POA: Diagnosis not present

## 2020-03-19 DIAGNOSIS — D696 Thrombocytopenia, unspecified: Secondary | ICD-10-CM | POA: Diagnosis not present

## 2020-03-19 DIAGNOSIS — Z48812 Encounter for surgical aftercare following surgery on the circulatory system: Secondary | ICD-10-CM | POA: Diagnosis not present

## 2020-03-19 DIAGNOSIS — T82898D Other specified complication of vascular prosthetic devices, implants and grafts, subsequent encounter: Secondary | ICD-10-CM | POA: Diagnosis not present

## 2020-03-19 DIAGNOSIS — F339 Major depressive disorder, recurrent, unspecified: Secondary | ICD-10-CM | POA: Diagnosis not present

## 2020-03-19 DIAGNOSIS — R41841 Cognitive communication deficit: Secondary | ICD-10-CM | POA: Diagnosis not present

## 2020-03-19 DIAGNOSIS — I513 Intracardiac thrombosis, not elsewhere classified: Secondary | ICD-10-CM | POA: Diagnosis not present

## 2020-03-19 DIAGNOSIS — C3402 Malignant neoplasm of left main bronchus: Secondary | ICD-10-CM | POA: Diagnosis not present

## 2020-03-19 DIAGNOSIS — M2550 Pain in unspecified joint: Secondary | ICD-10-CM | POA: Diagnosis not present

## 2020-03-19 DIAGNOSIS — I700 Atherosclerosis of aorta: Secondary | ICD-10-CM | POA: Diagnosis not present

## 2020-03-19 DIAGNOSIS — E1365 Other specified diabetes mellitus with hyperglycemia: Secondary | ICD-10-CM | POA: Diagnosis not present

## 2020-03-19 DIAGNOSIS — M6281 Muscle weakness (generalized): Secondary | ICD-10-CM | POA: Diagnosis not present

## 2020-03-19 LAB — GLUCOSE, CAPILLARY
Glucose-Capillary: 149 mg/dL — ABNORMAL HIGH (ref 70–99)
Glucose-Capillary: 223 mg/dL — ABNORMAL HIGH (ref 70–99)

## 2020-03-19 LAB — CBC
HCT: 29.2 % — ABNORMAL LOW (ref 39.0–52.0)
Hemoglobin: 9.5 g/dL — ABNORMAL LOW (ref 13.0–17.0)
MCH: 28.3 pg (ref 26.0–34.0)
MCHC: 32.5 g/dL (ref 30.0–36.0)
MCV: 86.9 fL (ref 80.0–100.0)
Platelets: 128 10*3/uL — ABNORMAL LOW (ref 150–400)
RBC: 3.36 MIL/uL — ABNORMAL LOW (ref 4.22–5.81)
RDW: 13.7 % (ref 11.5–15.5)
WBC: 10.2 10*3/uL (ref 4.0–10.5)
nRBC: 0.2 % (ref 0.0–0.2)

## 2020-03-19 MED ORDER — PANTOPRAZOLE SODIUM 40 MG PO TBEC
40.0000 mg | DELAYED_RELEASE_TABLET | Freq: Every day | ORAL | Status: AC
Start: 2020-03-19 — End: ?

## 2020-03-19 MED ORDER — GERHARDT'S BUTT CREAM: 1.0000 "application " | TOPICAL_CREAM | Freq: Three times a day (TID) | CUTANEOUS | Status: DC

## 2020-03-19 MED ORDER — ATORVASTATIN CALCIUM 20 MG PO TABS: 20.0000 mg | ORAL_TABLET | Freq: Every day | ORAL | Status: DC

## 2020-03-19 MED ORDER — ACETAMINOPHEN 325 MG PO TABS: 650.0000 mg | ORAL_TABLET | Freq: Four times a day (QID) | ORAL | Status: AC | PRN

## 2020-03-19 MED ORDER — UMECLIDINIUM BROMIDE 62.5 MCG/INH IN AEPB: 1.0000 | INHALATION_SPRAY | Freq: Every day | RESPIRATORY_TRACT | Status: AC

## 2020-03-19 MED ORDER — OXYCODONE HCL 5 MG PO TABS: 5.0000 mg | ORAL_TABLET | Freq: Four times a day (QID) | ORAL | 0 refills | Status: DC | PRN

## 2020-03-19 MED ORDER — POLYETHYLENE GLYCOL 3350 17 G PO PACK: 17.0000 g | PACK | Freq: Every day | ORAL | 0 refills | Status: AC | PRN

## 2020-03-19 MED ORDER — ENOXAPARIN SODIUM 80 MG/0.8ML ~~LOC~~ SOLN: 80.0000 mg | Freq: Two times a day (BID) | SUBCUTANEOUS | Status: AC

## 2020-03-19 MED ORDER — INSULIN ASPART 100 UNIT/ML ~~LOC~~ SOLN: SUBCUTANEOUS | 11 refills | Status: DC

## 2020-03-19 NOTE — Care Management Important Message (Signed)
Important Message  Patient Details  Name: [name] MRN: [id number] Date of Birth: [birth year]   Medicare Important Message Given:  Yes     Ishan Sanroman 03/19/2020, 3:10 PM

## 2020-03-19 NOTE — Plan of Care (Signed)
  Problem: Education: Goal: Knowledge of General Education information will improve Description: Including pain rating scale, medication(s)/side effects and non-pharmacologic comfort measures Outcome: Adequate for Discharge   Problem: Health Behavior/Discharge Planning: Goal: Ability to manage health-related needs will improve Outcome: Adequate for Discharge   Problem: Clinical Measurements: Goal: Ability to maintain clinical measurements within normal limits will improve Outcome: Adequate for Discharge Goal: Will remain free from infection Outcome: Adequate for Discharge Goal: Diagnostic test results will improve Outcome: Adequate for Discharge Goal: Respiratory complications will improve Outcome: Adequate for Discharge Goal: Cardiovascular complication will be avoided Outcome: Adequate for Discharge   Problem: Activity: Goal: Risk for activity intolerance will decrease Outcome: Adequate for Discharge   Problem: Nutrition: Goal: Adequate nutrition will be maintained Outcome: Adequate for Discharge   Problem: Coping: Goal: Level of anxiety will decrease Outcome: Adequate for Discharge   Problem: Elimination: Goal: Will not experience complications related to bowel motility Outcome: Adequate for Discharge Goal: Will not experience complications related to urinary retention Outcome: Adequate for Discharge   Problem: Pain Managment: Goal: General experience of comfort will improve Outcome: Adequate for Discharge   Problem: Safety: Goal: Ability to remain free from injury will improve Outcome: Adequate for Discharge   Problem: Skin Integrity: Goal: Risk for impaired skin integrity will decrease Outcome: Adequate for Discharge   Problem: Clinical Measurements: Goal: Signs and symptoms of graft occlusion will improve Outcome: Adequate for Discharge   Problem: Skin Integrity: Goal: Demonstration of wound healing without infection will improve Outcome: Adequate for  Discharge

## 2020-03-19 NOTE — Plan of Care (Signed)
  Problem: Acute Rehab PT Goals(only PT should resolve) Goal: Pt Will Go Supine/Side To Sit Outcome: Adequate for Discharge Goal: Patient Will Transfer Sit To/From Stand Outcome: Adequate for Discharge Goal: Pt Will Transfer Bed To Chair/Chair To Bed Outcome: Adequate for Discharge Goal: Pt Will Ambulate Outcome: Adequate for Discharge Goal: Pt Will Go Up/Down Stairs Outcome: Adequate for Discharge   Problem: Acute Rehab OT Goals (only OT should resolve) Goal: Pt. Will Perform Grooming Outcome: Adequate for Discharge Goal: Pt. Will Perform Lower Body Bathing Outcome: Adequate for Discharge Goal: Pt. Will Perform Lower Body Dressing Outcome: Adequate for Discharge Goal: Pt. Will Transfer To Toilet Outcome: Adequate for Discharge Goal: Pt. Will Perform Toileting-Clothing Manipulation Outcome: Adequate for Discharge Goal: OT Additional ADL Goal #1 Outcome: Adequate for Discharge

## 2020-03-19 NOTE — Progress Notes (Signed)
Patient discharged, waiting in room until [facility]AR arrives.  Monitors discontinued and CCMD notified.  Patient to leave condom catheter on until the arrive

## 2020-03-19 NOTE — TOC Transition Note (Signed)
Transition of Care (TOC) - CM/SW Discharge Note *03/19/20 - Discharged to [facility]   Patient Details  Name: [name] MRN: [id number] Date of Birth: [birth year]  Transition of Care [facility] CM/SW Contact:  Sable Feil, LCSW Phone Number: 03/19/2020, 3:06 PM   Clinical Narrative: Patient medically stable for discharge and authorization received for patient to discharge to [facility]. CSW talked with patient and his wife at bedside regarding discharge and patient visited and informed and  wife contacted by phone after ambulance transport arranged.  Discharge clinicals transmitted to facility and admissions director Perrin Smack provided with insurance authorization information.   Final next level of care: [place] ([place]) Barriers to Discharge: Barriers Resolved   Patient Goals and CMS Choice Patient states their goals for this hospitalization and ongoing recovery are:: Pateint and wife want him to get stronger before returnng home CMS Medicare.gov Compare Post Acute Care list provided to:: Patient (Patient and his wife) Choice offered to / list presented to : Patient  Discharge Placement   Existing PASRR number confirmed : 03/13/20          Patient chooses bed at: [facility] Patient to be transferred to facility by: Non-emergency ambulance transport Name of family member notified: [name] - wife at bedside Patient and family notified of of transfer: 03/19/20  Discharge Plan and Services In-house Referral: Clinical Social Work                                  Social Determinants of Health (SDOH) Interventions  No SDOH interventions needed or requested prior to discharge   Readmission Risk Interventions No flowsheet data found.

## 2020-03-19 NOTE — Discharge Summary (Signed)
PATIENT DETAILS Name: [name] Age: 74 y.o. Sex: male Date of Birth: 03/14/1946 MRN: [id number]. Admitting Physician: Garner Nash, DO [id number]  Admit Date: 03/06/2020 Discharge date: 03/19/2020  Recommendations for Outpatient Follow-up:  1. Follow up with PCP in 1-2 weeks 2. Please obtain CMP/CBC in one week 3. Please ensure follow up with hematology/oncology, vascular surgery  Admitted From:  Home  Disposition: SNF   Home Health: No  Equipment/Devices: None  Discharge Condition: Stable  CODE STATUS: FULL CODE  Diet recommendation:  Diet Order            Diet - low sodium heart healthy           Diet Heart Room service appropriate? Yes; Fluid consistency: Thin  Diet effective now                 Brief Narrative: Patient is a 74 y.o. male with hx AAA s/p repair in 12/2019 with endovascular stent graft s/p left iliac thrombectomy and left external iliac stent, PVD, HTN, DM2, CAD,  atrial fibrillation presented with right ischemic leg-further evaluation revealed occluded right/left limb of the aorto biiliac endograft-underwent emergent thrombectomy and right lower extremity 4 compartment fasciotomy-further evaluation with a intraoperative TEE showed a mobile left atrial thrombus.  Subsequent CT imaging study showed a large left lung mass-eroding into the mediastinum into the pulmonary vein and into the left atrium.  Suspicion for tumor embolism causing ischemic leg,turns out that in 2019-patient was diagnosed with squamous cell carcinoma of the lung at [facility] for which he refused treatment.  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  Significant studies: 7/15>> CT angio lower extremities: Right limb of the aortic stent graft occluded 7/21>>  carotid Doppler: None hemodynamically significant stenosis bilaterally 7/21>> CT coronary: Large left upper lobe mass-encasing the left hilum-occluding left upper lobe/lingula bronchi with signs of postobstructive pneumonitis in the left upper lobe-mass invades the left upper lobe pulmonary vein with tumor thrombus in the left atrium extending to the mitral valve  Antimicrobial therapy: None  Microbiology data: None  Pathology: 7/15>>Thrombus-Right iliac thrombectomy:Poorly differentiated malignancy with extensive necrosis   Procedures : 7/20>> intraoperative TEE: Large lobulated mobile thrombus in the left atrium 7/20>> closure of right leg fasciotomy 7/15>> intraoperative TEE: Large LA thrombus 7/15>> Thrombectomy of right limb, left limb of aorto biiliac endograft, right lower extremity 4 compartment fasciotomy  Consults: PCCM, VVS, CTVS, Hematology/oncology  Brief Hospital Course: Acute bilateral occlusion of aortoiliac grafts with right ischemic leg-s/p thrombectomy/4 compartment fasciotomy likely secondary to tumor embolism from large atrial thrombus/tumor due to lung malignancy eroding into the mediastinum: Vascular surgery following-s/p thrombectomy.  Discussed with pathology (Dr. Melina Copa) on 7/23-thrombectomy/thrombus biopsy-confirms malignanc. See discussion below.  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  CT surgery initially contemplated sternotomy and clot removal-but given diagnosis of malignancy-he is now deemed not operative candidate.  After discussion with oncology-recommendations are to keep on Lovenox long-term-as patient failed Eliquis.  Left lung mass-and known history of squamous cell cancer of the lung: Seen on CT imaging studies-upon discussion with patient on 7/23-he confirmed that he was told he has "lung cancer" sometime either in 2019-at [facility]-but he decided not to proceed with any  treatment.  Obtained records from daughter [place] on 7/26-he had bronchoscopy with biopsy-and had  a extensive work-up-he was diagnosed with invasive squamous cell carcinoma.  Appreciate oncology input-oncology will arrange for outpatient follow-up-continue goals of care discussion.  PAF: Had a run of A. fib with RVR earlier this morning-back in sinus rhythm-remains on full dose Lovenox.  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  Normocytic anemia: Likely secondary to critical illness-no indication of GI loss.  Follow for now.  COPD: Not in exacerbation-continue bronchodilators.  HTN: BP stable  CAD-with history of PCI to RCA in 2006: No anginal symptoms-continue Plavix/statin  HLD: Continue statin  DM-2: CBG stable-continue SSI   RN pressure injury documentation: Pressure Injury 03/12/20 Buttocks Mid Stage 3 -  Full thickness tissue loss. Subcutaneous fat may be visible but bone, tendon or muscle are NOT exposed. (Active)  03/12/20 2000  Location: Buttocks  Location Orientation: Mid  Staging: Stage 3 -  Full thickness tissue loss. Subcutaneous fat may be visible but bone, tendon or muscle are NOT exposed.  Wound Description (Comments):   Present on Admission:     Discharge Diagnoses:  Principal Problem:   Vascular occlusion Active Problems:   DM (diabetes mellitus) ([place])   Essential hypertension   CAD (coronary artery disease)   Tobacco abuse   AAA (abdominal aortic aneurysm) without rupture (HCC)   Femoral-tibial bypass graft occlusion, right (HCC)   Aortic occlusion (HCC)   AF (paroxysmal atrial fibrillation) (HCC)   Left atrial thrombus   Pressure injury of skin   Discharge Instructions:  Activity:  As tolerated with Full fall precautions use walker/cane & assistance as needed  Discharge Instructions    Ambulatory referral to Hematology   Complete by: As directed    Needs follow  up with Dr [name]   Call MD for:  extreme fatigue   Complete by: As directed    Call MD for:  persistant dizziness or light-headedness   Complete by: As directed    Call MD for:  persistant nausea and vomiting   Complete by: As directed    Call MD for:  redness, tenderness, or signs of infection (pain, swelling, redness, odor or green/yellow discharge around incision site)   Complete by: As directed    Diet - low sodium heart healthy   Complete by: As directed    Discharge wound care:   Complete by: As directed    1.Wound care  Daily      Comments: Dress with xeroform gauze or petroleum gauze and wrap with kerlex   2.Wound care to area of intertriginous dermatitis in the intragluteal cleft:  cleanse with soap and water, rinse and pat dry. Apply a thin layer of Gerhart's Butt cream. Leave open to air. Minimize time in the supine position by positioning from side to side and exposing the affected tissue to air   Increase activity slowly   Complete by: As directed      Allergies as of 03/19/2020      Reactions   Erythromycin Shortness Of Breath, Swelling   Simvastatin Other (See Comments)   CAUSE MUSCLE PAIN,.   Statins Other (See Comments)   Muscle pain      Medication List    STOP taking these medications   apixaban 5 MG Tabs tablet Commonly known as: ELIQUIS   Baclofen 5 MG Tabs   gabapentin 300 MG capsule Commonly known as: NEURONTIN   lisinopril 20 MG tablet Commonly known as: ZESTRIL   oxyCODONE-acetaminophen 5-325 MG tablet Commonly known as: PERCOCET/ROXICET  TAKE these medications   acetaminophen 325 MG tablet Commonly known as: TYLENOL Take 2 tablets (650 mg total) by mouth every 6 (six) hours as needed for mild pain (or temp >/= 101 F). What changed:   when to take this  reasons to take this   albuterol 108 (90 Base) MCG/ACT inhaler Commonly known as: VENTOLIN HFA Inhale 2 puffs into the lungs every 6 (six) hours as needed for wheezing or  shortness of breath.   atorvastatin 20 MG tablet Commonly known as: LIPITOR Take 1 tablet (20 mg total) by mouth daily.   Calcium 1000 + D 1000-800 MG-UNIT Tabs Generic drug: Calcium Carb-Cholecalciferol Take 1 tablet by mouth daily.   clopidogrel 75 MG tablet Commonly known as: PLAVIX Take 75 mg by mouth daily.   diclofenac sodium 1 % Gel Commonly known as: VOLTAREN Apply 4 g topically 3 (three) times daily as needed (pain).   enoxaparin 80 MG/0.8ML injection Commonly known as: LOVENOX Inject 0.8 mLs (80 mg total) into the skin every 12 (twelve) hours.   finasteride 5 MG tablet Commonly known as: PROSCAR Take 5 mg by mouth daily.   Gerhardt's butt cream Crea Apply 1 application topically 3 (three) times daily. Apply in a thin layer to affected area in the gluteal cleft three times daily and PRN after defecation and cleansing.   hydroxypropyl methylcellulose / hypromellose 2.5 % ophthalmic solution Commonly known as: ISOPTO TEARS / GONIOVISC Place 1-2 drops into both eyes in the morning and at bedtime.   insulin aspart 100 UNIT/ML injection Commonly known as: novoLOG insulin aspart (novoLOG) injection 0-15 Units 0-15 Units, Subcutaneous, 3 times daily with meals CBG < 70: Implement Hypoglycemia measures CBG 70 - 120: 0 units CBG 121 - 150: 2 units CBG 151 - 200: 3 units CBG 201 - 250: 5 units CBG 251 - 300: 8 units CBG 301 - 350: 11 units CBG 351 - 400: 15 units CBG > 400: call MD   loratadine 10 MG tablet Commonly known as: CLARITIN Take 10 mg by mouth daily.   Lysine 1000 MG Tabs Take 1,000 mg by mouth daily.   metFORMIN 500 MG 24 hr tablet Commonly known as: GLUCOPHAGE-XR Take 1,000 mg by mouth in the morning and at bedtime.   nitroGLYCERIN 0.4 MG SL tablet Commonly known as: NITROSTAT Place 0.4 mg under the tongue every 5 (five) minutes as needed for chest pain.   Olodaterol HCl 2.5 MCG/ACT Aers Inhale 2 puffs into the lungs daily.   oxyCODONE 5 MG  immediate release tablet Commonly known as: Oxy IR/ROXICODONE Take 1 tablet (5 mg total) by mouth every 6 (six) hours as needed for moderate pain.   pantoprazole 40 MG tablet Commonly known as: Protonix Take 1 tablet (40 mg total) by mouth daily.   petrolatum-hydrophilic-aloe vera ointment Apply 1 application topically 3 (three) times daily.   polyethylene glycol 17 g packet Commonly known as: MIRALAX / GLYCOLAX Take 17 g by mouth daily as needed for moderate constipation.   tamsulosin 0.4 MG Caps capsule Commonly known as: FLOMAX Take 0.4 mg by mouth at bedtime.   umeclidinium bromide 62.5 MCG/INH Aepb Commonly known as: INCRUSE ELLIPTA Inhale 1 puff into the lungs daily. Start taking on: March 20, 2020   VITAMIN B 12 PO Take 1 tablet by mouth daily.            Discharge Care Instructions  (From admission, onward)         Start  Ordered   03/19/20 0000  Discharge wound care:       Comments: 1.Wound care  Daily      Comments: Dress with xeroform gauze or petroleum gauze and wrap with kerlex   2.Wound care to area of intertriginous dermatitis in the intragluteal cleft:  cleanse with soap and water, rinse and pat dry. Apply a thin layer of Gerhart's Butt cream. Leave open to air. Minimize time in the supine position by positioning from side to side and exposing the affected tissue to air   03/19/20 0950          Contact information for follow-up providers    [facility]. Schedule an appointment as soon as possible for a visit in 1 week(s).   Specialty: General Practice Contact information: [place] [address] [phone]        [name], MD. Schedule an appointment as soon as possible for a visit in 2 week(s).   Specialties: Cardiology, Radiology, Interventional Cardiology Contact information: [address] [place] [address] [phone]        Orson Slick, MD. Schedule an  appointment as soon as possible for a visit in 2 week(s).   Specialty: Hematology and Oncology Contact information: [place] [address] [phone]            Contact information for after-discharge care    Destination    [facility] Preferred SNF .   Service: Skilled Nursing Contact information: [address] [place] [address] [phone]                 Allergies  Allergen Reactions  . Erythromycin Shortness Of Breath and Swelling  . Simvastatin Other (See Comments)    CAUSE MUSCLE PAIN,.  . Statins Other (See Comments)    Muscle pain    Other Procedures/Studies: DG Abd 1 View  Result Date: 03/06/2020 CLINICAL DATA:  Arteriogram. EXAM: ABDOMEN - 1 VIEW COMPARISON:  March 06, 2020 FINDINGS: The patient has undergone abdominal aortic angiogram. An endograft is noted. The left limb of the graft appears to be patent. Several images demonstrate non patency of the right limb. IMPRESSION: The patient has undergone abdominal aortic angiogram. Please see separate operative report for complete details. Electronically Signed   By: Constance Holster M.D.   On: 03/06/2020 19:49   CT CHEST W CONTRAST  Addendum Date: 03/14/2020   ADDENDUM REPORT: 03/14/2020 13:45 ADDENDUM: Thoracic surgeon Dr. Servando Snare called for a second opinion regarding the location of the cardiac mass. The irregular soft tissue mass within the left atrial appendage, left superior pulmonary vein and left aspect of the left atrium extending to mitral valve and potentially to the entrance of the left ventricle is contiguous with the large central left upper lobe lung mass and is favored to represent direct tumor invasion of these structures. While a component of bland thrombus is possible at the periphery of this cardiac mass, this is felt to largely represent direct cardiac tumor invasion. These results were discussed by telephone by Dr. Polly Cobia at  03/14/2020 at 1:44 pm with Dr. Servando Snare, who verbally acknowledged these results. Electronically Signed   By: Ilona Sorrel M.D.   On: 03/14/2020 13:45   Result Date: 03/14/2020 CLINICAL DATA:  Abnormal x-ray findings. EXAM: CT CHEST WITH CONTRAST TECHNIQUE: Multidetector CT imaging of the chest was performed during intravenous contrast administration. CONTRAST:  28mL OMNIPAQUE IOHEXOL 300 MG/ML  SOLN COMPARISON:  March 12, 2020 FINDINGS: Cardiovascular: The pulmonary arteries are limited in evaluation secondary to suboptimal opacification with intravenous contrast. Normal heart size. A 3.6 cm x 2.4 cm area of low attenuation is seen within the anterior aspect of the right atrium. This extends from a large left suprahilar mass. Mild involvement of the left atrium is noted. No pericardial effusion. Mediastinum/Nodes: No enlarged mediastinal, hilar, or axillary lymph nodes. Thyroid gland, trachea, and esophagus demonstrate no significant findings. Lungs/Pleura: There is a 10.5 cm x 5.7 cm lobulated soft tissue mass seen along the left hilar and left suprahilar regions. There is no evidence of acute infiltrate, pleural effusion or pneumothorax. Upper Abdomen: There is evidence of prior stenting of the infrarenal abdominal aorta. Musculoskeletal: No chest wall abnormality. No acute or significant osseous findings. IMPRESSION: 1. 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 2. 3.6 cm x 2.4 cm area of low attenuation within the anterior aspect of the right atrium and adjacent portion of the left ventricle. While this is likely consistent with vascular invasion of the previously noted lung mass, a component of associated thrombus cannot be excluded. 3. Evidence of prior stenting of the infrarenal abdominal aorta. Electronically Signed: By: Virgina Norfolk M.D. On: 03/13/2020 19:05   CT ANGIO AO+BIFEM W & OR WO CONTRAST  Result Date: 03/06/2020 CLINICAL DATA:  74 year old  with sudden onset of right leg weakness. Abdominal aortic aneurysm repair 1 month ago. EXAM: CT ANGIOGRAPHY OF ABDOMINAL AORTA WITH ILIOFEMORAL RUNOFF TECHNIQUE: Multidetector CT imaging of the abdomen, pelvis and lower extremities was performed using the standard protocol during bolus administration of intravenous contrast. Multiplanar CT image reconstructions and MIPs were obtained to evaluate the vascular anatomy. CONTRAST:  171mL OMNIPAQUE IOHEXOL 350 MG/ML SOLN COMPARISON:  CTA 01/16/2020 and 01/14/2020 FINDINGS: VASCULAR Heart: There is concern for filling defects in the left atrium that could represent thrombus. This area is incompletely evaluated. This area of concern is seen on sequence 5, image 1. Aorta: Normal appearance of the distal descending thoracic aorta. Again noted is an aortic stent graft in the infrarenal aorta. Main body of the stent graft is patent but the right limb of the stent graft is occluded. Left limb of the graft remains patent. The abdominal aortic aneurysm sac measures 4.8 x 4.4 cm and previously measured 5.1 x 4.5 cm. No obvious endoleak on this arterial phase of imaging. No stranding or fluid around the aneurysm sac. Celiac: Celiac trunk and main branch vessels are patent. SMA: Mild atherosclerotic disease at the origin without significant stenosis. Main branches of the SMA are patent. Renals: Single bilateral renal arteries are patent. IMA: Origin of the IMA is occluded due to the aneurysm sac. Evidence for reconstitution beyond the origin. RIGHT Lower Extremity Inflow: Right limb of the aortic stent graft is occluded. This represents a new finding. Chronic stenosis at the origin of the right internal iliac artery. Right external iliac artery is patent. Outflow: Flow postsurgical changes in the right groin. The right common femoral artery has atherosclerotic disease but patent. Right profunda femoral artery is patent. Right SFA is patent with areas of at least mild narrowing.  Again noted is a short segment narrowing in the distal right SFA measuring close to 50% stenosis. These findings are unchanged. Right popliteal artery is patent. Again noted is focal narrowing in the right popliteal artery just above the knee joint and this may be related to a chronic dissection in this  area. Degree of stenosis appears to be greater than 50% at the popliteal artery dissection. Runoff: Primary runoff is the peroneal artery which is similar to the previous examination. Small amount of flow in the proximal anterior tibial artery and there may be decreased flow along the anterior tibial artery collateral distribution. This may be related to a small amount of thrombus or slow flow. Posterior tibial occludes in the mid calf and this is similar to the previous examination. Previously, there was distal reconstitution of the posterior tibial artery which is not clearly identified on this examination. LEFT Lower Extremity Inflow: Left aortic stent graft limb is patent. Left common and external iliac artery stents remain patent. Outflow: Postsurgical changes around the left common femoral artery. Left common femoral artery is patent. Left profunda femoral arteries are patent. Left SFA is patent with mild areas of narrowing. Stable appearance of the left SFA. Left popliteal artery is patent Runoff: Again noted is narrowing at the origin of the left anterior tibial artery. Left anterior tibial artery occludes in the proximal calf and there may be some areas of segmental reconstitution. Tibioperoneal trunk is patent. Peroneal artery is patent and there may be improved flow in the peroneal artery compared to the previous examination. Previously, there was a segment of the peroneal artery that was occluded. Posterior tibial artery occludes in the proximal calf. Evidence for segmental reconstitution of the posterior tibial artery. Veins: No obvious venous abnormality within the limitations of this arterial phase  study. Review of the MIP images confirms the above findings. NON-VASCULAR Lower chest: Lung bases are clear without pleural effusions. Hepatobiliary: Gallbladder is decompressed. Main portal veins are patent. Mild dilatation of the proximal extrahepatic bile duct measuring up to 1.3 cm and unchanged. Distal common bile duct measures roughly 0.5 cm and similar to the previous examination. Pancreas: Unremarkable. No pancreatic ductal dilatation or surrounding inflammatory changes. Spleen: Normal in size without focal abnormality. Adrenals/Urinary Tract: Normal appearance of the adrenal glands. Negative for hydronephrosis. Probable small renal cysts. Urinary bladder is unremarkable. Stomach/Bowel: Stomach is within normal limits. Appendix appears normal. No evidence of bowel wall thickening, distention, or inflammatory changes. Lymphatic: No significant lymph node enlargement in the abdomen or pelvis. Reproductive: Prostate is mildly prominent measuring 4.8 cm in transverse dimension. Other: Negative for ascites.  Negative for free air. Musculoskeletal: No acute bone abnormality. IMPRESSION: VASCULAR 1. Right limb of the bifurcated aortic stent graft is occluded. This is a new finding and consistent with patient's right leg symptoms. 2. Concern for filling defects in the left atrium. This could represent left atrial thrombus and source for embolic disease. 3. Right runoff has slightly changed since 01/14/2020. There is now single-vessel runoff from the peroneal artery. Previously, there was flow at the ankle from the posterior tibial artery. The lack of flow in the distal posterior tibial artery could be related to slow flow from the occluded stent limb but cannot exclude embolic disease involving the right runoff vessels. 4. No significant change in the right outflow disease. Right SFA and right popliteal artery remain patent. There continues to be focal narrowing in the right popliteal artery possibly related to a  dissection. 5. Left iliac artery stents remain patent. No significant change in the left outflow or runoff vessels. NON-VASCULAR 1. No acute abnormality in the abdomen or pelvis. These results were called by telephone at the time of interpretation on 03/06/2020 at 3:30 pm to provider Monica Martinez, MD, who verbally acknowledged these results. Electronically Signed  By: Markus Daft M.D.   On: 03/06/2020 16:13   CT CORONARY MORPH W/CTA COR W/SCORE W/CA W/CM &/OR WO/CM  Addendum Date: 03/13/2020   ADDENDUM REPORT: 03/13/2020 09:16 EXAM: OVER-READ INTERPRETATION  CT CHEST The following report is an over-read performed by radiologist Dr. Norlene Duel [facility] on 03/13/2020. This over-read does not include interpretation of cardiac or coronary anatomy or pathology. The coronary CTA and coronary calcium score interpretation by the cardiologist is attached. COMPARISON:  CTA 01/16/2020 FINDINGS: Vascular: Normal heart size.  No pericardial effusion identified. Mediastinum/nodes: Dilated and patulous esophagus. Lungs/pleura: There is a large, left upper lobe, perihilar lung mass which measures 8.9 x 5.5 cm, image 3/11. This mass encases the left hilum and occludes the left upper lobe and lingular bronchi with signs of postobstructive pneumonitis in the left upper lobe. The mass invades the left upper lobe pulmonary vein with tumor thrombus identified in the left atrium. The tumor thrombus protrudes through the mitral valve into the left ventricle. Upper abdomen: No acute abnormality. Musculoskeletal: No acute abnormality. IMPRESSION: 1. 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 This encases the left hilum and occludes the left upper lobe and lingular bronchi with signs of postobstructive pneumonitis in the left upper lobe. The mass invades the left upper lobe pulmonary vein with tumor thrombus in the left atrium, extending through the  mitral valve. Recommend further evaluation with PET-CT. Electronically Signed   By: Kerby Moors M.D.   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 EXAM: Cardiac/Coronary  CTA TECHNIQUE: The patient was scanned on a [employer]. FINDINGS: A 100 kV prospective scan was triggered in the descending thoracic aorta at 111 HU's. Axial non-contrast 3 mm slices were carried out through the heart. The data set was analyzed on a dedicated work station and scored using the [place]. Gantry rotation speed was 250 msecs and collimation was .6 mm. No beta blockade and 0.8 mg of sl NTG was given. The 3D data set was reconstructed in 5% intervals of the 67-82 % of the R-R cycle. Diastolic phases were analyzed on a dedicated work station using MPR, MIP and VRT modes. The patient received 80 cc of contrast. Aorta: Normal size. There is moderate to severe diffuse atherosclerotic plaque with mild calcifications. No dissection. Aortic Valve:  Trileaflet.  No calcifications. Coronary Arteries: Normal coronary origin. Right dominance. Coronary artery evaluation is affected by significant motion and poor vasodilation and therefore not interpretable. There are two stents in the proximal to mid RCA. 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 The thrombus is highly mobile with high risk of embolization. It is partially protruding into the left ventricle across the mitral valve and partially obstructing the flow to the left ventricle. The thrombus is only partially extending into the left atrial appendage. Dilated pulmonary artery suggestive of pulmonary hypertension. IMPRESSION: 1. Coronary Arteries: Normal coronary origin. Right dominance.  Coronary artery evaluation is affected by significant motion and poor vasodilation and therefore not interpretable. There are two stents in the proximal to mid RCA. 2. There is moderate to severe diffuse atherosclerotic plaque with mild calcifications. No dissection. 3.  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 The thrombus is highly mobile with high risk of embolization. It is partially protruding into the left ventricle across the mitral valve and partially obstructing the flow to the left ventricle. The thrombus is only partially extending into the left atrial appendage. 4. Dilated pulmonary artery suggestive of pulmonary hypertension. Electronically Signed: By: Ena Dawley On: 03/12/2020 18:57   DG C-Arm 1-60 Min  Result Date: 03/06/2020 CLINICAL DATA:  Angiogram EXAM: DG C-ARM 1-60 MIN FLUOROSCOPY TIME:  Fluoroscopy Time:  3 minutes and 28 seconds Radiation Exposure Index (if provided by the fluoroscopic device): 74.67 mGy Number of Acquired Spot Images: 6 COMPARISON:  March 06, 2020 CT FINDINGS: The patient has undergone abdominal aortic angiogram. An endograft is noted. The left limb of the graft appears to be patent. Several images demonstrate non patency of the right limb. IMPRESSION: The patient has undergone angiogram. Please see separate operative report for complete intraoperative details. Electronically Signed   By: Constance Holster M.D.   On: 03/06/2020 19:50   ECHO INTRAOPERATIVE TEE  Result Date: 03/11/2020  *INTRAOPERATIVE TRANSESOPHAGEAL REPORT *  Patient Name:   ROYE GUSTAFSON  Date of Exam: 03/11/2020 Medical Rec #:  [id number]      Height:       75.0 in Accession #:    [id number]     Weight:       194.0 lb Date of Birth:  01-22-1946       BSA:          2.17 m Patient Age:    24 years       BP:           137/74 mmHg Patient Gender: M              HR:            75 bpm. Exam Location:  Anesthesiology Transesophogeal exam was perform intraoperatively during surgical procedure. Patient was closely monitored under general anesthesia during the entirety of examination. Indications:     left atrial thrombus Sonographer:     Jannett Celestine RDCS (AE) Performing Phys: Annye Asa MD Diagnosing Phys: Annye Asa MD Report CC'd to:  Servando Snare MD Complications: No known complications during this procedure. PRE-OP FINDINGS  Left Ventricle: The left ventricle has low normal systolic function, with an ejection fraction of 50-55% 52%. The cavity size was normal. There is no increase in left ventricular wall thickness. No evidence of left ventricular regional wall motion abnormalities. Right Ventricle: The right ventricle has normal systolic function. The cavity was normal. There is no increase in right ventricular wall thickness. Left Atrium: Left atrial size was mildly dilated. There is a very large, lobulated, mobile thrombus present in the L atrium. The atrial portion measures 2.58 x3.04 cm, however, the mass appears to extend well into the L superior pulmonary vein. It most likely obstructs flow from the L superior pulmonary vein. The thrombus does traverse the mitral apparatus during diastole, but it does not seem to affect forward flow. The left atrial appendage is well visualized and there is no evidence of thrombus present in it. Left atrial appendage velocity is normal at greater than 40 cm/s. Right Atrium: Right atrial size was normal in size. Right atrial pressure is estimated at 10 mmHg. Interatrial Septum: No atrial level shunt detected by color  flow Doppler. Pericardium: There is no evidence of pericardial effusion. Mitral Valve: The mitral valve is normal in structure. No thickening of the mitral valve leaflet. No calcification of the mitral valve leaflet. Mitral valve regurgitation is not visualized by color flow Doppler. There is no evidence of mitral  valve vegetation. There is no evidence of mitral stenosis. Tricuspid Valve: The tricuspid valve was normal in structure. Tricuspid valve regurgitation is trivial by color flow Doppler. There is no evidence of tricuspid valve vegetation. Aortic Valve: The aortic valve is tricuspid Aortic valve regurgitation was not visualized by color flow Doppler. There is no evidence of aortic valve stenosis. There is no evidence of a vegetation on the aortic valve. Pulmonic Valve: The pulmonic valve was normal in structure, with normal leaflet excursion. No evidence of pulmonic stenosis. Pulmonic valve regurgitation is trivial by color flow Doppler. Aorta: The aortic root, ascending aorta and aortic arch are normal in size and structure. There is evidence of scattered layered plaque in the descending aorta; Grade I, measuring 1-19mm in size. Pulmonary Artery: The pulmonary artery is of normal size. Venous: The inferior vena cava was not well visualized, but appears normal in size. +--------------+-------++ LEFT VENTRICLE        +--------------+-------++ PLAX 2D               +--------------+-------++ LVIDd:        4.13 cm +--------------+-------++ LVIDs:        3.03 cm +--------------+-------++ LV SV:        40 ml   +--------------+-------++ LV SV Index:  18.36   +--------------+-------++                       +--------------+-------++ +-------------+-----------++ AORTIC VALVE             +-------------+-----------++ AV Vmax:     143.00 cm/s +-------------+-----------++ AV Vmean:    94.100 cm/s +-------------+-----------++ AV VTI:      0.208 m     +-------------+-----------++ AV Peak Grad:8.2 mmHg    +-------------+-----------++ AV Mean Grad:4.0 mmHg    +-------------+-----------++ +-------------+---------++ MITRAL VALVE           +-------------+---------++ MV Peak grad:5.7 mmHg  +-------------+---------++ MV Mean grad:2.0 mmHg  +-------------+---------++ MV Vmax:      1.19 m/s  +-------------+---------++ MV Vmean:    68.8 cm/s +-------------+---------++ MV VTI:      0.32 m    +-------------+---------++  Annye Asa MD Electronically signed by Annye Asa MD Signature Date/Time: 03/11/2020/2:09:20 PM    Final    ECHO INTRAOPERATIVE TEE  Result Date: 03/07/2020  *INTRAOPERATIVE TRANSESOPHAGEAL REPORT *  Patient Name:   Demitrios Bambach Date of Exam: 03/06/2020 Medical Rec #:  [id number]     Height:       75.0 in Accession #:    [id number]    Weight:       174.2 lb Date of Birth:  1946/03/29      BSA:          2.07 m Patient Age:    49 years      BP:           113/72 mmHg Patient Gender: M             HR:           83 bpm. Exam Location:  Inpatient Transesophogeal exam was perform intraoperatively during surgical procedure. Patient was closely monitored under general anesthesia during the entirety of examination. Indications:  Left atrial thrombus Performing Phys: [id number] Gwenyth Allegra CLARK Diagnosing Phys: Albertha Ghee MD Complications: No known complications during this procedure. POST-OP IMPRESSIONS Overall, there were no significant changes from pre-bypass. PRE-OP FINDINGS  Left Ventricle: The left ventricle has normal systolic function, with an ejection fraction of 55-60%. The cavity size was normal. There is mild concentric left ventricular hypertrophy. No evidence of left ventricular regional wall motion abnormalities. Right Ventricle: The right ventricle has normal systolic function. The cavity was normal. There is no increase in right ventricular wall thickness. Left Atrium: Left atrial size was normal in size. There is a large, mobile, lobulated mass in the LA. The echogenic character is largely homogenous. The edges are smooth and rounded. This is likely a thrombus. It measures approximately 10cm x 5cm. It appears anchored deep within the LUPV and the remaining portion is freely mobile. The most distal portion of the mass extends through the mitral  valve during diastole. No significant inflow obstruction is seen. The left atrial appendage is well visualized and there is no evidence of thrombus present. Right Atrium: Right atrial size was normal in size. Right atrial pressure is estimated at 10 mmHg. Interatrial Septum: No atrial level shunt detected by color flow Doppler. Pericardium: There is no evidence of pericardial effusion. Mitral Valve: The mitral valve is normal in structure. Mitral valve regurgitation is trivial by color flow Doppler. Tricuspid Valve: The tricuspid valve was normal in structure. Tricuspid valve regurgitation is trivial by color flow Doppler. Aortic Valve: The aortic valve is normal in structure. Aortic valve regurgitation was not visualized by color flow Doppler. There is no evidence of aortic valve stenosis. Pulmonic Valve: The pulmonic valve was normal in structure, with normal. Pulmonic valve regurgitation is not visualized by color flow Doppler. Aorta: The ascending aorta and aortic root are normal in size and structure. There is evidence of atheroma immobile plaque in the descending aorta and aortic arch; Grade IV, measuring >37mm in size.  Albertha Ghee MD Electronically signed by Albertha Ghee MD Signature Date/Time: 03/07/2020/9:21:44 AM    Final    VAS US CAROTID  Result Date: 03/12/2020 Carotid Arterial Duplex Study Indications:       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 Risk Factors:      Hypertension, Diabetes, coronary artery disease. Comparison Study:  No prior studies. Performing Technologist: Darlin Coco  Examination Guidelines: A complete evaluation includes B-mode imaging, spectral Doppler, color Doppler, and power Doppler as needed of all accessible portions of each vessel. Bilateral testing is considered an integral part of a complete examination. Limited examinations for reoccurring indications may be performed as noted.  Right Carotid Findings:  +----------+--------+--------+--------+-----------------------+--------+           PSV cm/sEDV cm/sStenosisPlaque Description     Comments +----------+--------+--------+--------+-----------------------+--------+ CCA Prox  63      12              heterogenous and smooth         +----------+--------+--------+--------+-----------------------+--------+ CCA Mid   100     21              heterogenous and smooth         +----------+--------+--------+--------+-----------------------+--------+ CCA Distal80      13              heterogenous and smooth         +----------+--------+--------+--------+-----------------------+--------+ ICA Prox  47      13      1-39%   heterogenous                    +----------+--------+--------+--------+-----------------------+--------+  ICA Distal52      20                                              +----------+--------+--------+--------+-----------------------+--------+ ECA       51      9                                               +----------+--------+--------+--------+-----------------------+--------+ +----------+--------+-------+----------------+-------------------+           PSV cm/sEDV cmsDescribe        Arm Pressure (mmHG) +----------+--------+-------+----------------+-------------------+ Subclavian               Multiphasic, WNL                    +----------+--------+-------+----------------+-------------------+ +---------+--------+--+--------+--+---------+ VertebralPSV cm/s73EDV cm/s22Antegrade +---------+--------+--+--------+--+---------+  Left Carotid Findings: +----------+--------+--------+--------+-----------------------+--------+           PSV cm/sEDV cm/sStenosisPlaque Description     Comments +----------+--------+--------+--------+-----------------------+--------+ CCA Prox  97      20              heterogenous                     +----------+--------+--------+--------+-----------------------+--------+ CCA Mid   93      19              heterogenous and smooth         +----------+--------+--------+--------+-----------------------+--------+ CCA Distal104     25              heterogenous and smooth         +----------+--------+--------+--------+-----------------------+--------+ ICA Prox  59      10      1-39%   heterogenous                    +----------+--------+--------+--------+-----------------------+--------+ ICA Distal92      34                                              +----------+--------+--------+--------+-----------------------+--------+ ECA       97      13                                              +----------+--------+--------+--------+-----------------------+--------+ +----------+--------+--------+----------------+-------------------+           PSV cm/sEDV cm/sDescribe        Arm Pressure (mmHG) +----------+--------+--------+----------------+-------------------+ Subclavian110             Multiphasic, WNL                    +----------+--------+--------+----------------+-------------------+ +---------+--------+--+--------+--+---------+ VertebralPSV cm/s93EDV cm/s29Antegrade +---------+--------+--+--------+--+---------+   Summary: Right Carotid: Velocities in the right ICA are consistent with a 1-39% stenosis.                Non-hemodynamically significant plaque <50% noted in the CCA. Left Carotid: Velocities in the left ICA are consistent with a 1-39% stenosis.  Non-hemodynamically significant plaque <50% noted in the CCA.  *See table(s) above for measurements and observations.  Electronically signed by Ruta Hinds MD on 03/12/2020 at 6:14:00 PM.    Final      TODAY-DAY OF DISCHARGE:  Subjective:   [name] today has no headache,no chest abdominal pain,no new weakness tingling or numbness, feels much better wants to go home today.   Objective:    Blood pressure (!) 138/78, pulse 66, temperature 97.6 F (36.4 C), temperature source Oral, resp. rate 20, height 6\' 3"  (1.905 m), weight 85.7 kg, SpO2 98 %.  Intake/Output Summary (Last 24 hours) at 03/19/2020 0956 Last data filed at 03/19/2020 0600 Gross per 24 hour  Intake 540 ml  Output 800 ml  Net -260 ml   Filed Weights   03/17/20 0500 03/18/20 0500 03/19/20 0500  Weight: 89.7 kg 85.8 kg 85.7 kg    Exam: Awake Alert, Oriented *3, No new F.N deficits, Normal affect Burna.AT,PERRAL Supple Neck,No JVD, No cervical lymphadenopathy appriciated.  Symmetrical Chest wall movement, Good air movement bilaterally, CTAB RRR,No Gallops,Rubs or new Murmurs, No Parasternal Heave +ve B.Sounds, Abd Soft, Non tender, No organomegaly appriciated, No rebound -guarding or rigidity. No Cyanosis, Clubbing or edema, No new Rash or bruise   PERTINENT RADIOLOGIC STUDIES: No results found.   PERTINENT LAB RESULTS: CBC: Recent Labs    03/17/20 0152 03/19/20 0119  WBC 9.3 10.2  HGB 9.4* 9.5*  HCT 30.2* 29.2*  PLT 149* 128*   CMET CMP     Component Value Date/Time   NA 135 03/17/2020 0152   K 3.9 03/17/2020 0152   CL 102 03/17/2020 0152   CO2 24 03/17/2020 0152   GLUCOSE 133 (H) 03/17/2020 0152   BUN 11 03/17/2020 0152   CREATININE 1.08 03/17/2020 0152   CALCIUM 9.3 03/17/2020 0152   PROT 5.9 (L) 03/11/2020 0318   ALBUMIN 2.9 (L) 03/11/2020 0318   AST 38 03/11/2020 0318   ALT 22 03/11/2020 0318   ALKPHOS 50 03/11/2020 0318   BILITOT 1.8 (H) 03/11/2020 0318   GFRNONAA >60 03/17/2020 0152   GFRAA >60 03/17/2020 0152    GFR Estimated Creatinine Clearance: 72.8 mL/min (by C-G formula based on SCr of 1.08 mg/dL). No results for input(s): LIPASE, AMYLASE in the last 72 hours. No results for input(s): CKTOTAL, CKMB, CKMBINDEX, TROPONINI in the last 72 hours. Invalid input(s): POCBNP No results for input(s): DDIMER in the last 72 hours. No results for input(s): HGBA1C in the last  72 hours. No results for input(s): CHOL, HDL, LDLCALC, TRIG, CHOLHDL, LDLDIRECT in the last 72 hours. No results for input(s): TSH, T4TOTAL, T3FREE, THYROIDAB in the last 72 hours.  Invalid input(s): FREET3 No results for input(s): VITAMINB12, FOLATE, FERRITIN, TIBC, IRON, RETICCTPCT in the last 72 hours. Coags: No results for input(s): INR in the last 72 hours.  Invalid input(s): PT Microbiology: Recent Results (from the past 240 hour(s))  SARS Coronavirus 2 by RT PCR (hospital order, performed in [facility] hospital lab) Nasopharyngeal     Status: None   Collection Time: 03/18/20  9:05 AM   Specimen: Nasopharyngeal  Result Value Ref Range Status   SARS Coronavirus 2 NEGATIVE NEGATIVE Final    Comment: (NOTE) SARS-CoV-2 target nucleic acids are NOT DETECTED.  The SARS-CoV-2 RNA is generally detectable in upper and lower respiratory specimens during the acute phase of infection. The lowest concentration of SARS-CoV-2 viral copies this assay can detect is 250 copies / mL. A negative result does  not preclude SARS-CoV-2 infection and should not be used as the sole basis for treatment or other patient management decisions.  A negative result may occur with improper specimen collection / handling, submission of specimen other than nasopharyngeal swab, presence of viral mutation(s) within the areas targeted by this assay, and inadequate number of viral copies (<250 copies / mL). A negative result must be combined with clinical observations, patient history, and epidemiological information.  Fact Sheet for Patients:   StrictlyIdeas.no  Fact Sheet for Healthcare Providers: BankingDealers.co.za  This test is not yet approved or  cleared by the [place] FDA and has been authorized for detection and/or diagnosis of SARS-CoV-2 by FDA under an Emergency Use Authorization (EUA).  This EUA will remain in effect (meaning this test can be  used) for the duration of the COVID-19 declaration under Section 564(b)(1) of the Act, 21 U.S.C. section 360bbb-3(b)(1), unless the authorization is terminated or revoked sooner.  Performed at [facility], [place] [address] [place], [place] [address]     FURTHER DISCHARGE INSTRUCTIONS:  Get Medicines reviewed and adjusted: Please take all your medications with you for your next visit with your Primary MD  Laboratory/radiological data: Please request your Primary MD to go over all hospital tests and procedure/radiological results at the follow up, please ask your Primary MD to get all Hospital records sent to his/her office.  In some cases, they will be blood work, cultures and biopsy results pending at the time of your discharge. Please request that your primary care M.D. goes through all the records of your hospital data and follows up on these results.  Also Note the following: If you experience worsening of your admission symptoms, develop shortness of breath, life threatening emergency, suicidal or homicidal thoughts you must seek medical attention immediately by calling 911 or calling your MD immediately  if symptoms less severe.  You must read complete instructions/literature along with all the possible adverse reactions/side effects for all the Medicines you take and that have been prescribed to you. Take any new Medicines after you have completely understood and accpet all the possible adverse reactions/side effects.   Do not drive when taking Pain medications or sleeping medications (Benzodaizepines)  Do not take more than prescribed Pain, Sleep and Anxiety Medications. It is not advisable to combine anxiety,sleep and pain medications without talking with your primary care practitioner  Special Instructions: If you have smoked or chewed Tobacco  in the last 2 yrs please stop smoking, stop any regular Alcohol  and or any Recreational drug use.  Wear Seat belts while  driving.  Please note: You were cared for by a hospitalist during your hospital stay. Once you are discharged, your primary care physician will handle any further medical issues. Please note that NO REFILLS for any discharge medications will be authorized once you are discharged, as it is imperative that you return to your primary care physician (or establish a relationship with a primary care physician if you do not have one) for your post hospital discharge needs so that they can reassess your need for medications and monitor your lab values.  Total Time spent coordinating discharge including counseling, education and face to face time equals  45 minutes.  SignedOren Binet 03/19/2020 9:56 AM

## 2020-03-19 NOTE — Progress Notes (Signed)
Patient via [facility] to [facility], report was called to [place].  Additional copy of discharge instructions printed for wife.  All necessary paperwork for [facility] and [facility] in discharge envelope.

## 2020-03-19 NOTE — Progress Notes (Signed)
[facility]  Subjective  - No new complaints regarding LEs.   Objective (!) 138/78 66 97.6 F (36.4 C) (Oral) 20 100%  Intake/Output Summary (Last 24 hours) at 03/19/2020 0734 Last data filed at 03/19/2020 0600 Gross per 24 hour  Intake 780 ml  Output 800 ml  Net -20 ml    Right LE incisions healing well, red base medial incision dressing replaced. Doppler DP/PT B signals, motor and sensation intact.  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 Patent EVAR and B LE with doppler signals intact.  Encouraged ambulation as tolerates. 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 Per CT surgery, not a surgical candidate.   Roxy Horseman 03/19/2020 7:34 AM --  Laboratory Lab Results: Recent Labs    03/17/20 0152 03/19/20 0119  WBC 9.3 10.2  HGB 9.4* 9.5*  HCT 30.2* 29.2*  PLT 149* 128*   BMET Recent Labs    03/17/20 0152  NA 135  K 3.9  CL 102  CO2 24  GLUCOSE 133*  BUN 11  CREATININE 1.08  CALCIUM 9.3    COAG Lab Results  Component Value Date   INR 1.3 (H) 03/06/2020   INR 1.1 01/15/2020   INR 1.0 01/14/2020   No results found for: PTT

## 2020-03-20 ENCOUNTER — Encounter: Payer: Self-pay | Admitting: Internal Medicine

## 2020-03-20 ENCOUNTER — Non-Acute Institutional Stay (SKILLED_NURSING_FACILITY): Payer: Medicare PPO | Admitting: Internal Medicine

## 2020-03-20 DIAGNOSIS — E1365 Other specified diabetes mellitus with hyperglycemia: Secondary | ICD-10-CM

## 2020-03-20 DIAGNOSIS — IMO0002 Reserved for concepts with insufficient information to code with codable children: Secondary | ICD-10-CM

## 2020-03-20 DIAGNOSIS — L89303 Pressure ulcer of unspecified buttock, stage 3: Secondary | ICD-10-CM | POA: Diagnosis not present

## 2020-03-20 DIAGNOSIS — E1351 Other specified diabetes mellitus with diabetic peripheral angiopathy without gangrene: Secondary | ICD-10-CM | POA: Diagnosis not present

## 2020-03-20 DIAGNOSIS — I480 Paroxysmal atrial fibrillation: Secondary | ICD-10-CM

## 2020-03-20 DIAGNOSIS — T82898D Other specified complication of vascular prosthetic devices, implants and grafts, subsequent encounter: Secondary | ICD-10-CM | POA: Diagnosis not present

## 2020-03-20 DIAGNOSIS — C3402 Malignant neoplasm of left main bronchus: Secondary | ICD-10-CM | POA: Diagnosis not present

## 2020-03-20 DIAGNOSIS — C3492 Malignant neoplasm of unspecified part of left bronchus or lung: Secondary | ICD-10-CM | POA: Insufficient documentation

## 2020-03-20 NOTE — Progress Notes (Signed)
NURSING HOME LOCATION:  [facility] ROOM NUMBER:  [id number]  CODE STATUS:  FULL CODE  PCP:  [place]  [facility]) [place] [id number]  This is a comprehensive admission note to [facility] performed on this date less than 30 days from date of admission. Included are preadmission medical/surgical history; reconciled medication list; family history; social history and comprehensive review of systems.  Corrections and additions to the records were documented. Comprehensive physical exam was also performed. Additionally a clinical summary was entered for each active diagnosis pertinent to this admission in the Problem List to enhance continuity of care.  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  He underwent emergent thrombectomy and right lower extremity 4 compartment fasciotomy.  Further evaluation with intraoperative TEE revealed a mobile left atrial thrombus.  Subsequent CT imaging revealed a large left lung mass eroding into the mediastinum into the pulmonary vein and into the left atrium extending to the mitral valve.  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  It was learned that in 2019 the patient was diagnosed with squamous cell carcinoma of the lung at the [facility] for which he refused treatment. Pathology on the thrombus removed 7/15 revealed poorly demonstrated malignancy with extensive necrosis.  Oncology consulted; he was not deemed to be an operative candidate for possible sternotomy and clot removal.  IV bivalirudin-HIT antibody/SRA negative was switched to Lovenox long-term as patient had failed Eliquis.  On 7/20 right leg fasciotomy was closed. Hospital course was complicated by a run of A. fib with RVR. Thrombocytopenia improved with bivalirudin HIT antibody/ negative SRA negative. Pressure injury of  the buttocks.  Stage III was documented 7/21.  There was full-thickness tissue loss with visible subcutaneous fat but without visible bone, tendon, muscle.  Past medical and surgical history: Includes diabetes complicated by PVD, essential hypertension, history of renal calculi, CAD, and history of asthma. He has had multiple vascular interventions and procedures including AAA repair in May of this year with endovascular stent graft, status post left iliac thrombectomy and left external iliac stent.  PCI to the RCA was completed in 2006.  Social history: Nondrinker, former smoker.  He is a [place] vet.  He was also stationed in [place] as well as [place].  Family history: Reviewed.   Review of systems: His main complaint is profound fatigue.  He also continues to have pain in the left lower extremity.  His wife states that PTA he was being treated for diffuse arthritis for which he was taking Baclofen and arthritis strength Tylenol.   He expresses the desire to stop taking narcotics as these make him hallucinate.  His wife stated that when he awoke he was irritable and had difficulty telling the staff his name. He does have chronic low back pain with sciatica.  He also has intermittent constipation.  His wife states that he complains of being cold which she attributes to the blood thinner.  He states he actually feels like he might shiver with this. He also has sweats.  He does have COPD and produces phlegm intermittently.  He has some residual soreness at the incisions over the lower abdomen. Prior to hospitalization glucoses ran 125-130.  In the hospital he had glucoses as low as 70.  Constitutional: No fever  Eyes: No redness, discharge, pain, vision change ENT/mouth: No nasal congestion, purulent discharge, earache, change in hearing, sore  throat  Cardiovascular: No chest pain, palpitations, paroxysmal nocturnal dyspnea, claudication, edema  Respiratory: No  hemoptysis, DOE, significant snoring,  apnea Gastrointestinal: No heartburn, dysphagia,  nausea /vomiting, rectal bleeding, melena Genitourinary: No dysuria, hematuria, pyuria, incontinence, nocturia Dermatologic: No rash, pruritus, change in appearance of skin Neurologic: No dizziness, headache, syncope, seizures, numbness, tingling Endocrine: No change in hair/skin/nails, excessive thirst, excessive hunger, excessive urination  Hematologic/lymphatic: No significant bruising, lymphadenopathy, abnormal bleeding Allergy/immunology: No itchy/watery eyes, significant sneezing, urticaria, angioedema  Physical exam:  Pertinent or positive findings: Despite the cancer diagnosis he appears adequately nourished and younger than his stated age.  Pattern alopecia is present.  He has a mustache and beard.  There are contractures of the upper or superior ears.  S4 gallop is present.  He has rhonchi greater on the left than the right with expiratory rubs on the left.  Bowel sounds are decreased.  Clubbing of the nailbeds are noted.  General appearance:  no acute distress, increased work of breathing is present.   Lymphatic: No lymphadenopathy about the head, neck, axilla. Eyes: No conjunctival inflammation or lid edema is present. There is no scleral icterus. Ears:  External ear exam shows no significant lesions or deformities.   Nose:  External nasal examination shows no deformity or inflammation. Nasal mucosa are pink and moist without lesions, exudates Oral exam: Lips and gums are healthy appearing.There is no oropharyngeal erythema or exudate. Neck:  No thyromegaly, masses, tenderness noted.    Heart:  No murmur, click, rub.  Lungs:  without wheezes, rales. Abdomen: Bowel sounds are normal.  Abdomen is soft and nontender with no organomegaly, hernias, masses. GU: Deferred  Extremities:  No cyanosis,  edema. Neurologic exam:  Balance, Rhomberg, finger to nose testing could not be completed due to clinical state Skin: Warm & dry w/o  tenting. No significant lesions or rash.  See clinical summary under each active problem in the Problem List with associated updated therapeutic plan

## 2020-03-20 NOTE — Assessment & Plan Note (Addendum)
Wound Care Nurse to monitor at [facility]

## 2020-03-20 NOTE — Assessment & Plan Note (Addendum)
Rhythm is regular; slight gallop cadence suggested. Lovenox continued at [facility]

## 2020-03-20 NOTE — Patient Instructions (Signed)
See assessment and plan under each diagnosis in the problem list and acutely for this visit 

## 2020-03-20 NOTE — Assessment & Plan Note (Signed)
Oncology follow-up following discharge from the SNF.  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

## 2020-03-20 NOTE — Assessment & Plan Note (Signed)
Vascular surgery follow-up as indicated.  Continue Lovenox.

## 2020-03-20 NOTE — Assessment & Plan Note (Addendum)
Glucoses to date at [facility] revealed afternoon glucoses of 123-271 Intensive SSI is not appropriate in an SNF as per medical literature. He will be switched to basal insulin with adjustments in premeal glucoses as indicated.

## 2020-03-21 ENCOUNTER — Telehealth: Payer: Self-pay

## 2020-03-21 LAB — SEROTONIN RELEASE ASSAY (SRA)
SRA .2 IU/mL UFH Ser-aCnc: 54 % — ABNORMAL HIGH (ref 0–20)
SRA 100IU/mL UFH Ser-aCnc: 24 % — ABNORMAL HIGH (ref 0–20)

## 2020-03-21 SURGERY — EXCISION, MYXOMA, CARDIAC ATRIUM
Anesthesia: General

## 2020-03-21 NOTE — Telephone Encounter (Signed)
Spoke to [place] at [facility]. She called to let us know pt's R LE wound has some dehiscence with a little eschar on edge and his lateral wound has some slough. Not warm, no purulent drainage, no erythema. They are treating it daily with xeroform, abd's and kerlix. He has f/u 8/20.  She will call us back if anything changes/worsens and we can get him in sooner. PA is in agreement with this plan. Mechele Claude is aware and pt's wife called as well and has been made aware.

## 2020-03-24 ENCOUNTER — Encounter: Payer: Self-pay | Admitting: Adult Health

## 2020-03-24 ENCOUNTER — Non-Acute Institutional Stay (SKILLED_NURSING_FACILITY): Payer: Medicare PPO | Admitting: Adult Health

## 2020-03-24 DIAGNOSIS — J449 Chronic obstructive pulmonary disease, unspecified: Secondary | ICD-10-CM

## 2020-03-24 DIAGNOSIS — E1365 Other specified diabetes mellitus with hyperglycemia: Secondary | ICD-10-CM | POA: Diagnosis not present

## 2020-03-24 DIAGNOSIS — D696 Thrombocytopenia, unspecified: Secondary | ICD-10-CM

## 2020-03-24 DIAGNOSIS — N400 Enlarged prostate without lower urinary tract symptoms: Secondary | ICD-10-CM

## 2020-03-24 DIAGNOSIS — C3402 Malignant neoplasm of left main bronchus: Secondary | ICD-10-CM | POA: Diagnosis not present

## 2020-03-24 DIAGNOSIS — I480 Paroxysmal atrial fibrillation: Secondary | ICD-10-CM

## 2020-03-24 DIAGNOSIS — E1351 Other specified diabetes mellitus with diabetic peripheral angiopathy without gangrene: Secondary | ICD-10-CM | POA: Diagnosis not present

## 2020-03-24 DIAGNOSIS — IMO0002 Reserved for concepts with insufficient information to code with codable children: Secondary | ICD-10-CM

## 2020-03-24 DIAGNOSIS — Z7189 Other specified counseling: Secondary | ICD-10-CM

## 2020-03-24 DIAGNOSIS — T82898D Other specified complication of vascular prosthetic devices, implants and grafts, subsequent encounter: Secondary | ICD-10-CM | POA: Diagnosis not present

## 2020-03-24 NOTE — Progress Notes (Deleted)
Location:  [facility] Room Number: 216-A Place of Service:  SNF (31) Provider:  Durenda Age, DNP, FNP-BC  Patient Care Team: [place] as PCP - General ([place]) [name], MD as PCP - Cardiology (Cardiology) Patient, No Pcp Per (General Practice)  Extended Emergency Contact Information Primary Emergency Contact: Cupples,Antionette Address: [address]          [place], [place] [address] [place] Phone: [phone] Mobile Phone: [phone] Relation: Spouse  Code Status:  FULL CODE  Goals of care: Advanced Directive information Advanced Directives 03/07/2020  Does Patient Have a Medical Advance Directive? No  Would patient like information on creating a medical advance directive? No - Patient declined     Chief Complaint  Patient presents with  . advance care plan meeting    Discussion of medications and care plan    HPI:  Pt is a 74 y.o. male who had a care plan meeting attended by resident, social worker, MDS coordinator, NP, treatment nurse, administrator, OT, PT and wife, who attended via teleconference.  He remains to be full code.  Discussed medications, vital signs and weights.  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 There was a concern for confusion. Speech therapy requested to perform SLUMS to evaluate cognition. He was admitted to [facility] on 03/19/20 post hospitalization 03/06/20 to 03/19/20. He has PMH history of AAA S/P repair in 01/10/20 with endovascular stent graft S/P left iliac thrombectomy and left external iliac stent, PVD, hypertension, type 2 diabetes mellitus, CAD and atrial fibrillation. He presented to the hospital with right ischemic leg and was found to have occluded right/left limb of the aorto-biiliac endograft and now S/P emergent thrombectomy and RLE 4 compartment fasciotomy. Further evaluation with intraoperative TEE showed a  mobile left atrial thrombus. Subsequent CT imaging study showed a large left lung mass-eroding    He is a short-term rehabilitation resident of [facility]. He has a PMH of diabetes complicated by PVD, essential HTN, history of renal calculi, CAD, and history of asthma.   Past Medical History:  Diagnosis Date  . Asthma   . Coronary artery disease   . Diabetes mellitus without complication ([facility])   . History of kidney stones   . Hypertension   . Peripheral vascular disease [facility])    Past Surgical History:  Procedure Laterality Date  . AORTOGRAM  03/06/2020   Procedure: AORTOGRAM WITH BILATERAL ILIAC ARTERIOGRAM;  Surgeon: Marty Heck, MD;  Location: [facility];  Service: Vascular;;  . CARDIAC CATHETERIZATION N/A 07/05/2016   Procedure: Left Heart Cath and Coronary Angiography;  Surgeon: Sherren Mocha, MD;  Location: [facility] CV LAB;  Service: Cardiovascular;  Laterality: N/A;  . CORONARY ANGIOPLASTY    . EMBOLECTOMY Bilateral 03/06/2020   Procedure: THROMBECTOMY OF RIGHT LIMB OF BIFURCATED AORTO-BI-ILIAC STENT GRAFT; RIGHT LOWER EXTREMITY THROMBECTOMY USING THE FEMORAL APPROACH; REDO EXPOSURE LEFT COMMON FEMORAL ARTERY; THROMBECTOMY LEFT LIMB OF AORTO-BI-ILIAC STENT GRAFT;  LEFT LOWER EXTREMITY THROMBECTOMY USING THE FEMORAL APPROACH;  Surgeon: Marty Heck, MD;  Location: [facility];  Service: Vascular;  Laterality: Bilateral;  . ENDOVASCULAR STENT INSERTION N/A 01/15/2020   Procedure: ENDOVASCULAR ANEURYSM REPAIR;  Surgeon: Waynetta Sandy, MD;  Location: [facility];  Service: Vascular;  Laterality: N/A;  . FASCIOTOMY Right 03/06/2020   Procedure: RIGHT LOWER EXTREMITY FOUR COMPARTMENT FASCIOTOMIES;  Surgeon: Marty Heck, MD;  Location: [facility];  Service: Vascular;  Laterality: Right;  . FASCIOTOMY CLOSURE Right 03/11/2020   Procedure: RIGHT LOWER EXTREMITY FASCIOTOMY WASHOUT AND CLOSURE;  Surgeon: Waynetta Sandy, MD;  Location: [facility];   Service: Vascular;  Laterality: Right;  . heart stents  2006  . INSERTION OF ILIAC STENT Left 01/15/2020   Procedure: INSERTION OF LEFT EXTERNAL ILIAC ARTERY STENT;  Surgeon: Waynetta Sandy, MD;  Location: [facility];  Service: Vascular;  Laterality: Left;  . LOWER EXTREMITY ANGIOGRAM  01/15/2020   Procedure: Left Lower Extremity Angiogram;  Surgeon: Waynetta Sandy, MD;  Location: [facility];  Service: Vascular;;  . TEE WITHOUT CARDIOVERSION  03/11/2020   Procedure: TRANSESOPHAGEAL ECHOCARDIOGRAM (TEE);  Surgeon: Waynetta Sandy, MD;  Location: [facility];  Service: Vascular;;  . THROMBECTOMY ILIAC ARTERY  01/15/2020   Procedure: THROMBECTOMY OF LEFT ILIAC ARTERY;  Surgeon: Waynetta Sandy, MD;  Location: [facility];  Service: Vascular;;    Allergies  Allergen Reactions  . Erythromycin Shortness Of Breath and Swelling  . Simvastatin Other (See Comments)    CAUSE MUSCLE PAIN,.  . Statins Other (See Comments)    Muscle pain    Outpatient Encounter Medications as of 03/24/2020  Medication Sig  . acetaminophen (TYLENOL) 325 MG tablet Take 2 tablets (650 mg total) by mouth every 6 (six) hours as needed for mild pain (or temp >/= 101 F).  Marland Kitchen albuterol (PROVENTIL HFA;VENTOLIN HFA) 108 (90 BASE) MCG/ACT inhaler Inhale 2 puffs into the lungs every 6 (six) hours as needed for wheezing or shortness of breath.   . Calcium Carb-Cholecalciferol ([employer] CALCIUM 600 & VITAMIN D3) 600-800 MG-UNIT TABS Take 1 tablet by mouth daily.  . Calcium Carbonate-Simethicone (ALKA-SELTZER HEARTBURN + GAS PO) Take 2 each by mouth every 4 (four) hours as needed.  . clopidogrel (PLAVIX) 75 MG tablet Take 75 mg by mouth daily.  . Cyanocobalamin (VITAMIN B 12 PO) Take 500 mcg by mouth daily.   . diclofenac sodium (VOLTAREN) 1 % GEL Apply 4 g topically 3 (three) times daily as needed (pain).   Marland Kitchen enoxaparin (LOVENOX) 80 MG/0.8ML injection Inject 0.8 mLs (80 mg total) into the skin every 12 (twelve) hours.  .  finasteride (PROSCAR) 5 MG tablet Take 5 mg by mouth daily.  . hydroxypropyl methylcellulose / hypromellose (ISOPTO TEARS / GONIOVISC) 2.5 % ophthalmic solution Place 1-2 drops into both eyes in the morning and at bedtime.   . insulin glargine (LANTUS SOLOSTAR) 100 UNIT/ML Solostar Pen Inject 12 Units into the skin daily.  Marland Kitchen loratadine (CLARITIN) 10 MG tablet Take 10 mg by mouth daily.  . metFORMIN (GLUCOPHAGE-XR) 500 MG 24 hr tablet Take 1,000 mg by mouth in the morning and at bedtime.   . nitroGLYCERIN (NITROSTAT) 0.4 MG SL tablet Place 0.4 mg under the tongue every 5 (five) minutes as needed for chest pain.  . Nutritional Supplement LIQD Take 120 mLs by mouth daily. MedPass  . Nystatin (GERHARDT'S BUTT CREAM) CREA Apply 1 application topically 3 (three) times daily. Apply in a thin layer to affected area in the gluteal cleft three times daily and PRN after defecation and cleansing.  . Olodaterol HCl 2.5 MCG/ACT AERS Inhale 2 puffs into the lungs daily.  Marland Kitchen oxyCODONE (OXY IR/ROXICODONE) 5 MG immediate release tablet Take 1 tablet (5 mg total) by mouth every 6 (six) hours as needed for moderate pain.  . pantoprazole (PROTONIX) 40 MG tablet Take 1 tablet (40 mg total) by mouth daily.  . polyethylene glycol (MIRALAX / GLYCOLAX) 17 g  packet Take 17 g by mouth daily as needed for moderate constipation.  . tamsulosin (FLOMAX) 0.4 MG CAPS capsule Take 0.4 mg by mouth at bedtime.   Marland Kitchen umeclidinium bromide (INCRUSE ELLIPTA) 62.5 MCG/INH AEPB Inhale 1 puff into the lungs daily.  . [DISCONTINUED] atorvastatin (LIPITOR) 20 MG tablet Take 1 tablet (20 mg total) by mouth daily.  . [DISCONTINUED] Calcium Carb-Cholecalciferol (CALCIUM 1000 + D) 1000-800 MG-UNIT TABS Take 1 tablet by mouth daily.  . [DISCONTINUED] insulin aspart (NOVOLOG) 100 UNIT/ML injection insulin aspart (novoLOG) injection 0-15 Units 0-15 Units, Subcutaneous, 3 times daily with meals CBG < 70: Implement Hypoglycemia measures CBG 70 - 120: 0  units CBG 121 - 150: 2 units CBG 151 - 200: 3 units CBG 201 - 250: 5 units CBG 251 - 300: 8 units CBG 301 - 350: 11 units CBG 351 - 400: 15 units CBG > 400: call MD   No facility-administered encounter medications on file as of 03/24/2020.    Review of Systems  GENERAL: No change in appetite, no fatigue, no weight changes, no fever, chills or weakness SKIN: Denies rash, itching, wounds, ulcer sores, or nail abnormalities EYES: Denies change in vision, dry eyes, eye pain, itching or discharge EARS: Denies change in hearing, ringing in ears, or earache NOSE: Denies nasal congestion or epistaxis MOUTH and THROAT: Denies oral discomfort, gingival pain or bleeding, pain from teeth or hoarseness   RESPIRATORY: no cough, SOB, DOE, wheezing, hemoptysis CARDIAC: No chest pain, edema or palpitations GI: No abdominal pain, diarrhea, constipation, heart burn, nausea or vomiting GU: Denies dysuria, frequency, hematuria, incontinence, or discharge MUSCULOSKELETAL: Denies joint pain, muscle pain, back pain, restricted movement, or unusual weakness CIRCULATION: Denies claudication, edema of legs, varicosities, or cold extremities NEUROLOGICAL: Denies dizziness, syncope, numbness, or headache PSYCHIATRIC: Denies feelings of depression or anxiety. No report of hallucinations, insomnia, paranoia, or agitation ENDOCRINE: Denies polyphagia, polyuria, polydipsia, heat or cold intolerance HEME/LYMPH: Denies excessive bruising, petechia, enlarged lymph nodes, or bleeding problems IMMUNOLOGIC: Denies history of frequent infections, AIDS, or use of immunosuppressive agents   Immunization History  Administered Date(s) Administered  . PFIZER SARS-COV-2 Vaccination 12/06/2019, 12/27/2019   Pertinent  Health Maintenance Due  Topic Date Due  . OPHTHALMOLOGY EXAM  Never done  . URINE MICROALBUMIN  Never done  . COLONOSCOPY  Never done  . PNA vac Low Risk Adult (1 of 2 - PCV13) Never done  . INFLUENZA  VACCINE  03/23/2020  . HEMOGLOBIN A1C  07/17/2020  . FOOT EXAM  11/13/2020    Vitals:   03/24/20 1455  BP: 136/70  Pulse: 87  Resp: 18  Temp: 97.7 F (36.5 C)  TempSrc: Oral  Weight: 188 lb 14.9 oz (85.7 kg)  Height: 6\' 3"  (1.905 m)   Body mass index is 23.61 kg/m.  Physical Exam  GENERAL APPEARANCE: Well nourished. In no acute distress. Normal body habitus SKIN:  Skin is warm and dry. There are no suspicious lesions or rash HEAD: Normal in size and contour. No evidence of trauma EYES: Lids open and close normally. No blepharitis, entropion or ectropion. PERRL. Conjunctivae are clear and sclerae are white. Lenses are without opacity EARS: Pinnae are normal. Patient hears normal voice tunes of the examiner MOUTH and THROAT: Lips are without lesions. Oral mucosa is moist and without lesions. Tongue is normal in shape, size, and color and without lesions NECK: supple, trachea midline, no neck masses, no thyroid tenderness, no thyromegaly LYMPHATICS: No LAN in the neck, no supraclavicular  LAN RESPIRATORY: Breathing is even & unlabored, BS CTAB CARDIAC: RRR, no murmur,no extra heart sounds, no edema GI: Abdomen soft, normal BS, no masses, no tenderness, no hepatomegaly, no splenomegaly MUSCULOSKELETAL: No deformities. Movement at each extremity is full and painless. Strength is 5/5 at each extremity. Back is without kyphosis or scoliosis CIRCULATION: Pedal pulses are 2+. There is no edema of the legs, ankles and feet NEUROLOGICAL: There is no tremor. Speech is clear PSYCHIATRIC: Alert and oriented X 3. Affect and behavior are appropriate  Labs reviewed: Recent Labs    03/10/20 0234 03/10/20 0234 03/11/20 0318 03/11/20 0318 03/12/20 0251 03/12/20 0251 03/13/20 0304 03/15/20 0246 03/17/20 0152  NA 134*   < > 135   < > 136   < > 135 137 135  K 4.3   < > 3.7   < > 4.0   < > 3.6 4.0 3.9  CL 100   < > 102   < > 103   < > 102 104 102  CO2 27   < > 24   < > 24   < > 23 23 24     GLUCOSE 161*   < > 158*   < > 155*   < > 134* 142* 133*  BUN 12   < > 11   < > 12   < > 10 10 11   CREATININE 0.95   < > 0.93   < > 1.06   < > 0.99 0.92 1.08  CALCIUM 8.8*   < > 8.8*   < > 9.0   < > 8.8* 9.0 9.3  MG 1.9  --  2.0  --  1.8  --   --   --   --   PHOS 2.7   < > 2.7  --  2.9  --  2.5  --   --    < > = values in this interval not displayed.   Recent Labs    12/17/19 1312 01/16/20 1652 03/11/20 0318  AST 19 23 38  ALT 12 12 22   ALKPHOS 55 42 50  BILITOT 0.8 0.7 1.8*  PROT 6.8 5.0* 5.9*  ALBUMIN 3.7 2.6* 2.9*   Recent Labs    12/17/19 1312 12/17/19 1413 01/16/20 1652 01/17/20 0856 03/06/20 1417 03/06/20 1432 03/16/20 0244 03/17/20 0152 03/19/20 0119  WBC 6.8   < > 7.5   < > 9.5   < > 8.3 9.3 10.2  NEUTROABS 3.3  --  5.1  --  6.6  --   --   --   --   HGB 13.8   < > 10.4*   < > 10.9*   < > 9.0* 9.4* 9.5*  HCT 44.7   < > 32.4*   < > 35.8*   < > 28.5* 30.2* 29.2*  MCV 92.0   < > 89.5   < > 89.7   < > 87.2 87.0 86.9  PLT 152   < > 131*   < > 51*   < > 160 149* 128*   < > = values in this interval not displayed.   Lab Results  Component Value Date   TSH 1.786 07/06/2016   Lab Results  Component Value Date   HGBA1C 8.2 (H) 01/15/2020   Lab Results  Component Value Date   CHOL 124 03/07/2020   HDL 23 (L) 03/07/2020   LDLCALC 87 03/07/2020   TRIG 72 03/07/2020   CHOLHDL 5.4 03/07/2020    Significant Diagnostic  Results in last 30 days:  DG Abd 1 View  Result Date: 03/06/2020 CLINICAL DATA:  Arteriogram. EXAM: ABDOMEN - 1 VIEW COMPARISON:  March 06, 2020 FINDINGS: The patient has undergone abdominal aortic angiogram. An endograft is noted. The left limb of the graft appears to be patent. Several images demonstrate non patency of the right limb. IMPRESSION: The patient has undergone abdominal aortic angiogram. Please see separate operative report for complete details. Electronically Signed   By: Constance Holster M.D.   On: 03/06/2020 19:49   CT CHEST W  CONTRAST  Addendum Date: 03/14/2020   ADDENDUM REPORT: 03/14/2020 13:45 ADDENDUM: Thoracic surgeon Dr. Servando Snare called for a second opinion regarding the location of the cardiac mass. The irregular soft tissue mass within the left atrial appendage, left superior pulmonary vein and left aspect of the left atrium extending to mitral valve and potentially to the entrance of the left ventricle is contiguous with the large central left upper lobe lung mass and is favored to represent direct tumor invasion of these structures. While a component of bland thrombus is possible at the periphery of this cardiac mass, this is felt to largely represent direct cardiac tumor invasion. These results were discussed by telephone by Dr. Polly Cobia at 03/14/2020 at 1:44 pm with Dr. Servando Snare, who verbally acknowledged these results. Electronically Signed   By: Ilona Sorrel M.D.   On: 03/14/2020 13:45   Result Date: 03/14/2020 CLINICAL DATA:  Abnormal x-ray findings. EXAM: CT CHEST WITH CONTRAST TECHNIQUE: Multidetector CT imaging of the chest was performed during intravenous contrast administration. CONTRAST:  57mL OMNIPAQUE IOHEXOL 300 MG/ML  SOLN COMPARISON:  March 12, 2020 FINDINGS: Cardiovascular: The pulmonary arteries are limited in evaluation secondary to suboptimal opacification with intravenous contrast. Normal heart size. A 3.6 cm x 2.4 cm area of low attenuation is seen within the anterior aspect of the right atrium. This extends from a large left suprahilar mass. Mild involvement of the left atrium is noted. No pericardial effusion. Mediastinum/Nodes: No enlarged mediastinal, hilar, or axillary lymph nodes. Thyroid gland, trachea, and esophagus demonstrate no significant findings. Lungs/Pleura: There is a 10.5 cm x 5.7 cm lobulated soft tissue mass seen along the left hilar and left suprahilar regions. There is no evidence of acute infiltrate, pleural effusion or pneumothorax. Upper Abdomen: There is evidence of prior stenting  of the infrarenal abdominal aorta. Musculoskeletal: No chest wall abnormality. No acute or significant osseous findings. IMPRESSION: 1. 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 2. 3.6 cm x 2.4 cm area of low attenuation within the anterior aspect of the right atrium and adjacent portion of the left ventricle. While this is likely consistent with vascular invasion of the previously noted lung mass, a component of associated thrombus cannot be excluded. 3. Evidence of prior stenting of the infrarenal abdominal aorta. Electronically Signed: By: Virgina Norfolk M.D. On: 03/13/2020 19:05   CT ANGIO AO+BIFEM W & OR WO CONTRAST  Result Date: 03/06/2020 CLINICAL DATA:  74 year old with sudden onset of right leg weakness. Abdominal aortic aneurysm repair 1 month ago. EXAM: CT ANGIOGRAPHY OF ABDOMINAL AORTA WITH ILIOFEMORAL RUNOFF TECHNIQUE: Multidetector CT imaging of the abdomen, pelvis and lower extremities was performed using the standard protocol during bolus administration of intravenous contrast. Multiplanar CT image reconstructions and MIPs were obtained to evaluate the vascular anatomy. CONTRAST:  178mL OMNIPAQUE IOHEXOL 350 MG/ML SOLN COMPARISON:  CTA 01/16/2020 and 01/14/2020 FINDINGS: VASCULAR Heart: There is concern for filling defects  in the left atrium that could represent thrombus. This area is incompletely evaluated. This area of concern is seen on sequence 5, image 1. Aorta: Normal appearance of the distal descending thoracic aorta. Again noted is an aortic stent graft in the infrarenal aorta. Main body of the stent graft is patent but the right limb of the stent graft is occluded. Left limb of the graft remains patent. The abdominal aortic aneurysm sac measures 4.8 x 4.4 cm and previously measured 5.1 x 4.5 cm. No obvious endoleak on this arterial phase of imaging. No stranding or fluid around the aneurysm sac. Celiac: Celiac trunk and main branch  vessels are patent. SMA: Mild atherosclerotic disease at the origin without significant stenosis. Main branches of the SMA are patent. Renals: Single bilateral renal arteries are patent. IMA: Origin of the IMA is occluded due to the aneurysm sac. Evidence for reconstitution beyond the origin. RIGHT Lower Extremity Inflow: Right limb of the aortic stent graft is occluded. This represents a new finding. Chronic stenosis at the origin of the right internal iliac artery. Right external iliac artery is patent. Outflow: Flow postsurgical changes in the right groin. The right common femoral artery has atherosclerotic disease but patent. Right profunda femoral artery is patent. Right SFA is patent with areas of at least mild narrowing. Again noted is a short segment narrowing in the distal right SFA measuring close to 50% stenosis. These findings are unchanged. Right popliteal artery is patent. Again noted is focal narrowing in the right popliteal artery just above the knee joint and this may be related to a chronic dissection in this area. Degree of stenosis appears to be greater than 50% at the popliteal artery dissection. Runoff: Primary runoff is the peroneal artery which is similar to the previous examination. Small amount of flow in the proximal anterior tibial artery and there may be decreased flow along the anterior tibial artery collateral distribution. This may be related to a small amount of thrombus or slow flow. Posterior tibial occludes in the mid calf and this is similar to the previous examination. Previously, there was distal reconstitution of the posterior tibial artery which is not clearly identified on this examination. LEFT Lower Extremity Inflow: Left aortic stent graft limb is patent. Left common and external iliac artery stents remain patent. Outflow: Postsurgical changes around the left common femoral artery. Left common femoral artery is patent. Left profunda femoral arteries are patent. Left SFA  is patent with mild areas of narrowing. Stable appearance of the left SFA. Left popliteal artery is patent Runoff: Again noted is narrowing at the origin of the left anterior tibial artery. Left anterior tibial artery occludes in the proximal calf and there may be some areas of segmental reconstitution. Tibioperoneal trunk is patent. Peroneal artery is patent and there may be improved flow in the peroneal artery compared to the previous examination. Previously, there was a segment of the peroneal artery that was occluded. Posterior tibial artery occludes in the proximal calf. Evidence for segmental reconstitution of the posterior tibial artery. Veins: No obvious venous abnormality within the limitations of this arterial phase study. Review of the MIP images confirms the above findings. NON-VASCULAR Lower chest: Lung bases are clear without pleural effusions. Hepatobiliary: Gallbladder is decompressed. Main portal veins are patent. Mild dilatation of the proximal extrahepatic bile duct measuring up to 1.3 cm and unchanged. Distal common bile duct measures roughly 0.5 cm and similar to the previous examination. Pancreas: Unremarkable. No pancreatic ductal dilatation or surrounding inflammatory  changes. Spleen: Normal in size without focal abnormality. Adrenals/Urinary Tract: Normal appearance of the adrenal glands. Negative for hydronephrosis. Probable small renal cysts. Urinary bladder is unremarkable. Stomach/Bowel: Stomach is within normal limits. Appendix appears normal. No evidence of bowel wall thickening, distention, or inflammatory changes. Lymphatic: No significant lymph node enlargement in the abdomen or pelvis. Reproductive: Prostate is mildly prominent measuring 4.8 cm in transverse dimension. Other: Negative for ascites.  Negative for free air. Musculoskeletal: No acute bone abnormality. IMPRESSION: VASCULAR 1. Right limb of the bifurcated aortic stent graft is occluded. This is a new finding and  consistent with patient's right leg symptoms. 2. Concern for filling defects in the left atrium. This could represent left atrial thrombus and source for embolic disease. 3. Right runoff has slightly changed since 01/14/2020. There is now single-vessel runoff from the peroneal artery. Previously, there was flow at the ankle from the posterior tibial artery. The lack of flow in the distal posterior tibial artery could be related to slow flow from the occluded stent limb but cannot exclude embolic disease involving the right runoff vessels. 4. No significant change in the right outflow disease. Right SFA and right popliteal artery remain patent. There continues to be focal narrowing in the right popliteal artery possibly related to a dissection. 5. Left iliac artery stents remain patent. No significant change in the left outflow or runoff vessels. NON-VASCULAR 1. No acute abnormality in the abdomen or pelvis. These results were called by telephone at the time of interpretation on 03/06/2020 at 3:30 pm to provider Monica Martinez, MD, who verbally acknowledged these results. Electronically Signed   By: Markus Daft M.D.   On: 03/06/2020 16:13   CT CORONARY MORPH W/CTA COR W/SCORE W/CA W/CM &/OR WO/CM  Addendum Date: 03/13/2020   ADDENDUM REPORT: 03/13/2020 09:16 EXAM: OVER-READ INTERPRETATION  CT CHEST The following report is an over-read performed by radiologist Dr. Norlene Duel [facility] on 03/13/2020. This over-read does not include interpretation of cardiac or coronary anatomy or pathology. The coronary CTA and coronary calcium score interpretation by the cardiologist is attached. COMPARISON:  CTA 01/16/2020 FINDINGS: Vascular: Normal heart size.  No pericardial effusion identified. Mediastinum/nodes: Dilated and patulous esophagus. Lungs/pleura: There is a large, left upper lobe, perihilar lung mass which measures 8.9 x 5.5 cm, image 3/11. This mass encases the left hilum and occludes the left  upper lobe and lingular bronchi with signs of postobstructive pneumonitis in the left upper lobe. The mass invades the left upper lobe pulmonary vein with tumor thrombus identified in the left atrium. The tumor thrombus protrudes through the mitral valve into the left ventricle. Upper abdomen: No acute abnormality. Musculoskeletal: No acute abnormality. IMPRESSION: 1. 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 This encases the left hilum and occludes the left upper lobe and lingular bronchi with signs of postobstructive pneumonitis in the left upper lobe. The mass invades the left upper lobe pulmonary vein with tumor thrombus in the left atrium, extending through the mitral valve. Recommend further evaluation with PET-CT. Electronically Signed   By: Kerby Moors M.D.   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 EXAM: Cardiac/Coronary  CTA TECHNIQUE: The patient was scanned on a [employer]. FINDINGS: A 100 kV prospective scan was triggered in the descending  thoracic aorta at 111 HU's. Axial non-contrast 3 mm slices were carried out through the heart. The data set was analyzed on a dedicated work station and scored using the [place]. Gantry rotation speed was 250 msecs and collimation was .6 mm. No beta blockade and 0.8 mg of sl NTG was given. The 3D data set was reconstructed in 5% intervals of the 67-82 % of the R-R cycle. Diastolic phases were analyzed on a dedicated work station using MPR, MIP and VRT modes. The patient received 80 cc of contrast. Aorta: Normal size. There is moderate to severe diffuse atherosclerotic plaque with mild calcifications. No dissection. Aortic Valve:  Trileaflet.  No calcifications. Coronary Arteries: Normal coronary origin. Right dominance. Coronary  artery evaluation is affected by significant motion and poor vasodilation and therefore not interpretable. There are two stents in the proximal to mid RCA. 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 The thrombus is highly mobile with high risk of embolization. It is partially protruding into the left ventricle across the mitral valve and partially obstructing the flow to the left ventricle. The thrombus is only partially extending into the left atrial appendage. Dilated pulmonary artery suggestive of pulmonary hypertension. IMPRESSION: 1. Coronary Arteries: Normal coronary origin. Right dominance. Coronary artery evaluation is affected by significant motion and poor vasodilation and therefore not interpretable. There are two stents in the proximal to mid RCA. 2. There is moderate to severe diffuse atherosclerotic plaque with mild calcifications. No dissection. 3. 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 The thrombus is highly mobile with high risk of embolization. It is partially protruding into the left ventricle across the mitral valve and partially obstructing the flow to the left ventricle. The thrombus is only partially extending into the left atrial appendage. 4. Dilated pulmonary artery suggestive of pulmonary hypertension. Electronically Signed: By: Ena Dawley On: 03/12/2020 18:57   CT CORONARY FRACTIONAL FLOW RESERVE DATA PREP  Result Date: 03/21/2020 EXAM: CT FFR ANALYSIS CLINICAL DATA:  74 year old male with abnormal CCTA. FINDINGS: FFRct analysis was performed on the original cardiac CT angiogram dataset. Diagrammatic representation of the FFRct analysis is provided in a separate PDF document in PACS. This dictation was created  using the PDF document and an interactive 3D model of the results. 3D model is not available in the EMR/PACS. Normal FFR range is >0.80. 1. Left Main: 0.98. 2. LAD: Proximal: 0.96, mid: 0.90, distal (small lumen): 0.76. 3. LCX: 0.88. 4. OM1: 0.86 5. RCA: FFR analysis unavailable due to the presence of a metallic stent. IMPRESSION: 1. CT FFR analysis didn't show any significant stenosis in the left main, proximal or mid LAD or LCX arteries. CT FFR analysis unavailable for RCA due to the presence of a metallic stent. Electronically Signed   By: Ena Dawley   On: 03/21/2020 12:23   DG C-Arm 1-60 Min  Result Date: 03/06/2020 CLINICAL DATA:  Angiogram EXAM: DG C-ARM 1-60 MIN FLUOROSCOPY TIME:  Fluoroscopy Time:  3 minutes and 28 seconds Radiation Exposure Index (if provided by the fluoroscopic device): 74.67 mGy Number of Acquired Spot Images: 6 COMPARISON:  March 06, 2020 CT FINDINGS: The patient has undergone abdominal aortic angiogram. An endograft is noted. The left limb of  the graft appears to be patent. Several images demonstrate non patency of the right limb. IMPRESSION: The patient has undergone angiogram. Please see separate operative report for complete intraoperative details. Electronically Signed   By: Constance Holster M.D.   On: 03/06/2020 19:50   ECHO INTRAOPERATIVE TEE  Result Date: 03/11/2020  *INTRAOPERATIVE TRANSESOPHAGEAL REPORT *  Patient Name:   OTTAVIO NOREM  Date of Exam: 03/11/2020 Medical Rec #:  [id number]      Height:       75.0 in Accession #:    [id number]     Weight:       194.0 lb Date of Birth:  February 24, 1946       BSA:          2.17 m Patient Age:    57 years       BP:           137/74 mmHg Patient Gender: M              HR:           75 bpm. Exam Location:  Anesthesiology Transesophogeal exam was perform intraoperatively during surgical procedure. Patient was closely monitored under general anesthesia during the entirety of examination. Indications:     left atrial thrombus  Sonographer:     Jannett Celestine RDCS (AE) Performing Phys: Annye Asa MD Diagnosing Phys: Annye Asa MD Report CC'd to:  Servando Snare MD Complications: No known complications during this procedure. PRE-OP FINDINGS  Left Ventricle: The left ventricle has low normal systolic function, with an ejection fraction of 50-55% 52%. The cavity size was normal. There is no increase in left ventricular wall thickness. No evidence of left ventricular regional wall motion abnormalities. Right Ventricle: The right ventricle has normal systolic function. The cavity was normal. There is no increase in right ventricular wall thickness. Left Atrium: Left atrial size was mildly dilated. There is a very large, lobulated, mobile thrombus present in the L atrium. The atrial portion measures 2.58 x3.04 cm, however, the mass appears to extend well into the L superior pulmonary vein. It most likely obstructs flow from the L superior pulmonary vein. The thrombus does traverse the mitral apparatus during diastole, but it does not seem to affect forward flow. The left atrial appendage is well visualized and there is no evidence of thrombus present in it. Left atrial appendage velocity is normal at greater than 40 cm/s. Right Atrium: Right atrial size was normal in size. Right atrial pressure is estimated at 10 mmHg. Interatrial Septum: No atrial level shunt detected by color flow Doppler. Pericardium: There is no evidence of pericardial effusion. Mitral Valve: The mitral valve is normal in structure. No thickening of the mitral valve leaflet. No calcification of the mitral valve leaflet. Mitral valve regurgitation is not visualized by color flow Doppler. There is no evidence of mitral valve vegetation. There is no evidence of mitral stenosis. Tricuspid Valve: The tricuspid valve was normal in structure. Tricuspid valve regurgitation is trivial by color flow Doppler. There is no evidence of tricuspid valve vegetation. Aortic Valve:  The aortic valve is tricuspid Aortic valve regurgitation was not visualized by color flow Doppler. There is no evidence of aortic valve stenosis. There is no evidence of a vegetation on the aortic valve. Pulmonic Valve: The pulmonic valve was normal in structure, with normal leaflet excursion. No evidence of pulmonic stenosis. Pulmonic valve regurgitation is trivial by color flow Doppler. Aorta: The aortic root, ascending aorta and aortic arch are normal  in size and structure. There is evidence of scattered layered plaque in the descending aorta; Grade I, measuring 1-46mm in size. Pulmonary Artery: The pulmonary artery is of normal size. Venous: The inferior vena cava was not well visualized, but appears normal in size. +--------------+-------++ LEFT VENTRICLE        +--------------+-------++ PLAX 2D               +--------------+-------++ LVIDd:        4.13 cm +--------------+-------++ LVIDs:        3.03 cm +--------------+-------++ LV SV:        40 ml   +--------------+-------++ LV SV Index:  18.36   +--------------+-------++                       +--------------+-------++ +-------------+-----------++ AORTIC VALVE             +-------------+-----------++ AV Vmax:     143.00 cm/s +-------------+-----------++ AV Vmean:    94.100 cm/s +-------------+-----------++ AV VTI:      0.208 m     +-------------+-----------++ AV Peak Grad:8.2 mmHg    +-------------+-----------++ AV Mean Grad:4.0 mmHg    +-------------+-----------++ +-------------+---------++ MITRAL VALVE           +-------------+---------++ MV Peak grad:5.7 mmHg  +-------------+---------++ MV Mean grad:2.0 mmHg  +-------------+---------++ MV Vmax:     1.19 m/s  +-------------+---------++ MV Vmean:    68.8 cm/s +-------------+---------++ MV VTI:      0.32 m    +-------------+---------++  Annye Asa MD Electronically signed by Annye Asa MD Signature Date/Time:  03/11/2020/2:09:20 PM    Final    ECHO INTRAOPERATIVE TEE  Result Date: 03/07/2020  *INTRAOPERATIVE TRANSESOPHAGEAL REPORT *  Patient Name:   Cochise Zuckerman Date of Exam: 03/06/2020 Medical Rec #:  [id number]     Height:       75.0 in Accession #:    [id number]    Weight:       174.2 lb Date of Birth:  [birth year]      BSA:          2.07 m Patient Age:    17 years      BP:           113/72 mmHg Patient Gender: M             HR:           83 bpm. Exam Location:  Inpatient Transesophogeal exam was perform intraoperatively during surgical procedure. Patient was closely monitored under general anesthesia during the entirety of examination. Indications:     Left atrial thrombus Performing Phys: [id number] Gwenyth Allegra CLARK Diagnosing Phys: Albertha Ghee MD Complications: No known complications during this procedure. POST-OP IMPRESSIONS Overall, there were no significant changes from pre-bypass. PRE-OP FINDINGS  Left Ventricle: The left ventricle has normal systolic function, with an ejection fraction of 55-60%. The cavity size was normal. There is mild concentric left ventricular hypertrophy. No evidence of left ventricular regional wall motion abnormalities. Right Ventricle: The right ventricle has normal systolic function. The cavity was normal. There is no increase in right ventricular wall thickness. Left Atrium: Left atrial size was normal in size. There is a large, mobile, lobulated mass in the LA. The echogenic character is largely homogenous. The edges are smooth and rounded. This is likely a thrombus. It measures approximately 10cm x 5cm. It appears anchored deep within the LUPV and the remaining portion is freely mobile. The most distal portion of the mass extends  through the mitral valve during diastole. No significant inflow obstruction is seen. The left atrial appendage is well visualized and there is no evidence of thrombus present. Right Atrium: Right atrial size was normal in size. Right atrial pressure is  estimated at 10 mmHg. Interatrial Septum: No atrial level shunt detected by color flow Doppler. Pericardium: There is no evidence of pericardial effusion. Mitral Valve: The mitral valve is normal in structure. Mitral valve regurgitation is trivial by color flow Doppler. Tricuspid Valve: The tricuspid valve was normal in structure. Tricuspid valve regurgitation is trivial by color flow Doppler. Aortic Valve: The aortic valve is normal in structure. Aortic valve regurgitation was not visualized by color flow Doppler. There is no evidence of aortic valve stenosis. Pulmonic Valve: The pulmonic valve was normal in structure, with normal. Pulmonic valve regurgitation is not visualized by color flow Doppler. Aorta: The ascending aorta and aortic root are normal in size and structure. There is evidence of atheroma immobile plaque in the descending aorta and aortic arch; Grade IV, measuring >72mm in size.  Albertha Ghee MD Electronically signed by Albertha Ghee MD Signature Date/Time: 03/07/2020/9:21:44 AM    Final    VAS US CAROTID  Result Date: 03/12/2020 Carotid Arterial Duplex Study Indications:       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 Risk Factors:      Hypertension, Diabetes, coronary artery disease. Comparison Study:  No prior studies. Performing Technologist: Darlin Coco  Examination Guidelines: A complete evaluation includes B-mode imaging, spectral Doppler, color Doppler, and power Doppler as needed of all accessible portions of each vessel. Bilateral testing is considered an integral part of a complete examination. Limited examinations for reoccurring indications may be performed as noted.  Right Carotid Findings: +----------+--------+--------+--------+-----------------------+--------+           PSV cm/sEDV cm/sStenosisPlaque Description     Comments +----------+--------+--------+--------+-----------------------+--------+ CCA Prox  63      12              heterogenous and smooth          +----------+--------+--------+--------+-----------------------+--------+ CCA Mid   100     21              heterogenous and smooth         +----------+--------+--------+--------+-----------------------+--------+ CCA Distal80      13              heterogenous and smooth         +----------+--------+--------+--------+-----------------------+--------+ ICA Prox  47      13      1-39%   heterogenous                    +----------+--------+--------+--------+-----------------------+--------+ ICA Distal52      20                                              +----------+--------+--------+--------+-----------------------+--------+ ECA       51      9                                               +----------+--------+--------+--------+-----------------------+--------+ +----------+--------+-------+----------------+-------------------+           PSV cm/sEDV cmsDescribe  Arm Pressure (mmHG) +----------+--------+-------+----------------+-------------------+ Subclavian               Multiphasic, WNL                    +----------+--------+-------+----------------+-------------------+ +---------+--------+--+--------+--+---------+ VertebralPSV cm/s73EDV cm/s22Antegrade +---------+--------+--+--------+--+---------+  Left Carotid Findings: +----------+--------+--------+--------+-----------------------+--------+           PSV cm/sEDV cm/sStenosisPlaque Description     Comments +----------+--------+--------+--------+-----------------------+--------+ CCA Prox  97      20              heterogenous                    +----------+--------+--------+--------+-----------------------+--------+ CCA Mid   93      19              heterogenous and smooth         +----------+--------+--------+--------+-----------------------+--------+ CCA Distal104     25              heterogenous and smooth          +----------+--------+--------+--------+-----------------------+--------+ ICA Prox  59      10      1-39%   heterogenous                    +----------+--------+--------+--------+-----------------------+--------+ ICA Distal92      34                                              +----------+--------+--------+--------+-----------------------+--------+ ECA       97      13                                              +----------+--------+--------+--------+-----------------------+--------+ +----------+--------+--------+----------------+-------------------+           PSV cm/sEDV cm/sDescribe        Arm Pressure (mmHG) +----------+--------+--------+----------------+-------------------+ Subclavian110             Multiphasic, WNL                    +----------+--------+--------+----------------+-------------------+ +---------+--------+--+--------+--+---------+ VertebralPSV cm/s93EDV cm/s29Antegrade +---------+--------+--+--------+--+---------+   Summary: Right Carotid: Velocities in the right ICA are consistent with a 1-39% stenosis.                Non-hemodynamically significant plaque <50% noted in the CCA. Left Carotid: Velocities in the left ICA are consistent with a 1-39% stenosis.               Non-hemodynamically significant plaque <50% noted in the CCA.  *See table(s) above for measurements and observations.  Electronically signed by Ruta Hinds MD on 03/12/2020 at 6:14:00 PM.    Final     Assessment/Plan  1. Advance care planning - remains to be full code -  Discussed medications, vital signs and weights  2. Occlusion of right femorotibial bypass graft, subsequent encounter   3. 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 Thrombocytopenia (HCC) ***  5. AF (paroxysmal atrial fibrillation) (HCC) ***  6. Chronic obstructive pulmonary disease, unspecified COPD type ([place]) ***  7. DM (diabetes mellitus), secondary, uncontrolled, with peripheral  vascular complications (HCC) ***  8. Benign prostatic hyperplasia without lower urinary tract symptoms ***    Family/ staff  Communication: ***  Labs/tests ordered:  ***  Goals of care:   Short-term rehabilitation.    Durenda Age, DNP, MSN, FNP-BC [facility] [phone] (Monday-Friday 8:00 a.m. - 5:00 p.m.) [phone] (after hours)

## 2020-03-25 NOTE — Progress Notes (Deleted)
Location:  [facility] Room Number: 216-A Place of Service:  SNF (31) Provider:  Durenda Age, DNP, FNP-BC  Patient Care Team: [place] as PCP - General ([place]) [name], MD as PCP - Cardiology (Cardiology) Patient, No Pcp Per (General Practice)  Extended Emergency Contact Information Primary Emergency Contact: Goley,Antionette Address: [address]          [place], [place] [address] [place] Phone: [phone] Mobile Phone: [phone] Relation: Spouse  Code Status:  FULL CODE  Goals of care: Advanced Directive information Advanced Directives 03/07/2020  Does Patient Have a Medical Advance Directive? No  Would patient like information on creating a medical advance directive? No - Patient declined         Chief Complaint  Patient presents with  . advance care plan meeting    Discussion of medications and care plan    HPI:  Pt is a 74 y.o. male who had a care plan meeting attended by resident, social worker, MDS coordinator, NP, treatment nurse, administrator, OT, PT and wife, who attended via teleconference.  He remains to be full code.  Discussed medications, vital signs and weights.  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 There was a concern for confusion. Speech therapy requested to perform SLUMS to evaluate cognition. He was admitted to [facility] on 03/19/20 post hospitalization 03/06/20 to 03/19/20. He has PMH history of AAA S/P repair in 01/10/20 with endovascular stent graft S/P left iliac thrombectomy and left external iliac stent, PVD, hypertension, type 2 diabetes mellitus, CAD and atrial fibrillation. He presented to the hospital with right ischemic leg and was found to have occluded right/left limb of the aorto-biiliac endograft and now S/P emergent thrombectomy and RLE 4 compartment fasciotomy. Further evaluation with intraoperative  TEE showed a mobile left atrial thrombus. Subsequent CT imaging study showed a large left lung mass-eroding into the mediastinum into the pulmonary vein and into the left atrium. Suspicion for tumor embolism causing ischemic leg. In 2019, he was diagnosed with squamous cell carcinoma of the lung at [facility] for which he refused treatment. The meeting lasted for 30 minutes.  Wound was checked earlier and treatment nurse stated that there was no change on the wound as compared upon admission. Right lower leg surgical wound with staples with the outer surgical wound dehisced with minimal serous drainage.       Past Medical History:  Diagnosis Date  . Asthma   . Coronary artery disease   . Diabetes mellitus without complication ([place])   . History of kidney stones   . Hypertension   . Peripheral vascular disease [facility])         Past Surgical History:  Procedure Laterality Date  . AORTOGRAM  03/06/2020   Procedure: AORTOGRAM WITH BILATERAL ILIAC ARTERIOGRAM;  Surgeon: Marty Heck, MD;  Location: [facility];  Service: Vascular;;  . CARDIAC CATHETERIZATION N/A 07/05/2016   Procedure: Left Heart Cath and Coronary Angiography;  Surgeon: Sherren Mocha, MD;  Location: [facility];  Service: Cardiovascular;  Laterality: N/A;  . CORONARY ANGIOPLASTY    . EMBOLECTOMY Bilateral 03/06/2020   Procedure: THROMBECTOMY OF RIGHT LIMB OF BIFURCATED AORTO-BI-ILIAC STENT GRAFT; RIGHT LOWER EXTREMITY THROMBECTOMY USING THE FEMORAL APPROACH; REDO EXPOSURE LEFT COMMON FEMORAL ARTERY; THROMBECTOMY LEFT LIMB OF AORTO-BI-ILIAC STENT GRAFT;  LEFT LOWER EXTREMITY THROMBECTOMY USING THE FEMORAL APPROACH;  Surgeon: Marty Heck, MD;  Location: [facility];  Service: Vascular;  Laterality: Bilateral;  . ENDOVASCULAR STENT INSERTION N/A 01/15/2020   Procedure: ENDOVASCULAR ANEURYSM REPAIR;  Surgeon: Waynetta Sandy, MD;  Location: [facility];  Service: Vascular;  Laterality: N/A;  . FASCIOTOMY  Right 03/06/2020   Procedure: RIGHT LOWER EXTREMITY FOUR COMPARTMENT FASCIOTOMIES;  Surgeon: Marty Heck, MD;  Location: [facility];  Service: Vascular;  Laterality: Right;  . FASCIOTOMY CLOSURE Right 03/11/2020   Procedure: RIGHT LOWER EXTREMITY FASCIOTOMY WASHOUT AND CLOSURE;  Surgeon: Waynetta Sandy, MD;  Location: [facility];  Service: Vascular;  Laterality: Right;  . heart stents  2006  . INSERTION OF ILIAC STENT Left 01/15/2020   Procedure: INSERTION OF LEFT EXTERNAL ILIAC ARTERY STENT;  Surgeon: Waynetta Sandy, MD;  Location: [facility];  Service: Vascular;  Laterality: Left;  . LOWER EXTREMITY ANGIOGRAM  01/15/2020   Procedure: Left Lower Extremity Angiogram;  Surgeon: Waynetta Sandy, MD;  Location: [facility];  Service: Vascular;;  . TEE WITHOUT CARDIOVERSION  03/11/2020   Procedure: TRANSESOPHAGEAL ECHOCARDIOGRAM (TEE);  Surgeon: Waynetta Sandy, MD;  Location: [facility];  Service: Vascular;;  . THROMBECTOMY ILIAC ARTERY  01/15/2020   Procedure: THROMBECTOMY OF LEFT ILIAC ARTERY;  Surgeon: Waynetta Sandy, MD;  Location: [facility];  Service: Vascular;;         Allergies  Allergen Reactions  . Erythromycin Shortness Of Breath and Swelling  . Simvastatin Other (See Comments)    CAUSE MUSCLE PAIN,.  . Statins Other (See Comments)    Muscle pain        Outpatient Encounter Medications as of 03/24/2020  Medication Sig  . acetaminophen (TYLENOL) 325 MG tablet Take 2 tablets (650 mg total) by mouth every 6 (six) hours as needed for mild pain (or temp >/= 101 F).  Marland Kitchen albuterol (PROVENTIL HFA;VENTOLIN HFA) 108 (90 BASE) MCG/ACT inhaler Inhale 2 puffs into the lungs every 6 (six) hours as needed for wheezing or shortness of breath.   . Calcium Carb-Cholecalciferol ([employer] CALCIUM 600 & VITAMIN D3) 600-800 MG-UNIT TABS Take 1 tablet by mouth daily.  . Calcium Carbonate-Simethicone (ALKA-SELTZER HEARTBURN + GAS PO) Take 2 each by mouth every 4  (four) hours as needed.  . clopidogrel (PLAVIX) 75 MG tablet Take 75 mg by mouth daily.  . Cyanocobalamin (VITAMIN B 12 PO) Take 500 mcg by mouth daily.   . diclofenac sodium (VOLTAREN) 1 % GEL Apply 4 g topically 3 (three) times daily as needed (pain).   Marland Kitchen enoxaparin (LOVENOX) 80 MG/0.8ML injection Inject 0.8 mLs (80 mg total) into the skin every 12 (twelve) hours.  . finasteride (PROSCAR) 5 MG tablet Take 5 mg by mouth daily.  . hydroxypropyl methylcellulose / hypromellose (ISOPTO TEARS / GONIOVISC) 2.5 % ophthalmic solution Place 1-2 drops into both eyes in the morning and at bedtime.   . insulin glargine (LANTUS SOLOSTAR) 100 UNIT/ML Solostar Pen Inject 12 Units into the skin daily.  Marland Kitchen loratadine (CLARITIN) 10 MG tablet Take 10 mg by mouth daily.  . metFORMIN (GLUCOPHAGE-XR) 500 MG 24 hr tablet Take 1,000 mg by mouth in the morning and at bedtime.   . nitroGLYCERIN (NITROSTAT) 0.4 MG SL tablet Place 0.4 mg under the tongue every 5 (five) minutes as needed for chest pain.  . Nutritional Supplement LIQD Take 120 mLs by mouth daily. MedPass  . Nystatin (GERHARDT'S BUTT CREAM) CREA Apply 1 application topically 3 (three) times daily. Apply in a thin layer to affected area in the gluteal cleft three times daily and  PRN after defecation and cleansing.  . Olodaterol HCl 2.5 MCG/ACT AERS Inhale 2 puffs into the lungs daily.  Marland Kitchen oxyCODONE (OXY IR/ROXICODONE) 5 MG immediate release tablet Take 1 tablet (5 mg total) by mouth every 6 (six) hours as needed for moderate pain.  . pantoprazole (PROTONIX) 40 MG tablet Take 1 tablet (40 mg total) by mouth daily.  . polyethylene glycol (MIRALAX / GLYCOLAX) 17 g packet Take 17 g by mouth daily as needed for moderate constipation.  . tamsulosin (FLOMAX) 0.4 MG CAPS capsule Take 0.4 mg by mouth at bedtime.   Marland Kitchen umeclidinium bromide (INCRUSE ELLIPTA) 62.5 MCG/INH AEPB Inhale 1 puff into the lungs daily.  . [DISCONTINUED] atorvastatin (LIPITOR) 20 MG tablet Take 1  tablet (20 mg total) by mouth daily.  . [DISCONTINUED] Calcium Carb-Cholecalciferol (CALCIUM 1000 + D) 1000-800 MG-UNIT TABS Take 1 tablet by mouth daily.  . [DISCONTINUED] insulin aspart (NOVOLOG) 100 UNIT/ML injection insulin aspart (novoLOG) injection 0-15 Units 0-15 Units, Subcutaneous, 3 times daily with meals CBG < 70: Implement Hypoglycemia measures CBG 70 - 120: 0 units CBG 121 - 150: 2 units CBG 151 - 200: 3 units CBG 201 - 250: 5 units CBG 251 - 300: 8 units CBG 301 - 350: 11 units CBG 351 - 400: 15 units CBG > 400: call MD   No facility-administered encounter medications on file as of 03/24/2020.    Review of Systems  GENERAL: No change in appetite, no fatigue, no weight changes, no fever or chills  MOUTH and THROAT: Denies oral discomfort, gingival pain or bleeding RESPIRATORY: no cough, SOB, DOE, wheezing, hemoptysis CARDIAC: No chest pain, edema or palpitations GI: No abdominal pain, diarrhea, constipation, heart burn, nausea or vomiting GU: Denies dysuria, frequency, hematuria, incontinence, or discharge NEUROLOGICAL: Denies dizziness, syncope, numbness, or headache PSYCHIATRIC: Denies feelings of depression or anxiety. No report of hallucinations, insomnia, paranoia, or agitation       Immunization History  Administered Date(s) Administered  . PFIZER SARS-COV-2 Vaccination 12/06/2019, 12/27/2019       Pertinent  Health Maintenance Due  Topic Date Due  . OPHTHALMOLOGY EXAM  Never done  . URINE MICROALBUMIN  Never done  . COLONOSCOPY  Never done  . PNA vac Low Risk Adult (1 of 2 - PCV13) Never done  . INFLUENZA VACCINE  03/23/2020  . HEMOGLOBIN A1C  07/17/2020  . FOOT EXAM  11/13/2020       Vitals:   03/24/20 1455  BP: 136/70  Pulse: 87  Resp: 18  Temp: 97.7 F (36.5 C)  TempSrc: Oral  Weight: 188 lb 14.9 oz (85.7 kg)  Height: 6\' 3"  (1.905 m)   Body mass index is 23.61 kg/m.  Physical Exam  GENERAL APPEARANCE: Well nourished. In  no acute distress. Normal body habitus SKIN:  Bilateral inguinal surgical wounds dry, no erythema; Right lower leg surgical wound with staples with the right outer leg wound noted with slight serous drainage MOUTH and THROAT: Lips are without lesions. Oral mucosa is moist and without lesions. Tongue is normal in shape, size, and color and without lesions RESPIRATORY: Breathing is even & unlabored, BS CTAB CARDIAC: RRR, no murmur,no extra heart sounds GI: Abdomen soft, normal BS, no masses, no tenderness NEUROLOGICAL: There is no tremor. Speech is clear. PSYCHIATRIC:  Affect and behavior are appropriate  Labs reviewed: Recent Labs (within last 365 days)             Recent Labs    03/10/20 0234 03/10/20 0234 03/11/20  7001 03/11/20 7494 03/12/20 0251 03/12/20 0251 03/13/20 0304 03/15/20 0246 03/17/20 0152  NA 134*   < > 135   < > 136   < > 135 137 135  K 4.3   < > 3.7   < > 4.0   < > 3.6 4.0 3.9  CL 100   < > 102   < > 103   < > 102 104 102  CO2 27   < > 24   < > 24   < > 23 23 24   GLUCOSE 161*   < > 158*   < > 155*   < > 134* 142* 133*  BUN 12   < > 11   < > 12   < > 10 10 11   CREATININE 0.95   < > 0.93   < > 1.06   < > 0.99 0.92 1.08  CALCIUM 8.8*   < > 8.8*   < > 9.0   < > 8.8* 9.0 9.3  MG 1.9  --  2.0  --  1.8  --   --   --   --   PHOS 2.7   < > 2.7  --  2.9  --  2.5  --   --    < > = values in this interval not displayed.     Recent Labs (within last 365 days)  Recent Labs    12/17/19 1312 01/16/20 1652 03/11/20 0318  AST 19 23 38  ALT 12 12 22   ALKPHOS 55 42 50  BILITOT 0.8 0.7 1.8*  PROT 6.8 5.0* 5.9*  ALBUMIN 3.7 2.6* 2.9*     Recent Labs (within last 365 days)             Recent Labs    12/17/19 1312 12/17/19 1413 01/16/20 1652 01/17/20 0856 03/06/20 1417 03/06/20 1432 03/16/20 0244 03/17/20 0152 03/19/20 0119  WBC 6.8   < > 7.5   < > 9.5   < > 8.3 9.3 10.2  NEUTROABS 3.3  --  5.1  --  6.6  --   --   --   --   HGB 13.8   < > 10.4*   < > 10.9*    < > 9.0* 9.4* 9.5*  HCT 44.7   < > 32.4*   < > 35.8*   < > 28.5* 30.2* 29.2*  MCV 92.0   < > 89.5   < > 89.7   < > 87.2 87.0 86.9  PLT 152   < > 131*   < > 51*   < > 160 149* 128*   < > = values in this interval not displayed.     Recent Labs       Lab Results  Component Value Date   TSH 1.786 07/06/2016     Recent Labs       Lab Results  Component Value Date   HGBA1C 8.2 (H) 01/15/2020     Recent Labs       Lab Results  Component Value Date   CHOL 124 03/07/2020   HDL 23 (L) 03/07/2020   LDLCALC 87 03/07/2020   TRIG 72 03/07/2020   CHOLHDL 5.4 03/07/2020      Significant Diagnostic Results in last 30 days:   Imaging Results  DG Abd 1 View  Result Date: 03/06/2020 CLINICAL DATA:  Arteriogram. EXAM: ABDOMEN - 1 VIEW COMPARISON:  March 06, 2020 FINDINGS: The patient has undergone abdominal aortic angiogram. An  endograft is noted. The left limb of the graft appears to be patent. Several images demonstrate non patency of the right limb. IMPRESSION: The patient has undergone abdominal aortic angiogram. Please see separate operative report for complete details. Electronically Signed   By: Constance Holster M.D.   On: 03/06/2020 19:49   CT CHEST W CONTRAST  Addendum Date: 03/14/2020   ADDENDUM REPORT: 03/14/2020 13:45 ADDENDUM: Thoracic surgeon Dr. Servando Snare called for a second opinion regarding the location of the cardiac mass. The irregular soft tissue mass within the left atrial appendage, left superior pulmonary vein and left aspect of the left atrium extending to mitral valve and potentially to the entrance of the left ventricle is contiguous with the large central left upper lobe lung mass and is favored to represent direct tumor invasion of these structures. While a component of bland thrombus is possible at the periphery of this cardiac mass, this is felt to largely represent direct cardiac tumor invasion. These results were discussed by telephone by Dr. Polly Cobia  at 03/14/2020 at 1:44 pm with Dr. Servando Snare, who verbally acknowledged these results. Electronically Signed   By: Ilona Sorrel M.D.   On: 03/14/2020 13:45   Result Date: 03/14/2020 CLINICAL DATA:  Abnormal x-ray findings. EXAM: CT CHEST WITH CONTRAST TECHNIQUE: Multidetector CT imaging of the chest was performed during intravenous contrast administration. CONTRAST:  63mL OMNIPAQUE IOHEXOL 300 MG/ML  SOLN COMPARISON:  March 12, 2020 FINDINGS: Cardiovascular: The pulmonary arteries are limited in evaluation secondary to suboptimal opacification with intravenous contrast. Normal heart size. A 3.6 cm x 2.4 cm area of low attenuation is seen within the anterior aspect of the right atrium. This extends from a large left suprahilar mass. Mild involvement of the left atrium is noted. No pericardial effusion. Mediastinum/Nodes: No enlarged mediastinal, hilar, or axillary lymph nodes. Thyroid gland, trachea, and esophagus demonstrate no significant findings. Lungs/Pleura: There is a 10.5 cm x 5.7 cm lobulated soft tissue mass seen along the left hilar and left suprahilar regions. There is no evidence of acute infiltrate, pleural effusion or pneumothorax. Upper Abdomen: There is evidence of prior stenting of the infrarenal abdominal aorta. Musculoskeletal: No chest wall abnormality. No acute or significant osseous findings. IMPRESSION: 1. 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 2. 3.6 cm x 2.4 cm area of low attenuation within the anterior aspect of the right atrium and adjacent portion of the left ventricle. While this is likely consistent with vascular invasion of the previously noted lung mass, a component of associated thrombus cannot be excluded. 3. Evidence of prior stenting of the infrarenal abdominal aorta. Electronically Signed: By: Virgina Norfolk M.D. On: 03/13/2020 19:05   CT ANGIO AO+BIFEM W & OR WO CONTRAST  Result Date: 03/06/2020 CLINICAL DATA:   74 year old with sudden onset of right leg weakness. Abdominal aortic aneurysm repair 1 month ago. EXAM: CT ANGIOGRAPHY OF ABDOMINAL AORTA WITH ILIOFEMORAL RUNOFF TECHNIQUE: Multidetector CT imaging of the abdomen, pelvis and lower extremities was performed using the standard protocol during bolus administration of intravenous contrast. Multiplanar CT image reconstructions and MIPs were obtained to evaluate the vascular anatomy. CONTRAST:  138mL OMNIPAQUE IOHEXOL 350 MG/ML SOLN COMPARISON:  CTA 01/16/2020 and 01/14/2020 FINDINGS: VASCULAR Heart: There is concern for filling defects in the left atrium that could represent thrombus. This area is incompletely evaluated. This area of concern is seen on sequence 5, image 1. Aorta: Normal appearance of the distal descending thoracic aorta. Again noted is an  aortic stent graft in the infrarenal aorta. Main body of the stent graft is patent but the right limb of the stent graft is occluded. Left limb of the graft remains patent. The abdominal aortic aneurysm sac measures 4.8 x 4.4 cm and previously measured 5.1 x 4.5 cm. No obvious endoleak on this arterial phase of imaging. No stranding or fluid around the aneurysm sac. Celiac: Celiac trunk and main branch vessels are patent. SMA: Mild atherosclerotic disease at the origin without significant stenosis. Main branches of the SMA are patent. Renals: Single bilateral renal arteries are patent. IMA: Origin of the IMA is occluded due to the aneurysm sac. Evidence for reconstitution beyond the origin. RIGHT Lower Extremity Inflow: Right limb of the aortic stent graft is occluded. This represents a new finding. Chronic stenosis at the origin of the right internal iliac artery. Right external iliac artery is patent. Outflow: Flow postsurgical changes in the right groin. The right common femoral artery has atherosclerotic disease but patent. Right profunda femoral artery is patent. Right SFA is patent with areas of at least mild  narrowing. Again noted is a short segment narrowing in the distal right SFA measuring close to 50% stenosis. These findings are unchanged. Right popliteal artery is patent. Again noted is focal narrowing in the right popliteal artery just above the knee joint and this may be related to a chronic dissection in this area. Degree of stenosis appears to be greater than 50% at the popliteal artery dissection. Runoff: Primary runoff is the peroneal artery which is similar to the previous examination. Small amount of flow in the proximal anterior tibial artery and there may be decreased flow along the anterior tibial artery collateral distribution. This may be related to a small amount of thrombus or slow flow. Posterior tibial occludes in the mid calf and this is similar to the previous examination. Previously, there was distal reconstitution of the posterior tibial artery which is not clearly identified on this examination. LEFT Lower Extremity Inflow: Left aortic stent graft limb is patent. Left common and external iliac artery stents remain patent. Outflow: Postsurgical changes around the left common femoral artery. Left common femoral artery is patent. Left profunda femoral arteries are patent. Left SFA is patent with mild areas of narrowing. Stable appearance of the left SFA. Left popliteal artery is patent Runoff: Again noted is narrowing at the origin of the left anterior tibial artery. Left anterior tibial artery occludes in the proximal calf and there may be some areas of segmental reconstitution. Tibioperoneal trunk is patent. Peroneal artery is patent and there may be improved flow in the peroneal artery compared to the previous examination. Previously, there was a segment of the peroneal artery that was occluded. Posterior tibial artery occludes in the proximal calf. Evidence for segmental reconstitution of the posterior tibial artery. Veins: No obvious venous abnormality within the limitations of this  arterial phase study. Review of the MIP images confirms the above findings. NON-VASCULAR Lower chest: Lung bases are clear without pleural effusions. Hepatobiliary: Gallbladder is decompressed. Main portal veins are patent. Mild dilatation of the proximal extrahepatic bile duct measuring up to 1.3 cm and unchanged. Distal common bile duct measures roughly 0.5 cm and similar to the previous examination. Pancreas: Unremarkable. No pancreatic ductal dilatation or surrounding inflammatory changes. Spleen: Normal in size without focal abnormality. Adrenals/Urinary Tract: Normal appearance of the adrenal glands. Negative for hydronephrosis. Probable small renal cysts. Urinary bladder is unremarkable. Stomach/Bowel: Stomach is within normal limits. Appendix appears normal. No  evidence of bowel wall thickening, distention, or inflammatory changes. Lymphatic: No significant lymph node enlargement in the abdomen or pelvis. Reproductive: Prostate is mildly prominent measuring 4.8 cm in transverse dimension. Other: Negative for ascites.  Negative for free air. Musculoskeletal: No acute bone abnormality. IMPRESSION: VASCULAR 1. Right limb of the bifurcated aortic stent graft is occluded. This is a new finding and consistent with patient's right leg symptoms. 2. Concern for filling defects in the left atrium. This could represent left atrial thrombus and source for embolic disease. 3. Right runoff has slightly changed since 01/14/2020. There is now single-vessel runoff from the peroneal artery. Previously, there was flow at the ankle from the posterior tibial artery. The lack of flow in the distal posterior tibial artery could be related to slow flow from the occluded stent limb but cannot exclude embolic disease involving the right runoff vessels. 4. No significant change in the right outflow disease. Right SFA and right popliteal artery remain patent. There continues to be focal narrowing in the right popliteal artery possibly  related to a dissection. 5. Left iliac artery stents remain patent. No significant change in the left outflow or runoff vessels. NON-VASCULAR 1. No acute abnormality in the abdomen or pelvis. These results were called by telephone at the time of interpretation on 03/06/2020 at 3:30 pm to provider Monica Martinez, MD, who verbally acknowledged these results. Electronically Signed   By: Markus Daft M.D.   On: 03/06/2020 16:13   CT CORONARY MORPH W/CTA COR W/SCORE W/CA W/CM &/OR WO/CM  Addendum Date: 03/13/2020   ADDENDUM REPORT: 03/13/2020 09:16 EXAM: OVER-READ INTERPRETATION  CT CHEST The following report is an over-read performed by radiologist Dr. Norlene Duel [facility] on 03/13/2020. This over-read does not include interpretation of cardiac or coronary anatomy or pathology. The coronary CTA and coronary calcium score interpretation by the cardiologist is attached. COMPARISON:  CTA 01/16/2020 FINDINGS: Vascular: Normal heart size.  No pericardial effusion identified. Mediastinum/nodes: Dilated and patulous esophagus. Lungs/pleura: There is a large, left upper lobe, perihilar lung mass which measures 8.9 x 5.5 cm, image 3/11. This mass encases the left hilum and occludes the left upper lobe and lingular bronchi with signs of postobstructive pneumonitis in the left upper lobe. The mass invades the left upper lobe pulmonary vein with tumor thrombus identified in the left atrium. The tumor thrombus protrudes through the mitral valve into the left ventricle. Upper abdomen: No acute abnormality. Musculoskeletal: No acute abnormality. IMPRESSION: 1. 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 This encases the left hilum and occludes the left upper lobe and lingular bronchi with signs of postobstructive pneumonitis in the left upper lobe. The mass invades the left upper lobe pulmonary vein with tumor thrombus in the left atrium, extending  through the mitral valve. Recommend further evaluation with PET-CT. Electronically Signed   By: Kerby Moors M.D.   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 EXAM: Cardiac/Coronary  CTA TECHNIQUE: The patient was scanned on a [employer]. FINDINGS: A 100 kV prospective scan was triggered in the descending thoracic aorta at 111 HU's. Axial non-contrast 3 mm slices were carried out through the heart. The data set was analyzed on a dedicated work station and scored using the [place]. Gantry rotation speed was 250  msecs and collimation was .6 mm. No beta blockade and 0.8 mg of sl NTG was given. The 3D data set was reconstructed in 5% intervals of the 67-82 % of the R-R cycle. Diastolic phases were analyzed on a dedicated work station using MPR, MIP and VRT modes. The patient received 80 cc of contrast. Aorta: Normal size. There is moderate to severe diffuse atherosclerotic plaque with mild calcifications. No dissection. Aortic Valve:  Trileaflet.  No calcifications. Coronary Arteries: Normal coronary origin. Right dominance. Coronary artery evaluation is affected by significant motion and poor vasodilation and therefore not interpretable. There are two stents in the proximal to mid RCA. 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 The thrombus is highly mobile with high risk of embolization. It is partially protruding into the left ventricle across the mitral valve and partially obstructing the flow to the left ventricle. The thrombus is only partially extending into the left atrial appendage. Dilated pulmonary artery suggestive of pulmonary hypertension. IMPRESSION: 1. Coronary Arteries: Normal coronary origin. Right  dominance. Coronary artery evaluation is affected by significant motion and poor vasodilation and therefore not interpretable. There are two stents in the proximal to mid RCA. 2. There is moderate to severe diffuse atherosclerotic plaque with mild calcifications. No dissection. 3. 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 The thrombus is highly mobile with high risk of embolization. It is partially protruding into the left ventricle across the mitral valve and partially obstructing the flow to the left ventricle. The thrombus is only partially extending into the left atrial appendage. 4. Dilated pulmonary artery suggestive of pulmonary hypertension. Electronically Signed: By: Ena Dawley On: 03/12/2020 18:57   CT CORONARY FRACTIONAL FLOW RESERVE DATA PREP  Result Date: 03/21/2020 EXAM: CT FFR ANALYSIS CLINICAL DATA:  74 year old male with abnormal CCTA. FINDINGS: FFRct analysis was performed on the original cardiac CT angiogram dataset. Diagrammatic representation of the FFRct analysis is provided in a separate PDF document in PACS. This dictation was created using the PDF document and an interactive 3D model of the results. 3D model is not available in the EMR/PACS. Normal FFR range is >0.80. 1. Left Main: 0.98. 2. LAD: Proximal: 0.96, mid: 0.90, distal (small lumen): 0.76. 3. LCX: 0.88. 4. OM1: 0.86 5. RCA: FFR analysis unavailable due to the presence of a metallic stent. IMPRESSION: 1. CT FFR analysis didn't show any significant stenosis in the left main, proximal or mid LAD or LCX arteries. CT FFR analysis unavailable for RCA due to the presence of a metallic stent. Electronically Signed   By: Ena Dawley   On: 03/21/2020 12:23   DG C-Arm 1-60 Min  Result Date: 03/06/2020 CLINICAL DATA:  Angiogram EXAM: DG C-ARM 1-60 MIN FLUOROSCOPY TIME:  Fluoroscopy Time:   3 minutes and 28 seconds Radiation Exposure Index (if provided by the fluoroscopic device): 74.67 mGy Number of Acquired Spot Images: 6 COMPARISON:  March 06, 2020 CT FINDINGS: The patient has undergone abdominal aortic angiogram. An endograft is noted. The left limb of the graft appears to be patent. Several images demonstrate non patency of the right limb. IMPRESSION: The patient has undergone angiogram. Please see separate operative report for complete intraoperative details. Electronically Signed   By: Harrell Gave  Green M.D.   On: 03/06/2020 19:50   ECHO INTRAOPERATIVE TEE  Result Date: 03/11/2020  *INTRAOPERATIVE TRANSESOPHAGEAL REPORT *  Patient Name:   RAEKWAN SPELMAN  Date of Exam: 03/11/2020 Medical Rec #:  [id number]      Height:       75.0 in Accession #:    [id number]     Weight:       194.0 lb Date of Birth:  Apr 25, 1946       BSA:          2.17 m Patient Age:    37 years       BP:           137/74 mmHg Patient Gender: M              HR:           75 bpm. Exam Location:  Anesthesiology Transesophogeal exam was perform intraoperatively during surgical procedure. Patient was closely monitored under general anesthesia during the entirety of examination. Indications:     left atrial thrombus Sonographer:     Jannett Celestine RDCS (AE) Performing Phys: Annye Asa MD Diagnosing Phys: Annye Asa MD Report CC'd to:  Servando Snare MD Complications: No known complications during this procedure. PRE-OP FINDINGS  Left Ventricle: The left ventricle has low normal systolic function, with an ejection fraction of 50-55% 52%. The cavity size was normal. There is no increase in left ventricular wall thickness. No evidence of left ventricular regional wall motion abnormalities. Right Ventricle: The right ventricle has normal systolic function. The cavity was normal. There is no increase in right ventricular wall thickness. Left Atrium: Left atrial size was mildly dilated. There is a very large, lobulated, mobile  thrombus present in the L atrium. The atrial portion measures 2.58 x3.04 cm, however, the mass appears to extend well into the L superior pulmonary vein. It most likely obstructs flow from the L superior pulmonary vein. The thrombus does traverse the mitral apparatus during diastole, but it does not seem to affect forward flow. The left atrial appendage is well visualized and there is no evidence of thrombus present in it. Left atrial appendage velocity is normal at greater than 40 cm/s. Right Atrium: Right atrial size was normal in size. Right atrial pressure is estimated at 10 mmHg. Interatrial Septum: No atrial level shunt detected by color flow Doppler. Pericardium: There is no evidence of pericardial effusion. Mitral Valve: The mitral valve is normal in structure. No thickening of the mitral valve leaflet. No calcification of the mitral valve leaflet. Mitral valve regurgitation is not visualized by color flow Doppler. There is no evidence of mitral valve vegetation. There is no evidence of mitral stenosis. Tricuspid Valve: The tricuspid valve was normal in structure. Tricuspid valve regurgitation is trivial by color flow Doppler. There is no evidence of tricuspid valve vegetation. Aortic Valve: The aortic valve is tricuspid Aortic valve regurgitation was not visualized by color flow Doppler. There is no evidence of aortic valve stenosis. There is no evidence of a vegetation on the aortic valve. Pulmonic Valve: The pulmonic valve was normal in structure, with normal leaflet excursion. No evidence of pulmonic stenosis. Pulmonic valve regurgitation is trivial by color flow Doppler. Aorta: The aortic root, ascending aorta and aortic arch are normal in size and structure. There is evidence of scattered layered plaque in the descending aorta; Grade I, measuring 1-18mm in size. Pulmonary Artery: The pulmonary artery is of normal size. Venous: The inferior vena cava was not well  visualized, but appears normal in size.  +--------------+-------++ LEFT VENTRICLE        +--------------+-------++ PLAX 2D               +--------------+-------++ LVIDd:        4.13 cm +--------------+-------++ LVIDs:        3.03 cm +--------------+-------++ LV SV:        40 ml   +--------------+-------++ LV SV Index:  18.36   +--------------+-------++                       +--------------+-------++ +-------------+-----------++ AORTIC VALVE             +-------------+-----------++ AV Vmax:     143.00 cm/s +-------------+-----------++ AV Vmean:    94.100 cm/s +-------------+-----------++ AV VTI:      0.208 m     +-------------+-----------++ AV Peak Grad:8.2 mmHg    +-------------+-----------++ AV Mean Grad:4.0 mmHg    +-------------+-----------++ +-------------+---------++ MITRAL VALVE           +-------------+---------++ MV Peak grad:5.7 mmHg  +-------------+---------++ MV Mean grad:2.0 mmHg  +-------------+---------++ MV Vmax:     1.19 m/s  +-------------+---------++ MV Vmean:    68.8 cm/s +-------------+---------++ MV VTI:      0.32 m    +-------------+---------++  Annye Asa MD Electronically signed by Annye Asa MD Signature Date/Time: 03/11/2020/2:09:20 PM    Final    ECHO INTRAOPERATIVE TEE  Result Date: 03/07/2020  *INTRAOPERATIVE TRANSESOPHAGEAL REPORT *  Patient Name:   Ezekial Guthrie Date of Exam: 03/06/2020 Medical Rec #:  [id number]     Height:       75.0 in Accession #:    [id number]    Weight:       174.2 lb Date of Birth:  1946-02-17      BSA:          2.07 m Patient Age:    46 years      BP:           113/72 mmHg Patient Gender: M             HR:           83 bpm. Exam Location:  Inpatient Transesophogeal exam was perform intraoperatively during surgical procedure. Patient was closely monitored under general anesthesia during the entirety of examination. Indications:     Left atrial thrombus Performing Phys: [id number] Gwenyth Allegra CLARK Diagnosing Phys:  Albertha Ghee MD Complications: No known complications during this procedure. POST-OP IMPRESSIONS Overall, there were no significant changes from pre-bypass. PRE-OP FINDINGS  Left Ventricle: The left ventricle has normal systolic function, with an ejection fraction of 55-60%. The cavity size was normal. There is mild concentric left ventricular hypertrophy. No evidence of left ventricular regional wall motion abnormalities. Right Ventricle: The right ventricle has normal systolic function. The cavity was normal. There is no increase in right ventricular wall thickness. Left Atrium: Left atrial size was normal in size. There is a large, mobile, lobulated mass in the LA. The echogenic character is largely homogenous. The edges are smooth and rounded. This is likely a thrombus. It measures approximately 10cm x 5cm. It appears anchored deep within the LUPV and the remaining portion is freely mobile. The most distal portion of the mass extends through the mitral valve during diastole. No significant inflow obstruction is seen. The left atrial appendage is well visualized and there is no evidence of thrombus present. Right Atrium: Right atrial size was normal in size. Right  atrial pressure is estimated at 10 mmHg. Interatrial Septum: No atrial level shunt detected by color flow Doppler. Pericardium: There is no evidence of pericardial effusion. Mitral Valve: The mitral valve is normal in structure. Mitral valve regurgitation is trivial by color flow Doppler. Tricuspid Valve: The tricuspid valve was normal in structure. Tricuspid valve regurgitation is trivial by color flow Doppler. Aortic Valve: The aortic valve is normal in structure. Aortic valve regurgitation was not visualized by color flow Doppler. There is no evidence of aortic valve stenosis. Pulmonic Valve: The pulmonic valve was normal in structure, with normal. Pulmonic valve regurgitation is not visualized by color flow Doppler. Aorta: The ascending aorta and  aortic root are normal in size and structure. There is evidence of atheroma immobile plaque in the descending aorta and aortic arch; Grade IV, measuring >62mm in size.  Albertha Ghee MD Electronically signed by Albertha Ghee MD Signature Date/Time: 03/07/2020/9:21:44 AM    Final    VAS US CAROTID  Result Date: 03/12/2020 Carotid Arterial Duplex Study Indications:       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 Risk Factors:      Hypertension, Diabetes, coronary artery disease. Comparison Study:  No prior studies. Performing Technologist: Darlin Coco  Examination Guidelines: A complete evaluation includes B-mode imaging, spectral Doppler, color Doppler, and power Doppler as needed of all accessible portions of each vessel. Bilateral testing is considered an integral part of a complete examination. Limited examinations for reoccurring indications may be performed as noted.  Right Carotid Findings: +----------+--------+--------+--------+-----------------------+--------+           PSV cm/sEDV cm/sStenosisPlaque Description     Comments +----------+--------+--------+--------+-----------------------+--------+ CCA Prox  63      12              heterogenous and smooth         +----------+--------+--------+--------+-----------------------+--------+ CCA Mid   100     21              heterogenous and smooth         +----------+--------+--------+--------+-----------------------+--------+ CCA Distal80      13              heterogenous and smooth         +----------+--------+--------+--------+-----------------------+--------+ ICA Prox  47      13      1-39%   heterogenous                    +----------+--------+--------+--------+-----------------------+--------+ ICA Distal52      20                                              +----------+--------+--------+--------+-----------------------+--------+ ECA       51      9                                                +----------+--------+--------+--------+-----------------------+--------+ +----------+--------+-------+----------------+-------------------+           PSV cm/sEDV cmsDescribe        Arm Pressure (mmHG) +----------+--------+-------+----------------+-------------------+ Subclavian               Multiphasic, WNL                    +----------+--------+-------+----------------+-------------------+ +---------+--------+--+--------+--+---------+  VertebralPSV cm/s73EDV cm/s22Antegrade +---------+--------+--+--------+--+---------+  Left Carotid Findings: +----------+--------+--------+--------+-----------------------+--------+           PSV cm/sEDV cm/sStenosisPlaque Description     Comments +----------+--------+--------+--------+-----------------------+--------+ CCA Prox  97      20              heterogenous                    +----------+--------+--------+--------+-----------------------+--------+ CCA Mid   93      19              heterogenous and smooth         +----------+--------+--------+--------+-----------------------+--------+ CCA Distal104     25              heterogenous and smooth         +----------+--------+--------+--------+-----------------------+--------+ ICA Prox  59      10      1-39%   heterogenous                    +----------+--------+--------+--------+-----------------------+--------+ ICA Distal92      34                                              +----------+--------+--------+--------+-----------------------+--------+ ECA       97      13                                              +----------+--------+--------+--------+-----------------------+--------+ +----------+--------+--------+----------------+-------------------+           PSV cm/sEDV cm/sDescribe        Arm Pressure (mmHG) +----------+--------+--------+----------------+-------------------+ Subclavian110             Multiphasic, WNL                     +----------+--------+--------+----------------+-------------------+ +---------+--------+--+--------+--+---------+ VertebralPSV cm/s93EDV cm/s29Antegrade +---------+--------+--+--------+--+---------+   Summary: Right Carotid: Velocities in the right ICA are consistent with a 1-39% stenosis.                Non-hemodynamically significant plaque <50% noted in the CCA. Left Carotid: Velocities in the left ICA are consistent with a 1-39% stenosis.               Non-hemodynamically significant plaque <50% noted in the CCA.  *See table(s) above for measurements and observations.  Electronically signed by Ruta Hinds MD on 03/12/2020 at 6:14:00 PM.    Final      Assessment/Plan  1. Advance care planning ***  2. Occlusion of right femorotibial bypass graft, subsequent encounter ***  3. 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 Thrombocytopenia (HCC) ***  5. AF (paroxysmal atrial fibrillation) (HCC) ***  6. Chronic obstructive pulmonary disease, unspecified COPD type (HCC) ***  7. DM (diabetes mellitus), secondary, uncontrolled, with peripheral vascular complications (HCC) ***  8. Benign prostatic hyperplasia without lower urinary tract symptoms ***     Family/ staff Communication:  Discussed plan of care with resident, wife and IDT.  Labs/tests ordered:  None  Goals of care:   Short-term care  Durenda Age, DNP, MSN, FNP-BC [facility] [phone] (Monday-Friday 8:00 a.m. - 5:00 p.m.) [phone] (after hours)

## 2020-03-25 NOTE — Progress Notes (Addendum)
Location:  [facility] Room Number: 216-A Place of Service:  SNF (31) Provider:  Durenda Age, DNP, FNP-BC  Patient Care Team: [place] as PCP - General ([place]) [name], MD as PCP - Cardiology (Cardiology) Patient, No Pcp Per (General Practice)  Extended Emergency Contact Information Primary Emergency Contact: Malmquist,Antionette Address: [address]          [place], [place] [address] [place] Phone: [phone] Mobile Phone: [phone] Relation: Spouse  Code Status:  FULL CODE  Goals of care: Advanced Directive information Advanced Directives 03/07/2020  Does Patient Have a Medical Advance Directive? No  Would patient like information on creating a medical advance directive? No - Patient declined     Chief Complaint  Patient presents with  . advance care plan meeting    Discussion of medications and care plan    HPI:  Pt is a 74 y.o. male who had a care plan meeting attended by resident, social worker, MDS coordinator, NP, treatment nurse, administrator, OT, PT and wife, who attended via teleconference.  He remains to be full code.  Discussed medications, vital signs and weights.  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 There was a concern for confusion. Speech therapy requested to perform SLUMS to evaluate cognition. He was admitted to [facility] on 03/19/20 post hospitalization 03/06/20 to 03/19/20. He has PMH history of AAA S/P repair in 01/10/20 with endovascular stent graft S/P left iliac thrombectomy and left external iliac stent, PVD, hypertension, type 2 diabetes mellitus, CAD and atrial fibrillation. He presented to the hospital with right ischemic leg and was found to have occluded right/left limb of the aorto-biiliac endograft and now S/P emergent thrombectomy and RLE 4 compartment fasciotomy. Further evaluation with intraoperative TEE showed a  mobile left atrial thrombus. Subsequent CT imaging study showed a large left lung mass-eroding into the mediastinum into the pulmonary vein and into the left atrium. Suspicion for tumor embolism causing ischemic leg. In 2019, he was diagnosed with squamous cell carcinoma of the lung at [facility] for which he refused treatment. The meeting lasted for 30 minutes.  Wound was checked earlier and treatment nurse stated that there was no change on the wound as compared upon admission. Right lower leg surgical wound with staples with the outer surgical wound dehisced with minimal serous drainage.   Past Medical History:  Diagnosis Date  . Asthma   . Coronary artery disease   . Diabetes mellitus without complication ([place])   . History of kidney stones   . Hypertension   . Peripheral vascular disease [facility])    Past Surgical History:  Procedure Laterality Date  . AORTOGRAM  03/06/2020   Procedure: AORTOGRAM WITH BILATERAL ILIAC ARTERIOGRAM;  Surgeon: Marty Heck, MD;  Location: [place];  Service: Vascular;;  . CARDIAC CATHETERIZATION N/A 07/05/2016   Procedure: Left Heart Cath and Coronary Angiography;  Surgeon: Sherren Mocha, MD;  Location: [place] CV LAB;  Service: Cardiovascular;  Laterality: N/A;  . CORONARY ANGIOPLASTY    . EMBOLECTOMY Bilateral 03/06/2020   Procedure: THROMBECTOMY OF RIGHT LIMB OF BIFURCATED AORTO-BI-ILIAC STENT GRAFT; RIGHT LOWER EXTREMITY THROMBECTOMY USING THE FEMORAL APPROACH; REDO EXPOSURE LEFT COMMON FEMORAL ARTERY; THROMBECTOMY LEFT LIMB OF AORTO-BI-ILIAC STENT GRAFT;  LEFT LOWER EXTREMITY THROMBECTOMY USING THE FEMORAL APPROACH;  Surgeon: Marty Heck, MD;  Location: [place];  Service: Vascular;  Laterality: Bilateral;  . ENDOVASCULAR STENT INSERTION N/A 01/15/2020  Procedure: ENDOVASCULAR ANEURYSM REPAIR;  Surgeon: Waynetta Sandy, MD;  Location: [place];  Service: Vascular;  Laterality: N/A;  . FASCIOTOMY Right 03/06/2020   Procedure: RIGHT LOWER  EXTREMITY FOUR COMPARTMENT FASCIOTOMIES;  Surgeon: Marty Heck, MD;  Location: [place];  Service: Vascular;  Laterality: Right;  . FASCIOTOMY CLOSURE Right 03/11/2020   Procedure: RIGHT LOWER EXTREMITY FASCIOTOMY WASHOUT AND CLOSURE;  Surgeon: Waynetta Sandy, MD;  Location: [place];  Service: Vascular;  Laterality: Right;  . heart stents  2006  . INSERTION OF ILIAC STENT Left 01/15/2020   Procedure: INSERTION OF LEFT EXTERNAL ILIAC ARTERY STENT;  Surgeon: Waynetta Sandy, MD;  Location: [place];  Service: Vascular;  Laterality: Left;  . LOWER EXTREMITY ANGIOGRAM  01/15/2020   Procedure: Left Lower Extremity Angiogram;  Surgeon: Waynetta Sandy, MD;  Location: [facility];  Service: Vascular;;  . TEE WITHOUT CARDIOVERSION  03/11/2020   Procedure: TRANSESOPHAGEAL ECHOCARDIOGRAM (TEE);  Surgeon: Waynetta Sandy, MD;  Location: [place];  Service: Vascular;;  . THROMBECTOMY ILIAC ARTERY  01/15/2020   Procedure: THROMBECTOMY OF LEFT ILIAC ARTERY;  Surgeon: Waynetta Sandy, MD;  Location: [facility];  Service: Vascular;;    Allergies  Allergen Reactions  . Erythromycin Shortness Of Breath and Swelling  . Simvastatin Other (See Comments)    CAUSE MUSCLE PAIN,.  . Statins Other (See Comments)    Muscle pain    Outpatient Encounter Medications as of 03/24/2020  Medication Sig  . acetaminophen (TYLENOL) 325 MG tablet Take 2 tablets (650 mg total) by mouth every 6 (six) hours as needed for mild pain (or temp >/= 101 F).  Marland Kitchen albuterol (PROVENTIL HFA;VENTOLIN HFA) 108 (90 BASE) MCG/ACT inhaler Inhale 2 puffs into the lungs every 6 (six) hours as needed for wheezing or shortness of breath.   . Calcium Carb-Cholecalciferol ([employer] CALCIUM 600 & VITAMIN D3) 600-800 MG-UNIT TABS Take 1 tablet by mouth daily.  . Calcium Carbonate-Simethicone (ALKA-SELTZER HEARTBURN + GAS PO) Take 2 each by mouth every 4 (four) hours as needed.  . clopidogrel (PLAVIX) 75 MG tablet Take 75 mg by  mouth daily.  . Cyanocobalamin (VITAMIN B 12 PO) Take 500 mcg by mouth daily.   . diclofenac sodium (VOLTAREN) 1 % GEL Apply 4 g topically 3 (three) times daily as needed (pain).   Marland Kitchen enoxaparin (LOVENOX) 80 MG/0.8ML injection Inject 0.8 mLs (80 mg total) into the skin every 12 (twelve) hours.  . finasteride (PROSCAR) 5 MG tablet Take 5 mg by mouth daily.  . hydroxypropyl methylcellulose / hypromellose (ISOPTO TEARS / GONIOVISC) 2.5 % ophthalmic solution Place 1-2 drops into both eyes in the morning and at bedtime.   . insulin glargine (LANTUS SOLOSTAR) 100 UNIT/ML Solostar Pen Inject 12 Units into the skin daily.  Marland Kitchen loratadine (CLARITIN) 10 MG tablet Take 10 mg by mouth daily.  . metFORMIN (GLUCOPHAGE-XR) 500 MG 24 hr tablet Take 1,000 mg by mouth in the morning and at bedtime.   . nitroGLYCERIN (NITROSTAT) 0.4 MG SL tablet Place 0.4 mg under the tongue every 5 (five) minutes as needed for chest pain.  . Nutritional Supplement LIQD Take 120 mLs by mouth daily. MedPass  . Nystatin (GERHARDT'S BUTT CREAM) CREA Apply 1 application topically 3 (three) times daily. Apply in a thin layer to affected area in the gluteal cleft three times daily and PRN after defecation and cleansing.  . Olodaterol HCl 2.5 MCG/ACT AERS Inhale 2 puffs into the lungs daily.  Marland Kitchen oxyCODONE (OXY  IR/ROXICODONE) 5 MG immediate release tablet Take 1 tablet (5 mg total) by mouth every 6 (six) hours as needed for moderate pain.  . pantoprazole (PROTONIX) 40 MG tablet Take 1 tablet (40 mg total) by mouth daily.  . polyethylene glycol (MIRALAX / GLYCOLAX) 17 g packet Take 17 g by mouth daily as needed for moderate constipation.  . tamsulosin (FLOMAX) 0.4 MG CAPS capsule Take 0.4 mg by mouth at bedtime.   Marland Kitchen umeclidinium bromide (INCRUSE ELLIPTA) 62.5 MCG/INH AEPB Inhale 1 puff into the lungs daily.  . [DISCONTINUED] atorvastatin (LIPITOR) 20 MG tablet Take 1 tablet (20 mg total) by mouth daily.  . [DISCONTINUED] Calcium  Carb-Cholecalciferol (CALCIUM 1000 + D) 1000-800 MG-UNIT TABS Take 1 tablet by mouth daily.  . [DISCONTINUED] insulin aspart (NOVOLOG) 100 UNIT/ML injection insulin aspart (novoLOG) injection 0-15 Units 0-15 Units, Subcutaneous, 3 times daily with meals CBG < 70: Implement Hypoglycemia measures CBG 70 - 120: 0 units CBG 121 - 150: 2 units CBG 151 - 200: 3 units CBG 201 - 250: 5 units CBG 251 - 300: 8 units CBG 301 - 350: 11 units CBG 351 - 400: 15 units CBG > 400: call MD   No facility-administered encounter medications on file as of 03/24/2020.    Review of Systems  GENERAL: No change in appetite, no fatigue, no weight changes, no fever or chills  MOUTH and THROAT: Denies oral discomfort, gingival pain or bleeding RESPIRATORY: no cough, SOB, DOE, wheezing, hemoptysis CARDIAC: No chest pain, edema or palpitations GI: No abdominal pain, diarrhea, constipation, heart burn, nausea or vomiting GU: Denies dysuria, frequency, hematuria, incontinence, or discharge NEUROLOGICAL: Denies dizziness, syncope, numbness, or headache PSYCHIATRIC: Denies feelings of depression or anxiety. No report of hallucinations, insomnia, paranoia, or agitation   Immunization History  Administered Date(s) Administered  . PFIZER SARS-COV-2 Vaccination 12/06/2019, 12/27/2019   Pertinent  Health Maintenance Due  Topic Date Due  . OPHTHALMOLOGY EXAM  Never done  . URINE MICROALBUMIN  Never done  . COLONOSCOPY  Never done  . PNA vac Low Risk Adult (1 of 2 - PCV13) Never done  . INFLUENZA VACCINE  03/23/2020  . HEMOGLOBIN A1C  07/17/2020  . FOOT EXAM  11/13/2020    Vitals:   03/24/20 1455  BP: 136/70  Pulse: 87  Resp: 18  Temp: 97.7 F (36.5 C)  TempSrc: Oral  Weight: 188 lb 14.9 oz (85.7 kg)  Height: 6\' 3"  (1.905 m)   Body mass index is 23.61 kg/m.  Physical Exam  GENERAL APPEARANCE: Well nourished. In no acute distress. Normal body habitus SKIN:  Bilateral inguinal surgical wounds dry, no  erythema; Right lower leg surgical wound with staples with the right outer leg wound noted with slight serous drainage MOUTH and THROAT: Lips are without lesions. Oral mucosa is moist and without lesions. Tongue is normal in shape, size, and color and without lesions RESPIRATORY: Breathing is even & unlabored, BS CTAB CARDIAC: RRR, no murmur,no extra heart sounds GI: Abdomen soft, normal BS, no masses, no tenderness NEUROLOGICAL: There is no tremor. Speech is clear. PSYCHIATRIC:  Affect and behavior are appropriate  Labs reviewed: Recent Labs    03/10/20 0234 03/10/20 0234 03/11/20 0318 03/11/20 0318 03/12/20 0251 03/12/20 0251 03/13/20 0304 03/15/20 0246 03/17/20 0152  NA 134*   < > 135   < > 136   < > 135 137 135  K 4.3   < > 3.7   < > 4.0   < > 3.6 4.0  3.9  CL 100   < > 102   < > 103   < > 102 104 102  CO2 27   < > 24   < > 24   < > 23 23 24   GLUCOSE 161*   < > 158*   < > 155*   < > 134* 142* 133*  BUN 12   < > 11   < > 12   < > 10 10 11   CREATININE 0.95   < > 0.93   < > 1.06   < > 0.99 0.92 1.08  CALCIUM 8.8*   < > 8.8*   < > 9.0   < > 8.8* 9.0 9.3  MG 1.9  --  2.0  --  1.8  --   --   --   --   PHOS 2.7   < > 2.7  --  2.9  --  2.5  --   --    < > = values in this interval not displayed.   Recent Labs    12/17/19 1312 01/16/20 1652 03/11/20 0318  AST 19 23 38  ALT 12 12 22   ALKPHOS 55 42 50  BILITOT 0.8 0.7 1.8*  PROT 6.8 5.0* 5.9*  ALBUMIN 3.7 2.6* 2.9*   Recent Labs    12/17/19 1312 12/17/19 1413 01/16/20 1652 01/17/20 0856 03/06/20 1417 03/06/20 1432 03/16/20 0244 03/17/20 0152 03/19/20 0119  WBC 6.8   < > 7.5   < > 9.5   < > 8.3 9.3 10.2  NEUTROABS 3.3  --  5.1  --  6.6  --   --   --   --   HGB 13.8   < > 10.4*   < > 10.9*   < > 9.0* 9.4* 9.5*  HCT 44.7   < > 32.4*   < > 35.8*   < > 28.5* 30.2* 29.2*  MCV 92.0   < > 89.5   < > 89.7   < > 87.2 87.0 86.9  PLT 152   < > 131*   < > 51*   < > 160 149* 128*   < > = values in this interval not displayed.     Lab Results  Component Value Date   TSH 1.786 07/06/2016   Lab Results  Component Value Date   HGBA1C 8.2 (H) 01/15/2020   Lab Results  Component Value Date   CHOL 124 03/07/2020   HDL 23 (L) 03/07/2020   LDLCALC 87 03/07/2020   TRIG 72 03/07/2020   CHOLHDL 5.4 03/07/2020    Significant Diagnostic Results in last 30 days:  DG Abd 1 View  Result Date: 03/06/2020 CLINICAL DATA:  Arteriogram. EXAM: ABDOMEN - 1 VIEW COMPARISON:  March 06, 2020 FINDINGS: The patient has undergone abdominal aortic angiogram. An endograft is noted. The left limb of the graft appears to be patent. Several images demonstrate non patency of the right limb. IMPRESSION: The patient has undergone abdominal aortic angiogram. Please see separate operative report for complete details. Electronically Signed   By: Constance Holster M.D.   On: 03/06/2020 19:49   CT CHEST W CONTRAST  Addendum Date: 03/14/2020   ADDENDUM REPORT: 03/14/2020 13:45 ADDENDUM: Thoracic surgeon Dr. Servando Snare called for a second opinion regarding the location of the cardiac mass. The irregular soft tissue mass within the left atrial appendage, left superior pulmonary vein and left aspect of the left atrium extending to mitral valve and potentially to the entrance of  the left ventricle is contiguous with the large central left upper lobe lung mass and is favored to represent direct tumor invasion of these structures. While a component of bland thrombus is possible at the periphery of this cardiac mass, this is felt to largely represent direct cardiac tumor invasion. These results were discussed by telephone by Dr. Polly Cobia at 03/14/2020 at 1:44 pm with Dr. Servando Snare, who verbally acknowledged these results. Electronically Signed   By: Ilona Sorrel M.D.   On: 03/14/2020 13:45   Result Date: 03/14/2020 CLINICAL DATA:  Abnormal x-ray findings. EXAM: CT CHEST WITH CONTRAST TECHNIQUE: Multidetector CT imaging of the chest was performed during intravenous  contrast administration. CONTRAST:  38mL OMNIPAQUE IOHEXOL 300 MG/ML  SOLN COMPARISON:  March 12, 2020 FINDINGS: Cardiovascular: The pulmonary arteries are limited in evaluation secondary to suboptimal opacification with intravenous contrast. Normal heart size. A 3.6 cm x 2.4 cm area of low attenuation is seen within the anterior aspect of the right atrium. This extends from a large left suprahilar mass. Mild involvement of the left atrium is noted. No pericardial effusion. Mediastinum/Nodes: No enlarged mediastinal, hilar, or axillary lymph nodes. Thyroid gland, trachea, and esophagus demonstrate no significant findings. Lungs/Pleura: There is a 10.5 cm x 5.7 cm lobulated soft tissue mass seen along the left hilar and left suprahilar regions. There is no evidence of acute infiltrate, pleural effusion or pneumothorax. Upper Abdomen: There is evidence of prior stenting of the infrarenal abdominal aorta. Musculoskeletal: No chest wall abnormality. No acute or significant osseous findings. IMPRESSION: 1. 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 2. 3.6 cm x 2.4 cm area of low attenuation within the anterior aspect of the right atrium and adjacent portion of the left ventricle. While this is likely consistent with vascular invasion of the previously noted lung mass, a component of associated thrombus cannot be excluded. 3. Evidence of prior stenting of the infrarenal abdominal aorta. Electronically Signed: By: Virgina Norfolk M.D. On: 03/13/2020 19:05   CT ANGIO AO+BIFEM W & OR WO CONTRAST  Result Date: 03/06/2020 CLINICAL DATA:  74 year old with sudden onset of right leg weakness. Abdominal aortic aneurysm repair 1 month ago. EXAM: CT ANGIOGRAPHY OF ABDOMINAL AORTA WITH ILIOFEMORAL RUNOFF TECHNIQUE: Multidetector CT imaging of the abdomen, pelvis and lower extremities was performed using the standard protocol during bolus administration of intravenous contrast.  Multiplanar CT image reconstructions and MIPs were obtained to evaluate the vascular anatomy. CONTRAST:  132mL OMNIPAQUE IOHEXOL 350 MG/ML SOLN COMPARISON:  CTA 01/16/2020 and 01/14/2020 FINDINGS: VASCULAR Heart: There is concern for filling defects in the left atrium that could represent thrombus. This area is incompletely evaluated. This area of concern is seen on sequence 5, image 1. Aorta: Normal appearance of the distal descending thoracic aorta. Again noted is an aortic stent graft in the infrarenal aorta. Main body of the stent graft is patent but the right limb of the stent graft is occluded. Left limb of the graft remains patent. The abdominal aortic aneurysm sac measures 4.8 x 4.4 cm and previously measured 5.1 x 4.5 cm. No obvious endoleak on this arterial phase of imaging. No stranding or fluid around the aneurysm sac. Celiac: Celiac trunk and main branch vessels are patent. SMA: Mild atherosclerotic disease at the origin without significant stenosis. Main branches of the SMA are patent. Renals: Single bilateral renal arteries are patent. IMA: Origin of the IMA is occluded due to the aneurysm sac. Evidence for reconstitution beyond the  origin. RIGHT Lower Extremity Inflow: Right limb of the aortic stent graft is occluded. This represents a new finding. Chronic stenosis at the origin of the right internal iliac artery. Right external iliac artery is patent. Outflow: Flow postsurgical changes in the right groin. The right common femoral artery has atherosclerotic disease but patent. Right profunda femoral artery is patent. Right SFA is patent with areas of at least mild narrowing. Again noted is a short segment narrowing in the distal right SFA measuring close to 50% stenosis. These findings are unchanged. Right popliteal artery is patent. Again noted is focal narrowing in the right popliteal artery just above the knee joint and this may be related to a chronic dissection in this area. Degree of stenosis  appears to be greater than 50% at the popliteal artery dissection. Runoff: Primary runoff is the peroneal artery which is similar to the previous examination. Small amount of flow in the proximal anterior tibial artery and there may be decreased flow along the anterior tibial artery collateral distribution. This may be related to a small amount of thrombus or slow flow. Posterior tibial occludes in the mid calf and this is similar to the previous examination. Previously, there was distal reconstitution of the posterior tibial artery which is not clearly identified on this examination. LEFT Lower Extremity Inflow: Left aortic stent graft limb is patent. Left common and external iliac artery stents remain patent. Outflow: Postsurgical changes around the left common femoral artery. Left common femoral artery is patent. Left profunda femoral arteries are patent. Left SFA is patent with mild areas of narrowing. Stable appearance of the left SFA. Left popliteal artery is patent Runoff: Again noted is narrowing at the origin of the left anterior tibial artery. Left anterior tibial artery occludes in the proximal calf and there may be some areas of segmental reconstitution. Tibioperoneal trunk is patent. Peroneal artery is patent and there may be improved flow in the peroneal artery compared to the previous examination. Previously, there was a segment of the peroneal artery that was occluded. Posterior tibial artery occludes in the proximal calf. Evidence for segmental reconstitution of the posterior tibial artery. Veins: No obvious venous abnormality within the limitations of this arterial phase study. Review of the MIP images confirms the above findings. NON-VASCULAR Lower chest: Lung bases are clear without pleural effusions. Hepatobiliary: Gallbladder is decompressed. Main portal veins are patent. Mild dilatation of the proximal extrahepatic bile duct measuring up to 1.3 cm and unchanged. Distal common bile duct  measures roughly 0.5 cm and similar to the previous examination. Pancreas: Unremarkable. No pancreatic ductal dilatation or surrounding inflammatory changes. Spleen: Normal in size without focal abnormality. Adrenals/Urinary Tract: Normal appearance of the adrenal glands. Negative for hydronephrosis. Probable small renal cysts. Urinary bladder is unremarkable. Stomach/Bowel: Stomach is within normal limits. Appendix appears normal. No evidence of bowel wall thickening, distention, or inflammatory changes. Lymphatic: No significant lymph node enlargement in the abdomen or pelvis. Reproductive: Prostate is mildly prominent measuring 4.8 cm in transverse dimension. Other: Negative for ascites.  Negative for free air. Musculoskeletal: No acute bone abnormality. IMPRESSION: VASCULAR 1. Right limb of the bifurcated aortic stent graft is occluded. This is a new finding and consistent with patient's right leg symptoms. 2. Concern for filling defects in the left atrium. This could represent left atrial thrombus and source for embolic disease. 3. Right runoff has slightly changed since 01/14/2020. There is now single-vessel runoff from the peroneal artery. Previously, there was flow at the ankle from the  posterior tibial artery. The lack of flow in the distal posterior tibial artery could be related to slow flow from the occluded stent limb but cannot exclude embolic disease involving the right runoff vessels. 4. No significant change in the right outflow disease. Right SFA and right popliteal artery remain patent. There continues to be focal narrowing in the right popliteal artery possibly related to a dissection. 5. Left iliac artery stents remain patent. No significant change in the left outflow or runoff vessels. NON-VASCULAR 1. No acute abnormality in the abdomen or pelvis. These results were called by telephone at the time of interpretation on 03/06/2020 at 3:30 pm to provider Monica Martinez, MD, who verbally  acknowledged these results. Electronically Signed   By: Markus Daft M.D.   On: 03/06/2020 16:13   CT CORONARY MORPH W/CTA COR W/SCORE W/CA W/CM &/OR WO/CM  Addendum Date: 03/13/2020   ADDENDUM REPORT: 03/13/2020 09:16 EXAM: OVER-READ INTERPRETATION  CT CHEST The following report is an over-read performed by radiologist Dr. Norlene Duel [facility] on 03/13/2020. This over-read does not include interpretation of cardiac or coronary anatomy or pathology. The coronary CTA and coronary calcium score interpretation by the cardiologist is attached. COMPARISON:  CTA 01/16/2020 FINDINGS: Vascular: Normal heart size.  No pericardial effusion identified. Mediastinum/nodes: Dilated and patulous esophagus. Lungs/pleura: There is a large, left upper lobe, perihilar lung mass which measures 8.9 x 5.5 cm, image 3/11. This mass encases the left hilum and occludes the left upper lobe and lingular bronchi with signs of postobstructive pneumonitis in the left upper lobe. The mass invades the left upper lobe pulmonary vein with tumor thrombus identified in the left atrium. The tumor thrombus protrudes through the mitral valve into the left ventricle. Upper abdomen: No acute abnormality. Musculoskeletal: No acute abnormality. IMPRESSION: 1. 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 This encases the left hilum and occludes the left upper lobe and lingular bronchi with signs of postobstructive pneumonitis in the left upper lobe. The mass invades the left upper lobe pulmonary vein with tumor thrombus in the left atrium, extending through the mitral valve. Recommend further evaluation with PET-CT. Electronically Signed   By: Kerby Moors M.D.   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 EXAM:  Cardiac/Coronary  CTA TECHNIQUE: The patient was scanned on a [employer]. FINDINGS: A 100 kV prospective scan was triggered in the descending thoracic aorta at 111 HU's. Axial non-contrast 3 mm slices were carried out through the heart. The data set was analyzed on a dedicated work station and scored using the [employer]. Gantry rotation speed was 250 msecs and collimation was .6 mm. No beta blockade and 0.8 mg of sl NTG was given. The 3D data set was reconstructed in 5% intervals of the 67-82 % of the R-R cycle. Diastolic phases were analyzed on a dedicated work station using MPR, MIP and VRT modes. The patient received 80 cc of contrast. Aorta: Normal size. There is moderate to severe diffuse atherosclerotic plaque with mild calcifications. No dissection. Aortic Valve:  Trileaflet.  No calcifications. Coronary Arteries: Normal coronary origin. Right dominance. Coronary artery evaluation is affected by significant motion and poor vasodilation and therefore not interpretable. There are two stents in the proximal to mid RCA. 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 The thrombus is highly mobile with high risk of embolization. It is partially protruding into the left ventricle across the mitral valve and partially obstructing the flow to the left ventricle. The thrombus is only partially extending into the left atrial appendage. Dilated pulmonary artery suggestive of pulmonary hypertension. IMPRESSION: 1. Coronary Arteries: Normal coronary origin. Right dominance. Coronary artery evaluation is affected by significant motion and poor vasodilation and therefore not interpretable. There are two stents in the proximal to mid RCA. 2. There is moderate to severe diffuse atherosclerotic plaque with mild calcifications. No dissection. 3. 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 The thrombus is highly mobile with high risk of embolization. It is partially protruding into the left ventricle across the mitral valve and partially obstructing the flow to the left ventricle. The thrombus is only partially extending into the left atrial appendage. 4. Dilated pulmonary artery suggestive of pulmonary hypertension. Electronically Signed: By: Ena Dawley On: 03/12/2020 18:57   CT CORONARY FRACTIONAL FLOW RESERVE DATA PREP  Result Date: 03/21/2020 EXAM: CT FFR ANALYSIS CLINICAL DATA:  74 year old male with abnormal CCTA. FINDINGS: FFRct analysis was performed on the original cardiac CT angiogram dataset. Diagrammatic representation of the FFRct analysis is provided in a separate PDF document in PACS. This dictation was created using the PDF document and an interactive 3D model of the results. 3D model is not available in the EMR/PACS. Normal FFR range is >0.80. 1. Left Main: 0.98. 2. LAD: Proximal: 0.96, mid: 0.90, distal (small lumen): 0.76. 3. LCX: 0.88. 4. OM1: 0.86 5. RCA: FFR analysis unavailable due to the presence of a metallic stent. IMPRESSION: 1. CT FFR analysis didn't show any significant stenosis in the left main, proximal or mid LAD or LCX arteries. CT FFR analysis unavailable for RCA due to the presence of a metallic stent. Electronically Signed   By: Ena Dawley   On: 03/21/2020 12:23   DG C-Arm 1-60 Min  Result Date: 03/06/2020 CLINICAL DATA:  Angiogram EXAM: DG C-ARM 1-60 MIN FLUOROSCOPY TIME:  Fluoroscopy Time:  3 minutes and 28 seconds Radiation Exposure Index (if provided by the fluoroscopic device): 74.67 mGy Number of Acquired Spot Images: 6 COMPARISON:  March 06, 2020 CT FINDINGS: The patient has undergone abdominal aortic angiogram. An endograft is noted. The left limb of the graft appears to be patent. Several images  demonstrate non patency of the right limb. IMPRESSION: The patient has undergone angiogram. Please see separate operative report for complete intraoperative details. Electronically Signed   By: Constance Holster M.D.   On: 03/06/2020 19:50   ECHO INTRAOPERATIVE TEE  Result Date: 03/11/2020  *INTRAOPERATIVE TRANSESOPHAGEAL REPORT *  Patient Name:   GRYFFIN ALTICE  Date of Exam: 03/11/2020 Medical Rec #:  [id number]      Height:       75.0 in Accession #:    [id number]     Weight:       194.0 lb Date of Birth:  03-08-1946       BSA:          2.17 m Patient Age:    50 years       BP:  137/74 mmHg Patient Gender: M              HR:           75 bpm. Exam Location:  Anesthesiology Transesophogeal exam was perform intraoperatively during surgical procedure. Patient was closely monitored under general anesthesia during the entirety of examination. Indications:     left atrial thrombus Sonographer:     Jannett Celestine RDCS (AE) Performing Phys: Annye Asa MD Diagnosing Phys: Annye Asa MD Report CC'd to:  Servando Snare MD Complications: No known complications during this procedure. PRE-OP FINDINGS  Left Ventricle: The left ventricle has low normal systolic function, with an ejection fraction of 50-55% 52%. The cavity size was normal. There is no increase in left ventricular wall thickness. No evidence of left ventricular regional wall motion abnormalities. Right Ventricle: The right ventricle has normal systolic function. The cavity was normal. There is no increase in right ventricular wall thickness. Left Atrium: Left atrial size was mildly dilated. There is a very large, lobulated, mobile thrombus present in the L atrium. The atrial portion measures 2.58 x3.04 cm, however, the mass appears to extend well into the L superior pulmonary vein. It most likely obstructs flow from the L superior pulmonary vein. The thrombus does traverse the mitral apparatus during diastole, but it does not seem to affect  forward flow. The left atrial appendage is well visualized and there is no evidence of thrombus present in it. Left atrial appendage velocity is normal at greater than 40 cm/s. Right Atrium: Right atrial size was normal in size. Right atrial pressure is estimated at 10 mmHg. Interatrial Septum: No atrial level shunt detected by color flow Doppler. Pericardium: There is no evidence of pericardial effusion. Mitral Valve: The mitral valve is normal in structure. No thickening of the mitral valve leaflet. No calcification of the mitral valve leaflet. Mitral valve regurgitation is not visualized by color flow Doppler. There is no evidence of mitral valve vegetation. There is no evidence of mitral stenosis. Tricuspid Valve: The tricuspid valve was normal in structure. Tricuspid valve regurgitation is trivial by color flow Doppler. There is no evidence of tricuspid valve vegetation. Aortic Valve: The aortic valve is tricuspid Aortic valve regurgitation was not visualized by color flow Doppler. There is no evidence of aortic valve stenosis. There is no evidence of a vegetation on the aortic valve. Pulmonic Valve: The pulmonic valve was normal in structure, with normal leaflet excursion. No evidence of pulmonic stenosis. Pulmonic valve regurgitation is trivial by color flow Doppler. Aorta: The aortic root, ascending aorta and aortic arch are normal in size and structure. There is evidence of scattered layered plaque in the descending aorta; Grade I, measuring 1-54mm in size. Pulmonary Artery: The pulmonary artery is of normal size. Venous: The inferior vena cava was not well visualized, but appears normal in size. +--------------+-------++ LEFT VENTRICLE        +--------------+-------++ PLAX 2D               +--------------+-------++ LVIDd:        4.13 cm +--------------+-------++ LVIDs:        3.03 cm +--------------+-------++ LV SV:        40 ml   +--------------+-------++ LV SV Index:  18.36    +--------------+-------++                       +--------------+-------++ +-------------+-----------++ AORTIC VALVE             +-------------+-----------++  AV Vmax:     143.00 cm/s +-------------+-----------++ AV Vmean:    94.100 cm/s +-------------+-----------++ AV VTI:      0.208 m     +-------------+-----------++ AV Peak Grad:8.2 mmHg    +-------------+-----------++ AV Mean Grad:4.0 mmHg    +-------------+-----------++ +-------------+---------++ MITRAL VALVE           +-------------+---------++ MV Peak grad:5.7 mmHg  +-------------+---------++ MV Mean grad:2.0 mmHg  +-------------+---------++ MV Vmax:     1.19 m/s  +-------------+---------++ MV Vmean:    68.8 cm/s +-------------+---------++ MV VTI:      0.32 m    +-------------+---------++  Annye Asa MD Electronically signed by Annye Asa MD Signature Date/Time: 03/11/2020/2:09:20 PM    Final    ECHO INTRAOPERATIVE TEE  Result Date: 03/07/2020  *INTRAOPERATIVE TRANSESOPHAGEAL REPORT *  Patient Name:   Ej Bedore Date of Exam: 03/06/2020 Medical Rec #:  [id number]     Height:       75.0 in Accession #:    [id number]    Weight:       174.2 lb Date of Birth:  Aug 30, 1945      BSA:          2.07 m Patient Age:    64 years      BP:           113/72 mmHg Patient Gender: M             HR:           83 bpm. Exam Location:  Inpatient Transesophogeal exam was perform intraoperatively during surgical procedure. Patient was closely monitored under general anesthesia during the entirety of examination. Indications:     Left atrial thrombus Performing Phys: [id number] Gwenyth Allegra CLARK Diagnosing Phys: Albertha Ghee MD Complications: No known complications during this procedure. POST-OP IMPRESSIONS Overall, there were no significant changes from pre-bypass. PRE-OP FINDINGS  Left Ventricle: The left ventricle has normal systolic function, with an ejection fraction of 55-60%. The cavity size was normal. There is  mild concentric left ventricular hypertrophy. No evidence of left ventricular regional wall motion abnormalities. Right Ventricle: The right ventricle has normal systolic function. The cavity was normal. There is no increase in right ventricular wall thickness. Left Atrium: Left atrial size was normal in size. There is a large, mobile, lobulated mass in the LA. The echogenic character is largely homogenous. The edges are smooth and rounded. This is likely a thrombus. It measures approximately 10cm x 5cm. It appears anchored deep within the LUPV and the remaining portion is freely mobile. The most distal portion of the mass extends through the mitral valve during diastole. No significant inflow obstruction is seen. The left atrial appendage is well visualized and there is no evidence of thrombus present. Right Atrium: Right atrial size was normal in size. Right atrial pressure is estimated at 10 mmHg. Interatrial Septum: No atrial level shunt detected by color flow Doppler. Pericardium: There is no evidence of pericardial effusion. Mitral Valve: The mitral valve is normal in structure. Mitral valve regurgitation is trivial by color flow Doppler. Tricuspid Valve: The tricuspid valve was normal in structure. Tricuspid valve regurgitation is trivial by color flow Doppler. Aortic Valve: The aortic valve is normal in structure. Aortic valve regurgitation was not visualized by color flow Doppler. There is no evidence of aortic valve stenosis. Pulmonic Valve: The pulmonic valve was normal in structure, with normal. Pulmonic valve regurgitation is not visualized by color flow Doppler. Aorta: The ascending aorta and aortic root are  normal in size and structure. There is evidence of atheroma immobile plaque in the descending aorta and aortic arch; Grade IV, measuring >67mm in size.  Albertha Ghee MD Electronically signed by Albertha Ghee MD Signature Date/Time: 03/07/2020/9:21:44 AM    Final    VAS US CAROTID  Result Date:  03/12/2020 Carotid Arterial Duplex Study Indications:       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 Risk Factors:      Hypertension, Diabetes, coronary artery disease. Comparison Study:  No prior studies. Performing Technologist: Darlin Coco  Examination Guidelines: A complete evaluation includes B-mode imaging, spectral Doppler, color Doppler, and power Doppler as needed of all accessible portions of each vessel. Bilateral testing is considered an integral part of a complete examination. Limited examinations for reoccurring indications may be performed as noted.  Right Carotid Findings: +----------+--------+--------+--------+-----------------------+--------+           PSV cm/sEDV cm/sStenosisPlaque Description     Comments +----------+--------+--------+--------+-----------------------+--------+ CCA Prox  63      12              heterogenous and smooth         +----------+--------+--------+--------+-----------------------+--------+ CCA Mid   100     21              heterogenous and smooth         +----------+--------+--------+--------+-----------------------+--------+ CCA Distal80      13              heterogenous and smooth         +----------+--------+--------+--------+-----------------------+--------+ ICA Prox  47      13      1-39%   heterogenous                    +----------+--------+--------+--------+-----------------------+--------+ ICA Distal52      20                                              +----------+--------+--------+--------+-----------------------+--------+ ECA       51      9                                               +----------+--------+--------+--------+-----------------------+--------+ +----------+--------+-------+----------------+-------------------+           PSV cm/sEDV cmsDescribe        Arm Pressure (mmHG) +----------+--------+-------+----------------+-------------------+ Subclavian               Multiphasic,  WNL                    +----------+--------+-------+----------------+-------------------+ +---------+--------+--+--------+--+---------+ VertebralPSV cm/s73EDV cm/s22Antegrade +---------+--------+--+--------+--+---------+  Left Carotid Findings: +----------+--------+--------+--------+-----------------------+--------+           PSV cm/sEDV cm/sStenosisPlaque Description     Comments +----------+--------+--------+--------+-----------------------+--------+ CCA Prox  97      20              heterogenous                    +----------+--------+--------+--------+-----------------------+--------+ CCA Mid   93      19              heterogenous and smooth         +----------+--------+--------+--------+-----------------------+--------+ CCA Distal104  25              heterogenous and smooth         +----------+--------+--------+--------+-----------------------+--------+ ICA Prox  59      10      1-39%   heterogenous                    +----------+--------+--------+--------+-----------------------+--------+ ICA Distal92      34                                              +----------+--------+--------+--------+-----------------------+--------+ ECA       97      13                                              +----------+--------+--------+--------+-----------------------+--------+ +----------+--------+--------+----------------+-------------------+           PSV cm/sEDV cm/sDescribe        Arm Pressure (mmHG) +----------+--------+--------+----------------+-------------------+ Subclavian110             Multiphasic, WNL                    +----------+--------+--------+----------------+-------------------+ +---------+--------+--+--------+--+---------+ VertebralPSV cm/s93EDV cm/s29Antegrade +---------+--------+--+--------+--+---------+   Summary: Right Carotid: Velocities in the right ICA are consistent with a 1-39% stenosis.                 Non-hemodynamically significant plaque <50% noted in the CCA. Left Carotid: Velocities in the left ICA are consistent with a 1-39% stenosis.               Non-hemodynamically significant plaque <50% noted in the CCA.  *See table(s) above for measurements and observations.  Electronically signed by Ruta Hinds MD on 03/12/2020 at 6:14:00 PM.    Final     Assessment/Plan  1. Advance care planning -Remains to be full code -Discussed medications, vital signs and weights  2. Occlusion of right femorotibial bypass graft, subsequent encounter -  S/P thrombectomy/4 compartment fasciotomy likely secondary to tumor embolization from large atrial thrombus/tumor due to a mass eroding into the mediastinum -  On 7/23 thrombectomy/thrombosed biopsy confirms malignancy -Continue Lovenox -CT surgery initially contemplated sternotomy and left removal but given diagnosis of malignancy, he is now deemed not an operative candidate.  Oncology recommended to continue Lovenox long-term as patient failed Eliquis.  3. Malignant neoplasm of hilus of left lung [facility]) -Patient confirmed that he was told has lung cancer sometime in 2019 at [facility] but he decided not to proceed with any treatment.  Records obtained from daughter/VA, on 7/26 Bronchoscopy with biopsy and had an extensive work-up and was diagnosed with invasive squamous cell carcinoma. -Follow-up with oncology  4. Thrombocytopenia ([place]) Lab Results  Component Value Date   PLT 128 (L) 03/19/2020   -HIT antibody negative and SRA negative -Continue Lovenox  5. AF (pa+roxysmal atrial fibrillation) ([facility]) -Rate controlled, continue Lovenox  6. Chronic obstructive pulmonary disease, unspecified COPD type ([place]) -Stable continue Incruse INH  7. DM (diabetes mellitus), secondary, uncontrolled, with peripheral vascular complications ([facility]) Lab Results  Component Value Date   HGBA1C 8.2 (H) 01/15/2020   -Stable, continue Metformin and Lantus  8. Benign  prostatic hyperplasia without lower urinary tract symptoms -Stable, continue tamsulosin     Family/  staff Communication:  Discussed plan of care with resident, wife and IDT.  Labs/tests ordered:  None  Goals of care:   Short-term care  Durenda Age, DNP, MSN, FNP-BC [facility] [phone] (Monday-Friday 8:00 a.m. - 5:00 p.m.) [phone] (after hours)

## 2020-03-27 ENCOUNTER — Encounter: Payer: Self-pay | Admitting: Adult Health

## 2020-03-27 ENCOUNTER — Non-Acute Institutional Stay (SKILLED_NURSING_FACILITY): Payer: Medicare PPO | Admitting: Adult Health

## 2020-03-27 DIAGNOSIS — C3402 Malignant neoplasm of left main bronchus: Secondary | ICD-10-CM

## 2020-03-27 DIAGNOSIS — I480 Paroxysmal atrial fibrillation: Secondary | ICD-10-CM

## 2020-03-27 DIAGNOSIS — E1365 Other specified diabetes mellitus with hyperglycemia: Secondary | ICD-10-CM | POA: Diagnosis not present

## 2020-03-27 DIAGNOSIS — IMO0002 Reserved for concepts with insufficient information to code with codable children: Secondary | ICD-10-CM

## 2020-03-27 DIAGNOSIS — E1351 Other specified diabetes mellitus with diabetic peripheral angiopathy without gangrene: Secondary | ICD-10-CM | POA: Diagnosis not present

## 2020-03-27 DIAGNOSIS — T82898D Other specified complication of vascular prosthetic devices, implants and grafts, subsequent encounter: Secondary | ICD-10-CM

## 2020-03-27 DIAGNOSIS — J449 Chronic obstructive pulmonary disease, unspecified: Secondary | ICD-10-CM | POA: Diagnosis not present

## 2020-03-27 NOTE — Progress Notes (Signed)
Location:  [facility] Room Number: 216-A Place of Service:  SNF (31) Provider:  Durenda Age, DNP, FNP-BC  Patient Care Team: [name] as PCP - General ([place]) [name], MD as PCP - Cardiology (Cardiology) Patient, No Pcp Per (General Practice)  Extended Emergency Contact Information Primary Emergency Contact: [name] Address: [address]          [place], [place] [address] [place] Phone: [phone] Mobile Phone: [phone] Relation: Spouse  Code Status:  FULL CODE  Goals of care: Advanced Directive information Advanced Directives 03/07/2020  Does Patient Have a Medical Advance Directive? No  Would patient like information on creating a medical advance directive? No - Patient declined     Chief Complaint  Patient presents with  . Medical Management of Chronic Issues    Routine short-term rehabilitation visit    HPI:  Pt is a 74 y.o. male seen today for medical management of chronic diseases.  He is a short-term care resident of [name].  He has a PMH of essential hypertension, diabetes mellitus complicated by PVD, history of renal calculi, CAD and history of asthma. CBGs ranging from 90 to 271. He takes Metformin and Lantus for his diabetes mellitus.  He was admitted to [facility] on 03/19/20 post hospitalization 03/06/2020 to 03/19/2020.  He presented to the hospital with right ischemic leg and was found to have occluded right/left limb of the aortobiiliac endograft and now S/P emergent thrombectomy and RLE 4 compartment fasciotomy.  Further evaluation with intraoperative TEE showed a mobile left atrial thrombus.  Subsequent CT imaging study showed a large left now mass eroding into the mid the ascending into the pulmonary vein and into the left atrium.  Suspicion for tumor embolism causing ischemic leg.  In 2019, he was diagnosed with  squamous cell carcinoma of the lung at the [facility] for which he refused treatment.  Past Medical History:  Diagnosis Date  . Asthma   . Coronary artery disease   . Diabetes mellitus without complication ([place])   . History of kidney stones   . Hypertension   . Peripheral vascular disease [facility])    Past Surgical History:  Procedure Laterality Date  . AORTOGRAM  03/06/2020   Procedure: AORTOGRAM WITH BILATERAL ILIAC ARTERIOGRAM;  Surgeon: Marty Heck, MD;  Location: [place];  Service: Vascular;;  . CARDIAC CATHETERIZATION N/A 07/05/2016   Procedure: Left Heart Cath and Coronary Angiography;  Surgeon: Sherren Mocha, MD;  Location: [place] CV LAB;  Service: Cardiovascular;  Laterality: N/A;  . CORONARY ANGIOPLASTY    . EMBOLECTOMY Bilateral 03/06/2020   Procedure: THROMBECTOMY OF RIGHT LIMB OF BIFURCATED AORTO-BI-ILIAC STENT GRAFT; RIGHT LOWER EXTREMITY THROMBECTOMY USING THE FEMORAL APPROACH; REDO EXPOSURE LEFT COMMON FEMORAL ARTERY; THROMBECTOMY LEFT LIMB OF AORTO-BI-ILIAC STENT GRAFT;  LEFT LOWER EXTREMITY THROMBECTOMY USING THE FEMORAL APPROACH;  Surgeon: Marty Heck, MD;  Location: [place];  Service: Vascular;  Laterality: Bilateral;  . ENDOVASCULAR STENT INSERTION N/A 01/15/2020   Procedure: ENDOVASCULAR ANEURYSM REPAIR;  Surgeon: Waynetta Sandy, MD;  Location: [place];  Service: Vascular;  Laterality: N/A;  . FASCIOTOMY Right 03/06/2020   Procedure: RIGHT LOWER EXTREMITY FOUR COMPARTMENT FASCIOTOMIES;  Surgeon: Marty Heck, MD;  Location: [place];  Service: Vascular;  Laterality: Right;  . FASCIOTOMY CLOSURE Right 03/11/2020   Procedure: RIGHT LOWER EXTREMITY FASCIOTOMY WASHOUT AND CLOSURE;  Surgeon: Waynetta Sandy, MD;  Location: [place];  Service: Vascular;  Laterality: Right;  . heart stents  2006  . INSERTION OF ILIAC STENT Left 01/15/2020   Procedure: INSERTION OF LEFT EXTERNAL ILIAC ARTERY STENT;  Surgeon: Waynetta Sandy, MD;  Location: [place];  Service: Vascular;  Laterality: Left;  . LOWER EXTREMITY ANGIOGRAM  01/15/2020   Procedure: Left Lower Extremity Angiogram;  Surgeon: Waynetta Sandy, MD;  Location: [place];  Service: Vascular;;  . TEE WITHOUT CARDIOVERSION  03/11/2020   Procedure: TRANSESOPHAGEAL ECHOCARDIOGRAM (TEE);  Surgeon: Waynetta Sandy, MD;  Location: [place];  Service: Vascular;;  . THROMBECTOMY ILIAC ARTERY  01/15/2020   Procedure: THROMBECTOMY OF LEFT ILIAC ARTERY;  Surgeon: Waynetta Sandy, MD;  Location: [place];  Service: Vascular;;    Allergies  Allergen Reactions  . Erythromycin Shortness Of Breath and Swelling  . Simvastatin Other (See Comments)    CAUSE MUSCLE PAIN,.  . Statins Other (See Comments)    Muscle pain    Outpatient Encounter Medications as of 03/27/2020  Medication Sig  . acetaminophen (TYLENOL) 325 MG tablet Take 2 tablets (650 mg total) by mouth every 6 (six) hours as needed for mild pain (or temp >/= 101 F).  Marland Kitchen albuterol (PROVENTIL HFA;VENTOLIN HFA) 108 (90 BASE) MCG/ACT inhaler Inhale 2 puffs into the lungs every 6 (six) hours as needed for wheezing or shortness of breath.   . Calcium Carb-Cholecalciferol ([employer] CALCIUM 600 & VITAMIN D3) 600-800 MG-UNIT TABS Take 1 tablet by mouth daily.  . Calcium Carbonate-Simethicone (ALKA-SELTZER HEARTBURN + GAS PO) Take 2 each by mouth every 4 (four) hours as needed.  . clopidogrel (PLAVIX) 75 MG tablet Take 75 mg by mouth daily.  . Nutritional Supplement LIQD Take 120 mLs by mouth daily. MedPass  . Cyanocobalamin (VITAMIN B 12 PO) Take 500 mcg by mouth daily.   . diclofenac sodium (VOLTAREN) 1 % GEL Apply 4 g topically 3 (three) times daily as needed (pain).   Marland Kitchen enoxaparin (LOVENOX) 80 MG/0.8ML injection Inject 0.8 mLs (80 mg total) into the skin every 12 (twelve) hours.  . finasteride (PROSCAR) 5 MG tablet Take 5 mg by mouth daily.  . hydroxypropyl methylcellulose / hypromellose (ISOPTO TEARS / GONIOVISC) 2.5 % ophthalmic  solution Place 1-2 drops into both eyes in the morning and at bedtime.   . insulin glargine (LANTUS SOLOSTAR) 100 UNIT/ML Solostar Pen Inject 12 Units into the skin daily.  Marland Kitchen loratadine (CLARITIN) 10 MG tablet Take 10 mg by mouth daily.  . metFORMIN (GLUCOPHAGE-XR) 500 MG 24 hr tablet Take 1,000 mg by mouth in the morning and at bedtime.   . nitroGLYCERIN (NITROSTAT) 0.4 MG SL tablet Place 0.4 mg under the tongue every 5 (five) minutes as needed for chest pain.  Marland Kitchen Nystatin (GERHARDT'S BUTT CREAM) CREA Apply 1 application topically 3 (three) times daily. Apply in a thin layer to affected area in the gluteal cleft three times daily and PRN after defecation and cleansing.  . Olodaterol HCl 2.5 MCG/ACT AERS Inhale 2 puffs into the lungs daily.  Marland Kitchen oxyCODONE (OXY IR/ROXICODONE) 5 MG immediate release tablet Take 1 tablet (5 mg total) by mouth every 6 (six) hours as needed for moderate pain.  . pantoprazole (PROTONIX) 40 MG tablet Take 1 tablet (40 mg total) by mouth daily.  . polyethylene glycol (MIRALAX / GLYCOLAX) 17 g packet Take 17 g by mouth daily as needed for moderate constipation.  . tamsulosin (FLOMAX) 0.4 MG CAPS capsule Take 0.4 mg by mouth at bedtime.   Marland Kitchen  umeclidinium bromide (INCRUSE ELLIPTA) 62.5 MCG/INH AEPB Inhale 1 puff into the lungs daily.   No facility-administered encounter medications on file as of 03/27/2020.    Review of Systems  GENERAL: No fever or chills MOUTH and THROAT: Denies oral discomfort, gingival pain or bleeding RESPIRATORY: no cough, wheezing, hemoptysis CARDIAC: No chest pain, edema or palpitations GI: No abdominal pain, diarrhea, constipation, heart burn, nausea or vomiting GU: Denies dysuria, frequency, hematuria, incontinence, or discharge NEUROLOGICAL: Denies dizziness, syncope, numbness, or headache PSYCHIATRIC: Denies feelings of depression or anxiety. No report of hallucinations, insomnia, paranoia, or agitation   Immunization History  Administered  Date(s) Administered  . PFIZER SARS-COV-2 Vaccination 12/06/2019, 12/27/2019   Pertinent  Health Maintenance Due  Topic Date Due  . OPHTHALMOLOGY EXAM  Never done  . URINE MICROALBUMIN  Never done  . COLONOSCOPY  Never done  . PNA vac Low Risk Adult (1 of 2 - PCV13) Never done  . INFLUENZA VACCINE  03/23/2020  . HEMOGLOBIN A1C  07/17/2020  . FOOT EXAM  11/13/2020    Vitals:   03/27/20 1509  BP: 132/86  Pulse: 79  Resp: 17  Temp: (!) 97.2 F (36.2 C)  TempSrc: Oral  Weight: 173 lb 3.2 oz (78.6 kg)  Height: 6\' 3"  (1.905 m)   Body mass index is 21.65 kg/m.  Physical Exam  GENERAL APPEARANCE: Well nourished. Normal body habitus SKIN: Bilateral inguinal surgical wounds dry, no erythema; right lower leg surgical wound with staples, covered with dressing MOUTH and THROAT: Lips are without lesions. Oral mucosa is moist and without lesions. Tongue is normal in shape, size, and color and without lesions RESPIRATORY: slight SOB on room air & unlabored, BS CTAB CARDIAC: RRR, no murmur,no extra heart sounds, no edema GI: Abdomen soft, normal BS, no masses, no tenderness NEUROLOGICAL: There is no tremor. Speech is clear. Alert and oriented X 3. PSYCHIATRIC:  Affect and behavior are appropriate  Labs reviewed: Recent Labs    03/10/20 0234 03/10/20 0234 03/11/20 5638 03/11/20 0318 03/12/20 0251 03/12/20 0251 03/13/20 0304 03/15/20 0246 03/17/20 0152  NA 134*   < > 135   < > 136   < > 135 137 135  K 4.3   < > 3.7   < > 4.0   < > 3.6 4.0 3.9  CL 100   < > 102   < > 103   < > 102 104 102  CO2 27   < > 24   < > 24   < > 23 23 24   GLUCOSE 161*   < > 158*   < > 155*   < > 134* 142* 133*  BUN 12   < > 11   < > 12   < > 10 10 11   CREATININE 0.95   < > 0.93   < > 1.06   < > 0.99 0.92 1.08  CALCIUM 8.8*   < > 8.8*   < > 9.0   < > 8.8* 9.0 9.3  MG 1.9  --  2.0  --  1.8  --   --   --   --   PHOS 2.7   < > 2.7  --  2.9  --  2.5  --   --    < > = values in this interval not displayed.     Recent Labs    12/17/19 1312 01/16/20 1652 03/11/20 0318  AST 19 23 38  ALT 12 12 22   ALKPHOS 55  42 50  BILITOT 0.8 0.7 1.8*  PROT 6.8 5.0* 5.9*  ALBUMIN 3.7 2.6* 2.9*   Recent Labs    12/17/19 1312 12/17/19 1413 01/16/20 1652 01/17/20 0856 03/06/20 1417 03/06/20 1432 03/16/20 0244 03/17/20 0152 03/19/20 0119  WBC 6.8   < > 7.5   < > 9.5   < > 8.3 9.3 10.2  NEUTROABS 3.3  --  5.1  --  6.6  --   --   --   --   HGB 13.8   < > 10.4*   < > 10.9*   < > 9.0* 9.4* 9.5*  HCT 44.7   < > 32.4*   < > 35.8*   < > 28.5* 30.2* 29.2*  MCV 92.0   < > 89.5   < > 89.7   < > 87.2 87.0 86.9  PLT 152   < > 131*   < > 51*   < > 160 149* 128*   < > = values in this interval not displayed.   Lab Results  Component Value Date   TSH 1.786 07/06/2016   Lab Results  Component Value Date   HGBA1C 8.2 (H) 01/15/2020   Lab Results  Component Value Date   CHOL 124 03/07/2020   HDL 23 (L) 03/07/2020   LDLCALC 87 03/07/2020   TRIG 72 03/07/2020   CHOLHDL 5.4 03/07/2020    Significant Diagnostic Results in last 30 days:  DG Abd 1 View  Result Date: 03/06/2020 CLINICAL DATA:  Arteriogram. EXAM: ABDOMEN - 1 VIEW COMPARISON:  March 06, 2020 FINDINGS: The patient has undergone abdominal aortic angiogram. An endograft is noted. The left limb of the graft appears to be patent. Several images demonstrate non patency of the right limb. IMPRESSION: The patient has undergone abdominal aortic angiogram. Please see separate operative report for complete details. Electronically Signed   By: Constance Holster M.D.   On: 03/06/2020 19:49   CT CHEST W CONTRAST  Addendum Date: 03/14/2020   ADDENDUM REPORT: 03/14/2020 13:45 ADDENDUM: Thoracic surgeon Dr. Servando Snare called for a second opinion regarding the location of the cardiac mass. The irregular soft tissue mass within the left atrial appendage, left superior pulmonary vein and left aspect of the left atrium extending to mitral valve and potentially to the  entrance of the left ventricle is contiguous with the large central left upper lobe lung mass and is favored to represent direct tumor invasion of these structures. While a component of bland thrombus is possible at the periphery of this cardiac mass, this is felt to largely represent direct cardiac tumor invasion. These results were discussed by telephone by Dr. Polly Cobia at 03/14/2020 at 1:44 pm with Dr. Servando Snare, who verbally acknowledged these results. Electronically Signed   By: Ilona Sorrel M.D.   On: 03/14/2020 13:45   Result Date: 03/14/2020 CLINICAL DATA:  Abnormal x-ray findings. EXAM: CT CHEST WITH CONTRAST TECHNIQUE: Multidetector CT imaging of the chest was performed during intravenous contrast administration. CONTRAST:  19mL OMNIPAQUE IOHEXOL 300 MG/ML  SOLN COMPARISON:  March 12, 2020 FINDINGS: Cardiovascular: The pulmonary arteries are limited in evaluation secondary to suboptimal opacification with intravenous contrast. Normal heart size. A 3.6 cm x 2.4 cm area of low attenuation is seen within the anterior aspect of the right atrium. This extends from a large left suprahilar mass. Mild involvement of the left atrium is noted. No pericardial effusion. Mediastinum/Nodes: No enlarged mediastinal, hilar, or axillary lymph nodes. Thyroid gland, trachea, and esophagus demonstrate no significant  findings. Lungs/Pleura: There is a 10.5 cm x 5.7 cm lobulated soft tissue mass seen along the left hilar and left suprahilar regions. There is no evidence of acute infiltrate, pleural effusion or pneumothorax. Upper Abdomen: There is evidence of prior stenting of the infrarenal abdominal aorta. Musculoskeletal: No chest wall abnormality. No acute or significant osseous findings. IMPRESSION: 1. 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 2. 3.6 cm x 2.4 cm area of low attenuation within the anterior aspect of the right atrium and adjacent portion of the left ventricle. While  this is likely consistent with vascular invasion of the previously noted lung mass, a component of associated thrombus cannot be excluded. 3. Evidence of prior stenting of the infrarenal abdominal aorta. Electronically Signed: By: Virgina Norfolk M.D. On: 03/13/2020 19:05   CT ANGIO AO+BIFEM W & OR WO CONTRAST  Result Date: 03/06/2020 CLINICAL DATA:  74 year old with sudden onset of right leg weakness. Abdominal aortic aneurysm repair 1 month ago. EXAM: CT ANGIOGRAPHY OF ABDOMINAL AORTA WITH ILIOFEMORAL RUNOFF TECHNIQUE: Multidetector CT imaging of the abdomen, pelvis and lower extremities was performed using the standard protocol during bolus administration of intravenous contrast. Multiplanar CT image reconstructions and MIPs were obtained to evaluate the vascular anatomy. CONTRAST:  127mL OMNIPAQUE IOHEXOL 350 MG/ML SOLN COMPARISON:  CTA 01/16/2020 and 01/14/2020 FINDINGS: VASCULAR Heart: There is concern for filling defects in the left atrium that could represent thrombus. This area is incompletely evaluated. This area of concern is seen on sequence 5, image 1. Aorta: Normal appearance of the distal descending thoracic aorta. Again noted is an aortic stent graft in the infrarenal aorta. Main body of the stent graft is patent but the right limb of the stent graft is occluded. Left limb of the graft remains patent. The abdominal aortic aneurysm sac measures 4.8 x 4.4 cm and previously measured 5.1 x 4.5 cm. No obvious endoleak on this arterial phase of imaging. No stranding or fluid around the aneurysm sac. Celiac: Celiac trunk and main branch vessels are patent. SMA: Mild atherosclerotic disease at the origin without significant stenosis. Main branches of the SMA are patent. Renals: Single bilateral renal arteries are patent. IMA: Origin of the IMA is occluded due to the aneurysm sac. Evidence for reconstitution beyond the origin. RIGHT Lower Extremity Inflow: Right limb of the aortic stent graft is  occluded. This represents a new finding. Chronic stenosis at the origin of the right internal iliac artery. Right external iliac artery is patent. Outflow: Flow postsurgical changes in the right groin. The right common femoral artery has atherosclerotic disease but patent. Right profunda femoral artery is patent. Right SFA is patent with areas of at least mild narrowing. Again noted is a short segment narrowing in the distal right SFA measuring close to 50% stenosis. These findings are unchanged. Right popliteal artery is patent. Again noted is focal narrowing in the right popliteal artery just above the knee joint and this may be related to a chronic dissection in this area. Degree of stenosis appears to be greater than 50% at the popliteal artery dissection. Runoff: Primary runoff is the peroneal artery which is similar to the previous examination. Small amount of flow in the proximal anterior tibial artery and there may be decreased flow along the anterior tibial artery collateral distribution. This may be related to a small amount of thrombus or slow flow. Posterior tibial occludes in the mid calf and this is similar to the previous examination. Previously,  there was distal reconstitution of the posterior tibial artery which is not clearly identified on this examination. LEFT Lower Extremity Inflow: Left aortic stent graft limb is patent. Left common and external iliac artery stents remain patent. Outflow: Postsurgical changes around the left common femoral artery. Left common femoral artery is patent. Left profunda femoral arteries are patent. Left SFA is patent with mild areas of narrowing. Stable appearance of the left SFA. Left popliteal artery is patent Runoff: Again noted is narrowing at the origin of the left anterior tibial artery. Left anterior tibial artery occludes in the proximal calf and there may be some areas of segmental reconstitution. Tibioperoneal trunk is patent. Peroneal artery is patent and  there may be improved flow in the peroneal artery compared to the previous examination. Previously, there was a segment of the peroneal artery that was occluded. Posterior tibial artery occludes in the proximal calf. Evidence for segmental reconstitution of the posterior tibial artery. Veins: No obvious venous abnormality within the limitations of this arterial phase study. Review of the MIP images confirms the above findings. NON-VASCULAR Lower chest: Lung bases are clear without pleural effusions. Hepatobiliary: Gallbladder is decompressed. Main portal veins are patent. Mild dilatation of the proximal extrahepatic bile duct measuring up to 1.3 cm and unchanged. Distal common bile duct measures roughly 0.5 cm and similar to the previous examination. Pancreas: Unremarkable. No pancreatic ductal dilatation or surrounding inflammatory changes. Spleen: Normal in size without focal abnormality. Adrenals/Urinary Tract: Normal appearance of the adrenal glands. Negative for hydronephrosis. Probable small renal cysts. Urinary bladder is unremarkable. Stomach/Bowel: Stomach is within normal limits. Appendix appears normal. No evidence of bowel wall thickening, distention, or inflammatory changes. Lymphatic: No significant lymph node enlargement in the abdomen or pelvis. Reproductive: Prostate is mildly prominent measuring 4.8 cm in transverse dimension. Other: Negative for ascites.  Negative for free air. Musculoskeletal: No acute bone abnormality. IMPRESSION: VASCULAR 1. Right limb of the bifurcated aortic stent graft is occluded. This is a new finding and consistent with patient's right leg symptoms. 2. Concern for filling defects in the left atrium. This could represent left atrial thrombus and source for embolic disease. 3. Right runoff has slightly changed since 01/14/2020. There is now single-vessel runoff from the peroneal artery. Previously, there was flow at the ankle from the posterior tibial artery. The lack of  flow in the distal posterior tibial artery could be related to slow flow from the occluded stent limb but cannot exclude embolic disease involving the right runoff vessels. 4. No significant change in the right outflow disease. Right SFA and right popliteal artery remain patent. There continues to be focal narrowing in the right popliteal artery possibly related to a dissection. 5. Left iliac artery stents remain patent. No significant change in the left outflow or runoff vessels. NON-VASCULAR 1. No acute abnormality in the abdomen or pelvis. These results were called by telephone at the time of interpretation on 03/06/2020 at 3:30 pm to provider Monica Martinez, MD, who verbally acknowledged these results. Electronically Signed   By: Markus Daft M.D.   On: 03/06/2020 16:13   CT CORONARY MORPH W/CTA COR W/SCORE W/CA W/CM &/OR WO/CM  Addendum Date: 03/13/2020   ADDENDUM REPORT: 03/13/2020 09:16 EXAM: OVER-READ INTERPRETATION  CT CHEST The following report is an over-read performed by radiologist Dr. Norlene Duel [facility] on 03/13/2020. This over-read does not include interpretation of cardiac or coronary anatomy or pathology. The coronary CTA and coronary calcium score interpretation by the cardiologist is  attached. COMPARISON:  CTA 01/16/2020 FINDINGS: Vascular: Normal heart size.  No pericardial effusion identified. Mediastinum/nodes: Dilated and patulous esophagus. Lungs/pleura: There is a large, left upper lobe, perihilar lung mass which measures 8.9 x 5.5 cm, image 3/11. This mass encases the left hilum and occludes the left upper lobe and lingular bronchi with signs of postobstructive pneumonitis in the left upper lobe. The mass invades the left upper lobe pulmonary vein with tumor thrombus identified in the left atrium. The tumor thrombus protrudes through the mitral valve into the left ventricle. Upper abdomen: No acute abnormality. Musculoskeletal: No acute abnormality. IMPRESSION: 1.  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 This encases the left hilum and occludes the left upper lobe and lingular bronchi with signs of postobstructive pneumonitis in the left upper lobe. The mass invades the left upper lobe pulmonary vein with tumor thrombus in the left atrium, extending through the mitral valve. Recommend further evaluation with PET-CT. Electronically Signed   By: Kerby Moors M.D.   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 EXAM: Cardiac/Coronary  CTA TECHNIQUE: The patient was scanned on a [employer]. FINDINGS: A 100 kV prospective scan was triggered in the descending thoracic aorta at 111 HU's. Axial non-contrast 3 mm slices were carried out through the heart. The data set was analyzed on a dedicated work station and scored using the [place]. Gantry rotation speed was 250 msecs and collimation was .6 mm. No beta blockade and 0.8 mg of sl NTG was given. The 3D data set was reconstructed in 5% intervals of the 67-82 % of the R-R cycle. Diastolic phases were analyzed on a dedicated work station using MPR, MIP and VRT modes. The patient received 80 cc of contrast. Aorta: Normal size. There is moderate to severe diffuse atherosclerotic plaque with mild calcifications. No dissection. Aortic Valve:  Trileaflet.  No calcifications. Coronary Arteries: Normal coronary origin. Right dominance. Coronary artery evaluation is affected by significant motion and poor vasodilation and therefore not interpretable. There are two stents in the proximal to mid RCA. 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 The thrombus is highly mobile with high  risk of embolization. It is partially protruding into the left ventricle across the mitral valve and partially obstructing the flow to the left ventricle. The thrombus is only partially extending into the left atrial appendage. Dilated pulmonary artery suggestive of pulmonary hypertension. IMPRESSION: 1. Coronary Arteries: Normal coronary origin. Right dominance. Coronary artery evaluation is affected by significant motion and poor vasodilation and therefore not interpretable. There are two stents in the proximal to mid RCA. 2. There is moderate to severe diffuse atherosclerotic plaque with mild calcifications. No dissection. 3. 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 The thrombus is highly mobile with high risk of embolization. It is partially protruding into the left ventricle across the mitral valve and partially obstructing the flow to the left ventricle. The thrombus is only partially extending into the left atrial appendage. 4. 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 Electronically Signed: By: Ena Dawley On: 03/12/2020 18:57   CT CORONARY FRACTIONAL FLOW RESERVE DATA PREP  Result Date: 03/21/2020 EXAM: CT FFR ANALYSIS CLINICAL DATA:  74 year old male with abnormal CCTA. FINDINGS: FFRct analysis was performed on the original cardiac CT angiogram dataset. Diagrammatic representation of the FFRct analysis is provided in a separate PDF document in PACS. This dictation was created using the PDF document and an interactive 3D model of the results. 3D model is not available in the EMR/PACS. Normal FFR range is >0.80. 1. Left Main: 0.98. 2. LAD: Proximal: 0.96, mid: 0.90, distal (small lumen): 0.76. 3. LCX: 0.88. 4. OM1: 0.86 5. RCA: FFR analysis unavailable due to the presence of a metallic stent. IMPRESSION: 1. CT FFR analysis didn't show any  significant stenosis in the left main, proximal or mid LAD or LCX arteries. CT FFR analysis unavailable for RCA due to the presence of a metallic stent. Electronically Signed   By: Ena Dawley   On: 03/21/2020 12:23   DG C-Arm 1-60 Min  Result Date: 03/06/2020 CLINICAL DATA:  Angiogram EXAM: DG C-ARM 1-60 MIN FLUOROSCOPY TIME:  Fluoroscopy Time:  3 minutes and 28 seconds Radiation Exposure Index (if provided by the fluoroscopic device): 74.67 mGy Number of Acquired Spot Images: 6 COMPARISON:  March 06, 2020 CT FINDINGS: The patient has undergone abdominal aortic angiogram. An endograft is noted. The left limb of the graft appears to be patent. Several images demonstrate non patency of the right limb. IMPRESSION: The patient has undergone angiogram. Please see separate operative report for complete intraoperative details. Electronically Signed   By: Constance Holster M.D.   On: 03/06/2020 19:50   ECHO INTRAOPERATIVE TEE  Result Date: 03/11/2020  *INTRAOPERATIVE TRANSESOPHAGEAL REPORT *  Patient Name:   [name]  Date of Exam: 03/11/2020 Medical Rec #:  [id number]      Height:       75.0 in Accession #:    [id number]     Weight:       194.0 lb Date of Birth:  May 03, 1946       BSA:          2.17 m Patient Age:    58 years       BP:           137/74 mmHg Patient Gender: M              HR:           75 bpm. Exam Location:  Anesthesiology Transesophogeal exam was perform intraoperatively during surgical procedure. Patient was closely monitored under general anesthesia during the entirety of examination. Indications:     left atrial thrombus Sonographer:     Jannett Celestine RDCS (AE) Performing Phys: Annye Asa MD Diagnosing Phys: Annye Asa MD Report CC'd to:  Servando Snare MD Complications: No known complications during this procedure. PRE-OP FINDINGS  Left Ventricle: The left ventricle has low normal systolic function, with an ejection fraction of 50-55% 52%. The cavity size was normal. There is  no increase in left ventricular wall thickness. No evidence of left ventricular regional wall motion abnormalities. Right Ventricle: The right ventricle has normal systolic function. The cavity was normal. There is no increase in right ventricular wall thickness. Left Atrium: Left atrial size was mildly dilated. There is a very large, lobulated, mobile thrombus present in the L atrium. The atrial portion measures 2.58 x3.04 cm, however, the mass appears to extend well into the L superior pulmonary vein.  It most likely obstructs flow from the L superior pulmonary vein. The thrombus does traverse the mitral apparatus during diastole, but it does not seem to affect forward flow. The left atrial appendage is well visualized and there is no evidence of thrombus present in it. Left atrial appendage velocity is normal at greater than 40 cm/s. Right Atrium: Right atrial size was normal in size. Right atrial pressure is estimated at 10 mmHg. Interatrial Septum: No atrial level shunt detected by color flow Doppler. Pericardium: There is no evidence of pericardial effusion. Mitral Valve: The mitral valve is normal in structure. No thickening of the mitral valve leaflet. No calcification of the mitral valve leaflet. Mitral valve regurgitation is not visualized by color flow Doppler. There is no evidence of mitral valve vegetation. There is no evidence of mitral stenosis. Tricuspid Valve: The tricuspid valve was normal in structure. Tricuspid valve regurgitation is trivial by color flow Doppler. There is no evidence of tricuspid valve vegetation. Aortic Valve: The aortic valve is tricuspid Aortic valve regurgitation was not visualized by color flow Doppler. There is no evidence of aortic valve stenosis. There is no evidence of a vegetation on the aortic valve. Pulmonic Valve: The pulmonic valve was normal in structure, with normal leaflet excursion. No evidence of pulmonic stenosis. Pulmonic valve regurgitation is trivial by  color flow Doppler. Aorta: The aortic root, ascending aorta and aortic arch are normal in size and structure. There is evidence of scattered layered plaque in the descending aorta; Grade I, measuring 1-30mm in size. Pulmonary Artery: The pulmonary artery is of normal size. Venous: The inferior vena cava was not well visualized, but appears normal in size. +--------------+-------++ LEFT VENTRICLE        +--------------+-------++ PLAX 2D               +--------------+-------++ LVIDd:        4.13 cm +--------------+-------++ LVIDs:        3.03 cm +--------------+-------++ LV SV:        40 ml   +--------------+-------++ LV SV Index:  18.36   +--------------+-------++                       +--------------+-------++ +-------------+-----------++ AORTIC VALVE             +-------------+-----------++ AV Vmax:     143.00 cm/s +-------------+-----------++ AV Vmean:    94.100 cm/s +-------------+-----------++ AV VTI:      0.208 m     +-------------+-----------++ AV Peak Grad:8.2 mmHg    +-------------+-----------++ AV Mean Grad:4.0 mmHg    +-------------+-----------++ +-------------+---------++ MITRAL VALVE           +-------------+---------++ MV Peak grad:5.7 mmHg  +-------------+---------++ MV Mean grad:2.0 mmHg  +-------------+---------++ MV Vmax:     1.19 m/s  +-------------+---------++ MV Vmean:    68.8 cm/s +-------------+---------++ MV VTI:      0.32 m    +-------------+---------++  Annye Asa MD Electronically signed by Annye Asa MD Signature Date/Time: 03/11/2020/2:09:20 PM    Final    ECHO INTRAOPERATIVE TEE  Result Date: 03/07/2020  *INTRAOPERATIVE TRANSESOPHAGEAL REPORT *  Patient Name:   Rudi Buttler Date of Exam: 03/06/2020 Medical Rec #:  [id number]     Height:       75.0 in Accession #:    [id number]    Weight:       174.2 lb Date of Birth:  1946-02-04      BSA:  2.07 m Patient Age:    48 years      BP:            113/72 mmHg Patient Gender: M             HR:           83 bpm. Exam Location:  Inpatient Transesophogeal exam was perform intraoperatively during surgical procedure. Patient was closely monitored under general anesthesia during the entirety of examination. Indications:     Left atrial thrombus Performing Phys: [id number] Gwenyth Allegra CLARK Diagnosing Phys: Albertha Ghee MD Complications: No known complications during this procedure. POST-OP IMPRESSIONS Overall, there were no significant changes from pre-bypass. PRE-OP FINDINGS  Left Ventricle: The left ventricle has normal systolic function, with an ejection fraction of 55-60%. The cavity size was normal. There is mild concentric left ventricular hypertrophy. No evidence of left ventricular regional wall motion abnormalities. Right Ventricle: The right ventricle has normal systolic function. The cavity was normal. There is no increase in right ventricular wall thickness. Left Atrium: Left atrial size was normal in size. There is a large, mobile, lobulated mass in the LA. The echogenic character is largely homogenous. The edges are smooth and rounded. This is likely a thrombus. It measures approximately 10cm x 5cm. It appears anchored deep within the LUPV and the remaining portion is freely mobile. The most distal portion of the mass extends through the mitral valve during diastole. No significant inflow obstruction is seen. The left atrial appendage is well visualized and there is no evidence of thrombus present. Right Atrium: Right atrial size was normal in size. Right atrial pressure is estimated at 10 mmHg. Interatrial Septum: No atrial level shunt detected by color flow Doppler. Pericardium: There is no evidence of pericardial effusion. Mitral Valve: The mitral valve is normal in structure. Mitral valve regurgitation is trivial by color flow Doppler. Tricuspid Valve: The tricuspid valve was normal in structure. Tricuspid valve regurgitation is trivial by color  flow Doppler. Aortic Valve: The aortic valve is normal in structure. Aortic valve regurgitation was not visualized by color flow Doppler. There is no evidence of aortic valve stenosis. Pulmonic Valve: The pulmonic valve was normal in structure, with normal. Pulmonic valve regurgitation is not visualized by color flow Doppler. Aorta: The ascending aorta and aortic root are normal in size and structure. There is evidence of atheroma immobile plaque in the descending aorta and aortic arch; Grade IV, measuring >9mm in size.  Albertha Ghee MD Electronically signed by Albertha Ghee MD Signature Date/Time: 03/07/2020/9:21:44 AM    Final    VAS US CAROTID  Result Date: 03/12/2020 Carotid Arterial Duplex Study Indications:       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 Risk Factors:      Hypertension, Diabetes, coronary artery disease. Comparison Study:  No prior studies. Performing Technologist: Darlin Coco  Examination Guidelines: A complete evaluation includes B-mode imaging, spectral Doppler, color Doppler, and power Doppler as needed of all accessible portions of each vessel. Bilateral testing is considered an integral part of a complete examination. Limited examinations for reoccurring indications may be performed as noted.  Right Carotid Findings: +----------+--------+--------+--------+-----------------------+--------+           PSV cm/sEDV cm/sStenosisPlaque Description     Comments +----------+--------+--------+--------+-----------------------+--------+ CCA Prox  63      12              heterogenous and smooth         +----------+--------+--------+--------+-----------------------+--------+ CCA Mid  100     21              heterogenous and smooth         +----------+--------+--------+--------+-----------------------+--------+ CCA Distal80      13              heterogenous and smooth         +----------+--------+--------+--------+-----------------------+--------+ ICA Prox   47      13      1-39%   heterogenous                    +----------+--------+--------+--------+-----------------------+--------+ ICA Distal52      20                                              +----------+--------+--------+--------+-----------------------+--------+ ECA       51      9                                               +----------+--------+--------+--------+-----------------------+--------+ +----------+--------+-------+----------------+-------------------+           PSV cm/sEDV cmsDescribe        Arm Pressure (mmHG) +----------+--------+-------+----------------+-------------------+ Subclavian               Multiphasic, WNL                    +----------+--------+-------+----------------+-------------------+ +---------+--------+--+--------+--+---------+ VertebralPSV cm/s73EDV cm/s22Antegrade +---------+--------+--+--------+--+---------+  Left Carotid Findings: +----------+--------+--------+--------+-----------------------+--------+           PSV cm/sEDV cm/sStenosisPlaque Description     Comments +----------+--------+--------+--------+-----------------------+--------+ CCA Prox  97      20              heterogenous                    +----------+--------+--------+--------+-----------------------+--------+ CCA Mid   93      19              heterogenous and smooth         +----------+--------+--------+--------+-----------------------+--------+ CCA Distal104     25              heterogenous and smooth         +----------+--------+--------+--------+-----------------------+--------+ ICA Prox  59      10      1-39%   heterogenous                    +----------+--------+--------+--------+-----------------------+--------+ ICA Distal92      34                                              +----------+--------+--------+--------+-----------------------+--------+ ECA       97      13                                               +----------+--------+--------+--------+-----------------------+--------+ +----------+--------+--------+----------------+-------------------+           PSV cm/sEDV cm/sDescribe        Arm Pressure (  mmHG) +----------+--------+--------+----------------+-------------------+ Subclavian110             Multiphasic, WNL                    +----------+--------+--------+----------------+-------------------+ +---------+--------+--+--------+--+---------+ VertebralPSV cm/s93EDV cm/s29Antegrade +---------+--------+--+--------+--+---------+   Summary: Right Carotid: Velocities in the right ICA are consistent with a 1-39% stenosis.                Non-hemodynamically significant plaque <50% noted in the CCA. Left Carotid: Velocities in the left ICA are consistent with a 1-39% stenosis.               Non-hemodynamically significant plaque <50% noted in the CCA.  *See table(s) above for measurements and observations.  Electronically signed by Ruta Hinds MD on 03/12/2020 at 6:14:00 PM.    Final     Assessment/Plan  1. AF (paroxysmal atrial fibrillation) ([place])  rate-controlled, continue Lovenox  2. DM (diabetes mellitus), secondary, uncontrolled, with peripheral vascular complications ([facility]) Lab Results  Component Value Date   HGBA1C 8.2 (H) 01/15/2020   -CBGs stable, continue Metformin and Lantus  3. Occlusion of right femorotibial bypass graft, subsequent encounter -  S/P thrombectomy/4 compartment fasciotomy likely secondary to tumor embolization from large atrial thrombus/tumor due to a mass eroding into the mediastinum into the pulmonary vein and into the left atrium - 7/23 thrombectomy/thrombosed biopsy confirms malignancy -Continue Lovenox -Deemed not an operative candidate  4. Malignant neoplasm of hilus of left lung ([place]) -Diagnosed with lung cancer in 2019 but decided not to proceed with any treatment -Follow-up with oncology  5. Chronic obstructive pulmonary disease, unspecified  COPD type ([place]) -  Has slight dyspnea on room air, continue Incruse Ellipta INH and PRN albuterol    Family/ staff Communication: Discussed plan of care with resident and charge nurse.  Labs/tests ordered: None  Goals of care:   Short-term care  Durenda Age, DNP, MSN, FNP-BC [facility] [phone] (Monday-Friday 8:00 a.m. - 5:00 p.m.) [phone] (after hours)

## 2020-04-01 ENCOUNTER — Telehealth: Payer: Self-pay | Admitting: *Deleted

## 2020-04-01 ENCOUNTER — Ambulatory Visit (INDEPENDENT_AMBULATORY_CARE_PROVIDER_SITE_OTHER): Payer: Self-pay | Admitting: Physician Assistant

## 2020-04-01 ENCOUNTER — Other Ambulatory Visit: Payer: Self-pay

## 2020-04-01 VITALS — BP 94/68 | HR 100 | Temp 98.8°F | Resp 20 | Ht 75.0 in

## 2020-04-01 DIAGNOSIS — I998 Other disorder of circulatory system: Secondary | ICD-10-CM

## 2020-04-01 DIAGNOSIS — I70229 Atherosclerosis of native arteries of extremities with rest pain, unspecified extremity: Secondary | ICD-10-CM

## 2020-04-01 NOTE — Telephone Encounter (Signed)
[name] called a nurse from [facility] concerning patient's wound.  She states the staples appear to be pulling apart, dehisence of wound and eschar.  An appointment was scheduled today, 04/01/2020 at 3:45.

## 2020-04-01 NOTE — Progress Notes (Signed)
POST OPERATIVE OFFICE NOTE    CC:  F/u for surgery  HPI:  This is a 74 y.o. 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 Donzetta Matters on 01/15/2020.  He presented to the ED today with acute right limb ischemia and had no motor or sensation in the right leg.  He was found to have an occluded right limb of his aortobiiliac endograft.  Also question possibility of left atrial thrombus on CT that was obtained in the ED.  He presents emergently to the operating room after risk benefits discussed including high risk for limb loss and high risk for mortality.  An assistant was needed for exposure and to expedite the case.  On 03/06/2020, he underwent: 1.  Thrombectomy of right limb of aorto biiliac endograft using over-the-wire Fogarty 2.  Right lower extremity thrombectomy from femoral approach 3.  Aortogram with bilateral iliac arteriogram 4.  Redo exposure of left common femoral artery 5.  Thrombectomy of left limb of aortobiiliac endograft  6.  Left lower extremity thrombectomy from femoral approach 7.  Right lower extremity 4 compartment fasciotomies By Dr. Carlis Abbott.  He was taken back to the OR on 03/11/2020 for fasciotomy closures.  Pt was found to have a LUL mass with invasion into the atrial appendage as well as the pulmonary vein and left atrium extending to the mitral valve.  CT surgery deemed pt not a surgical candidate.    Pt returns today for follow up & is accompanied by his wife.  He states that his right foot does not have any pain.  He states he does have some pain in his incisions occasionally.  He is here today bc they were worried about his incisions opening up.  Pt states he wears out easily.  He is a little short of breath today  but 100% on RA.  His wife tells me that they are waiting on insurance approval to get treated at [facility].  She states they are just too old to be driving up and down the highway.  He has fallen a couple of times at the nursing facility.  His wife states he is independent and wants to do for himself.   He and his wife have been married for 43 years.   Allergies  Allergen Reactions   Erythromycin Shortness Of Breath and Swelling   Simvastatin Other (See Comments)    CAUSE MUSCLE PAIN,.   Statins Other (See Comments)    Muscle pain    Current Outpatient Medications  Medication Sig Dispense Refill   acetaminophen (TYLENOL) 325 MG tablet Take 2 tablets (650 mg total) by mouth every 6 (six) hours as needed for mild pain (or temp >/= 101 F).     albuterol (PROVENTIL HFA;VENTOLIN HFA) 108 (90 BASE) MCG/ACT inhaler Inhale 2 puffs into the lungs every 6 (six) hours as needed for wheezing or shortness of breath.      Calcium Carb-Cholecalciferol ([employer] CALCIUM 600 & VITAMIN D3) 600-800 MG-UNIT TABS Take 1 tablet by mouth daily.     Calcium Carbonate-Simethicone (ALKA-SELTZER HEARTBURN + GAS PO) Take 2 each by mouth every 4 (four) hours as needed.     clopidogrel (PLAVIX) 75 MG tablet Take 75 mg by mouth daily.     Cyanocobalamin (  VITAMIN B 12 PO) Take 500 mcg by mouth daily.      diclofenac sodium (VOLTAREN) 1 % GEL Apply 4 g topically 3 (three) times daily as needed (pain).      DULoxetine (CYMBALTA) 30 MG capsule Take 30 mg by mouth daily.     enoxaparin (LOVENOX) 80 MG/0.8ML injection Inject 0.8 mLs (80 mg total) into the skin every 12 (twelve) hours. 22.4 mL    finasteride (PROSCAR) 5 MG tablet Take 5 mg by mouth daily.     Glucerna (GLUCERNA) LIQD Take 237 mLs by mouth 2 (two) times daily between meals.     hydroxypropyl methylcellulose / hypromellose (ISOPTO TEARS / GONIOVISC) 2.5 % ophthalmic solution Place 1-2 drops into both eyes in the morning and at bedtime.       insulin glargine (LANTUS SOLOSTAR) 100 UNIT/ML Solostar Pen Inject 12 Units into the skin daily.     loratadine (CLARITIN) 10 MG tablet Take 10 mg by mouth daily.     metFORMIN (GLUCOPHAGE-XR) 500 MG 24 hr tablet Take 1,000 mg by mouth in the morning and at bedtime.      nitroGLYCERIN (NITROSTAT) 0.4 MG SL tablet Place 0.4 mg under the tongue every 5 (five) minutes as needed for chest pain.     Nutritional Supplement LIQD Take 120 mLs by mouth daily. MedPass     Nutritional Supplements (NUTRITIONAL SUPPLEMENT PO) Take 1 each by mouth in the morning and at bedtime. Magic Cup     Nystatin (GERHARDT'S BUTT CREAM) CREA Apply 1 application topically 3 (three) times daily. Apply in a thin layer to affected area in the gluteal cleft three times daily and PRN after defecation and cleansing.     Olodaterol HCl 2.5 MCG/ACT AERS Inhale 2 puffs into the lungs daily.     oxyCODONE (OXY IR/ROXICODONE) 5 MG immediate release tablet Take 1 tablet (5 mg total) by mouth every 6 (six) hours as needed for moderate pain. 15 tablet 0   pantoprazole (PROTONIX) 40 MG tablet Take 1 tablet (40 mg total) by mouth daily.     polyethylene glycol (MIRALAX / GLYCOLAX) 17 g packet Take 17 g by mouth daily as needed for moderate constipation. 14 each 0   tamsulosin (FLOMAX) 0.4 MG CAPS capsule Take 0.4 mg by mouth at bedtime.      umeclidinium bromide (INCRUSE ELLIPTA) 62.5 MCG/INH AEPB Inhale 1 puff into the lungs daily.     No current facility-administered medications for this visit.     ROS:  See HPI  Physical Exam:  Today's Vitals   04/01/20 1550  BP: 94/68  Pulse: 100  Resp: 20  Temp: 98.8 F (37.1 C)  TempSrc: Temporal  SpO2: 100%  Height: 6\' 3"  (1.905 m)  PainSc: 6   PainLoc: Leg   Body mass index is 21.65 kg/m.   Incision:  Right groin incision is healing nicely.      Extremities:  +doppler signal present in right DP/PT/peroneal. Motor and sensation are  intact.   Assessment/Plan:  This is a 74 y.o. male who is s/p: 1.  Thrombectomy of right limb of aorto biiliac endograft using over-the-wire Fogarty 2.  Right lower extremity thrombectomy from femoral approach 3.  Aortogram with bilateral iliac arteriogram 4.  Redo exposure of left common femoral artery 5.  Thrombectomy of left limb of aortobiiliac endograft  6.  Left lower extremity thrombectomy from femoral approach 7.  Right lower extremity 4 compartment fasciotomies   -pt has +doppler flow right foot. -  his incisions as above-the staples were removed today.  Dr. Carlis Abbott also inspected the wound and feels that we will continue to watch the incisions and how they demarcate.  He did discuss with pt going in hospital for debridement, but given what he has been through, we will elect to to wait and watch.  Pt and his wife know this will change if he develops any infection.  Pt would not tolerate any debridement here in the office. -instructed facility to wash incisions with soap and water the apply xeroform, kerlix and 4" ace.  They are instructed not to wrap the ace too tightly-only snug enough to keep the dressing in place.  -pt has appt with Dr. Donzetta Matters on 8/20 and he will keep that appt.  -they will call sooner if there are any issues before then.  Leontine Locket, [facility] [phone]  Clinic MD:  Pt seen and examined with Dr. Carlis Abbott

## 2020-04-03 ENCOUNTER — Non-Acute Institutional Stay (SKILLED_NURSING_FACILITY): Payer: Medicare PPO | Admitting: Adult Health

## 2020-04-03 ENCOUNTER — Encounter: Payer: Self-pay | Admitting: Adult Health

## 2020-04-03 DIAGNOSIS — E1365 Other specified diabetes mellitus with hyperglycemia: Secondary | ICD-10-CM

## 2020-04-03 DIAGNOSIS — I480 Paroxysmal atrial fibrillation: Secondary | ICD-10-CM | POA: Diagnosis not present

## 2020-04-03 DIAGNOSIS — IMO0002 Reserved for concepts with insufficient information to code with codable children: Secondary | ICD-10-CM

## 2020-04-03 DIAGNOSIS — J449 Chronic obstructive pulmonary disease, unspecified: Secondary | ICD-10-CM

## 2020-04-03 DIAGNOSIS — T82898D Other specified complication of vascular prosthetic devices, implants and grafts, subsequent encounter: Secondary | ICD-10-CM | POA: Diagnosis not present

## 2020-04-03 DIAGNOSIS — E1351 Other specified diabetes mellitus with diabetic peripheral angiopathy without gangrene: Secondary | ICD-10-CM | POA: Diagnosis not present

## 2020-04-03 DIAGNOSIS — R441 Visual hallucinations: Secondary | ICD-10-CM

## 2020-04-03 DIAGNOSIS — C3402 Malignant neoplasm of left main bronchus: Secondary | ICD-10-CM | POA: Diagnosis not present

## 2020-04-03 NOTE — Progress Notes (Signed)
Location:  [facility] Room Number: 216-A Place of Service:  SNF (31) Provider:  Durenda Age, DNP, FNP-BC  Patient Care Team: [place] as PCP - General ([place]) [place] Booze, MD as PCP - Cardiology (Cardiology) Patient, No Pcp Per (General Practice)  Extended Emergency Contact Information Primary Emergency Contact: Locicero,Antionette Address: [address]          [place], [place] [address] [place] Phone: [phone] Mobile Phone: [phone] Relation: Spouse  Code Status:  Full Code  Goals of care: Advanced Directive information Advanced Directives 03/07/2020  Does Patient Have a Medical Advance Directive? No  Would patient like information on creating a medical advance directive? No - Patient declined     Chief Complaint  Patient presents with  . Medical Management of Chronic Issues    Routine short-term rehabilitation visit    HPI:  Pt is a 74 y.o. male seen today for medical management of chronic diseases.  He is a short-term rehabilitation resident of [facility]. He has a PMH of essential hypertension, diabetes mellitus complicated by PVD, history of renal calculi, CAD and history of asthma.  He was seen in the room today. He appears to be slightly dyspneic but O2 sat on room air was 100%. No wheezing. He talked about a cat that he sees in the room everyday. Of note, he has a window that has a view of the outside.  Informed him that there is no cat in the facility. Calmly he replied,"I see one every morning." CBGs ranging from 97 to 205. He takes Metformin 500 mg twice a day and Lantus 12 units at bedtime. He followed up with vascular surgery on 04/01/20. Staples were removed on his right lower leg. Wife came to visit in the afternoon and stated that he was hallucinating when he was in the hospital while he was taking Percocet. He is currently taking PRN Oxycodone.  He was  admitted to [facility] on 03/19/2020 post hospitalization 03/06/2020 through 03/19/2020.  He presented to the hospital with the right ischemic leg and was found to have occluded right/left limb of the aorto-biiliac endograft, underwent emergent thrombectomy and RLE 4 compartment fasciotomy.  Further evaluation with intraoperative TEE showed a mobile left atrial thrombus.  Subsequent CT imaging study showed a large left mass eroding into the mediastinum into the pulmonary vein and into the left atrium.  Suspicion for tumor embolism causing ischemic leg.  In 2019, patient was diagnosed with squamous cell carcinoma of the lung at [facility] for which he refused treatment.   Past Medical History:  Diagnosis Date  . Asthma   . Coronary artery disease   . Diabetes mellitus without complication ([place])   . History of kidney stones   . Hypertension   . Peripheral vascular disease [facility])    Past Surgical History:  Procedure Laterality Date  . AORTOGRAM  03/06/2020   Procedure: AORTOGRAM WITH BILATERAL ILIAC ARTERIOGRAM;  Surgeon: Marty Heck, MD;  Location: [place];  Service: Vascular;;  . CARDIAC CATHETERIZATION N/A 07/05/2016   Procedure: Left Heart Cath and Coronary Angiography;  Surgeon: Sherren Mocha, MD;  Location: [facility] CV LAB;  Service: Cardiovascular;  Laterality: N/A;  . CORONARY ANGIOPLASTY    . EMBOLECTOMY Bilateral 03/06/2020   Procedure: THROMBECTOMY OF RIGHT LIMB OF BIFURCATED AORTO-BI-ILIAC STENT GRAFT; RIGHT LOWER EXTREMITY THROMBECTOMY USING THE FEMORAL APPROACH; REDO EXPOSURE LEFT COMMON FEMORAL ARTERY; THROMBECTOMY LEFT LIMB OF  AORTO-BI-ILIAC STENT GRAFT;  LEFT LOWER EXTREMITY THROMBECTOMY USING THE FEMORAL APPROACH;  Surgeon: Marty Heck, MD;  Location: [facility];  Service: Vascular;  Laterality: Bilateral;  . ENDOVASCULAR STENT INSERTION N/A 01/15/2020   Procedure: ENDOVASCULAR ANEURYSM REPAIR;  Surgeon: Waynetta Sandy, MD;  Location: [facility];  Service: Vascular;  Laterality: N/A;  . FASCIOTOMY Right 03/06/2020   Procedure: RIGHT LOWER EXTREMITY FOUR COMPARTMENT FASCIOTOMIES;  Surgeon: Marty Heck, MD;  Location: [facility];  Service: Vascular;  Laterality: Right;  . FASCIOTOMY CLOSURE Right 03/11/2020   Procedure: RIGHT LOWER EXTREMITY FASCIOTOMY WASHOUT AND CLOSURE;  Surgeon: Waynetta Sandy, MD;  Location: [place];  Service: Vascular;  Laterality: Right;  . heart stents  2006  . INSERTION OF ILIAC STENT Left 01/15/2020   Procedure: INSERTION OF LEFT EXTERNAL ILIAC ARTERY STENT;  Surgeon: Waynetta Sandy, MD;  Location: [place];  Service: Vascular;  Laterality: Left;  . LOWER EXTREMITY ANGIOGRAM  01/15/2020   Procedure: Left Lower Extremity Angiogram;  Surgeon: Waynetta Sandy, MD;  Location: [place];  Service: Vascular;;  . TEE WITHOUT CARDIOVERSION  03/11/2020   Procedure: TRANSESOPHAGEAL ECHOCARDIOGRAM (TEE);  Surgeon: Waynetta Sandy, MD;  Location: [facility];  Service: Vascular;;  . THROMBECTOMY ILIAC ARTERY  01/15/2020   Procedure: THROMBECTOMY OF LEFT ILIAC ARTERY;  Surgeon: Waynetta Sandy, MD;  Location: [facility];  Service: Vascular;;    Allergies  Allergen Reactions  . Erythromycin Shortness Of Breath and Swelling  . Simvastatin Other (See Comments)    CAUSE MUSCLE PAIN,.  . Statins Other (See Comments)    Muscle pain    Outpatient Encounter Medications as of 04/03/2020  Medication Sig  . acetaminophen (TYLENOL) 325 MG tablet Take 2 tablets (650 mg total) by mouth every 6 (six) hours as needed for mild pain (or temp >/= 101 F).  Marland Kitchen albuterol (PROVENTIL HFA;VENTOLIN HFA) 108 (90 BASE) MCG/ACT inhaler Inhale 2 puffs into the lungs every 6 (six) hours as needed for wheezing or shortness of breath.   . Calcium Carb-Cholecalciferol ([employer] CALCIUM 600 & VITAMIN D3) 600-800 MG-UNIT TABS Take 1 tablet by mouth daily.  . Calcium Carbonate-Simethicone (ALKA-SELTZER HEARTBURN + GAS PO) Take  2 each by mouth every 4 (four) hours as needed.  . clopidogrel (PLAVIX) 75 MG tablet Take 75 mg by mouth daily.  . Cyanocobalamin (VITAMIN B 12 PO) Take 500 mcg by mouth daily.   . diclofenac sodium (VOLTAREN) 1 % GEL Apply 4 g topically 3 (three) times daily as needed (pain).   . DULoxetine (CYMBALTA) 30 MG capsule Take 30 mg by mouth daily.  Marland Kitchen enoxaparin (LOVENOX) 80 MG/0.8ML injection Inject 0.8 mLs (80 mg total) into the skin every 12 (twelve) hours.  . finasteride (PROSCAR) 5 MG tablet Take 5 mg by mouth daily.  . Glucerna (GLUCERNA) LIQD Take 237 mLs by mouth 2 (two) times daily between meals.  . hydroxypropyl methylcellulose / hypromellose (ISOPTO TEARS / GONIOVISC) 2.5 % ophthalmic solution Place 1-2 drops into both eyes in the morning and at bedtime.   . insulin glargine (LANTUS SOLOSTAR) 100 UNIT/ML Solostar Pen Inject 12 Units into the skin daily.  Marland Kitchen loratadine (CLARITIN) 10 MG tablet Take 10 mg by mouth daily.  . metFORMIN (GLUCOPHAGE-XR) 500 MG 24 hr tablet Take 1,000 mg by mouth in the morning and at bedtime.   . nitroGLYCERIN (NITROSTAT) 0.4 MG SL tablet Place 0.4 mg under the tongue every 5 (five) minutes as needed for chest pain.  Marland Kitchen  Nutritional Supplement LIQD Take 120 mLs by mouth daily. MedPass  . Nutritional Supplements (NUTRITIONAL SUPPLEMENT PO) Take 1 each by mouth in the morning and at bedtime. Magic Cup  . Nystatin (GERHARDT'S BUTT CREAM) CREA Apply 1 application topically 3 (three) times daily. Apply in a thin layer to affected area in the gluteal cleft three times daily and PRN after defecation and cleansing.  . Olodaterol HCl 2.5 MCG/ACT AERS Inhale 2 puffs into the lungs daily.  Marland Kitchen oxyCODONE (OXY IR/ROXICODONE) 5 MG immediate release tablet Take 1 tablet (5 mg total) by mouth every 6 (six) hours as needed for moderate pain.  . pantoprazole (PROTONIX) 40 MG tablet Take 1 tablet (40 mg total) by mouth daily.  . polyethylene glycol (MIRALAX / GLYCOLAX) 17 g packet Take 17  g by mouth daily as needed for moderate constipation.  . tamsulosin (FLOMAX) 0.4 MG CAPS capsule Take 0.4 mg by mouth at bedtime.   Marland Kitchen umeclidinium bromide (INCRUSE ELLIPTA) 62.5 MCG/INH AEPB Inhale 1 puff into the lungs daily.  . Zinc Oxide (BOUDREAUXS BUTT PASTE EX) Apply 1 application topically in the morning and at bedtime. Apply to coccyx and gluteal fold   No facility-administered encounter medications on file as of 04/03/2020.    Review of Systems  GENERAL: No fever, chills or weakness MOUTH and THROAT: Denies oral discomfort, gingival pain or bleeding RESPIRATORY: no cough, SOB, DOE, wheezing, hemoptysis CARDIAC: No chest pain  GI: No abdominal pain, diarrhea, constipation, heart burn GU: Denies dysuria, frequency, hematuria, incontinence, or discharge NEUROLOGICAL: Denies dizziness, syncope, numbness, or headache PSYCHIATRIC: +hallucinations about a cat in his room    Immunization History  Administered Date(s) Administered  . PFIZER SARS-COV-2 Vaccination 12/06/2019, 12/27/2019   Pertinent  Health Maintenance Due  Topic Date Due  . OPHTHALMOLOGY EXAM  Never done  . URINE MICROALBUMIN  Never done  . COLONOSCOPY  Never done  . PNA vac Low Risk Adult (1 of 2 - PCV13) Never done  . INFLUENZA VACCINE  03/23/2020  . HEMOGLOBIN A1C  07/17/2020  . FOOT EXAM  11/13/2020    Vitals:   04/03/20 1015  BP: 130/74  Pulse: 80  Resp: 20  Temp: 97.6 F (36.4 C)  TempSrc: Oral  Weight: 173 lb 3.2 oz (78.6 kg)  Height: 6\' 3"  (1.905 m)   Body mass index is 21.65 kg/m.  Physical Exam  GENERAL APPEARANCE: Well nourished. In no acute distress. Normal body habitus SKIN:  Right lower leg with dressing/ace wrap MOUTH and THROAT: Lips are without lesions. Oral mucosa is moist and without lesions. Tongue is normal in shape, size, and color and without lesions RESPIRATORY: Breathing is even & unlabored, BS CTAB CARDIAC:  no murmur,no extra heart sounds GI: Abdomen soft, normal BS,  no masses, no tenderness EXTREMITIES:  Able to move X 4 extremities NEUROLOGICAL: There is no tremor. Speech is clear. Alert and oriented X 3. PSYCHIATRIC:  Affect and behavior are appropriate  Labs reviewed: Recent Labs    03/10/20 0234 03/10/20 0234 03/11/20 6659 03/11/20 0318 03/12/20 0251 03/12/20 0251 03/13/20 0304 03/15/20 0246 03/17/20 0152  NA 134*   < > 135   < > 136   < > 135 137 135  K 4.3   < > 3.7   < > 4.0   < > 3.6 4.0 3.9  CL 100   < > 102   < > 103   < > 102 104 102  CO2 27   < > 24   < >  24   < > 23 23 24   GLUCOSE 161*   < > 158*   < > 155*   < > 134* 142* 133*  BUN 12   < > 11   < > 12   < > 10 10 11   CREATININE 0.95   < > 0.93   < > 1.06   < > 0.99 0.92 1.08  CALCIUM 8.8*   < > 8.8*   < > 9.0   < > 8.8* 9.0 9.3  MG 1.9  --  2.0  --  1.8  --   --   --   --   PHOS 2.7   < > 2.7  --  2.9  --  2.5  --   --    < > = values in this interval not displayed.   Recent Labs    12/17/19 1312 01/16/20 1652 03/11/20 0318  AST 19 23 38  ALT 12 12 22   ALKPHOS 55 42 50  BILITOT 0.8 0.7 1.8*  PROT 6.8 5.0* 5.9*  ALBUMIN 3.7 2.6* 2.9*   Recent Labs    12/17/19 1312 12/17/19 1413 01/16/20 1652 01/17/20 0856 03/06/20 1417 03/06/20 1432 03/16/20 0244 03/17/20 0152 03/19/20 0119  WBC 6.8   < > 7.5   < > 9.5   < > 8.3 9.3 10.2  NEUTROABS 3.3  --  5.1  --  6.6  --   --   --   --   HGB 13.8   < > 10.4*   < > 10.9*   < > 9.0* 9.4* 9.5*  HCT 44.7   < > 32.4*   < > 35.8*   < > 28.5* 30.2* 29.2*  MCV 92.0   < > 89.5   < > 89.7   < > 87.2 87.0 86.9  PLT 152   < > 131*   < > 51*   < > 160 149* 128*   < > = values in this interval not displayed.   Lab Results  Component Value Date   TSH 1.786 07/06/2016   Lab Results  Component Value Date   HGBA1C 8.2 (H) 01/15/2020   Lab Results  Component Value Date   CHOL 124 03/07/2020   HDL 23 (L) 03/07/2020   LDLCALC 87 03/07/2020   TRIG 72 03/07/2020   CHOLHDL 5.4 03/07/2020    Significant Diagnostic Results in  last 30 days:  DG Abd 1 View  Result Date: 03/06/2020 CLINICAL DATA:  Arteriogram. EXAM: ABDOMEN - 1 VIEW COMPARISON:  March 06, 2020 FINDINGS: The patient has undergone abdominal aortic angiogram. An endograft is noted. The left limb of the graft appears to be patent. Several images demonstrate non patency of the right limb. IMPRESSION: The patient has undergone abdominal aortic angiogram. Please see separate operative report for complete details. Electronically Signed   By: Constance Holster M.D.   On: 03/06/2020 19:49   CT CHEST W CONTRAST  Addendum Date: 03/14/2020   ADDENDUM REPORT: 03/14/2020 13:45 ADDENDUM: Thoracic surgeon Dr. Servando Snare called for a second opinion regarding the location of the cardiac mass. The irregular soft tissue mass within the left atrial appendage, left superior pulmonary vein and left aspect of the left atrium extending to mitral valve and potentially to the entrance of the left ventricle is contiguous with the large central left upper lobe lung mass and is favored to represent direct tumor invasion of these structures. While a component of bland thrombus is possible at  the periphery of this cardiac mass, this is felt to largely represent direct cardiac tumor invasion. These results were discussed by telephone by Dr. Polly Cobia at 03/14/2020 at 1:44 pm with Dr. Servando Snare, who verbally acknowledged these results. Electronically Signed   By: Ilona Sorrel M.D.   On: 03/14/2020 13:45   Result Date: 03/14/2020 CLINICAL DATA:  Abnormal x-ray findings. EXAM: CT CHEST WITH CONTRAST TECHNIQUE: Multidetector CT imaging of the chest was performed during intravenous contrast administration. CONTRAST:  59mL OMNIPAQUE IOHEXOL 300 MG/ML  SOLN COMPARISON:  March 12, 2020 FINDINGS: Cardiovascular: The pulmonary arteries are limited in evaluation secondary to suboptimal opacification with intravenous contrast. Normal heart size. A 3.6 cm x 2.4 cm area of low attenuation is seen within the anterior  aspect of the right atrium. This extends from a large left suprahilar mass. Mild involvement of the left atrium is noted. No pericardial effusion. Mediastinum/Nodes: No enlarged mediastinal, hilar, or axillary lymph nodes. Thyroid gland, trachea, and esophagus demonstrate no significant findings. Lungs/Pleura: There is a 10.5 cm x 5.7 cm lobulated soft tissue mass seen along the left hilar and left suprahilar regions. There is no evidence of acute infiltrate, pleural effusion or pneumothorax. Upper Abdomen: There is evidence of prior stenting of the infrarenal abdominal aorta. Musculoskeletal: No chest wall abnormality. No acute or significant osseous findings. IMPRESSION: 1. 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 2. 3.6 cm x 2.4 cm area of low attenuation within the anterior aspect of the right atrium and adjacent portion of the left ventricle. While this is likely consistent with vascular invasion of the previously noted lung mass, a component of associated thrombus cannot be excluded. 3. Evidence of prior stenting of the infrarenal abdominal aorta. Electronically Signed: By: Virgina Norfolk M.D. On: 03/13/2020 19:05   CT ANGIO AO+BIFEM W & OR WO CONTRAST  Result Date: 03/06/2020 CLINICAL DATA:  74 year old with sudden onset of right leg weakness. Abdominal aortic aneurysm repair 1 month ago. EXAM: CT ANGIOGRAPHY OF ABDOMINAL AORTA WITH ILIOFEMORAL RUNOFF TECHNIQUE: Multidetector CT imaging of the abdomen, pelvis and lower extremities was performed using the standard protocol during bolus administration of intravenous contrast. Multiplanar CT image reconstructions and MIPs were obtained to evaluate the vascular anatomy. CONTRAST:  164mL OMNIPAQUE IOHEXOL 350 MG/ML SOLN COMPARISON:  CTA 01/16/2020 and 01/14/2020 FINDINGS: VASCULAR Heart: There is concern for filling defects in the left atrium that could represent thrombus. This area is incompletely evaluated.  This area of concern is seen on sequence 5, image 1. Aorta: Normal appearance of the distal descending thoracic aorta. Again noted is an aortic stent graft in the infrarenal aorta. Main body of the stent graft is patent but the right limb of the stent graft is occluded. Left limb of the graft remains patent. The abdominal aortic aneurysm sac measures 4.8 x 4.4 cm and previously measured 5.1 x 4.5 cm. No obvious endoleak on this arterial phase of imaging. No stranding or fluid around the aneurysm sac. Celiac: Celiac trunk and main branch vessels are patent. SMA: Mild atherosclerotic disease at the origin without significant stenosis. Main branches of the SMA are patent. Renals: Single bilateral renal arteries are patent. IMA: Origin of the IMA is occluded due to the aneurysm sac. Evidence for reconstitution beyond the origin. RIGHT Lower Extremity Inflow: Right limb of the aortic stent graft is occluded. This represents a new finding. Chronic stenosis at the origin of the right internal iliac artery. Right external iliac artery  is patent. Outflow: Flow postsurgical changes in the right groin. The right common femoral artery has atherosclerotic disease but patent. Right profunda femoral artery is patent. Right SFA is patent with areas of at least mild narrowing. Again noted is a short segment narrowing in the distal right SFA measuring close to 50% stenosis. These findings are unchanged. Right popliteal artery is patent. Again noted is focal narrowing in the right popliteal artery just above the knee joint and this may be related to a chronic dissection in this area. Degree of stenosis appears to be greater than 50% at the popliteal artery dissection. Runoff: Primary runoff is the peroneal artery which is similar to the previous examination. Small amount of flow in the proximal anterior tibial artery and there may be decreased flow along the anterior tibial artery collateral distribution. This may be related to a  small amount of thrombus or slow flow. Posterior tibial occludes in the mid calf and this is similar to the previous examination. Previously, there was distal reconstitution of the posterior tibial artery which is not clearly identified on this examination. LEFT Lower Extremity Inflow: Left aortic stent graft limb is patent. Left common and external iliac artery stents remain patent. Outflow: Postsurgical changes around the left common femoral artery. Left common femoral artery is patent. Left profunda femoral arteries are patent. Left SFA is patent with mild areas of narrowing. Stable appearance of the left SFA. Left popliteal artery is patent Runoff: Again noted is narrowing at the origin of the left anterior tibial artery. Left anterior tibial artery occludes in the proximal calf and there may be some areas of segmental reconstitution. Tibioperoneal trunk is patent. Peroneal artery is patent and there may be improved flow in the peroneal artery compared to the previous examination. Previously, there was a segment of the peroneal artery that was occluded. Posterior tibial artery occludes in the proximal calf. Evidence for segmental reconstitution of the posterior tibial artery. Veins: No obvious venous abnormality within the limitations of this arterial phase study. Review of the MIP images confirms the above findings. NON-VASCULAR Lower chest: Lung bases are clear without pleural effusions. Hepatobiliary: Gallbladder is decompressed. Main portal veins are patent. Mild dilatation of the proximal extrahepatic bile duct measuring up to 1.3 cm and unchanged. Distal common bile duct measures roughly 0.5 cm and similar to the previous examination. Pancreas: Unremarkable. No pancreatic ductal dilatation or surrounding inflammatory changes. Spleen: Normal in size without focal abnormality. Adrenals/Urinary Tract: Normal appearance of the adrenal glands. Negative for hydronephrosis. Probable small renal cysts. Urinary  bladder is unremarkable. Stomach/Bowel: Stomach is within normal limits. Appendix appears normal. No evidence of bowel wall thickening, distention, or inflammatory changes. Lymphatic: No significant lymph node enlargement in the abdomen or pelvis. Reproductive: Prostate is mildly prominent measuring 4.8 cm in transverse dimension. Other: Negative for ascites.  Negative for free air. Musculoskeletal: No acute bone abnormality. IMPRESSION: VASCULAR 1. Right limb of the bifurcated aortic stent graft is occluded. This is a new finding and consistent with patient's right leg symptoms. 2. Concern for filling defects in the left atrium. This could represent left atrial thrombus and source for embolic disease. 3. Right runoff has slightly changed since 01/14/2020. There is now single-vessel runoff from the peroneal artery. Previously, there was flow at the ankle from the posterior tibial artery. The lack of flow in the distal posterior tibial artery could be related to slow flow from the occluded stent limb but cannot exclude embolic disease involving the right runoff vessels.  4. No significant change in the right outflow disease. Right SFA and right popliteal artery remain patent. There continues to be focal narrowing in the right popliteal artery possibly related to a dissection. 5. Left iliac artery stents remain patent. No significant change in the left outflow or runoff vessels. NON-VASCULAR 1. No acute abnormality in the abdomen or pelvis. These results were called by telephone at the time of interpretation on 03/06/2020 at 3:30 pm to provider Monica Martinez, MD, who verbally acknowledged these results. Electronically Signed   By: Markus Daft M.D.   On: 03/06/2020 16:13   CT CORONARY MORPH W/CTA COR W/SCORE W/CA W/CM &/OR WO/CM  Addendum Date: 03/13/2020   ADDENDUM REPORT: 03/13/2020 09:16 EXAM: OVER-READ INTERPRETATION  CT CHEST The following report is an over-read performed by radiologist Dr. Norlene Duel  [facility] on 03/13/2020. This over-read does not include interpretation of cardiac or coronary anatomy or pathology. The coronary CTA and coronary calcium score interpretation by the cardiologist is attached. COMPARISON:  CTA 01/16/2020 FINDINGS: Vascular: Normal heart size.  No pericardial effusion identified. Mediastinum/nodes: Dilated and patulous esophagus. Lungs/pleura: There is a large, left upper lobe, perihilar lung mass which measures 8.9 x 5.5 cm, image 3/11. This mass encases the left hilum and occludes the left upper lobe and lingular bronchi with signs of postobstructive pneumonitis in the left upper lobe. The mass invades the left upper lobe pulmonary vein with tumor thrombus identified in the left atrium. The tumor thrombus protrudes through the mitral valve into the left ventricle. Upper abdomen: No acute abnormality. Musculoskeletal: No acute abnormality. IMPRESSION: 1. 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 This encases the left hilum and occludes the left upper lobe and lingular bronchi with signs of postobstructive pneumonitis in the left upper lobe. The mass invades the left upper lobe pulmonary vein with tumor thrombus in the left atrium, extending through the mitral valve. Recommend further evaluation with PET-CT. Electronically Signed   By: Kerby Moors M.D.   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 EXAM: Cardiac/Coronary  CTA TECHNIQUE: The patient was scanned on a [employer]. FINDINGS: A 100 kV prospective scan was triggered in the descending thoracic aorta at 111 HU's. Axial non-contrast 3 mm slices were carried out through the heart. The data set was analyzed on a dedicated work station and scored using the [place]. Gantry rotation  speed was 250 msecs and collimation was .6 mm. No beta blockade and 0.8 mg of sl NTG was given. The 3D data set was reconstructed in 5% intervals of the 67-82 % of the R-R cycle. Diastolic phases were analyzed on a dedicated work station using MPR, MIP and VRT modes. The patient received 80 cc of contrast. Aorta: Normal size. There is moderate to severe diffuse atherosclerotic plaque with mild calcifications. No dissection. Aortic Valve:  Trileaflet.  No calcifications. Coronary Arteries: Normal coronary origin. Right dominance. Coronary artery evaluation is affected by significant motion and poor vasodilation and therefore not interpretable. There are two stents in the proximal to mid RCA. 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 The thrombus is highly mobile with high risk  of embolization. It is partially protruding into the left ventricle across the mitral valve and partially obstructing the flow to the left ventricle. The thrombus is only partially extending into the left atrial appendage. Dilated pulmonary artery suggestive of pulmonary hypertension. IMPRESSION: 1. Coronary Arteries: Normal coronary origin. Right dominance. Coronary artery evaluation is affected by significant motion and poor vasodilation and therefore not interpretable. There are two stents in the proximal to mid RCA. 2. There is moderate to severe diffuse atherosclerotic plaque with mild calcifications. No dissection. 3. 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 The thrombus is highly mobile with high risk of embolization. It is partially protruding into the left ventricle across the mitral valve and partially obstructing the flow to the left ventricle. The thrombus is only  partially extending into the left atrial appendage. 4. Dilated pulmonary artery suggestive of pulmonary hypertension. Electronically Signed: By: Ena Dawley On: 03/12/2020 18:57   CT CORONARY FRACTIONAL FLOW RESERVE DATA PREP  Result Date: 03/21/2020 EXAM: CT FFR ANALYSIS CLINICAL DATA:  74 year old male with abnormal CCTA. FINDINGS: FFRct analysis was performed on the original cardiac CT angiogram dataset. Diagrammatic representation of the FFRct analysis is provided in a separate PDF document in PACS. This dictation was created using the PDF document and an interactive 3D model of the results. 3D model is not available in the EMR/PACS. Normal FFR range is >0.80. 1. Left Main: 0.98. 2. LAD: Proximal: 0.96, mid: 0.90, distal (small lumen): 0.76. 3. LCX: 0.88. 4. OM1: 0.86 5. RCA: FFR analysis unavailable due to the presence of a metallic stent. IMPRESSION: 1. CT FFR analysis didn't show any significant stenosis in the left main, proximal or mid LAD or LCX arteries. CT FFR analysis unavailable for RCA due to the presence of a metallic stent. Electronically Signed   By: Ena Dawley   On: 03/21/2020 12:23   DG C-Arm 1-60 Min  Result Date: 03/06/2020 CLINICAL DATA:  Angiogram EXAM: DG C-ARM 1-60 MIN FLUOROSCOPY TIME:  Fluoroscopy Time:  3 minutes and 28 seconds Radiation Exposure Index (if provided by the fluoroscopic device): 74.67 mGy Number of Acquired Spot Images: 6 COMPARISON:  March 06, 2020 CT FINDINGS: The patient has undergone abdominal aortic angiogram. An endograft is noted. The left limb of the graft appears to be patent. Several images demonstrate non patency of the right limb. IMPRESSION: The patient has undergone angiogram. Please see separate operative report for complete intraoperative details. Electronically Signed   By: Constance Holster M.D.   On: 03/06/2020 19:50   ECHO INTRAOPERATIVE TEE  Result Date: 03/11/2020  *INTRAOPERATIVE TRANSESOPHAGEAL REPORT *  Patient Name:   [name]  Date of Exam: 03/11/2020 Medical Rec #:  [id number]      Height:       75.0 in Accession #:    [id number]     Weight:       194.0 lb Date of Birth:  23-Jul-1946       BSA:          2.17 m Patient Age:    52 years       BP:           137/74 mmHg Patient Gender: M              HR:           75 bpm. Exam  Location:  Anesthesiology Transesophogeal exam was perform intraoperatively during surgical procedure. Patient was closely monitored under general anesthesia during the entirety of examination. Indications:     left atrial thrombus Sonographer:     Jannett Celestine RDCS (AE) Performing Phys: Annye Asa MD Diagnosing Phys: Annye Asa MD Report CC'd to:  Servando Snare MD Complications: No known complications during this procedure. PRE-OP FINDINGS  Left Ventricle: The left ventricle has low normal systolic function, with an ejection fraction of 50-55% 52%. The cavity size was normal. There is no increase in left ventricular wall thickness. No evidence of left ventricular regional wall motion abnormalities. Right Ventricle: The right ventricle has normal systolic function. The cavity was normal. There is no increase in right ventricular wall thickness. Left Atrium: Left atrial size was mildly dilated. There is a very large, lobulated, mobile thrombus present in the L atrium. The atrial portion measures 2.58 x3.04 cm, however, the mass appears to extend well into the L superior pulmonary vein. It most likely obstructs flow from the L superior pulmonary vein. The thrombus does traverse the mitral apparatus during diastole, but it does not seem to affect forward flow. The left atrial appendage is well visualized and there is no evidence of thrombus present in it. Left atrial appendage velocity is normal at greater than 40 cm/s. Right Atrium: Right atrial size was normal in size. Right atrial pressure is estimated at 10 mmHg. Interatrial Septum: No atrial level shunt detected by color flow Doppler. Pericardium:  There is no evidence of pericardial effusion. Mitral Valve: The mitral valve is normal in structure. No thickening of the mitral valve leaflet. No calcification of the mitral valve leaflet. Mitral valve regurgitation is not visualized by color flow Doppler. There is no evidence of mitral valve vegetation. There is no evidence of mitral stenosis. Tricuspid Valve: The tricuspid valve was normal in structure. Tricuspid valve regurgitation is trivial by color flow Doppler. There is no evidence of tricuspid valve vegetation. Aortic Valve: The aortic valve is tricuspid Aortic valve regurgitation was not visualized by color flow Doppler. There is no evidence of aortic valve stenosis. There is no evidence of a vegetation on the aortic valve. Pulmonic Valve: The pulmonic valve was normal in structure, with normal leaflet excursion. No evidence of pulmonic stenosis. Pulmonic valve regurgitation is trivial by color flow Doppler. Aorta: The aortic root, ascending aorta and aortic arch are normal in size and structure. There is evidence of scattered layered plaque in the descending aorta; Grade I, measuring 1-83mm in size. Pulmonary Artery: The pulmonary artery is of normal size. Venous: The inferior vena cava was not well visualized, but appears normal in size. +--------------+-------++ LEFT VENTRICLE        +--------------+-------++ PLAX 2D               +--------------+-------++ LVIDd:        4.13 cm +--------------+-------++ LVIDs:        3.03 cm +--------------+-------++ LV SV:        40 ml   +--------------+-------++ LV SV Index:  18.36   +--------------+-------++                       +--------------+-------++ +-------------+-----------++ AORTIC VALVE             +-------------+-----------++ AV Vmax:     143.00 cm/s +-------------+-----------++ AV Vmean:    94.100 cm/s +-------------+-----------++ AV VTI:      0.208 m     +-------------+-----------++ AV Peak  Grad:8.2 mmHg     +-------------+-----------++ AV Mean Grad:4.0 mmHg    +-------------+-----------++ +-------------+---------++ MITRAL VALVE           +-------------+---------++ MV Peak grad:5.7 mmHg  +-------------+---------++ MV Mean grad:2.0 mmHg  +-------------+---------++ MV Vmax:     1.19 m/s  +-------------+---------++ MV Vmean:    68.8 cm/s +-------------+---------++ MV VTI:      0.32 m    +-------------+---------++  Annye Asa MD Electronically signed by Annye Asa MD Signature Date/Time: 03/11/2020/2:09:20 PM    Final    ECHO INTRAOPERATIVE TEE  Result Date: 03/07/2020  *INTRAOPERATIVE TRANSESOPHAGEAL REPORT *  Patient Name:   Winson Mannina Date of Exam: 03/06/2020 Medical Rec #:  [id number]     Height:       75.0 in Accession #:    [id number]    Weight:       174.2 lb Date of Birth:  April 01, 1946      BSA:          2.07 m Patient Age:    34 years      BP:           113/72 mmHg Patient Gender: M             HR:           83 bpm. Exam Location:  Inpatient Transesophogeal exam was perform intraoperatively during surgical procedure. Patient was closely monitored under general anesthesia during the entirety of examination. Indications:     Left atrial thrombus Performing Phys: [id number] Gwenyth Allegra CLARK Diagnosing Phys: Albertha Ghee MD Complications: No known complications during this procedure. POST-OP IMPRESSIONS Overall, there were no significant changes from pre-bypass. PRE-OP FINDINGS  Left Ventricle: The left ventricle has normal systolic function, with an ejection fraction of 55-60%. The cavity size was normal. There is mild concentric left ventricular hypertrophy. No evidence of left ventricular regional wall motion abnormalities. Right Ventricle: The right ventricle has normal systolic function. The cavity was normal. There is no increase in right ventricular wall thickness. Left Atrium: Left atrial size was normal in size. There is a large, mobile, lobulated mass in the LA. The  echogenic character is largely homogenous. The edges are smooth and rounded. This is likely a thrombus. It measures approximately 10cm x 5cm. It appears anchored deep within the LUPV and the remaining portion is freely mobile. The most distal portion of the mass extends through the mitral valve during diastole. No significant inflow obstruction is seen. The left atrial appendage is well visualized and there is no evidence of thrombus present. Right Atrium: Right atrial size was normal in size. Right atrial pressure is estimated at 10 mmHg. Interatrial Septum: No atrial level shunt detected by color flow Doppler. Pericardium: There is no evidence of pericardial effusion. Mitral Valve: The mitral valve is normal in structure. Mitral valve regurgitation is trivial by color flow Doppler. Tricuspid Valve: The tricuspid valve was normal in structure. Tricuspid valve regurgitation is trivial by color flow Doppler. Aortic Valve: The aortic valve is normal in structure. Aortic valve regurgitation was not visualized by color flow Doppler. There is no evidence of aortic valve stenosis. Pulmonic Valve: The pulmonic valve was normal in structure, with normal. Pulmonic valve regurgitation is not visualized by color flow Doppler. Aorta: The ascending aorta and aortic root are normal in size and structure. There is evidence of atheroma immobile plaque in the descending aorta and aortic arch; Grade IV, measuring >58mm in size.  Albertha Ghee MD Electronically signed by  Albertha Ghee MD Signature Date/Time: 03/07/2020/9:21:44 AM    Final    VAS US CAROTID  Result Date: 03/12/2020 Carotid Arterial Duplex Study Indications:       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 Risk Factors:      Hypertension, Diabetes, coronary artery disease. Comparison Study:  No prior studies. Performing Technologist: Darlin Coco  Examination Guidelines: A complete evaluation includes B-mode imaging, spectral Doppler, color Doppler, and power  Doppler as needed of all accessible portions of each vessel. Bilateral testing is considered an integral part of a complete examination. Limited examinations for reoccurring indications may be performed as noted.  Right Carotid Findings: +----------+--------+--------+--------+-----------------------+--------+           PSV cm/sEDV cm/sStenosisPlaque Description     Comments +----------+--------+--------+--------+-----------------------+--------+ CCA Prox  63      12              heterogenous and smooth         +----------+--------+--------+--------+-----------------------+--------+ CCA Mid   100     21              heterogenous and smooth         +----------+--------+--------+--------+-----------------------+--------+ CCA Distal80      13              heterogenous and smooth         +----------+--------+--------+--------+-----------------------+--------+ ICA Prox  47      13      1-39%   heterogenous                    +----------+--------+--------+--------+-----------------------+--------+ ICA Distal52      20                                              +----------+--------+--------+--------+-----------------------+--------+ ECA       51      9                                               +----------+--------+--------+--------+-----------------------+--------+ +----------+--------+-------+----------------+-------------------+           PSV cm/sEDV cmsDescribe        Arm Pressure (mmHG) +----------+--------+-------+----------------+-------------------+ Subclavian               Multiphasic, WNL                    +----------+--------+-------+----------------+-------------------+ +---------+--------+--+--------+--+---------+ VertebralPSV cm/s73EDV cm/s22Antegrade +---------+--------+--+--------+--+---------+  Left Carotid Findings: +----------+--------+--------+--------+-----------------------+--------+           PSV cm/sEDV cm/sStenosisPlaque  Description     Comments +----------+--------+--------+--------+-----------------------+--------+ CCA Prox  97      20              heterogenous                    +----------+--------+--------+--------+-----------------------+--------+ CCA Mid   93      19              heterogenous and smooth         +----------+--------+--------+--------+-----------------------+--------+ CCA Distal104     25              heterogenous and smooth         +----------+--------+--------+--------+-----------------------+--------+ ICA Prox  59  10      1-39%   heterogenous                    +----------+--------+--------+--------+-----------------------+--------+ ICA Distal92      34                                              +----------+--------+--------+--------+-----------------------+--------+ ECA       97      13                                              +----------+--------+--------+--------+-----------------------+--------+ +----------+--------+--------+----------------+-------------------+           PSV cm/sEDV cm/sDescribe        Arm Pressure (mmHG) +----------+--------+--------+----------------+-------------------+ Subclavian110             Multiphasic, WNL                    +----------+--------+--------+----------------+-------------------+ +---------+--------+--+--------+--+---------+ VertebralPSV cm/s93EDV cm/s29Antegrade +---------+--------+--+--------+--+---------+   Summary: Right Carotid: Velocities in the right ICA are consistent with a 1-39% stenosis.                Non-hemodynamically significant plaque <50% noted in the CCA. Left Carotid: Velocities in the left ICA are consistent with a 1-39% stenosis.               Non-hemodynamically significant plaque <50% noted in the CCA.  *See table(s) above for measurements and observations.  Electronically signed by Ruta Hinds MD on 03/12/2020 at 6:14:00 PM.    Final     Assessment/Plan  1.  Hallucinations, visual - will discontinue Oxycodone and continue PRN acetaminophen  2. AF (paroxysmal atrial fibrillation) (HCC) -Continue Lovenox  3. DM (diabetes mellitus), secondary, uncontrolled, with peripheral vascular complications (HCC) Lab Results  Component Value Date   HGBA1C 8.2 (H) 01/15/2020   - stable, continue Lantus and Metformin  4. 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 Occlusion of right femorotibial bypass graft, subsequent encounter -  S/P thrombectomy/4 compartment fasciotomy likely secondary to tumor embolization from large atrial thrombus/tumor due to a mass eroding into the mediastenum into the pulmonary vein and into the left atrium -7/23 thrombectomy/thrombosed biopsy confirms malignancy -Continue Lovenox -Deemed not an operative candidate  6. Chronic obstructive pulmonary disease, unspecified COPD type ([place]) -  No SOB/wheezing, continue PRN Albuterol and Incruse Ellipta     Family/ staff Communication: Discussed plan of care with resident and charge nurse.  Labs/tests ordered: None   Goals of care:   Short-term care   Durenda Age, DNP, MSN, FNP-BC [facility] [phone] (Monday-Friday 8:00 a.m. - 5:00 p.m.) [phone] (after hours)

## 2020-04-04 ENCOUNTER — Telehealth: Payer: Self-pay | Admitting: Hematology and Oncology

## 2020-04-04 ENCOUNTER — Telehealth: Payer: Self-pay | Admitting: *Deleted

## 2020-04-04 NOTE — Telephone Encounter (Signed)
Received call from pt's wife about setting up an appt for pt to be seen by Dr. [name] as an out patient for treatment options for his metastatic lung cancer. Pt is currently in [facility] for rehab. His wife is unclear as to when he will be discharged.  His wife does want him to be seen here if the [place] approves his cancer care here @ [place].  She states she cannot drive to [facility] so often for his care there. Advised that I would get a message to out new patient scheduler to get him an appt here and advise her about his [place] insurance  She voiced understanding

## 2020-04-04 NOTE — Telephone Encounter (Signed)
I cld [name] and explained that our office would need authorization in order for her husband to be seen at our office. She voiced understanding.

## 2020-04-07 ENCOUNTER — Other Ambulatory Visit: Payer: Self-pay

## 2020-04-07 NOTE — Telephone Encounter (Signed)
ERROR

## 2020-04-09 ENCOUNTER — Non-Acute Institutional Stay (SKILLED_NURSING_FACILITY): Payer: Medicare PPO | Admitting: Adult Health

## 2020-04-09 ENCOUNTER — Encounter: Payer: Self-pay | Admitting: Adult Health

## 2020-04-09 DIAGNOSIS — N400 Enlarged prostate without lower urinary tract symptoms: Secondary | ICD-10-CM | POA: Diagnosis not present

## 2020-04-09 DIAGNOSIS — I25118 Atherosclerotic heart disease of native coronary artery with other forms of angina pectoris: Secondary | ICD-10-CM

## 2020-04-09 DIAGNOSIS — T82898D Other specified complication of vascular prosthetic devices, implants and grafts, subsequent encounter: Secondary | ICD-10-CM | POA: Diagnosis not present

## 2020-04-09 DIAGNOSIS — C3402 Malignant neoplasm of left main bronchus: Secondary | ICD-10-CM

## 2020-04-09 DIAGNOSIS — E1351 Other specified diabetes mellitus with diabetic peripheral angiopathy without gangrene: Secondary | ICD-10-CM | POA: Diagnosis not present

## 2020-04-09 DIAGNOSIS — I480 Paroxysmal atrial fibrillation: Secondary | ICD-10-CM | POA: Diagnosis not present

## 2020-04-09 DIAGNOSIS — Z029 Encounter for administrative examinations, unspecified: Secondary | ICD-10-CM | POA: Diagnosis not present

## 2020-04-09 DIAGNOSIS — IMO0002 Reserved for concepts with insufficient information to code with codable children: Secondary | ICD-10-CM

## 2020-04-09 DIAGNOSIS — F339 Major depressive disorder, recurrent, unspecified: Secondary | ICD-10-CM | POA: Diagnosis not present

## 2020-04-09 DIAGNOSIS — J449 Chronic obstructive pulmonary disease, unspecified: Secondary | ICD-10-CM

## 2020-04-09 DIAGNOSIS — E1365 Other specified diabetes mellitus with hyperglycemia: Secondary | ICD-10-CM

## 2020-04-09 NOTE — Progress Notes (Signed)
Location:  [facility] Room Number: 216-A Place of Service:  SNF (31) Provider:  Durenda Age, DNP, FNP-BC  Patient Care Team: [place] as PCP - General ([place]) [name], MD as PCP - Cardiology (Cardiology) Patient, No Pcp Per (General Practice)  Extended Emergency Contact Information Primary Emergency Contact: Mcallister,Antionette Address: [address]          [place], [place] [address] [place] Phone: [phone] Mobile Phone: [phone] Relation: Spouse  Code Status:  FULL CODE  Goals of care: Advanced Directive information Advanced Directives 03/07/2020  Does Patient Have a Medical Advance Directive? No  Would patient like information on creating a medical advance directive? No - Patient declined     Chief Complaint  Patient presents with  . Discharge Planning    for probable discharge    HPI:  Pt is a 74 y.o. male seen today for discharge planning. He is a short-term care resident of [facility] and rehabilitation. He has a PMH of essential hypertension, diabetes mellitus complicated by PVD, history of renal calculi, CAD and history of asthma. Discharge is planned on 04/22/20. It is recommended for him to have DME: [employer]ielectric hospital bed, wheelchair and portable ramp.  Semielectric hospital bed -resident suffers from COPD and lung cancer and has trouble breathing at night when head is elevated less than 30 degrees or more.  Bed wedges do not provide enough elevation to resolve breathing issues.  Shortness of breath constipation to require frequent and immediate changes in body position which cannot be achieved with a normal bed.  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 His diagnosis  impairs his ability to perform daily activities like toileting, dressing, grooming and bathing in the home.  A cane or walker will not resolve issue with performing activities of daily living.  A wheelchair will allow resident to safely perform daily activities.  Resident has a caregiver who can provide assistance in propelling wheelchair.  He was admitted to [facility] on 03/19/2020 post hospitalization 03/06/2020 to 03/19/2020.  He presented to the hospital with right ischemic leg and was found to have occluded right/left limb of aorto right iliac endograft and now S/P emergent thrombectomy and RLE 4 compartment fasciotomy.  Further evaluation with intraoperative TEE showed a mobile left atrial thrombus.  Subsequent CT imaging study showed a large left lung mass eroding into the mediastinum into the pulmonary vein and into the left atrium.  Suspicion for tumor embolism causing ischemic leg.  In 2019, he was diagnosed with squamous cell carcinoma of the lung at [facility] for which he refused treatment   Past Medical History:  Diagnosis Date  . Abdominal aortic aneurysm without rupture ([place])   . Asthma   . Athscl heart disease of native coronary artery w/o ang pctrs   . Atrial fibrillation ([place])   . Chronic obstructive pulmonary disease (COPD) ([place])   . Coronary artery disease   . Diabetes mellitus without complication ([place])   . DM (diabetes mellitus) ([place])   . Esophageal reflux   . History of kidney stones   . Hypertension   . 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 Neuropathy   . Nicotine dependence   . OSA (obstructive sleep apnea)   . Osteoarthrosis   .  Other fatigue   . Pain in left knee   . Pain in left shoulder   . Partial edentulism   . Peripheral vascular disease ([place])   . Post traumatic stress disorder   . Pure hypercholesterolemia   . Respirator dependence, status ([place])   . RLS (restless legs syndrome)   . Stricture of artery [facility])    Past Surgical  History:  Procedure Laterality Date  . AORTOGRAM  03/06/2020   Procedure: AORTOGRAM WITH BILATERAL ILIAC ARTERIOGRAM;  Surgeon: Marty Heck, MD;  Location: [facility];  Service: Vascular;;  . CARDIAC CATHETERIZATION N/A 07/05/2016   Procedure: Left Heart Cath and Coronary Angiography;  Surgeon: Sherren Mocha, MD;  Location: [facility] CV LAB;  Service: Cardiovascular;  Laterality: N/A;  . CORONARY ANGIOPLASTY    . EMBOLECTOMY Bilateral 03/06/2020   Procedure: THROMBECTOMY OF RIGHT LIMB OF BIFURCATED AORTO-BI-ILIAC STENT GRAFT; RIGHT LOWER EXTREMITY THROMBECTOMY USING THE FEMORAL APPROACH; REDO EXPOSURE LEFT COMMON FEMORAL ARTERY; THROMBECTOMY LEFT LIMB OF AORTO-BI-ILIAC STENT GRAFT;  LEFT LOWER EXTREMITY THROMBECTOMY USING THE FEMORAL APPROACH;  Surgeon: Marty Heck, MD;  Location: [facility];  Service: Vascular;  Laterality: Bilateral;  . ENDOVASCULAR STENT INSERTION N/A 01/15/2020   Procedure: ENDOVASCULAR ANEURYSM REPAIR;  Surgeon: Waynetta Sandy, MD;  Location: [facility];  Service: Vascular;  Laterality: N/A;  . FASCIOTOMY Right 03/06/2020   Procedure: RIGHT LOWER EXTREMITY FOUR COMPARTMENT FASCIOTOMIES;  Surgeon: Marty Heck, MD;  Location: [facility];  Service: Vascular;  Laterality: Right;  . FASCIOTOMY CLOSURE Right 03/11/2020   Procedure: RIGHT LOWER EXTREMITY FASCIOTOMY WASHOUT AND CLOSURE;  Surgeon: Waynetta Sandy, MD;  Location: [facility];  Service: Vascular;  Laterality: Right;  . heart stents  2006  . INSERTION OF ILIAC STENT Left 01/15/2020   Procedure: INSERTION OF LEFT EXTERNAL ILIAC ARTERY STENT;  Surgeon: Waynetta Sandy, MD;  Location: [facility];  Service: Vascular;  Laterality: Left;  . LOWER EXTREMITY ANGIOGRAM  01/15/2020   Procedure: Left Lower Extremity Angiogram;  Surgeon: Waynetta Sandy, MD;  Location: [facility];  Service: Vascular;;  . TEE WITHOUT CARDIOVERSION  03/11/2020   Procedure: TRANSESOPHAGEAL ECHOCARDIOGRAM (TEE);  Surgeon:  Waynetta Sandy, MD;  Location: [facility];  Service: Vascular;;  . THROMBECTOMY ILIAC ARTERY  01/15/2020   Procedure: THROMBECTOMY OF LEFT ILIAC ARTERY;  Surgeon: Waynetta Sandy, MD;  Location: [facility];  Service: Vascular;;    Allergies  Allergen Reactions  . Erythromycin Shortness Of Breath and Swelling  . Simvastatin Other (See Comments)    CAUSE MUSCLE PAIN,.  . Statins Other (See Comments)    Muscle pain    Outpatient Encounter Medications as of 04/09/2020  Medication Sig  . acetaminophen (TYLENOL) 325 MG tablet Take 2 tablets (650 mg total) by mouth every 6 (six) hours as needed for mild pain (or temp >/= 101 F).  Marland Kitchen albuterol (PROVENTIL HFA;VENTOLIN HFA) 108 (90 BASE) MCG/ACT inhaler Inhale 2 puffs into the lungs every 6 (six) hours as needed for wheezing or shortness of breath.   . Calcium Carb-Cholecalciferol ([employer] CALCIUM 600 & VITAMIN D3) 600-800 MG-UNIT TABS Take 1 tablet by mouth daily.  . Calcium Carbonate-Simethicone (ALKA-SELTZER HEARTBURN + GAS PO) Take 2 each by mouth every 4 (four) hours as needed.  . clopidogrel (PLAVIX) 75 MG tablet Take 75 mg by mouth daily.  . Cyanocobalamin (VITAMIN B 12 PO) Take 500 mcg by mouth daily.   . diclofenac sodium (VOLTAREN) 1 % GEL Apply 4 g topically 3 (  three) times daily as needed (pain).   . DULoxetine (CYMBALTA) 30 MG capsule Take 30 mg by mouth daily.  Marland Kitchen enoxaparin (LOVENOX) 80 MG/0.8ML injection Inject 0.8 mLs (80 mg total) into the skin every 12 (twelve) hours.  . finasteride (PROSCAR) 5 MG tablet Take 5 mg by mouth daily.  . Glucerna (GLUCERNA) LIQD Take 237 mLs by mouth 2 (two) times daily between meals.  . hydroxypropyl methylcellulose / hypromellose (ISOPTO TEARS / GONIOVISC) 2.5 % ophthalmic solution Place 1-2 drops into both eyes in the morning and at bedtime.   . insulin glargine (LANTUS SOLOSTAR) 100 UNIT/ML Solostar Pen Inject 12 Units into the skin daily.  Marland Kitchen loratadine (CLARITIN) 10 MG tablet Take 10 mg by  mouth daily.  . metFORMIN (GLUCOPHAGE-XR) 500 MG 24 hr tablet Take 1,000 mg by mouth in the morning and at bedtime.   . nitroGLYCERIN (NITROSTAT) 0.4 MG SL tablet Place 0.4 mg under the tongue every 5 (five) minutes as needed for chest pain.  . Nutritional Supplement LIQD Take 120 mLs by mouth daily. MedPass   . Nutritional Supplements (NUTRITIONAL SUPPLEMENT PO) Take 1 each by mouth in the morning and at bedtime. Magic Cup  . Olodaterol HCl 2.5 MCG/ACT AERS Inhale 2 puffs into the lungs daily.  . pantoprazole (PROTONIX) 40 MG tablet Take 1 tablet (40 mg total) by mouth daily.  . polyethylene glycol (MIRALAX / GLYCOLAX) 17 g packet Take 17 g by mouth daily as needed for moderate constipation.  . tamsulosin (FLOMAX) 0.4 MG CAPS capsule Take 0.4 mg by mouth at bedtime.   Marland Kitchen umeclidinium bromide (INCRUSE ELLIPTA) 62.5 MCG/INH AEPB Inhale 1 puff into the lungs daily.  . [DISCONTINUED] Nystatin (GERHARDT'S BUTT CREAM) CREA Apply 1 application topically 3 (three) times daily. Apply in a thin layer to affected area in the gluteal cleft three times daily and PRN after defecation and cleansing.  . [DISCONTINUED] oxyCODONE (OXY IR/ROXICODONE) 5 MG immediate release tablet Take 1 tablet (5 mg total) by mouth every 6 (six) hours as needed for moderate pain.  . [DISCONTINUED] Zinc Oxide (BOUDREAUXS BUTT PASTE EX) Apply 1 application topically in the morning and at bedtime. Apply to coccyx and gluteal fold   No facility-administered encounter medications on file as of 04/09/2020.    Review of Systems  GENERAL: No fever, chills MOUTH and THROAT: Denies oral discomfort, gingival pain or bleeding RESPIRATORY: no cough, SOB, DOE, wheezing, hemoptysis CARDIAC: No chest pain, edema or palpitations GI: No abdominal pain, diarrhea, constipation, heart burn, nausea or vomiting GU: Denies dysuria, frequency, hematuria, incontinence, or discharge NEUROLOGICAL: Denies dizziness, syncope, numbness, or  headache PSYCHIATRIC: Denies feelings of depression or anxiety.    Immunization History  Administered Date(s) Administered  . PFIZER SARS-COV-2 Vaccination 12/06/2019, 12/27/2019   Pertinent  Health Maintenance Due  Topic Date Due  . OPHTHALMOLOGY EXAM  Never done  . URINE MICROALBUMIN  Never done  . COLONOSCOPY  Never done  . PNA vac Low Risk Adult (1 of 2 - PCV13) Never done  . INFLUENZA VACCINE  03/23/2020  . HEMOGLOBIN A1C  07/17/2020  . FOOT EXAM  11/13/2020    Vitals:   04/09/20 0851  BP: 124/82  Pulse: 98  Resp: 17  Temp: 97.9 F (36.6 C)  TempSrc: Oral  Weight: 173 lb 3.2 oz (78.6 kg)  Height: 6\' 3"  (1.905 m)   Body mass index is 21.65 kg/m.  Physical Exam  GENERAL APPEARANCE: Well nourished. Normal body habitus SKIN: Bilateral inguinal surgical wounds  dry, healed; right lower leg wound with dressing MOUTH and THROAT: Lips are without lesions. Oral mucosa is moist and without lesions. Tongue is normal in shape, size, and color and without lesions RESPIRATORY: +dyspnea, no wheezing CARDIAC: Irregularly irregular, no murmur,no extra heart sounds, no edema GI: Abdomen soft, normal BS, no masses, no tenderness EXTREMITIES:  Able to move X 4 extremities NEUROLOGICAL: There is no tremor. Speech is clear. Alert and oriented X 3. PSYCHIATRIC:  Affect and behavior are appropriate  Labs reviewed: Recent Labs    03/10/20 0234 03/10/20 0234 03/11/20 7893 03/11/20 0318 03/12/20 0251 03/12/20 0251 03/13/20 0304 03/15/20 0246 03/17/20 0152  NA 134*   < > 135   < > 136   < > 135 137 135  K 4.3   < > 3.7   < > 4.0   < > 3.6 4.0 3.9  CL 100   < > 102   < > 103   < > 102 104 102  CO2 27   < > 24   < > 24   < > 23 23 24   GLUCOSE 161*   < > 158*   < > 155*   < > 134* 142* 133*  BUN 12   < > 11   < > 12   < > 10 10 11   CREATININE 0.95   < > 0.93   < > 1.06   < > 0.99 0.92 1.08  CALCIUM 8.8*   < > 8.8*   < > 9.0   < > 8.8* 9.0 9.3  MG 1.9  --  2.0  --  1.8  --   --    --   --   PHOS 2.7   < > 2.7  --  2.9  --  2.5  --   --    < > = values in this interval not displayed.   Recent Labs    12/17/19 1312 01/16/20 1652 03/11/20 0318  AST 19 23 38  ALT 12 12 22   ALKPHOS 55 42 50  BILITOT 0.8 0.7 1.8*  PROT 6.8 5.0* 5.9*  ALBUMIN 3.7 2.6* 2.9*   Recent Labs    12/17/19 1312 12/17/19 1413 01/16/20 1652 01/17/20 0856 03/06/20 1417 03/06/20 1432 03/16/20 0244 03/17/20 0152 03/19/20 0119  WBC 6.8   < > 7.5   < > 9.5   < > 8.3 9.3 10.2  NEUTROABS 3.3  --  5.1  --  6.6  --   --   --   --   HGB 13.8   < > 10.4*   < > 10.9*   < > 9.0* 9.4* 9.5*  HCT 44.7   < > 32.4*   < > 35.8*   < > 28.5* 30.2* 29.2*  MCV 92.0   < > 89.5   < > 89.7   < > 87.2 87.0 86.9  PLT 152   < > 131*   < > 51*   < > 160 149* 128*   < > = values in this interval not displayed.   Lab Results  Component Value Date   TSH 1.786 07/06/2016   Lab Results  Component Value Date   HGBA1C 8.2 (H) 01/15/2020   Lab Results  Component Value Date   CHOL 124 03/07/2020   HDL 23 (L) 03/07/2020   LDLCALC 87 03/07/2020   TRIG 72 03/07/2020   CHOLHDL 5.4 03/07/2020    Significant Diagnostic Results in last 30 days:  CT CHEST W CONTRAST  Addendum Date: 03/14/2020   ADDENDUM REPORT: 03/14/2020 13:45 ADDENDUM: Thoracic surgeon Dr. Servando Snare called for a second opinion regarding the location of the cardiac mass. The irregular soft tissue mass within the left atrial appendage, left superior pulmonary vein and left aspect of the left atrium extending to mitral valve and potentially to the entrance of the left ventricle is contiguous with the large central left upper lobe lung mass and is favored to represent direct tumor invasion of these structures. While a component of bland thrombus is possible at the periphery of this cardiac mass, this is felt to largely represent direct cardiac tumor invasion. These results were discussed by telephone by Dr. Polly Cobia at 03/14/2020 at 1:44 pm with Dr. Servando Snare,  who verbally acknowledged these results. Electronically Signed   By: Ilona Sorrel M.D.   On: 03/14/2020 13:45   Result Date: 03/14/2020 CLINICAL DATA:  Abnormal x-ray findings. EXAM: CT CHEST WITH CONTRAST TECHNIQUE: Multidetector CT imaging of the chest was performed during intravenous contrast administration. CONTRAST:  64mL OMNIPAQUE IOHEXOL 300 MG/ML  SOLN COMPARISON:  March 12, 2020 FINDINGS: Cardiovascular: The pulmonary arteries are limited in evaluation secondary to suboptimal opacification with intravenous contrast. Normal heart size. A 3.6 cm x 2.4 cm area of low attenuation is seen within the anterior aspect of the right atrium. This extends from a large left suprahilar mass. Mild involvement of the left atrium is noted. No pericardial effusion. Mediastinum/Nodes: No enlarged mediastinal, hilar, or axillary lymph nodes. Thyroid gland, trachea, and esophagus demonstrate no significant findings. Lungs/Pleura: There is a 10.5 cm x 5.7 cm lobulated soft tissue mass seen along the left hilar and left suprahilar regions. There is no evidence of acute infiltrate, pleural effusion or pneumothorax. Upper Abdomen: There is evidence of prior stenting of the infrarenal abdominal aorta. Musculoskeletal: No chest wall abnormality. No acute or significant osseous findings. IMPRESSION: 1. 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 2. 3.6 cm x 2.4 cm area of low attenuation within the anterior aspect of the right atrium and adjacent portion of the left ventricle. While this is likely consistent with vascular invasion of the previously noted lung mass, a component of associated thrombus cannot be excluded. 3. Evidence of prior stenting of the infrarenal abdominal aorta. Electronically Signed: By: Virgina Norfolk M.D. On: 03/13/2020 19:05   CT CORONARY MORPH W/CTA COR W/SCORE W/CA W/CM &/OR WO/CM  Addendum Date: 03/13/2020   ADDENDUM REPORT: 03/13/2020 09:16 EXAM:  OVER-READ INTERPRETATION  CT CHEST The following report is an over-read performed by radiologist Dr. Norlene Duel [facility] on 03/13/2020. This over-read does not include interpretation of cardiac or coronary anatomy or pathology. The coronary CTA and coronary calcium score interpretation by the cardiologist is attached. COMPARISON:  CTA 01/16/2020 FINDINGS: Vascular: Normal heart size.  No pericardial effusion identified. Mediastinum/nodes: Dilated and patulous esophagus. Lungs/pleura: There is a large, left upper lobe, perihilar lung mass which measures 8.9 x 5.5 cm, image 3/11. This mass encases the left hilum and occludes the left upper lobe and lingular bronchi with signs of postobstructive pneumonitis in the left upper lobe. The mass invades the left upper lobe pulmonary vein with tumor thrombus identified in the left atrium. The tumor thrombus protrudes through the mitral valve into the left ventricle. Upper abdomen: No acute abnormality. Musculoskeletal: No acute abnormality. IMPRESSION: 1. 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 This encases the  left hilum and occludes the left upper lobe and lingular bronchi with signs of postobstructive pneumonitis in the left upper lobe. The mass invades the left upper lobe pulmonary vein with tumor thrombus in the left atrium, extending through the mitral valve. Recommend further evaluation with PET-CT. Electronically Signed   By: Kerby Moors M.D.   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 EXAM: Cardiac/Coronary  CTA TECHNIQUE: The patient was scanned on a [employer]. FINDINGS: A 100 kV prospective scan was triggered in the descending thoracic aorta at 111 HU's. Axial non-contrast 3 mm slices were carried out through  the heart. The data set was analyzed on a dedicated work station and scored using the [place]. Gantry rotation speed was 250 msecs and collimation was .6 mm. No beta blockade and 0.8 mg of sl NTG was given. The 3D data set was reconstructed in 5% intervals of the 67-82 % of the R-R cycle. Diastolic phases were analyzed on a dedicated work station using MPR, MIP and VRT modes. The patient received 80 cc of contrast. Aorta: Normal size. There is moderate to severe diffuse atherosclerotic plaque with mild calcifications. No dissection. Aortic Valve:  Trileaflet.  No calcifications. Coronary Arteries: Normal coronary origin. Right dominance. Coronary artery evaluation is affected by significant motion and poor vasodilation and therefore not interpretable. There are two stents in the proximal to mid RCA. 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 The thrombus is highly mobile with high risk of embolization. It is partially protruding into the left ventricle across the mitral valve and partially obstructing the flow to the left ventricle. The thrombus is only partially extending into the left atrial appendage. Dilated pulmonary artery suggestive of pulmonary hypertension. IMPRESSION: 1. Coronary Arteries: Normal coronary origin. Right dominance. Coronary artery evaluation is affected by significant motion and poor vasodilation and therefore not interpretable. There are two stents in the proximal to mid RCA. 2. There is moderate to severe diffuse atherosclerotic plaque with mild calcifications. No dissection. 3. 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 The thrombus is highly mobile with high risk of embolization. It is partially protruding into the left  ventricle across the mitral valve and partially obstructing the flow to the left ventricle. The thrombus is only partially extending into the left atrial appendage. 4. Dilated pulmonary artery suggestive of pulmonary hypertension. Electronically Signed: By: Ena Dawley On: 03/12/2020 18:57   CT CORONARY FRACTIONAL FLOW RESERVE DATA PREP  Result Date: 03/21/2020 EXAM: CT FFR ANALYSIS CLINICAL DATA:  74 year old male with abnormal CCTA. FINDINGS: FFRct analysis was performed on the original cardiac CT angiogram dataset. Diagrammatic representation of the FFRct analysis is provided in a separate PDF document in PACS. This dictation was created using the PDF document and an interactive 3D model of the results. 3D model is not available in the EMR/PACS. Normal FFR range is >0.80. 1. Left Main: 0.98. 2. LAD: Proximal: 0.96, mid: 0.90, distal (small lumen): 0.76. 3. LCX: 0.88. 4. OM1: 0.86 5. RCA: FFR analysis unavailable due to the presence of  a metallic stent. IMPRESSION: 1. CT FFR analysis didn't show any significant stenosis in the left main, proximal or mid LAD or LCX arteries. CT FFR analysis unavailable for RCA due to the presence of a metallic stent. Electronically Signed   By: Ena Dawley   On: 03/21/2020 12:23   ECHO INTRAOPERATIVE TEE  Result Date: 03/11/2020  *INTRAOPERATIVE TRANSESOPHAGEAL REPORT *  Patient Name:   Cindra Eves Kenton  Date of Exam: 03/11/2020 Medical Rec #:  [id number]      Height:       75.0 in Accession #:    [id number]     Weight:       194.0 lb Date of Birth:  12/01/1945       BSA:          2.17 m Patient Age:    22 years       BP:           137/74 mmHg Patient Gender: M              HR:           75 bpm. Exam Location:  Anesthesiology Transesophogeal exam was perform intraoperatively during surgical procedure. Patient was closely monitored under general anesthesia during the entirety of examination. Indications:     left atrial thrombus Sonographer:     Jannett Celestine RDCS (AE)  Performing Phys: Annye Asa MD Diagnosing Phys: Annye Asa MD Report CC'd to:  Servando Snare MD Complications: No known complications during this procedure. PRE-OP FINDINGS  Left Ventricle: The left ventricle has low normal systolic function, with an ejection fraction of 50-55% 52%. The cavity size was normal. There is no increase in left ventricular wall thickness. No evidence of left ventricular regional wall motion abnormalities. Right Ventricle: The right ventricle has normal systolic function. The cavity was normal. There is no increase in right ventricular wall thickness. Left Atrium: Left atrial size was mildly dilated. There is a very large, lobulated, mobile thrombus present in the L atrium. The atrial portion measures 2.58 x3.04 cm, however, the mass appears to extend well into the L superior pulmonary vein. It most likely obstructs flow from the L superior pulmonary vein. The thrombus does traverse the mitral apparatus during diastole, but it does not seem to affect forward flow. The left atrial appendage is well visualized and there is no evidence of thrombus present in it. Left atrial appendage velocity is normal at greater than 40 cm/s. Right Atrium: Right atrial size was normal in size. Right atrial pressure is estimated at 10 mmHg. Interatrial Septum: No atrial level shunt detected by color flow Doppler. Pericardium: There is no evidence of pericardial effusion. Mitral Valve: The mitral valve is normal in structure. No thickening of the mitral valve leaflet. No calcification of the mitral valve leaflet. Mitral valve regurgitation is not visualized by color flow Doppler. There is no evidence of mitral valve vegetation. There is no evidence of mitral stenosis. Tricuspid Valve: The tricuspid valve was normal in structure. Tricuspid valve regurgitation is trivial by color flow Doppler. There is no evidence of tricuspid valve vegetation. Aortic Valve: The aortic valve is tricuspid Aortic valve  regurgitation was not visualized by color flow Doppler. There is no evidence of aortic valve stenosis. There is no evidence of a vegetation on the aortic valve. Pulmonic Valve: The pulmonic valve was normal in structure, with normal leaflet excursion. No evidence of pulmonic stenosis. Pulmonic valve regurgitation is trivial by color flow Doppler. Aorta: The aortic root,  ascending aorta and aortic arch are normal in size and structure. There is evidence of scattered layered plaque in the descending aorta; Grade I, measuring 1-21mm in size. Pulmonary Artery: The pulmonary artery is of normal size. Venous: The inferior vena cava was not well visualized, but appears normal in size. +--------------+-------++ LEFT VENTRICLE        +--------------+-------++ PLAX 2D               +--------------+-------++ LVIDd:        4.13 cm +--------------+-------++ LVIDs:        3.03 cm +--------------+-------++ LV SV:        40 ml   +--------------+-------++ LV SV Index:  18.36   +--------------+-------++                       +--------------+-------++ +-------------+-----------++ AORTIC VALVE             +-------------+-----------++ AV Vmax:     143.00 cm/s +-------------+-----------++ AV Vmean:    94.100 cm/s +-------------+-----------++ AV VTI:      0.208 m     +-------------+-----------++ AV Peak Grad:8.2 mmHg    +-------------+-----------++ AV Mean Grad:4.0 mmHg    +-------------+-----------++ +-------------+---------++ MITRAL VALVE           +-------------+---------++ MV Peak grad:5.7 mmHg  +-------------+---------++ MV Mean grad:2.0 mmHg  +-------------+---------++ MV Vmax:     1.19 m/s  +-------------+---------++ MV Vmean:    68.8 cm/s +-------------+---------++ MV VTI:      0.32 m    +-------------+---------++  Annye Asa MD Electronically signed by Annye Asa MD Signature Date/Time: 03/11/2020/2:09:20 PM    Final    VAS US  CAROTID  Result Date: 03/12/2020 Carotid Arterial Duplex Study Indications:       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 Risk Factors:      Hypertension, Diabetes, coronary artery disease. Comparison Study:  No prior studies. Performing Technologist: Darlin Coco  Examination Guidelines: A complete evaluation includes B-mode imaging, spectral Doppler, color Doppler, and power Doppler as needed of all accessible portions of each vessel. Bilateral testing is considered an integral part of a complete examination. Limited examinations for reoccurring indications may be performed as noted.  Right Carotid Findings: +----------+--------+--------+--------+-----------------------+--------+           PSV cm/sEDV cm/sStenosisPlaque Description     Comments +----------+--------+--------+--------+-----------------------+--------+ CCA Prox  63      12              heterogenous and smooth         +----------+--------+--------+--------+-----------------------+--------+ CCA Mid   100     21              heterogenous and smooth         +----------+--------+--------+--------+-----------------------+--------+ CCA Distal80      13              heterogenous and smooth         +----------+--------+--------+--------+-----------------------+--------+ ICA Prox  47      13      1-39%   heterogenous                    +----------+--------+--------+--------+-----------------------+--------+ ICA Distal52      20                                              +----------+--------+--------+--------+-----------------------+--------+  ECA       51      9                                               +----------+--------+--------+--------+-----------------------+--------+ +----------+--------+-------+----------------+-------------------+           PSV cm/sEDV cmsDescribe        Arm Pressure (mmHG) +----------+--------+-------+----------------+-------------------+ Subclavian                Multiphasic, WNL                    +----------+--------+-------+----------------+-------------------+ +---------+--------+--+--------+--+---------+ VertebralPSV cm/s73EDV cm/s22Antegrade +---------+--------+--+--------+--+---------+  Left Carotid Findings: +----------+--------+--------+--------+-----------------------+--------+           PSV cm/sEDV cm/sStenosisPlaque Description     Comments +----------+--------+--------+--------+-----------------------+--------+ CCA Prox  97      20              heterogenous                    +----------+--------+--------+--------+-----------------------+--------+ CCA Mid   93      19              heterogenous and smooth         +----------+--------+--------+--------+-----------------------+--------+ CCA Distal104     25              heterogenous and smooth         +----------+--------+--------+--------+-----------------------+--------+ ICA Prox  59      10      1-39%   heterogenous                    +----------+--------+--------+--------+-----------------------+--------+ ICA Distal92      34                                              +----------+--------+--------+--------+-----------------------+--------+ ECA       97      13                                              +----------+--------+--------+--------+-----------------------+--------+ +----------+--------+--------+----------------+-------------------+           PSV cm/sEDV cm/sDescribe        Arm Pressure (mmHG) +----------+--------+--------+----------------+-------------------+ Subclavian110             Multiphasic, WNL                    +----------+--------+--------+----------------+-------------------+ +---------+--------+--+--------+--+---------+ VertebralPSV cm/s93EDV cm/s29Antegrade +---------+--------+--+--------+--+---------+   Summary: Right Carotid: Velocities in the right ICA are consistent with a 1-39% stenosis.                 Non-hemodynamically significant plaque <50% noted in the CCA. Left Carotid: Velocities in the left ICA are consistent with a 1-39% stenosis.               Non-hemodynamically significant plaque <50% noted in the CCA.  *See table(s) above for measurements and observations.  Electronically signed by Ruta Hinds MD on 03/12/2020 at 6:14:00 PM.    Final     Assessment/Plan  1. Discharge planning issues - discussed that he will be needing  Home health PT, OT and Nurse for wound treatment - DME: Semielectric hospital bed, wheelchair and ramp  2. 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 Malignant neoplasm of hilus of left lung ([place]) -   follow-up with oncology  4. AF (paroxysmal atrial fibrillation) (HCC) -Rate controlled,continue Lovenox  5. Chronic obstructive pulmonary disease, unspecified COPD type ([place]) -Stable, continue Incruse Ellipta INH, Striverdi Respimat INH and PRN Albuterol  6. DM (diabetes mellitus), secondary, uncontrolled, with peripheral vascular complications (HCC) Lab Results  Component Value Date   HGBA1C 8.2 (H) 01/15/2020   -Continue Metformin and Lantus  7. Benign prostatic hyperplasia without lower urinary tract symptoms -Continue finasteride and tamsulosin  8. CAD -Denies chest pain, continue Plavix  9.  Major depression, recurrent, chronic -Mood is stable, continue duloxetine   Family/ staff Communication: Discussed plan of care with resident and charge nurse.   Labs/tests ordered:  None   Goals of care:   Short-term care    Durenda Age, DNP, MSN, FNP-BC [facility] [phone] (Monday-Friday 8:00 a.m. -  5:00 p.m.) [phone] (after hours)

## 2020-04-11 ENCOUNTER — Encounter: Payer: Self-pay | Admitting: Vascular Surgery

## 2020-04-11 ENCOUNTER — Ambulatory Visit (INDEPENDENT_AMBULATORY_CARE_PROVIDER_SITE_OTHER): Payer: Medicare PPO | Admitting: Vascular Surgery

## 2020-04-11 VITALS — BP 113/78 | HR 90 | Temp 98.0°F | Resp 20 | Ht 75.0 in | Wt 173.0 lb

## 2020-04-11 DIAGNOSIS — I714 Abdominal aortic aneurysm, without rupture, unspecified: Secondary | ICD-10-CM

## 2020-04-11 DIAGNOSIS — Z4889 Encounter for other specified surgical aftercare: Secondary | ICD-10-CM

## 2020-04-11 NOTE — Progress Notes (Signed)
    Subjective:     Patient ID: [name], male   DOB: November 24, 1945, 74 y.o.   MRN: [id number]  HPI 74 year old male follows up for wound check after undergoing bilateral lower extremity thromboembolectomy of a previous endovascular aneurysm stent.  He was found to have an atrial thrombus for which she was placed on anticoagulation.  Fasciotomies were performed to the right lower extremity these were close but he did develop quite a bit of an eschar on the right medial and a small eschar on the right lateral.  Was also found to have left upper lobe mass invading into his atrial appendage was not a surgical candidate for this.  He remains in a nursing home is in a wheelchair.  States that his wounds are healing well.   Review of Systems Right lower extremity wounds healing well, right foot pain    Objective:   Physical Exam Vitals:   04/11/20 1040  BP: 113/78  Pulse: 90  Resp: 20  Temp: 98 F (36.7 C)  SpO2: 98%   Awake alert oriented Bilateral groin wounds healed well Both feet appear warm and well-perfused       Assessment:     74 year old male status post above-noted procedures.  Wounds progressing well.    Plan:     Follow-up 3 months with wound check and EVAR duplex with ABIs.  Lisamarie Coke C. Donzetta Matters, MD [facility] Office: [phone] Pager: [phone]

## 2020-04-14 ENCOUNTER — Non-Acute Institutional Stay (SKILLED_NURSING_FACILITY): Payer: Medicare PPO | Admitting: Adult Health

## 2020-04-14 ENCOUNTER — Encounter: Payer: Self-pay | Admitting: Adult Health

## 2020-04-14 DIAGNOSIS — E1365 Other specified diabetes mellitus with hyperglycemia: Secondary | ICD-10-CM | POA: Diagnosis not present

## 2020-04-14 DIAGNOSIS — R630 Anorexia: Secondary | ICD-10-CM

## 2020-04-14 DIAGNOSIS — E1351 Other specified diabetes mellitus with diabetic peripheral angiopathy without gangrene: Secondary | ICD-10-CM | POA: Diagnosis not present

## 2020-04-14 DIAGNOSIS — IMO0002 Reserved for concepts with insufficient information to code with codable children: Secondary | ICD-10-CM

## 2020-04-14 NOTE — Progress Notes (Signed)
Location:  [place] Room Number: 216-A Place of Service:  SNF (31) Provider:  Durenda Age, DNP, FNP-BC  Patient Care Team: [place] as PCP - General ([place]) [name], MD as PCP - Cardiology (Cardiology) Patient, No Pcp Per (General Practice)  Extended Emergency Contact Information Primary Emergency Contact: Leedy,Antionette Address: [address]          [place], [place] [address] [place] Phone: [phone] Mobile Phone: [phone] Relation: Spouse  Code Status:  FULL CODE  Goals of care: Advanced Directive information Advanced Directives 03/07/2020  Does Patient Have a Medical Advance Directive? No  Would patient like information on creating a medical advance directive? No - Patient declined     Chief Complaint  Patient presents with   Acute Visit    Patient is seen for poor oral intake    HPI:  Pt is a 74 y.o. male seen today for an acute visit secondary to poor oral intake.  He is a short-term rehabilitation resident of [facility].  He has a PMH of essential hypertension, diabetes mellitus complicated by PVD, history of renal calculi, CAD and history of asthma. He was seen in the room today. He is awake and verbally responsive. He was seen lying down in his bed and sat up upon seeing me/provider. He was noted to be somewhat dyspneic but on room air. PO intake reported to be 50 to 75%. CBGs ranging from 79 to 231. He currently takes Metformin 1000 mg twice a day and Lantus 12 units at bedtime for diabetes mellitus.   Past Medical History:  Diagnosis Date   Abdominal aortic aneurysm without rupture (HCC)    Asthma    Athscl heart disease of native coronary artery w/o ang pctrs    Atrial fibrillation (HCC)    Chronic obstructive pulmonary disease (COPD) ([place])    Coronary artery disease    Diabetes mellitus without complication (HCC)    DM (diabetes  mellitus) ([place])    Esophageal reflux    History of kidney stones    Hypertension    Neoplasm of upper lobe of left lung    Neuropathy    Nicotine dependence    OSA (obstructive sleep apnea)    Osteoarthrosis    Other fatigue    Pain in left knee    Pain in left shoulder    Partial edentulism    Peripheral vascular disease (HCC)    Post traumatic stress disorder    Pure hypercholesterolemia    Respirator dependence, status (HCC)    RLS (restless legs syndrome)    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 CV LAB;  Service: Cardiovascular;  Laterality: N/A;   CORONARY ANGIOPLASTY     EMBOLECTOMY Bilateral 03/06/2020   Procedure: THROMBECTOMY OF RIGHT LIMB OF BIFURCATED AORTO-BI-ILIAC STENT GRAFT; RIGHT LOWER EXTREMITY THROMBECTOMY USING THE FEMORAL APPROACH; REDO EXPOSURE LEFT COMMON FEMORAL ARTERY; THROMBECTOMY LEFT LIMB OF AORTO-BI-ILIAC STENT GRAFT;  LEFT LOWER EXTREMITY THROMBECTOMY USING THE FEMORAL APPROACH;  Surgeon: Marty Heck, MD;  Location: [place];  Service: Vascular;  Laterality: Bilateral;   ENDOVASCULAR STENT INSERTION N/A  01/15/2020   Procedure: ENDOVASCULAR ANEURYSM REPAIR;  Surgeon: Waynetta Sandy, MD;  Location: [place];  Service: Vascular;  Laterality: N/A;   FASCIOTOMY Right 03/06/2020   Procedure: RIGHT LOWER EXTREMITY FOUR COMPARTMENT FASCIOTOMIES;  Surgeon: Marty Heck, MD;  Location: [place];  Service: Vascular;  Laterality: Right;   FASCIOTOMY CLOSURE Right 03/11/2020   Procedure: RIGHT LOWER EXTREMITY FASCIOTOMY WASHOUT AND CLOSURE;  Surgeon: Waynetta Sandy, MD;   Location: [place];  Service: Vascular;  Laterality: Right;   heart stents  2006   INSERTION OF ILIAC STENT Left 01/15/2020   Procedure: INSERTION OF LEFT EXTERNAL ILIAC ARTERY STENT;  Surgeon: Waynetta Sandy, MD;  Location: [place];  Service: Vascular;  Laterality: Left;   LOWER EXTREMITY ANGIOGRAM  01/15/2020   Procedure: Left Lower Extremity Angiogram;  Surgeon: Waynetta Sandy, MD;  Location: [place];  Service: Vascular;;   TEE WITHOUT CARDIOVERSION  03/11/2020   Procedure: TRANSESOPHAGEAL ECHOCARDIOGRAM (TEE);  Surgeon: Waynetta Sandy, MD;  Location: [place];  Service: Vascular;;   THROMBECTOMY ILIAC ARTERY  01/15/2020   Procedure: THROMBECTOMY OF LEFT ILIAC ARTERY;  Surgeon: Waynetta Sandy, MD;  Location: [place];  Service: Vascular;;    Allergies  Allergen Reactions   Erythromycin Shortness Of Breath and Swelling   Simvastatin Other (See Comments)    CAUSE MUSCLE PAIN,.   Statins Other (See Comments)    Muscle pain    Outpatient Encounter Medications as of 04/14/2020  Medication Sig   acetaminophen (TYLENOL) 325 MG tablet Take 2 tablets (650 mg total) by mouth every 6 (six) hours as needed for mild pain (or temp >/= 101 F).   albuterol (PROVENTIL HFA;VENTOLIN HFA) 108 (90 BASE) MCG/ACT inhaler Inhale 2 puffs into the lungs every 6 (six) hours as needed for wheezing or shortness of breath.    Calcium Carb-Cholecalciferol ([employer] CALCIUM 600 & VITAMIN D3) 600-800 MG-UNIT TABS Take 1 tablet by mouth daily.   Calcium Carbonate-Simethicone (ALKA-SELTZER HEARTBURN + GAS PO) Take 2 each by mouth every 4 (four) hours as needed.   clopidogrel (PLAVIX) 75 MG tablet Take 75 mg by mouth daily.   Cyanocobalamin (VITAMIN B 12 PO) Take 500 mcg by mouth daily.    diclofenac sodium (VOLTAREN) 1 % GEL Apply 4 g topically 3 (three) times daily as needed (pain).    DULoxetine (CYMBALTA) 30 MG capsule Take 30 mg by mouth daily.   enoxaparin (LOVENOX) 80  MG/0.8ML injection Inject 0.8 mLs (80 mg total) into the skin every 12 (twelve) hours.   finasteride (PROSCAR) 5 MG tablet Take 5 mg by mouth daily.   Glucerna (GLUCERNA) LIQD Take 237 mLs by mouth 2 (two) times daily between meals.   hydroxypropyl methylcellulose / hypromellose (ISOPTO TEARS / GONIOVISC) 2.5 % ophthalmic solution Place 1-2 drops into both eyes in the morning and at bedtime.    insulin glargine (LANTUS SOLOSTAR) 100 UNIT/ML Solostar Pen Inject 12 Units into the skin daily.   loratadine (CLARITIN) 10 MG tablet Take 10 mg by mouth daily.   metFORMIN (GLUCOPHAGE-XR) 500 MG 24 hr tablet Take 1,000 mg by mouth in the morning and at bedtime.    nitroGLYCERIN (NITROSTAT) 0.4 MG SL tablet Place 0.4 mg under the tongue every 5 (five) minutes as needed for chest pain.   Nutritional Supplement LIQD Take 120 mLs by mouth daily. MedPass    Nutritional Supplements (NUTRITIONAL SUPPLEMENT PO) Take 1 each by mouth in the morning and at bedtime. Magic Cup  Olodaterol HCl 2.5 MCG/ACT AERS Inhale 2 puffs into the lungs daily.   pantoprazole (PROTONIX) 40 MG tablet Take 1 tablet (40 mg total) by mouth daily.   polyethylene glycol (MIRALAX / GLYCOLAX) 17 g packet Take 17 g by mouth daily as needed for moderate constipation.   tamsulosin (FLOMAX) 0.4 MG CAPS capsule Take 0.4 mg by mouth at bedtime.    umeclidinium bromide (INCRUSE ELLIPTA) 62.5 MCG/INH AEPB Inhale 1 puff into the lungs daily.   No facility-administered encounter medications on file as of 04/14/2020.    Review of Systems  GENERAL: poor appetite MOUTH and THROAT: Denies oral discomfort, gingival pain or bleeding RESPIRATORY: no cough, wheezing, hemoptysis CARDIAC: No chest pain, edema  GI: No abdominal pain, diarrhea, constipation, heart burn NEUROLOGICAL: Denies dizziness, syncope, numbness, or headache PSYCHIATRIC: Denies feelings of depression or anxiety. No report of hallucinations, insomnia, paranoia, or  agitation   Immunization History  Administered Date(s) Administered   PFIZER SARS-COV-2 Vaccination 12/06/2019, 12/27/2019   Pertinent  Health Maintenance Due  Topic Date Due   OPHTHALMOLOGY EXAM  Never done   URINE MICROALBUMIN  Never done   COLONOSCOPY  Never done   PNA vac Low Risk Adult (1 of 2 - PCV13) Never done   INFLUENZA VACCINE  03/23/2020   HEMOGLOBIN A1C  07/17/2020   FOOT EXAM  11/13/2020    Vitals:   04/14/20 1623  BP: 124/82  Pulse: 98  Resp: 17  Temp: (!) 97.2 F (36.2 C)  TempSrc: Oral  Weight: 173 lb 3.2 oz (78.6 kg)  Height: 6\' 3"  (1.905 m)   Body mass index is 21.65 kg/m.  Physical Exam  GENERAL APPEARANCE:  Normal body habitus SKIN:  Right lower leg wound covered with ace wrap MOUTH and THROAT: Lips are without lesions. Oral mucosa is moist and without lesions. Tongue is normal in shape, size, and color and without lesions RESPIRATORY: Dyspneic CARDIAC: Irregular, no murmur,no extra heart sounds, no edema GI: Abdomen soft, normal BS, no masses, no tenderness EXTREMITIES:  Able to move X 4 extremities NEUROLOGICAL: There is no tremor. Speech is clear. PSYCHIATRIC:  Affect and behavior are appropriate  Labs reviewed: Recent Labs    03/10/20 0234 03/10/20 0234 03/11/20 1443 03/11/20 1540 03/12/20 0251 03/12/20 0251 03/13/20 0304 03/15/20 0246 03/17/20 0152  NA 134*   < > 135   < > 136   < > 135 137 135  K 4.3   < > 3.7   < > 4.0   < > 3.6 4.0 3.9  CL 100   < > 102   < > 103   < > 102 104 102  CO2 27   < > 24   < > 24   < > 23 23 24   GLUCOSE 161*   < > 158*   < > 155*   < > 134* 142* 133*  BUN 12   < > 11   < > 12   < > 10 10 11   CREATININE 0.95   < > 0.93   < > 1.06   < > 0.99 0.92 1.08  CALCIUM 8.8*   < > 8.8*   < > 9.0   < > 8.8* 9.0 9.3  MG 1.9  --  2.0  --  1.8  --   --   --   --   PHOS 2.7   < > 2.7  --  2.9  --  2.5  --   --    < > =  values in this interval not displayed.   Recent Labs    12/17/19 1312  01/16/20 1652 03/11/20 0318  AST 19 23 38  ALT 12 12 22   ALKPHOS 55 42 50  BILITOT 0.8 0.7 1.8*  PROT 6.8 5.0* 5.9*  ALBUMIN 3.7 2.6* 2.9*   Recent Labs    12/17/19 1312 12/17/19 1413 01/16/20 1652 01/17/20 0856 03/06/20 1417 03/06/20 1432 03/16/20 0244 03/17/20 0152 03/19/20 0119  WBC 6.8   < > 7.5   < > 9.5   < > 8.3 9.3 10.2  NEUTROABS 3.3  --  5.1  --  6.6  --   --   --   --   HGB 13.8   < > 10.4*   < > 10.9*   < > 9.0* 9.4* 9.5*  HCT 44.7   < > 32.4*   < > 35.8*   < > 28.5* 30.2* 29.2*  MCV 92.0   < > 89.5   < > 89.7   < > 87.2 87.0 86.9  PLT 152   < > 131*   < > 51*   < > 160 149* 128*   < > = values in this interval not displayed.   Lab Results  Component Value Date   TSH 1.786 07/06/2016   Lab Results  Component Value Date   HGBA1C 8.2 (H) 01/15/2020   Lab Results  Component Value Date   CHOL 124 03/07/2020   HDL 23 (L) 03/07/2020   LDLCALC 87 03/07/2020   TRIG 72 03/07/2020   CHOLHDL 5.4 03/07/2020    Assessment/Plan  1. DM (diabetes mellitus), secondary, uncontrolled, with peripheral vascular complications (HCC) - will discontinue Lantus due to poor oral intake - Continue CBG checks -Continue Metformin 1000 mg twice a day  2. Poor appetite -Continue Glucerna and Magic cup supplementation    Family/ staff Communication: Discussed plan of care with resident and charge nurse.  Labs/tests ordered: None  Goals of care:   Short-term care   Durenda Age, DNP, MSN, FNP-BC [facility] [phone] (Monday-Friday 8:00 a.m. - 5:00 p.m.) [phone] (after hours)

## 2020-04-15 ENCOUNTER — Other Ambulatory Visit: Payer: Self-pay | Admitting: *Deleted

## 2020-04-15 DIAGNOSIS — Z8679 Personal history of other diseases of the circulatory system: Secondary | ICD-10-CM

## 2020-04-15 DIAGNOSIS — M7989 Other specified soft tissue disorders: Secondary | ICD-10-CM

## 2020-04-16 ENCOUNTER — Encounter: Payer: Self-pay | Admitting: Nurse Practitioner

## 2020-04-16 ENCOUNTER — Non-Acute Institutional Stay (SKILLED_NURSING_FACILITY): Payer: Medicare PPO | Admitting: Nurse Practitioner

## 2020-04-16 DIAGNOSIS — E1351 Other specified diabetes mellitus with diabetic peripheral angiopathy without gangrene: Secondary | ICD-10-CM | POA: Diagnosis not present

## 2020-04-16 DIAGNOSIS — R531 Weakness: Secondary | ICD-10-CM | POA: Diagnosis not present

## 2020-04-16 DIAGNOSIS — D649 Anemia, unspecified: Secondary | ICD-10-CM

## 2020-04-16 DIAGNOSIS — F339 Major depressive disorder, recurrent, unspecified: Secondary | ICD-10-CM | POA: Insufficient documentation

## 2020-04-16 DIAGNOSIS — E119 Type 2 diabetes mellitus without complications: Secondary | ICD-10-CM | POA: Diagnosis not present

## 2020-04-16 DIAGNOSIS — K5901 Slow transit constipation: Secondary | ICD-10-CM | POA: Diagnosis not present

## 2020-04-16 DIAGNOSIS — N400 Enlarged prostate without lower urinary tract symptoms: Secondary | ICD-10-CM

## 2020-04-16 DIAGNOSIS — I480 Paroxysmal atrial fibrillation: Secondary | ICD-10-CM

## 2020-04-16 DIAGNOSIS — D491 Neoplasm of unspecified behavior of respiratory system: Secondary | ICD-10-CM | POA: Diagnosis not present

## 2020-04-16 DIAGNOSIS — K219 Gastro-esophageal reflux disease without esophagitis: Secondary | ICD-10-CM | POA: Diagnosis not present

## 2020-04-16 DIAGNOSIS — I82409 Acute embolism and thrombosis of unspecified deep veins of unspecified lower extremity: Secondary | ICD-10-CM | POA: Insufficient documentation

## 2020-04-16 DIAGNOSIS — IMO0002 Reserved for concepts with insufficient information to code with codable children: Secondary | ICD-10-CM

## 2020-04-16 DIAGNOSIS — E1365 Other specified diabetes mellitus with hyperglycemia: Secondary | ICD-10-CM

## 2020-04-16 LAB — BASIC METABOLIC PANEL
BUN: 25 — AB (ref 4–21)
CO2: 22 (ref 13–22)
Chloride: 102 (ref 99–108)
Creatinine: 1 (ref 0.6–1.3)
Glucose: 151
Potassium: 3.5 (ref 3.4–5.3)
Sodium: 143 (ref 137–147)

## 2020-04-16 LAB — CBC AND DIFFERENTIAL
HCT: 33 — AB (ref 41–53)
Hemoglobin: 10.5 — AB (ref 13.5–17.5)
Neutrophils Absolute: 5
WBC: 7.9

## 2020-04-16 LAB — CBC: RBC: 3.94 (ref 3.87–5.11)

## 2020-04-16 LAB — COMPREHENSIVE METABOLIC PANEL
Albumin: 3.4 — AB (ref 3.5–5.0)
Calcium: 9.9 (ref 8.7–10.7)
GFR calc Af Amer: 90
GFR calc non Af Amer: 78.83
Globulin: 2.9

## 2020-04-16 LAB — HEPATIC FUNCTION PANEL
ALT: 9 — AB (ref 10–40)
AST: 22 (ref 14–40)
Alkaline Phosphatase: 85 (ref 25–125)
Bilirubin, Total: 0.5

## 2020-04-16 NOTE — Assessment & Plan Note (Signed)
Mood is stable, continue Fuloxetine.

## 2020-04-16 NOTE — Assessment & Plan Note (Signed)
Hgb 9.5 03/19/20, no active bleeding.

## 2020-04-16 NOTE — Assessment & Plan Note (Signed)
Continue prn MIraLax.

## 2020-04-16 NOTE — Assessment & Plan Note (Signed)
Heart rate 120bpm, denied chest pain, SOB, palpitation, declined ED eval, agree CBC/diff, CMP/eGFR

## 2020-04-16 NOTE — Assessment & Plan Note (Signed)
Continue Metformin, glucose 133 03/17/20

## 2020-04-16 NOTE — Assessment & Plan Note (Signed)
No urinary retention, continue Fainasteride, Tamsulosin.

## 2020-04-16 NOTE — Assessment & Plan Note (Signed)
Neoplasm of upper lobe of left lung[facility].

## 2020-04-16 NOTE — Progress Notes (Addendum)
Location:   [facility] Room Number: 216-A Place of Service:  SNF (31) Provider: [place]  Patient Care Team: [place] as PCP - General ([place]) [name], MD as PCP - Cardiology (Cardiology) Patient, No Pcp Per (General Practice)  Extended Emergency Contact Information Primary Emergency Contact: Stegenga,Antionette Address: [address]          [place], [place] [address] [place] Phone: [phone] Mobile Phone: [phone] Relation: Spouse  Code Status: DNR Goals of care: Advanced Directive information Advanced Directives 03/07/2020  Does Patient Have a Medical Advance Directive? No  Would patient like information on creating a medical advance directive? No - Patient declined     Chief Complaint  Patient presents with  . Acute Visit    Patient is seen for a fall.    HPI:  Pt is a 74 y.o. male seen today for an acute visit for generalized weakness, fall, lethargic, a few bout of diarrhea last week,  decreased oral intake, weight loss about #16Ibs/21 days. Afebrile, no O2 desaturation, no noted cough, SOB, nausea, vomiting, abd pain.   Hx of Asthma, Prn Albuterol HFA, Olodaterol qd, Umedidinium daily.   DVT R Leg, taking Plavix, Lovenox  Depression, takes Duloxetine  BPH taking Finasteride, Tamsulosin  T2DM taking Metformin, glucose 133 03/17/20  Anemia, Hgb 9.5 03/19/20  GERD takes Pantoprazole  Constipation prn MiraLax  Afib, heart rate 120bpm  [facility]    Past Medical History:  Diagnosis Date  . Abdominal aortic aneurysm without rupture ([place])   . Asthma   . Athscl heart disease of native coronary artery w/o ang pctrs   . Atrial fibrillation ([place])   . Chronic obstructive pulmonary disease (COPD) ([place])   . Coronary artery disease   . Diabetes mellitus without complication ([place])   . DM (diabetes mellitus) ([place])   . Esophageal  reflux   . History of kidney stones   . Hypertension   . 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 Neuropathy   . Nicotine dependence   . OSA (obstructive sleep apnea)   . Osteoarthrosis   . Other fatigue   . Pain in left knee   . Pain in left shoulder   . Partial edentulism   . Peripheral vascular disease ([place])   . Post traumatic stress disorder   . Pure hypercholesterolemia   . Respirator dependence, status ([place])   . RLS (restless legs syndrome)   . Stricture of artery [facility])    Past Surgical History:  Procedure Laterality Date  . AORTOGRAM  03/06/2020   Procedure: AORTOGRAM WITH BILATERAL ILIAC ARTERIOGRAM;  Surgeon: Marty Heck, MD;  Location: [facility];  Service: Vascular;;  . CARDIAC CATHETERIZATION N/A 07/05/2016   Procedure: Left Heart Cath and Coronary Angiography;  Surgeon: Sherren Mocha, MD;  Location: [facility] CV LAB;  Service: Cardiovascular;  Laterality: N/A;  . CORONARY ANGIOPLASTY    . EMBOLECTOMY Bilateral 03/06/2020   Procedure: THROMBECTOMY OF RIGHT LIMB OF BIFURCATED AORTO-BI-ILIAC STENT GRAFT; RIGHT LOWER EXTREMITY THROMBECTOMY USING THE FEMORAL APPROACH; REDO EXPOSURE LEFT COMMON FEMORAL ARTERY; THROMBECTOMY LEFT LIMB OF AORTO-BI-ILIAC STENT GRAFT;  LEFT LOWER EXTREMITY THROMBECTOMY USING THE FEMORAL APPROACH;  Surgeon: Marty Heck, MD;  Location: [facility];  Service: Vascular;  Laterality: Bilateral;  . ENDOVASCULAR STENT INSERTION N/A 01/15/2020   Procedure: ENDOVASCULAR ANEURYSM REPAIR;  Surgeon: Waynetta Sandy,  MD;  [facility];  Service: Vascular;  Laterality: N/A;  . FASCIOTOMY Right 03/06/2020   Procedure: RIGHT LOWER EXTREMITY FOUR COMPARTMENT FASCIOTOMIES;  Surgeon: Marty Heck, MD;  Location: [facility];  Service: Vascular;  Laterality: Right;  . FASCIOTOMY CLOSURE Right 03/11/2020   Procedure: RIGHT LOWER EXTREMITY FASCIOTOMY WASHOUT AND CLOSURE;  Surgeon: Waynetta Sandy, MD;  Location: [facility];  Service: Vascular;   Laterality: Right;  . heart stents  2006  . INSERTION OF ILIAC STENT Left 01/15/2020   Procedure: INSERTION OF LEFT EXTERNAL ILIAC ARTERY STENT;  Surgeon: Waynetta Sandy, MD;  Location: [facility];  Service: Vascular;  Laterality: Left;  . LOWER EXTREMITY ANGIOGRAM  01/15/2020   Procedure: Left Lower Extremity Angiogram;  Surgeon: Waynetta Sandy, MD;  Location: [facility];  Service: Vascular;;  . TEE WITHOUT CARDIOVERSION  03/11/2020   Procedure: TRANSESOPHAGEAL ECHOCARDIOGRAM (TEE);  Surgeon: Waynetta Sandy, MD;  Location: [facility];  Service: Vascular;;  . THROMBECTOMY ILIAC ARTERY  01/15/2020   Procedure: THROMBECTOMY OF LEFT ILIAC ARTERY;  Surgeon: Waynetta Sandy, MD;  Location: [facility];  Service: Vascular;;    Allergies  Allergen Reactions  . Erythromycin Shortness Of Breath and Swelling  . Simvastatin Other (See Comments)    CAUSE MUSCLE PAIN,.  . Statins Other (See Comments)    Muscle pain    Allergies as of 04/16/2020      Reactions   Erythromycin Shortness Of Breath, Swelling   Simvastatin Other (See Comments)   CAUSE MUSCLE PAIN,.   Statins Other (See Comments)   Muscle pain      Medication List       Accurate as of April 16, 2020 11:59 PM. If you have any questions, ask your nurse or doctor.        acetaminophen 325 MG tablet Commonly known as: TYLENOL Take 2 tablets (650 mg total) by mouth every 6 (six) hours as needed for mild pain (or temp >/= 101 F).   albuterol 108 (90 Base) MCG/ACT inhaler Commonly known as: VENTOLIN HFA Inhale 2 puffs into the lungs every 6 (six) hours as needed for wheezing or shortness of breath.   ALKA-SELTZER HEARTBURN + GAS PO Take 2 each by mouth every 4 (four) hours as needed.   clopidogrel 75 MG tablet Commonly known as: PLAVIX Take 75 mg by mouth daily.   CVS Calcium 600 & Vitamin D3 600-800 MG-UNIT Tabs Generic drug: Calcium Carb-Cholecalciferol Take 1 tablet by mouth daily.   diclofenac  sodium 1 % Gel Commonly known as: VOLTAREN Apply 4 g topically 3 (three) times daily as needed (pain).   DULoxetine 30 MG capsule Commonly known as: CYMBALTA Take 30 mg by mouth daily.   enoxaparin 80 MG/0.8ML injection Commonly known as: LOVENOX Inject 0.8 mLs (80 mg total) into the skin every 12 (twelve) hours.   finasteride 5 MG tablet Commonly known as: PROSCAR Take 5 mg by mouth daily.   hydroxypropyl methylcellulose / hypromellose 2.5 % ophthalmic solution Commonly known as: ISOPTO TEARS / GONIOVISC Place 1-2 drops into both eyes in the morning and at bedtime.   loperamide 2 MG tablet Commonly known as: IMODIUM A-D Take 2 mg by mouth 4 (four) times daily as needed for diarrhea or loose stools.   loratadine 10 MG tablet Commonly known as: CLARITIN Take 10 mg by mouth daily.   metFORMIN 500 MG 24 hr tablet Commonly known as: GLUCOPHAGE-XR Take 1,000 mg by mouth in the morning and at  bedtime.   nitroGLYCERIN 0.4 MG SL tablet Commonly known as: NITROSTAT Place 0.4 mg under the tongue every 5 (five) minutes as needed for chest pain.   Nutritional Supplement Liqd Take 120 mLs by mouth daily. MedPass   Glucerna Liqd Take 237 mLs by mouth 2 (two) times daily between meals.   NUTRITIONAL SUPPLEMENT PO Take 1 each by mouth in the morning and at bedtime. Magic Cup   Olodaterol HCl 2.5 MCG/ACT Aers Inhale 2 puffs into the lungs daily.   pantoprazole 40 MG tablet Commonly known as: Protonix Take 1 tablet (40 mg total) by mouth daily.   polyethylene glycol 17 g packet Commonly known as: MIRALAX / GLYCOLAX Take 17 g by mouth daily as needed for moderate constipation.   tamsulosin 0.4 MG Caps capsule Commonly known as: FLOMAX Take 0.4 mg by mouth at bedtime.   umeclidinium bromide 62.5 MCG/INH Aepb Commonly known as: INCRUSE ELLIPTA Inhale 1 puff into the lungs daily.   VITAMIN B 12 PO Take 500 mcg by mouth daily.       Review of Systems  Constitutional:  Positive for activity change, appetite change, fatigue and unexpected weight change. Negative for fever.       Weight loss #16Ibs/21 days  HENT: Negative for congestion and voice change.   Eyes: Negative for visual disturbance.  Respiratory: Negative for cough, shortness of breath and wheezing.   Cardiovascular: Negative for chest pain, palpitations and leg swelling.  Gastrointestinal: Positive for diarrhea. Negative for abdominal distention, abdominal pain, constipation, nausea and vomiting.  Genitourinary: Negative for difficulty urinating, dysuria, hematuria and urgency.  Musculoskeletal: Positive for arthralgias and gait problem.  Skin: Negative for color change.  Neurological: Negative for speech difficulty, weakness, light-headedness and headaches.  Psychiatric/Behavioral: Negative for behavioral problems, hallucinations and sleep disturbance. The patient is not nervous/anxious.     Immunization History  Administered Date(s) Administered  . Influenza-Unspecified 05/24/2019  . PFIZER SARS-COV-2 Vaccination 12/06/2019, 12/27/2019  . Pneumococcal Polysaccharide-23 01/22/2020   Pertinent  Health Maintenance Due  Topic Date Due  . OPHTHALMOLOGY EXAM  Never done  . URINE MICROALBUMIN  Never done  . COLONOSCOPY  Never done  . INFLUENZA VACCINE  03/23/2020  . HEMOGLOBIN A1C  07/17/2020  . FOOT EXAM  11/13/2020  . PNA vac Low Risk Adult (2 of 2 - PCV13) 01/21/2021   No flowsheet data found. Functional Status Survey:    Vitals:   04/16/20 1606 04/16/20 1609  BP: (!) 141/93 124/82  Pulse: 81   Resp: 18   Temp: 97.9 F (36.6 C)   TempSrc: Oral   Weight: 156 lb 12.8 oz (71.1 kg)   Height: _0  (1.905 m)    Body mass index is 19.6 kg/m. Physical Exam Vitals and nursing note reviewed.  Constitutional:      Comments: lethargic  HENT:     Head: Normocephalic and atraumatic.     Nose: Nose normal.     Mouth/Throat:     Mouth: Mucous membranes are dry.  Eyes:      Extraocular Movements: Extraocular movements intact.     Conjunctiva/sclera: Conjunctivae normal.     Pupils: Pupils are equal, round, and reactive to light.  Cardiovascular:     Rate and Rhythm: Regular rhythm. Tachycardia present.     Heart sounds: No murmur heard.   Pulmonary:     Breath sounds: Normal breath sounds. No wheezing, rhonchi or rales.  Abdominal:     General: Bowel sounds are normal. There is  no distension.     Palpations: Abdomen is soft.     Tenderness: There is no abdominal tenderness. There is no right CVA tenderness, left CVA tenderness or guarding.  Musculoskeletal:        General: Normal range of motion.     Cervical back: Normal range of motion and neck supple.     Right lower leg: No edema.     Left lower leg: No edema.  Skin:    General: Skin is warm and dry.  Neurological:     Mental Status: He is alert.     Motor: No weakness.     Coordination: Coordination normal.     Comments: Oriented to person, place.   Psychiatric:     Comments: Open eyes, follow simple directions     Labs reviewed: Recent Labs    03/10/20 0234 03/10/20 0234 03/11/20 9476 03/11/20 5465 03/12/20 0251 03/12/20 0251 03/13/20 0304 03/13/20 0304 03/15/20 0246 03/17/20 0152 04/16/20 0000  NA 134*   < > 135   < > 136   < > 135   < > 137 135 143  K 4.3   < > 3.7   < > 4.0   < > 3.6   < > 4.0 3.9 3.5  CL 100   < > 102   < > 103   < > 102   < > 104 102 102  CO2 27   < > 24   < > 24   < > 23   < > _0 GLUCOSE 161*   < > 158*   < > 155*   < > 134*  --  142* 133*  --   BUN 12   < > 11   < > 12   < > 10   < > 10 11 25*  CREATININE 0.95   < > 0.93   < > 1.06   < > 0.99   < > 0.92 1.08 1.0  CALCIUM 8.8*   < > 8.8*   < > 9.0   < > 8.8*   < > 9.0 9.3 9.9  MG 1.9  --  2.0  --  1.8  --   --   --   --   --   --   PHOS 2.7   < > 2.7  --  2.9  --  2.5  --   --   --   --    < > = values in this interval not displayed.   Recent Labs    12/17/19 1312 12/17/19 1312 01/16/20 1652  03/11/20 0318 04/16/20 0000  AST 19   < > 23 38 22  ALT 12   < > 12 22 9*  ALKPHOS 55   < > 42 50 85  BILITOT 0.8  --  0.7 1.8*  --   PROT 6.8  --  5.0* 5.9*  --   ALBUMIN 3.7   < > 2.6* 2.9* 3.4*   < > = values in this interval not displayed.   Recent Labs    01/16/20 1652 01/17/20 0856 03/06/20 1417 03/06/20 1432 03/16/20 0244 03/16/20 0244 03/17/20 0152 03/19/20 0119 04/16/20 0000  WBC 7.5   < > 9.5   < > 8.3   < > 9.3 10.2 7.9  NEUTROABS 5.1  --  6.6  --   --   --   --   --  5  HGB 10.4*   < >  10.9*   < > 9.0*   < > 9.4* 9.5* 10.5*  HCT 32.4*   < > 35.8*   < > 28.5*   < > 30.2* 29.2* 33*  MCV 89.5   < > 89.7   < > 87.2  --  87.0 86.9  --   PLT 131*   < > 51*   < > 160  --  149* 128*  --    < > = values in this interval not displayed.   Lab Results  Component Value Date   TSH 1.786 07/06/2016   Lab Results  Component Value Date   HGBA1C 8.2 (H) 01/15/2020   Lab Results  Component Value Date   CHOL 124 03/07/2020   HDL 23 (L) 03/07/2020   LDLCALC 87 03/07/2020   TRIG 72 03/07/2020   CHOLHDL 5.4 03/07/2020    Significant Diagnostic Results in last 30 days:  No results found.  Assessment/Plan: Generalized weakness generalized weakness, fall, lethargic, a few bout of diarrhea last week,  decreased oral intake, weight loss about #16Ibs/21 days. Afebrile, no O2 desaturation, no noted cough, SOB, nausea, vomiting, abd pain.  Stat CBC/diff, CMP/eGFR, HOPA the patient's wife delay's ED eval.    AF (paroxysmal atrial fibrillation) (HCC) Heart rate 120bpm, denied chest pain, SOB, palpitation, declined ED eval, agree CBC/diff, CMP/eGFR  DM (diabetes mellitus), secondary, uncontrolled, with peripheral vascular complications (HCC) Continue Metformin, glucose 133 03/17/20   Anemia Hgb 9.5 03/19/20, no active bleeding.   BPH (benign prostatic hyperplasia) No urinary retention, continue Fainasteride, Tamsulosin.   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    Slow transit constipation Continue prn MIraLax.   GERD (gastroesophageal reflux disease) Stable, continue Pantoprazole.   Depression, recurrent ([place]) Mood is stable, continue Fuloxetine.   DVT (deep venous thrombosis) (HCC) DVT R Leg, taking Plavix, Lovenox     Family/ staff Communication: plan of care reviewed with the patient, the patient's HPOA wife, and charge nurse.   Labs/tests ordered:  CBC/diff, CMP/eGFR stat  Time spend 35 minutes.

## 2020-04-16 NOTE — Assessment & Plan Note (Signed)
Stable, continue Pantoprazole.  

## 2020-04-16 NOTE — Assessment & Plan Note (Signed)
generalized weakness, fall, lethargic, a few bout of diarrhea last week,  decreased oral intake, weight loss about #16Ibs/21 days. Afebrile, no O2 desaturation, no noted cough, SOB, nausea, vomiting, abd pain.  Stat CBC/diff, CMP/eGFR, HOPA the patient's wife delay's ED eval.

## 2020-04-16 NOTE — Assessment & Plan Note (Signed)
DVT R Leg, taking Plavix, Lovenox

## 2020-04-16 NOTE — Progress Notes (Deleted)
Location:  [facility] Room Number: 216-A Place of Service:  SNF (31) Provider:  Jessicia Napolitano Darlina Rumpf, [place]  Patient Care Team: [place] as PCP - General ([place]) [name], MD as PCP - Cardiology (Cardiology) Patient, No Pcp Per (General Practice)  Extended Emergency Contact Information Primary Emergency Contact: Brownlow,Antionette Address: [address]          [place], [place] [address] [place] Phone: [phone] Mobile Phone: [phone] Relation: Spouse  Code Status:  FULL CODE Goals of care: Advanced Directive information Advanced Directives 03/07/2020  Does Patient Have a Medical Advance Directive? No  Would patient like information on creating a medical advance directive? No - Patient declined     Chief Complaint  Patient presents with  . Acute Visit    Patient is seen for a fall.    HPI:  Pt is a 74 y.o. male seen today for an acute visit for    Past Medical History:  Diagnosis Date  . Abdominal aortic aneurysm without rupture ([place])   . Asthma   . Athscl heart disease of native coronary artery w/o ang pctrs   . Atrial fibrillation ([place])   . Chronic obstructive pulmonary disease (COPD) ([place])   . Coronary artery disease   . Diabetes mellitus without complication ([place])   . DM (diabetes mellitus) ([place])   . Esophageal reflux   . History of kidney stones   . Hypertension   . 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 Neuropathy   . Nicotine dependence   . OSA (obstructive sleep apnea)   . Osteoarthrosis   . Other fatigue   . Pain in left knee   . Pain in left shoulder   . Partial edentulism   . Peripheral vascular disease ([place])   . Post traumatic stress disorder   . Pure hypercholesterolemia   . Respirator dependence, status ([place])   . RLS (restless legs syndrome)   . Stricture of artery [facility])    Past Surgical History:  Procedure Laterality Date  . AORTOGRAM  03/06/2020    Procedure: AORTOGRAM WITH BILATERAL ILIAC ARTERIOGRAM;  Surgeon: Marty Heck, MD;  Location: [facility];  Service: Vascular;;  . CARDIAC CATHETERIZATION N/A 07/05/2016   Procedure: Left Heart Cath and Coronary Angiography;  Surgeon: Sherren Mocha, MD;  Location: [facility];  Service: Cardiovascular;  Laterality: N/A;  . CORONARY ANGIOPLASTY    . EMBOLECTOMY Bilateral 03/06/2020   Procedure: THROMBECTOMY OF RIGHT LIMB OF BIFURCATED AORTO-BI-ILIAC STENT GRAFT; RIGHT LOWER EXTREMITY THROMBECTOMY USING THE FEMORAL APPROACH; REDO EXPOSURE LEFT COMMON FEMORAL ARTERY; THROMBECTOMY LEFT LIMB OF AORTO-BI-ILIAC STENT GRAFT;  LEFT LOWER EXTREMITY THROMBECTOMY USING THE FEMORAL APPROACH;  Surgeon: Marty Heck, MD;  Location: [facility];  Service: Vascular;  Laterality: Bilateral;  . ENDOVASCULAR STENT INSERTION N/A 01/15/2020   Procedure: ENDOVASCULAR ANEURYSM REPAIR;  Surgeon: Waynetta Sandy, MD;  Location: [facility];  Service: Vascular;  Laterality: N/A;  . FASCIOTOMY Right 03/06/2020   Procedure: RIGHT LOWER EXTREMITY FOUR COMPARTMENT FASCIOTOMIES;  Surgeon: Marty Heck, MD;  Location: [facility];  Service: Vascular;  Laterality: Right;  . FASCIOTOMY CLOSURE Right 03/11/2020   Procedure: RIGHT LOWER EXTREMITY FASCIOTOMY WASHOUT AND CLOSURE;  Surgeon: Waynetta Sandy, MD;  Location: [facility];  Service: Vascular;  Laterality: Right;  . heart stents  2006  . INSERTION OF ILIAC STENT Left 01/15/2020   Procedure: INSERTION OF LEFT EXTERNAL  ILIAC ARTERY STENT;  Surgeon: Waynetta Sandy, MD;  Location: [facility];  Service: Vascular;  Laterality: Left;  . LOWER EXTREMITY ANGIOGRAM  01/15/2020   Procedure: Left Lower Extremity Angiogram;  Surgeon: Waynetta Sandy, MD;  Location: [facility];  Service: Vascular;;  . TEE WITHOUT CARDIOVERSION  03/11/2020   Procedure: TRANSESOPHAGEAL ECHOCARDIOGRAM (TEE);  Surgeon: Waynetta Sandy, MD;  Location: [facility];  Service:  Vascular;;  . THROMBECTOMY ILIAC ARTERY  01/15/2020   Procedure: THROMBECTOMY OF LEFT ILIAC ARTERY;  Surgeon: Waynetta Sandy, MD;  Location: [facility];  Service: Vascular;;    Allergies  Allergen Reactions  . Erythromycin Shortness Of Breath and Swelling  . Simvastatin Other (See Comments)    CAUSE MUSCLE PAIN,.  . Statins Other (See Comments)    Muscle pain    Outpatient Encounter Medications as of 04/16/2020  Medication Sig  . acetaminophen (TYLENOL) 325 MG tablet Take 2 tablets (650 mg total) by mouth every 6 (six) hours as needed for mild pain (or temp >/= 101 F).  Marland Kitchen albuterol (PROVENTIL HFA;VENTOLIN HFA) 108 (90 BASE) MCG/ACT inhaler Inhale 2 puffs into the lungs every 6 (six) hours as needed for wheezing or shortness of breath.   . Calcium Carb-Cholecalciferol (CVS CALCIUM 600 & VITAMIN D3) 600-800 MG-UNIT TABS Take 1 tablet by mouth daily.  . Calcium Carbonate-Simethicone (ALKA-SELTZER HEARTBURN + GAS PO) Take 2 each by mouth every 4 (four) hours as needed.  . clopidogrel (PLAVIX) 75 MG tablet Take 75 mg by mouth daily.  . Cyanocobalamin (VITAMIN B 12 PO) Take 500 mcg by mouth daily.   . diclofenac sodium (VOLTAREN) 1 % GEL Apply 4 g topically 3 (three) times daily as needed (pain).   . DULoxetine (CYMBALTA) 30 MG capsule Take 30 mg by mouth daily.  Marland Kitchen enoxaparin (LOVENOX) 80 MG/0.8ML injection Inject 0.8 mLs (80 mg total) into the skin every 12 (twelve) hours.  . finasteride (PROSCAR) 5 MG tablet Take 5 mg by mouth daily.  . Glucerna (GLUCERNA) LIQD Take 237 mLs by mouth 2 (two) times daily between meals.  . hydroxypropyl methylcellulose / hypromellose (ISOPTO TEARS / GONIOVISC) 2.5 % ophthalmic solution Place 1-2 drops into both eyes in the morning and at bedtime.   Marland Kitchen loperamide (IMODIUM A-D) 2 MG tablet Take 2 mg by mouth 4 (four) times daily as needed for diarrhea or loose stools.  Marland Kitchen loratadine (CLARITIN) 10 MG tablet Take 10 mg by mouth daily.  . metFORMIN  (GLUCOPHAGE-XR) 500 MG 24 hr tablet Take 1,000 mg by mouth in the morning and at bedtime.   . nitroGLYCERIN (NITROSTAT) 0.4 MG SL tablet Place 0.4 mg under the tongue every 5 (five) minutes as needed for chest pain.  . Nutritional Supplement LIQD Take 120 mLs by mouth daily. MedPass   . Nutritional Supplements (NUTRITIONAL SUPPLEMENT PO) Take 1 each by mouth in the morning and at bedtime. Magic Cup  . Olodaterol HCl 2.5 MCG/ACT AERS Inhale 2 puffs into the lungs daily.  . pantoprazole (PROTONIX) 40 MG tablet Take 1 tablet (40 mg total) by mouth daily.  . polyethylene glycol (MIRALAX / GLYCOLAX) 17 g packet Take 17 g by mouth daily as needed for moderate constipation.  . tamsulosin (FLOMAX) 0.4 MG CAPS capsule Take 0.4 mg by mouth at bedtime.   Marland Kitchen umeclidinium bromide (INCRUSE ELLIPTA) 62.5 MCG/INH AEPB Inhale 1 puff into the lungs daily.   No facility-administered encounter medications on file as of 04/16/2020.    Review of Systems  Immunization History  Administered Date(s) Administered  . Influenza-Unspecified 05/24/2019  . PFIZER SARS-COV-2 Vaccination 12/06/2019, 12/27/2019  . Pneumococcal Polysaccharide-23 01/22/2020   Pertinent  Health Maintenance Due  Topic Date Due  . OPHTHALMOLOGY EXAM  Never done  . URINE MICROALBUMIN  Never done  . COLONOSCOPY  Never done  . INFLUENZA VACCINE  03/23/2020  . HEMOGLOBIN A1C  07/17/2020  . FOOT EXAM  11/13/2020  . PNA vac Low Risk Adult (2 of 2 - PCV13) 01/21/2021   No flowsheet data found. Functional Status Survey:    Vitals:   04/16/20 1606 04/16/20 1609  BP: (!) 141/93 124/82  Pulse: 81   Resp: 18   Temp: 97.9 F (36.6 C)   TempSrc: Oral   Weight: 156 lb 12.8 oz (71.1 kg)   Height: 6\' 3"  (1.905 m)    Body mass index is 19.6 kg/m. Physical Exam  Labs reviewed: Recent Labs    03/10/20 0234 03/10/20 0234 03/11/20 5093 03/11/20 2671 03/12/20 0251 03/12/20 0251 03/13/20 0304 03/15/20 0246 03/17/20 0152  NA 134*   < >  135   < > 136   < > 135 137 135  K 4.3   < > 3.7   < > 4.0   < > 3.6 4.0 3.9  CL 100   < > 102   < > 103   < > 102 104 102  CO2 27   < > 24   < > 24   < > 23 23 24   GLUCOSE 161*   < > 158*   < > 155*   < > 134* 142* 133*  BUN 12   < > 11   < > 12   < > 10 10 11   CREATININE 0.95   < > 0.93   < > 1.06   < > 0.99 0.92 1.08  CALCIUM 8.8*   < > 8.8*   < > 9.0   < > 8.8* 9.0 9.3  MG 1.9  --  2.0  --  1.8  --   --   --   --   PHOS 2.7   < > 2.7  --  2.9  --  2.5  --   --    < > = values in this interval not displayed.   Recent Labs    12/17/19 1312 01/16/20 1652 03/11/20 0318  AST 19 23 38  ALT 12 12 22   ALKPHOS 55 42 50  BILITOT 0.8 0.7 1.8*  PROT 6.8 5.0* 5.9*  ALBUMIN 3.7 2.6* 2.9*   Recent Labs    12/17/19 1312 12/17/19 1413 01/16/20 1652 01/17/20 0856 03/06/20 1417 03/06/20 1432 03/16/20 0244 03/17/20 0152 03/19/20 0119  WBC 6.8   < > 7.5   < > 9.5   < > 8.3 9.3 10.2  NEUTROABS 3.3  --  5.1  --  6.6  --   --   --   --   HGB 13.8   < > 10.4*   < > 10.9*   < > 9.0* 9.4* 9.5*  HCT 44.7   < > 32.4*   < > 35.8*   < > 28.5* 30.2* 29.2*  MCV 92.0   < > 89.5   < > 89.7   < > 87.2 87.0 86.9  PLT 152   < > 131*   < > 51*   < > 160 149* 128*   < > = values in this interval not displayed.  Lab Results  Component Value Date   TSH 1.786 07/06/2016   Lab Results  Component Value Date   HGBA1C 8.2 (H) 01/15/2020   Lab Results  Component Value Date   CHOL 124 03/07/2020   HDL 23 (L) 03/07/2020   LDLCALC 87 03/07/2020   TRIG 72 03/07/2020   CHOLHDL 5.4 03/07/2020    Significant Diagnostic Results in last 30 days:  No results found.  Assessment/Plan There are no diagnoses linked to this encounter.   Family/ staff Communication: ***  Labs/tests ordered:  ***

## 2020-04-17 ENCOUNTER — Non-Acute Institutional Stay (SKILLED_NURSING_FACILITY): Payer: Medicare PPO | Admitting: Internal Medicine

## 2020-04-17 ENCOUNTER — Encounter: Payer: Self-pay | Admitting: Internal Medicine

## 2020-04-17 DIAGNOSIS — I513 Intracardiac thrombosis, not elsewhere classified: Secondary | ICD-10-CM | POA: Diagnosis not present

## 2020-04-17 DIAGNOSIS — IMO0002 Reserved for concepts with insufficient information to code with codable children: Secondary | ICD-10-CM

## 2020-04-17 DIAGNOSIS — E1351 Other specified diabetes mellitus with diabetic peripheral angiopathy without gangrene: Secondary | ICD-10-CM | POA: Diagnosis not present

## 2020-04-17 DIAGNOSIS — D491 Neoplasm of unspecified behavior of respiratory system: Secondary | ICD-10-CM | POA: Diagnosis not present

## 2020-04-17 DIAGNOSIS — D649 Anemia, unspecified: Secondary | ICD-10-CM

## 2020-04-17 DIAGNOSIS — G8929 Other chronic pain: Secondary | ICD-10-CM | POA: Diagnosis not present

## 2020-04-17 DIAGNOSIS — I480 Paroxysmal atrial fibrillation: Secondary | ICD-10-CM | POA: Diagnosis not present

## 2020-04-17 DIAGNOSIS — E1365 Other specified diabetes mellitus with hyperglycemia: Secondary | ICD-10-CM | POA: Diagnosis not present

## 2020-04-17 DIAGNOSIS — M25561 Pain in right knee: Secondary | ICD-10-CM

## 2020-04-17 NOTE — Assessment & Plan Note (Signed)
Actual CT images personally reviewed revealing the left hilar and suprahilar mass.  Oncology has determined he is not a surgical candidate.  There would be no need to subject him to transport to the [place] [facility] in [place] based on this determination.  Hospice care will be discussed with his wife.

## 2020-04-17 NOTE — Assessment & Plan Note (Addendum)
No bleeding dyscrasias reported.  Hemoglobin & hematocrit have improved despite his progressive deterioration.

## 2020-04-17 NOTE — Assessment & Plan Note (Signed)
04/17/2020 clinically atrial fibrillation with rapid ventricular response.  Titration of beta-blocker therapy will be initiated as allowed by his blood pressure.  Lovenox will be continued.

## 2020-04-17 NOTE — Assessment & Plan Note (Addendum)
Reviewing the serial ECHOs does suggest that the thrombus decreased in size.  The Lovenox may help prevent formation of new thrombi; but the etiology of the coagulopathy has not been addressed, i.e. the primary lung cancer.

## 2020-04-17 NOTE — Progress Notes (Signed)
NURSING HOME LOCATION:  [facility] ROOM NUMBER:  216-A  CODE STATUS:  FULL CODE  PCP:  [place] [id number]   This is a nursing facility follow up for specific acute issue of failure to thrive in the context of primary lung cancer.   Interim medical record and care since last [place] visit was updated with review of diagnostic studies and change in clinical status since last visit were documented.  HPI: He was seen yesterday by the NP because of failure to thrive with associated 16 pound weight loss over the last 3 weeks as well as tachycardia.  His wife declined an ED visit but requested labs and follow-up by a physician. Labs performed 8/25 were reviewed.  There has been an improvement in his normochromic, normocytic anemia with hemoglobin 10.5/hematocrit 32.6.  Prior values were 9.5/29.2.  Differential was normal.  Complete metabolic panel revealed no clinically significant abnormalities.  Glucose was up slightly from 133 to 151.  Fasting blood sugars have ranged from 91 up to 200.  The latter is an outlier.  Evening glucoses range from 92-154 indicating no significant persistent hyperglycemia.   He had mild dehydration suggested by a BUN of 25.2.  Protein malnutrition was mild with an albumin of 3.4.  All other labs were normal. I reviewed the echocardiogram and CT results from his most recent hospitalization.  On 7/15 the thrombus in the left atrium appeared to be 10 x 5 cm and originated from the left upper pulmonary vein.  On repeat echo 7/20 there was a mobile thrombus present measuring 2.58 x 3.04 cm extending from the left pulmonary vein.  This was in the context of him being on Lovenox prophylaxis.  Eliquis has been initiated when he had PAF with RVR but was changed to Lovenox.  Review of systems: Initially he denied any pain but subsequently stated that he was having pain in his right knee.  He also validated some shortness  of breath.  He denied any other active symptoms.  His profound lethargy hindered obtaining a detailed history as he kept falling asleep.  Physical exam:  Pertinent or positive findings: He is markedly lethargic but wakes to some degree.  His speech is somewhat slurred and difficult to discern.  Pattern alopecia is present.  He has a beard and mustache.  Muscular wasting of the temples is suggested.  Arcus senilis is noted bilaterally.  He has rub like rhonchi over the left anterior chest.  Tachycardia is present with irregular rhythm and accentuated first heart sound.  Pedal pulses are decreased.  The right lower extremity is wrapped.  General appearance: no acute distress, increased work of breathing is present.   Lymphatic: No lymphadenopathy about the head, neck, axilla. Eyes: No conjunctival inflammation or lid edema is present. There is no scleral icterus. Ears:  External ear exam shows no significant lesions or deformities.   Nose:  External nasal examination shows no deformity or inflammation. Nasal mucosa are pink and moist without lesions, exudates Oral exam:  Lips and gums are healthy appearing. Neck:  No thyromegaly, masses, tenderness noted.    Heart:  murmur, click, rub .  Lungs:  without wheezes, rales, rubs. Abdomen: Bowel sounds are normal. Abdomen is soft and nontender with no organomegaly, hernias, masses. GU: Deferred  Extremities:  No cyanosis, clubbing, edema  Neurologic exam :Balance, Rhomberg, finger to nose testing could not be completed due to clinical state Skin: Warm & dry  w/o tenting. No significant visable rash.  See summary under each active problem in the Problem List with associated updated therapeutic plan

## 2020-04-17 NOTE — Assessment & Plan Note (Signed)
Serial glucoses were reviewed and no persistent hyperglycemia documented.  Because of poor nutritional intake; he is at high risk for hypoglycemia with aggressive treatment of diabetes.

## 2020-04-17 NOTE — Assessment & Plan Note (Addendum)
Most likely this is related to the peripheral coagulopathy as examination of the knee is benign.  Maintenance Voltaren will be initiated to see if it is of any benefit.

## 2020-04-17 NOTE — Patient Instructions (Signed)
See assessment and plan under each diagnosis in the problem list and acutely for this visit 

## 2020-04-18 ENCOUNTER — Ambulatory Visit: Payer: Medicare PPO | Admitting: Interventional Cardiology

## 2020-04-21 ENCOUNTER — Non-Acute Institutional Stay (SKILLED_NURSING_FACILITY): Payer: Medicare PPO | Admitting: Adult Health

## 2020-04-21 ENCOUNTER — Encounter: Payer: Self-pay | Admitting: Adult Health

## 2020-04-21 DIAGNOSIS — I480 Paroxysmal atrial fibrillation: Secondary | ICD-10-CM

## 2020-04-21 DIAGNOSIS — J449 Chronic obstructive pulmonary disease, unspecified: Secondary | ICD-10-CM | POA: Diagnosis not present

## 2020-04-21 DIAGNOSIS — N400 Enlarged prostate without lower urinary tract symptoms: Secondary | ICD-10-CM | POA: Diagnosis not present

## 2020-04-21 DIAGNOSIS — T82898D Other specified complication of vascular prosthetic devices, implants and grafts, subsequent encounter: Secondary | ICD-10-CM | POA: Diagnosis not present

## 2020-04-21 DIAGNOSIS — D491 Neoplasm of unspecified behavior of respiratory system: Secondary | ICD-10-CM | POA: Diagnosis not present

## 2020-04-21 NOTE — Progress Notes (Signed)
Location:  [facility] Room Number: 216-A Place of Service:  SNF (31) Provider:  Durenda Age, DNP, FNP-BC  Patient Care Team: [place] as PCP - General ([facility]) [name], MD as PCP - Cardiology (Cardiology) Patient, No Pcp Per (General Practice)  Extended Emergency Contact Information Primary Emergency Contact: Canny,Antionette Address: [address]          [place], [place] [address] [place] Phone: [phone] Mobile Phone: [phone] Relation: Spouse  Code Status:  FULL CODE  Goals of care: Advanced Directive information Advanced Directives 03/07/2020  Does Patient Have a Medical Advance Directive? No  Would patient like information on creating a medical advance directive? No - Patient declined     Chief Complaint  Patient presents with  . Discharge Note    Wife requesting transfer to hospice    HPI:  Pt is a 74 y.o. male who was supposed to be discharged home but wife is now requesting resident to transfer to [facility].  He was admitted to [facility] on 03/19/20 post hospitalization 03/06/2020 to 03/19/2020.  He presented to the hospital with right ischemic leg and was found to have occluded right/left limb of aorto-biiliac endograft and now S/P emergent thrombectomy and RLE 4 compartment fasciotomy.  Further evaluation with intraoperative TEE showed a mobile left atrial thrombus.  Subsequent CT imaging study showed a large left lung mass-eroding into the mediastinum into the pulmonary vein and into the left atrium.  Suspicion for tumor embolism causing ischemic leg.  In 2019, he was diagnosed with squamous cell carcinoma of the lung at [facility] for which he refused treatment.  He has a PMH of diabetes mellitus complicated by PVD, essential hypertension, history of renal calculi, CAD and history of asthma.   Past Medical History:  Diagnosis Date  .  Abdominal aortic aneurysm without rupture ([place])   . Asthma   . Athscl heart disease of native coronary artery w/o ang pctrs   . Atrial fibrillation ([place])   . Chronic obstructive pulmonary disease (COPD) ([place])   . Coronary artery disease   . Diabetes mellitus without complication ([place])   . DM (diabetes mellitus) ([place])   . Esophageal reflux   . History of kidney stones   . Hypertension   . 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 Neuropathy   . Nicotine dependence   . OSA (obstructive sleep apnea)   . Osteoarthrosis   . Other fatigue   . Pain in left knee   . Pain in left shoulder   . Partial edentulism   . Peripheral vascular disease ([place])   . Post traumatic stress disorder   . Pure hypercholesterolemia   . Respirator dependence, status ([place])   . RLS (restless legs syndrome)   . Stricture of artery [facility])    Past Surgical History:  Procedure Laterality Date  . AORTOGRAM  03/06/2020   Procedure: AORTOGRAM WITH BILATERAL ILIAC ARTERIOGRAM;  Surgeon: Marty Heck, MD;  Location: [facility];  Service: Vascular;;  . CARDIAC CATHETERIZATION N/A 07/05/2016   Procedure: Left Heart Cath and Coronary Angiography;  Surgeon: Sherren Mocha, MD;  Location: [facility] CV LAB;  Service: Cardiovascular;  Laterality: N/A;  . CORONARY ANGIOPLASTY    . EMBOLECTOMY Bilateral 03/06/2020   Procedure: THROMBECTOMY OF RIGHT LIMB OF BIFURCATED AORTO-BI-ILIAC STENT GRAFT; RIGHT LOWER EXTREMITY THROMBECTOMY USING THE FEMORAL APPROACH; REDO EXPOSURE LEFT COMMON FEMORAL ARTERY; THROMBECTOMY LEFT LIMB OF  AORTO-BI-ILIAC STENT GRAFT;  LEFT LOWER EXTREMITY THROMBECTOMY USING THE FEMORAL APPROACH;  Surgeon: Marty Heck, MD;  Location: [facility];  Service: Vascular;  Laterality: Bilateral;  . ENDOVASCULAR STENT INSERTION N/A 01/15/2020   Procedure: ENDOVASCULAR ANEURYSM REPAIR;  Surgeon: Waynetta Sandy, MD;  Location: [facility];  Service: Vascular;  Laterality: N/A;  . FASCIOTOMY Right 03/06/2020    Procedure: RIGHT LOWER EXTREMITY FOUR COMPARTMENT FASCIOTOMIES;  Surgeon: Marty Heck, MD;  Location: [facility];  Service: Vascular;  Laterality: Right;  . FASCIOTOMY CLOSURE Right 03/11/2020   Procedure: RIGHT LOWER EXTREMITY FASCIOTOMY WASHOUT AND CLOSURE;  Surgeon: Waynetta Sandy, MD;  Location: [facility];  Service: Vascular;  Laterality: Right;  . heart stents  2006  . INSERTION OF ILIAC STENT Left 01/15/2020   Procedure: INSERTION OF LEFT EXTERNAL ILIAC ARTERY STENT;  Surgeon: Waynetta Sandy, MD;  Location: [facility];  Service: Vascular;  Laterality: Left;  . LOWER EXTREMITY ANGIOGRAM  01/15/2020   Procedure: Left Lower Extremity Angiogram;  Surgeon: Waynetta Sandy, MD;  Location: [facility];  Service: Vascular;;  . TEE WITHOUT CARDIOVERSION  03/11/2020   Procedure: TRANSESOPHAGEAL ECHOCARDIOGRAM (TEE);  Surgeon: Waynetta Sandy, MD;  Location: [facility];  Service: Vascular;;  . THROMBECTOMY ILIAC ARTERY  01/15/2020   Procedure: THROMBECTOMY OF LEFT ILIAC ARTERY;  Surgeon: Waynetta Sandy, MD;  Location: [facility];  Service: Vascular;;    Allergies  Allergen Reactions  . Erythromycin Shortness Of Breath and Swelling  . Simvastatin Other (See Comments)    CAUSE MUSCLE PAIN,.  . Statins Other (See Comments)    Muscle pain    Outpatient Encounter Medications as of 04/21/2020  Medication Sig  . acetaminophen (TYLENOL) 325 MG tablet Take 2 tablets (650 mg total) by mouth every 6 (six) hours as needed for mild pain (or temp >/= 101 F).  Marland Kitchen albuterol (PROVENTIL HFA;VENTOLIN HFA) 108 (90 BASE) MCG/ACT inhaler Inhale 2 puffs into the lungs every 6 (six) hours as needed for wheezing or shortness of breath.   . Calcium Carb-Cholecalciferol ([employer] CALCIUM 600 & VITAMIN D3) 600-800 MG-UNIT TABS Take 1 tablet by mouth daily.  . Calcium Carbonate-Simethicone (ALKA-SELTZER HEARTBURN + GAS PO) Take 2 each by mouth every 4 (four) hours as needed.  . clopidogrel (PLAVIX) 75  MG tablet Take 75 mg by mouth daily.  . Cyanocobalamin (VITAMIN B 12 PO) Take 500 mcg by mouth daily.   . diclofenac sodium (VOLTAREN) 1 % GEL Apply 4 g topically 3 (three) times daily as needed (pain).   . DULoxetine (CYMBALTA) 30 MG capsule Take 30 mg by mouth daily.  Marland Kitchen enoxaparin (LOVENOX) 80 MG/0.8ML injection Inject 0.8 mLs (80 mg total) into the skin every 12 (twelve) hours.  . finasteride (PROSCAR) 5 MG tablet Take 5 mg by mouth daily.  . Glucerna (GLUCERNA) LIQD Take 237 mLs by mouth 2 (two) times daily between meals.  . hydroxypropyl methylcellulose / hypromellose (ISOPTO TEARS / GONIOVISC) 2.5 % ophthalmic solution Place 1-2 drops into both eyes in the morning and at bedtime.   Marland Kitchen loperamide (IMODIUM A-D) 2 MG tablet Take 2 mg by mouth 4 (four) times daily as needed for diarrhea or loose stools.  Marland Kitchen loratadine (CLARITIN) 10 MG tablet Take 10 mg by mouth daily.  . metFORMIN (GLUCOPHAGE-XR) 500 MG 24 hr tablet Take 1,000 mg by mouth in the morning and at bedtime.   . nitroGLYCERIN (NITROSTAT) 0.4 MG SL tablet Place 0.4 mg under the tongue every 5 (  five) minutes as needed for chest pain.  . Nutritional Supplement LIQD Take 120 mLs by mouth daily. MedPass   . Nutritional Supplements (NUTRITIONAL SUPPLEMENT PO) Take 1 each by mouth in the morning and at bedtime. Magic Cup  . Olodaterol HCl 2.5 MCG/ACT AERS Inhale 2 puffs into the lungs daily.  . pantoprazole (PROTONIX) 40 MG tablet Take 1 tablet (40 mg total) by mouth daily.  . polyethylene glycol (MIRALAX / GLYCOLAX) 17 g packet Take 17 g by mouth daily as needed for moderate constipation.  . tamsulosin (FLOMAX) 0.4 MG CAPS capsule Take 0.4 mg by mouth at bedtime.   Marland Kitchen umeclidinium bromide (INCRUSE ELLIPTA) 62.5 MCG/INH AEPB Inhale 1 puff into the lungs daily.   No facility-administered encounter medications on file as of 04/21/2020.    Review of Systems  Unable to obtain due to weakness   Immunization History  Administered Date(s)  Administered  . Influenza-Unspecified 05/24/2019  . PFIZER SARS-COV-2 Vaccination 12/06/2019, 12/27/2019  . Pneumococcal Polysaccharide-23 01/22/2020   Pertinent  Health Maintenance Due  Topic Date Due  . OPHTHALMOLOGY EXAM  Never done  . URINE MICROALBUMIN  Never done  . COLONOSCOPY  Never done  . INFLUENZA VACCINE  03/23/2020  . HEMOGLOBIN A1C  07/17/2020  . FOOT EXAM  11/13/2020  . PNA vac Low Risk Adult (2 of 2 - PCV13) 01/21/2021    Vitals:   04/21/20 0938  BP: 124/82  Pulse: 81  Resp: 18  Temp: (!) 97.2 F (36.2 C)  TempSrc: Oral  Weight: 156 lb 12.8 oz (71.1 kg)  Height: 6\' 3"  (1.905 m)   Body mass index is 19.6 kg/m.  Physical Exam  GENERAL APPEARANCE:  Weak with eyes closed SKIN:  Right leg wound with ace wrap MOUTH and THROAT: Lips are without lesions. Oral mucosa is moist and without lesions.  RESPIRATORY: Dyspneic, no wheezing  CARDIAC: Irregular heart rate, no murmur,no extra heart sounds, no edema GI: Abdomen soft, normal BS, no masses, no tenderness NEUROLOGICAL: There is no tremor. Sleepy. PSYCHIATRIC:  Affect and behavior are appropriate  Labs reviewed: Recent Labs    03/10/20 0234 03/10/20 0234 03/11/20 4128 03/11/20 7867 03/12/20 0251 03/12/20 0251 03/13/20 0304 03/13/20 0304 03/15/20 0246 03/17/20 0152 04/16/20 0000  NA 134*   < > 135   < > 136   < > 135   < > 137 135 143  K 4.3   < > 3.7   < > 4.0   < > 3.6   < > 4.0 3.9 3.5  CL 100   < > 102   < > 103   < > 102   < > 104 102 102  CO2 27   < > 24   < > 24   < > 23   < > 23 24 22   GLUCOSE 161*   < > 158*   < > 155*   < > 134*  --  142* 133*  --   BUN 12   < > 11   < > 12   < > 10   < > 10 11 25*  CREATININE 0.95   < > 0.93   < > 1.06   < > 0.99   < > 0.92 1.08 1.0  CALCIUM 8.8*   < > 8.8*   < > 9.0   < > 8.8*   < > 9.0 9.3 9.9  MG 1.9  --  2.0  --  1.8  --   --   --   --   --   --  PHOS 2.7   < > 2.7  --  2.9  --  2.5  --   --   --   --    < > = values in this interval not  displayed.   Recent Labs    12/17/19 1312 12/17/19 1312 01/16/20 1652 03/11/20 0318 04/16/20 0000  AST 19   < > 23 38 22  ALT 12   < > 12 22 9*  ALKPHOS 55   < > 42 50 85  BILITOT 0.8  --  0.7 1.8*  --   PROT 6.8  --  5.0* 5.9*  --   ALBUMIN 3.7   < > 2.6* 2.9* 3.4*   < > = values in this interval not displayed.   Recent Labs    01/16/20 1652 01/17/20 0856 03/06/20 1417 03/06/20 1432 03/16/20 0244 03/16/20 0244 03/17/20 0152 03/19/20 0119 04/16/20 0000  WBC 7.5   < > 9.5   < > 8.3   < > 9.3 10.2 7.9  NEUTROABS 5.1  --  6.6  --   --   --   --   --  5  HGB 10.4*   < > 10.9*   < > 9.0*   < > 9.4* 9.5* 10.5*  HCT 32.4*   < > 35.8*   < > 28.5*   < > 30.2* 29.2* 33*  MCV 89.5   < > 89.7   < > 87.2  --  87.0 86.9  --   PLT 131*   < > 51*   < > 160  --  149* 128*  --    < > = values in this interval not displayed.   Lab Results  Component Value Date   TSH 1.786 07/06/2016   Lab Results  Component Value Date   HGBA1C 8.2 (H) 01/15/2020   Lab Results  Component Value Date   CHOL 124 03/07/2020   HDL 23 (L) 03/07/2020   LDLCALC 87 03/07/2020   TRIG 72 03/07/2020   CHOLHDL 5.4 03/07/2020    Assessment/Plan  1. Neoplasm of upper lobe of left lung -  [facility] transfer to [facility] for comfort care  2. AF (paroxysmal atrial fibrillation) (HCC) -Continue Lovenox and metoprolol tartrate  3. Occlusion of right femorotibial bypass graft, subsequent encounter -  S/P thrombectomy/4 compartment fasciotomy due to tumor embolization from large atrial thrombus/tumor due to a mass eroding into the mediastinum -On 7/23 thrombectomy/ thrombosed biopsy confirms malignancy -Continue Lovenox  4. Benign prostatic hyperplasia, unspecified whether lower urinary tract symptoms present -Continue finasteride and tamsulosin  5. Chronic obstructive pulmonary disease, unspecified COPD type ([place]) -Continue Incruse Ellipta INH and Striverdi Respimat INH   04/22/20 Addendum:  All oral  medications and supplements were discontinued by Dr. Gildardo Cranker, hospice doctor, and was started on PRN Morphine for pain.    I have filled out patient's discharge paperwork.  DME provided:  None  Total discharge time: Greater than 30 minutes Greater than 50% was spent in coordination of care.   Discharge time involved coordination of the discharge process with Education officer, museum and nursing staff.    Durenda Age, DNP, MSN, FNP-BC [facility] [phone] (Monday-Friday 8:00 a.m. - 5:00 p.m.) [phone] (after hours)

## 2020-04-22 DIAGNOSIS — R5381 Other malaise: Secondary | ICD-10-CM | POA: Diagnosis not present

## 2020-04-22 DIAGNOSIS — Z743 Need for continuous supervision: Secondary | ICD-10-CM | POA: Diagnosis not present

## 2020-04-22 DIAGNOSIS — R4182 Altered mental status, unspecified: Secondary | ICD-10-CM | POA: Diagnosis not present

## 2020-04-22 DIAGNOSIS — R279 Unspecified lack of coordination: Secondary | ICD-10-CM | POA: Diagnosis not present

## 2020-04-22 DIAGNOSIS — R52 Pain, unspecified: Secondary | ICD-10-CM | POA: Diagnosis not present

## 2020-05-23 ENCOUNTER — Ambulatory Visit: Payer: Medicare PPO | Admitting: Podiatry

## 2020-05-23 DEATH — deceased

## 2020-07-03 ENCOUNTER — Ambulatory Visit: Payer: Self-pay

## 2020-07-03 ENCOUNTER — Other Ambulatory Visit (HOSPITAL_COMMUNITY): Payer: Medicare PPO

## 2020-07-03 ENCOUNTER — Encounter (HOSPITAL_COMMUNITY): Payer: Medicare PPO

## 2020-07-10 ENCOUNTER — Encounter (HOSPITAL_COMMUNITY): Payer: Medicare PPO

## 2020-07-10 ENCOUNTER — Other Ambulatory Visit (HOSPITAL_COMMUNITY): Payer: Self-pay

## 2020-07-10 ENCOUNTER — Ambulatory Visit: Payer: Self-pay

## 2020-07-10 ENCOUNTER — Encounter (HOSPITAL_COMMUNITY): Payer: Self-pay

## 2020-07-10 ENCOUNTER — Ambulatory Visit: Payer: Medicare PPO

## 2020-07-23 IMAGING — CT CT ANGIO AOBIFEM WO/W CM
1 of 7 series · 4 of 16 positions shown, 5 images · IV contrast (OMNI 350)
Comparison: CTA 01/16/2020 and 01/14/2020

CLINICAL DATA: 74-year-old with sudden onset of right leg weakness.
Abdominal aortic aneurysm repair 1 month ago.

EXAM:
CT ANGIOGRAPHY OF ABDOMINAL AORTA WITH ILIOFEMORAL RUNOFF
TECHNIQUE: Multidetector CT imaging of the abdomen, pelvis and lower
extremities was performed using the standard protocol during bolus
administration of intravenous contrast. Multiplanar CT image
reconstructions and MIPs were obtained to evaluate the vascular
anatomy.
CONTRAST:  100mL OMNIPAQUE IOHEXOL 350 MG/ML SOLN

[Series 5: cta runoff (id) · axial · 0.90mm/px · z∈[-1058,-140]mm · 4 of 512 slices shown, 5 images]
[im 103/512  soft-tissue]
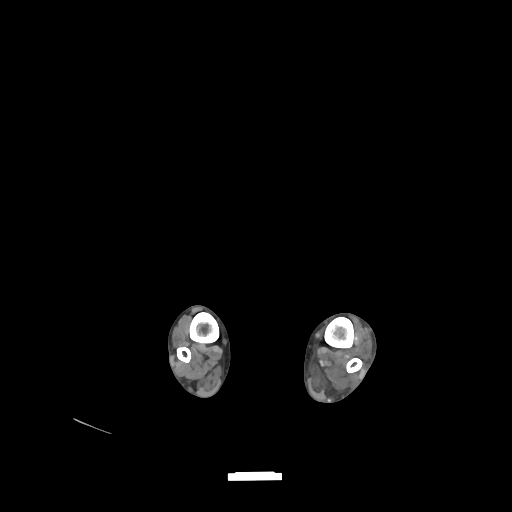
[im 103/512  bone]
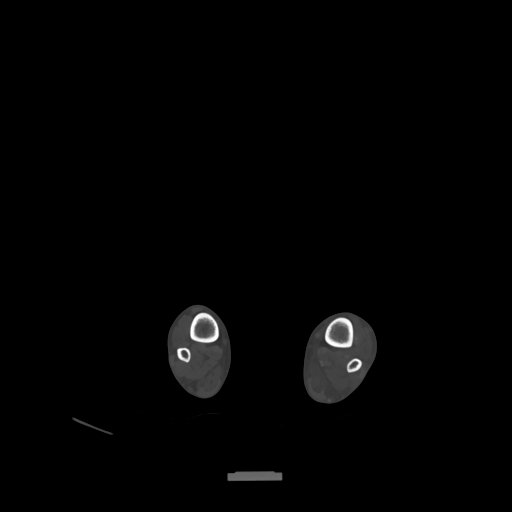
[im 205/512  soft-tissue]
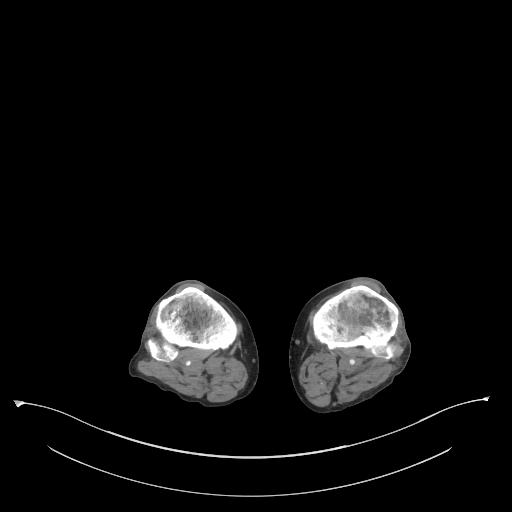
[im 307/512  soft-tissue]
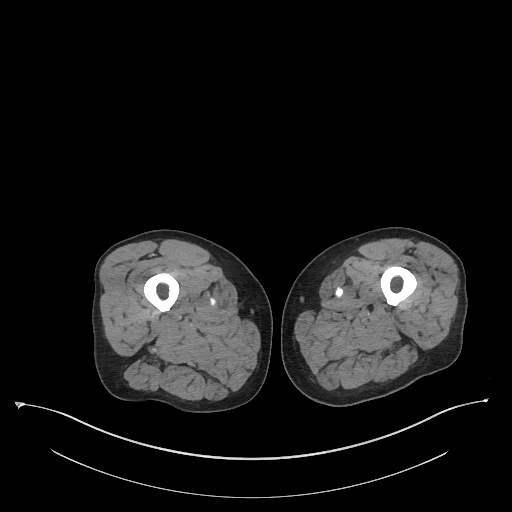
[im 409/512  soft-tissue]
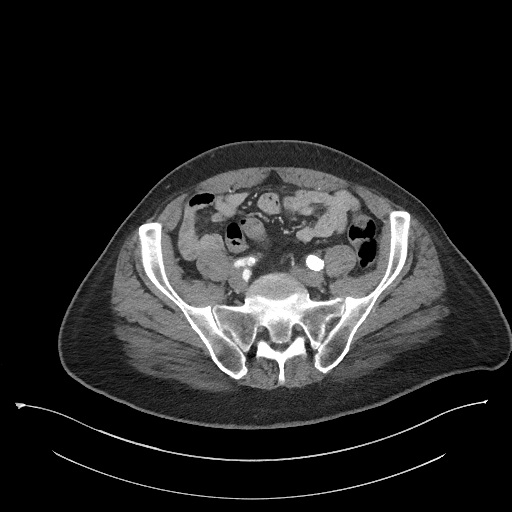

[4 of 16 positions shown; findings below may reference images not displayed]

FINDINGS: VASCULAR

Heart: There is concern for filling defects in the left atrium that
could represent thrombus. This area is incompletely evaluated. This
area of concern is seen on sequence 5, image 1.

Aorta: Normal appearance of the distal descending thoracic aorta.
Again noted is an aortic stent graft in the infrarenal aorta. Main
body of the stent graft is patent but the right limb of the stent
graft is occluded. Left limb of the graft remains patent. The
abdominal aortic aneurysm sac measures 4.8 x 4.4 cm and previously
measured 5.1 x 4.5 cm. No obvious endoleak on this arterial phase of
imaging. No stranding or fluid around the aneurysm sac.

Celiac: Celiac trunk and main branch vessels are patent.

SMA: Mild atherosclerotic disease at the origin without significant
stenosis. Main branches of the SMA are patent.

Renals: Single bilateral renal arteries are patent.

IMA: Origin of the IMA is occluded due to the aneurysm sac. Evidence
for reconstitution beyond the origin.

RIGHT Lower Extremity

Inflow: Right limb of the aortic stent graft is occluded. This
represents a new finding. Chronic stenosis at the origin of the
right internal iliac artery. Right external iliac artery is patent.

Outflow: Flow postsurgical changes in the right groin. The right
common femoral artery has atherosclerotic disease but patent. Right
profunda femoral artery is patent. Right SFA is patent with areas of
at least mild narrowing. Again noted is a short segment narrowing in
the distal right SFA measuring close to 50% stenosis. These findings
are unchanged. Right popliteal artery is patent. Again noted is
focal narrowing in the right popliteal artery just above the knee
joint and this may be related to a chronic dissection in this area.
Degree of stenosis appears to be greater than 50% at the popliteal
artery dissection.

Runoff: Primary runoff is the peroneal artery which is similar to
the previous examination. Small amount of flow in the proximal
anterior tibial artery and there may be decreased flow along the
anterior tibial artery collateral distribution. This may be related
to a small amount of thrombus or slow flow. Posterior tibial
occludes in the mid calf and this is similar to the previous
examination. Previously, there was distal reconstitution of the
posterior tibial artery which is not clearly identified on this
examination.

LEFT Lower Extremity

Inflow: Left aortic stent graft limb is patent. Left common and
external iliac artery stents remain patent.

Outflow: Postsurgical changes around the left common femoral artery.
Left common femoral artery is patent. Left profunda femoral arteries
are patent. Left SFA is patent with mild areas of narrowing. Stable
appearance of the left SFA. Left popliteal artery is patent

Runoff: Again noted is narrowing at the origin of the left anterior
tibial artery. Left anterior tibial artery occludes in the proximal
calf and there may be some areas of segmental reconstitution.
Tibioperoneal trunk is patent. Peroneal artery is patent and there
may be improved flow in the peroneal artery compared to the previous
examination. Previously, there was a segment of the peroneal artery
that was occluded. Posterior tibial artery occludes in the proximal
calf. Evidence for segmental reconstitution of the posterior tibial
artery.

Veins: No obvious venous abnormality within the limitations of this
arterial phase study.

Review of the MIP images confirms the above findings.

NON-VASCULAR

Lower chest: Lung bases are clear without pleural effusions.

Hepatobiliary: Gallbladder is decompressed. Main portal veins are
patent. Mild dilatation of the proximal extrahepatic bile duct
measuring up to 1.3 cm and unchanged. Distal common bile duct
measures roughly 0.5 cm and similar to the previous examination.

Pancreas: Unremarkable. No pancreatic ductal dilatation or
surrounding inflammatory changes.

Spleen: Normal in size without focal abnormality.

Adrenals/Urinary Tract: Normal appearance of the adrenal glands.
Negative for hydronephrosis. Probable small renal cysts. Urinary
bladder is unremarkable.

Stomach/Bowel: Stomach is within normal limits. Appendix appears
normal. No evidence of bowel wall thickening, distention, or
inflammatory changes.

Lymphatic: No significant lymph node enlargement in the abdomen or
pelvis.

Reproductive: Prostate is mildly prominent measuring 4.8 cm in
transverse dimension.

Other: Negative for ascites.  Negative for free air.

Musculoskeletal: No acute bone abnormality.
IMPRESSION: VASCULAR

1. Right limb of the bifurcated aortic stent graft is occluded. This
is a new finding and consistent with patient's right leg symptoms.
2. Concern for filling defects in the left atrium. This could
represent left atrial thrombus and source for embolic disease.

3. Right runoff has slightly changed since 01/14/2020. There is now
single-vessel runoff from the peroneal artery. Previously, there was
flow at the ankle from the posterior tibial artery. The lack of flow
in the distal posterior tibial artery could be related to slow flow
from the occluded stent limb but cannot exclude embolic disease
involving the right runoff vessels.
4. No significant change in the right outflow disease. Right SFA and
right popliteal artery remain patent. There continues to be focal
narrowing in the right popliteal artery possibly related to a
dissection.
5. Left iliac artery stents remain patent. No significant change in
the left outflow or runoff vessels.

NON-VASCULAR

1. No acute abnormality in the abdomen or pelvis.

These results were called by telephone at the time of interpretation
verbally acknowledged these results.
